# Patient Record
Sex: Male | Born: 1960
Health system: Southern US, Community
[De-identification: ages and names within clinical notes are randomized; demographics above are authoritative.]

## PROBLEM LIST (undated history)

## (undated) DIAGNOSIS — G4730 Sleep apnea, unspecified: Secondary | ICD-10-CM

## (undated) DIAGNOSIS — E559 Vitamin D deficiency, unspecified: Secondary | ICD-10-CM

## (undated) DIAGNOSIS — M1990 Unspecified osteoarthritis, unspecified site: Secondary | ICD-10-CM

## (undated) DIAGNOSIS — I10 Essential (primary) hypertension: Secondary | ICD-10-CM

## (undated) DIAGNOSIS — A770 Spotted fever due to Rickettsia rickettsii: Secondary | ICD-10-CM

## (undated) DIAGNOSIS — Z7982 Long term (current) use of aspirin: Secondary | ICD-10-CM

## (undated) DIAGNOSIS — S060XAA Concussion with loss of consciousness status unknown, initial encounter: Secondary | ICD-10-CM

## (undated) DIAGNOSIS — E538 Deficiency of other specified B group vitamins: Secondary | ICD-10-CM

## (undated) DIAGNOSIS — F419 Anxiety disorder, unspecified: Secondary | ICD-10-CM

## (undated) DIAGNOSIS — G43809 Other migraine, not intractable, without status migrainosus: Secondary | ICD-10-CM

## (undated) DIAGNOSIS — F909 Attention-deficit hyperactivity disorder, unspecified type: Secondary | ICD-10-CM

## (undated) DIAGNOSIS — I639 Cerebral infarction, unspecified: Secondary | ICD-10-CM

## (undated) DIAGNOSIS — M4800 Spinal stenosis, site unspecified: Secondary | ICD-10-CM

## (undated) DIAGNOSIS — K219 Gastro-esophageal reflux disease without esophagitis: Secondary | ICD-10-CM

## (undated) DIAGNOSIS — D510 Vitamin B12 deficiency anemia due to intrinsic factor deficiency: Secondary | ICD-10-CM

## (undated) DIAGNOSIS — I6381 Other cerebral infarction due to occlusion or stenosis of small artery: Secondary | ICD-10-CM

## (undated) DIAGNOSIS — I341 Nonrheumatic mitral (valve) prolapse: Secondary | ICD-10-CM

## (undated) HISTORY — DX: Other migraine, not intractable, without status migrainosus: G43.809

## (undated) HISTORY — DX: Attention-deficit hyperactivity disorder, unspecified type: F90.9

## (undated) HISTORY — PX: COLONOSCOPY: SHX174

## (undated) HISTORY — DX: Vitamin B12 deficiency anemia due to intrinsic factor deficiency: D51.0

## (undated) HISTORY — PX: REPAIR KNEE LIGAMENT: SUR1188

## (undated) HISTORY — PX: TRIGGER FINGER RELEASE: SHX641

## (undated) HISTORY — DX: Other cerebral infarction due to occlusion or stenosis of small artery: I63.81

## (undated) HISTORY — PX: BILATERAL CARPAL TUNNEL RELEASE: SHX6508

## (undated) HISTORY — DX: Concussion with loss of consciousness status unknown, initial encounter: S06.0XAA

## (undated) HISTORY — DX: Unspecified osteoarthritis, unspecified site: M19.90

## (undated) HISTORY — DX: Deficiency of other specified B group vitamins: E53.8

## (undated) HISTORY — DX: Vitamin D deficiency, unspecified: E55.9

## (undated) HISTORY — DX: Essential (primary) hypertension: I10

## (undated) HISTORY — PX: LEG SURGERY: SHX1003

---
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
## 1982-08-20 HISTORY — PX: FRACTURE SURGERY: SHX138

## 1982-08-20 HISTORY — PX: OTHER SURGICAL HISTORY: SHX169

## 2007-04-04 DIAGNOSIS — R030 Elevated blood-pressure reading, without diagnosis of hypertension: Secondary | ICD-10-CM | POA: Insufficient documentation

## 2007-04-08 ENCOUNTER — Ambulatory Visit: Payer: Self-pay | Admitting: Family Medicine

## 2008-08-30 ENCOUNTER — Ambulatory Visit: Payer: Self-pay | Admitting: Family Medicine

## 2008-08-30 DIAGNOSIS — G5600 Carpal tunnel syndrome, unspecified upper limb: Secondary | ICD-10-CM | POA: Insufficient documentation

## 2008-09-22 ENCOUNTER — Encounter: Payer: Self-pay | Admitting: Family Medicine

## 2008-10-18 ENCOUNTER — Encounter: Payer: Self-pay | Admitting: Family Medicine

## 2009-03-08 ENCOUNTER — Ambulatory Visit: Payer: Self-pay | Admitting: Orthopedic Surgery

## 2009-04-28 DIAGNOSIS — M2550 Pain in unspecified joint: Secondary | ICD-10-CM | POA: Insufficient documentation

## 2009-08-15 ENCOUNTER — Encounter: Payer: Self-pay | Admitting: Orthopedic Surgery

## 2009-08-20 ENCOUNTER — Encounter: Payer: Self-pay | Admitting: Orthopedic Surgery

## 2009-09-20 ENCOUNTER — Encounter: Payer: Self-pay | Admitting: Orthopedic Surgery

## 2009-10-18 ENCOUNTER — Encounter: Payer: Self-pay | Admitting: Orthopedic Surgery

## 2009-11-18 ENCOUNTER — Encounter: Payer: Self-pay | Admitting: Orthopedic Surgery

## 2009-12-28 ENCOUNTER — Ambulatory Visit: Payer: Self-pay | Admitting: Orthopedic Surgery

## 2009-12-29 ENCOUNTER — Ambulatory Visit: Payer: Self-pay | Admitting: Orthopedic Surgery

## 2010-02-01 ENCOUNTER — Encounter: Payer: Self-pay | Admitting: Physician Assistant

## 2010-02-17 ENCOUNTER — Encounter: Payer: Self-pay | Admitting: Physician Assistant

## 2010-03-20 ENCOUNTER — Encounter: Payer: Self-pay | Admitting: Physician Assistant

## 2010-04-06 ENCOUNTER — Ambulatory Visit: Payer: Self-pay | Admitting: Orthopedic Surgery

## 2010-04-20 ENCOUNTER — Encounter: Payer: Self-pay | Admitting: Physician Assistant

## 2011-11-13 ENCOUNTER — Emergency Department: Payer: Self-pay | Admitting: Emergency Medicine

## 2011-11-13 LAB — COMPREHENSIVE METABOLIC PANEL
Albumin: 4.5 g/dL (ref 3.4–5.0)
Anion Gap: 10 (ref 7–16)
Calcium, Total: 8.8 mg/dL (ref 8.5–10.1)
EGFR (Non-African Amer.): >60
Glucose: 98 mg/dL (ref 65–99)
Osmolality: 282 (ref 275–301)
Potassium: 4.1 mmol/L (ref 3.5–5.1)
SGPT (ALT): 27 U/L
Total Protein: 7.6 g/dL (ref 6.4–8.2)

## 2011-11-13 LAB — TROPONIN I: Troponin-I: <0.02 ng/mL

## 2011-11-13 LAB — CBC
HCT: 45.6 % (ref 40.0–52.0)
MCHC: 34.3 g/dL (ref 32.0–36.0)
RDW: 13.4 % (ref 11.5–14.5)
WBC: 7.3 10*3/uL (ref 3.8–10.6)

## 2012-09-22 ENCOUNTER — Emergency Department: Payer: Self-pay | Admitting: Emergency Medicine

## 2013-10-19 ENCOUNTER — Ambulatory Visit: Payer: Self-pay | Admitting: Family Medicine

## 2013-11-18 ENCOUNTER — Ambulatory Visit: Payer: Self-pay | Admitting: Otolaryngology

## 2014-01-05 DIAGNOSIS — G43109 Migraine with aura, not intractable, without status migrainosus: Secondary | ICD-10-CM | POA: Insufficient documentation

## 2014-03-01 ENCOUNTER — Ambulatory Visit: Payer: Self-pay | Admitting: Gastroenterology

## 2014-03-01 LAB — HM COLONOSCOPY

## 2015-04-08 ENCOUNTER — Ambulatory Visit: Payer: Self-pay | Admitting: Family Medicine

## 2016-08-28 ENCOUNTER — Ambulatory Visit (INDEPENDENT_AMBULATORY_CARE_PROVIDER_SITE_OTHER): Payer: BLUE CROSS/BLUE SHIELD | Admitting: Family Medicine

## 2016-08-28 ENCOUNTER — Encounter: Payer: Self-pay | Admitting: Family Medicine

## 2016-08-28 VITALS — BP 122/82 | HR 100 | Temp 98.6°F | Resp 16 | Wt 165.6 lb

## 2016-08-28 DIAGNOSIS — J111 Influenza due to unidentified influenza virus with other respiratory manifestations: Secondary | ICD-10-CM

## 2016-08-28 LAB — POC INFLUENZA A&B (BINAX/QUICKVUE)
INFLUENZA A, POC: POSITIVE — AB
INFLUENZA B, POC: NEGATIVE

## 2016-08-28 MED ORDER — HYDROCODONE-HOMATROPINE 5-1.5 MG/5ML PO SYRP: ORAL_SOLUTION | ORAL | 0 refills | Status: DC

## 2016-08-28 MED ORDER — OSELTAMIVIR PHOSPHATE 75 MG PO CAPS: 75.0000 mg | ORAL_CAPSULE | Freq: Two times a day (BID) | ORAL | 0 refills | Status: DC

## 2016-08-28 NOTE — Progress Notes (Signed)
Subjective:     Patient ID: SAHWN BODDEN, male   DOB: 1960/11/03, 56 y.o.   MRN: [id number]  HPI  Chief Complaint  Patient presents with  . Cough    Patient comes in office today with complaints of cough, congestion, body ache and fever for the past 24 hrs. Patients wife who is present with the patient states that patient had fever last night high of 102.1 and was disoriented and had difficulty with speech. Patient has been taking otc Ibuprofen and Claritin D since last night.   States prior to current sx he and his wife had mild cold sx. Sinus drainage is clear and cough is now non-productive. Did not have the flu shot this season   Review of Systems     Objective:   Physical Exam  Constitutional: He appears well-developed and well-nourished. He has a sickly appearance. No distress.  Ears: T.M's intact without inflammation Throat: no tonsillar enlargement or exudate Neck: no cervical adenopathy Lungs: clear     Assessment:    1. Flu - oseltamivir (TAMIFLU) 75 MG capsule; Take 1 capsule (75 mg total) by mouth 2 (two) times daily.  Dispense: 10 capsule; Refill: 0 - HYDROcodone-homatropine (HYCODAN) 5-1.5 MG/5ML syrup; 5 ml 4-6 hours as needed for cough  Dispense: 240 mL; Refill: 0 - POC Influenza A&B(BINAX/QUICKVUE)    Plan:    May use Mucinex D for congestion. Tamiflu in prevention dose also prescribed for his wife as she is a patient here.

## 2016-08-28 NOTE — Patient Instructions (Signed)
May use Mucinex D for congestion

## 2016-09-26 ENCOUNTER — Ambulatory Visit (INDEPENDENT_AMBULATORY_CARE_PROVIDER_SITE_OTHER): Payer: BLUE CROSS/BLUE SHIELD | Admitting: Physician Assistant

## 2016-09-26 ENCOUNTER — Ambulatory Visit
Admission: RE | Admit: 2016-09-26 | Discharge: 2016-09-26 | Disposition: A | Discharge status: Home / Self Care | Payer: BLUE CROSS/BLUE SHIELD | Source: Ambulatory Visit | Attending: Physician Assistant | Admitting: Physician Assistant

## 2016-09-26 VITALS — BP 170/80 | HR 94 | Temp 98.0°F | Resp 16 | Wt 170.0 lb

## 2016-09-26 DIAGNOSIS — R05 Cough: Secondary | ICD-10-CM

## 2016-09-26 DIAGNOSIS — R059 Cough, unspecified: Secondary | ICD-10-CM

## 2016-09-26 NOTE — Progress Notes (Signed)
[name]  MRN: [id number] DOB: Nov 10, 1960  Subjective:  HPI   The patient is a 56 year old male who presents with continued symptoms of cough and congestion.  He states that he has had these symptoms since Christmas.  During this time he also had the flu and was treated with Tamiflu.  He states he ha been using Claritin D and Mucinex.  He states he has been using a lot of the Claritin D.   He has not been running any fever but is a little winded. Wants to make sure he is not infectious towards other people.  Patient Active Problem List   Diagnosis Date Noted  . Migraine with vertigo 01/05/2014  . Arthralgia of multiple joints 04/28/2009  . Carpal tunnel syndrome 08/30/2008  . Blood pressure elevated without history of HTN 04/04/2007    No past medical history on file.  Social History   Social History  . Marital status: Married    Spouse name: N/A  . Number of children: N/A  . Years of education: N/A   Occupational History  . Not on file.   Social History Main Topics  . Smoking status: Never Smoker  . Smokeless tobacco: Never Used  . Alcohol use Not on file  . Drug use: Unknown  . Sexual activity: Not on file   Other Topics Concern  . Not on file   Social History Narrative  . No narrative on file    Outpatient Encounter Prescriptions as of 09/26/2016  Medication Sig Note  . [DISCONTINUED] HYDROcodone-homatropine (HYCODAN) 5-1.5 MG/5ML syrup 5 ml 4-6 hours as needed for cough   . [DISCONTINUED] ibuprofen (ADVIL,MOTRIN) 200 MG tablet Take by mouth. 08/28/2016: Received from: [facility]: Take 200 mg by mouth every 6 (six) hours as needed for Pain.  . [DISCONTINUED] oseltamivir (TAMIFLU) 75 MG capsule Take 1 capsule (75 mg total) by mouth 2 (two) times daily.    No facility-administered encounter medications on file as of 09/26/2016.     Not on File  Review of Systems  Constitutional: Positive for malaise/fatigue. Negative for  chills and fever.  HENT: Positive for congestion. Negative for ear discharge, ear pain, hearing loss, nosebleeds, sinus pain, sore throat and tinnitus.   Eyes: Negative for blurred vision, double vision, photophobia, pain, discharge and redness.       Eye dryness  Respiratory: Positive for cough, sputum production (clear) and shortness of breath. Negative for hemoptysis and wheezing.   Cardiovascular: Negative for chest pain, palpitations, orthopnea and leg swelling.  Gastrointestinal: Positive for constipation. Negative for abdominal pain, blood in stool, diarrhea, heartburn, melena, nausea and vomiting.  Musculoskeletal: Negative for joint pain and myalgias.  Neurological: Negative for dizziness, weakness and headaches.    Objective:  BP (!) 170/80 (BP Location: Right Arm, Patient Position: Sitting, Cuff Size: Normal)   Pulse 94   Temp 98 F (36.7 C) (Oral)   Resp 16   Wt 170 lb (77.1 kg)   SpO2 98%   Physical Exam  Constitutional: He is well-developed, well-nourished, and in no distress.  HENT:  Right Ear: Tympanic membrane and external ear normal.  Left Ear: Tympanic membrane and external ear normal.  Mouth/Throat: Oropharynx is clear and moist. No oropharyngeal exudate.  Eyes: Conjunctivae are normal. Right eye exhibits no discharge. Left eye exhibits no discharge.  Neck: Neck supple.  Cardiovascular: Normal rate, regular rhythm and normal heart sounds.  Exam reveals no friction rub.   No  murmur heard. Pulmonary/Chest: Effort normal and breath sounds normal. No respiratory distress. He has no wheezes.  Lymphadenopathy:    He has no cervical adenopathy.  Neurological: He is alert.  Skin: Skin is warm and dry.  Psychiatric: Affect and judgment normal.    Assessment and Plan :   1. Cough  Do think it's likely postinfectious, 2/2 length of symptoms will get CXR to evaluate for acute infections. Otherwise, blood pressure is high today. Please discontinue Claritin D and use  just Claritin/Mucinex if this has been helping. Plenty of fluids. May use Tessalon perles PRN.  - DG Chest 2 View; Future  Return if symptoms worsen or fail to improve.   Patient Instructions  Upper Respiratory Infection, Adult Most upper respiratory infections (URIs) are caused by a virus. A URI affects the nose, throat, and upper air passages. The most common type of URI is often called "the common cold." Follow these instructions at home:  Take medicines only as told by your doctor.  Gargle warm saltwater or take cough drops to comfort your throat as told by your doctor.  Use a warm mist humidifier or inhale steam from a shower to increase air moisture. This may make it easier to breathe.  Drink enough fluid to keep your pee (urine) clear or pale yellow.  Eat soups and other clear broths.  Have a healthy diet.  Rest as needed.  Go back to work when your fever is gone or your doctor says it is okay.  You may need to stay home longer to avoid giving your URI to others.  You can also wear a face mask and wash your hands often to prevent spread of the virus.  Use your inhaler more if you have asthma.  Do not use any tobacco products, including cigarettes, chewing tobacco, or electronic cigarettes. If you need help quitting, ask your doctor. Contact a doctor if:  You are getting worse, not better.  Your symptoms are not helped by medicine.  You have chills.  You are getting more short of breath.  You have brown or red mucus.  You have yellow or brown discharge from your nose.  You have pain in your face, especially when you bend forward.  You have a fever.  You have puffy (swollen) neck glands.  You have pain while swallowing.  You have white areas in the back of your throat. Get help right away if:  You have very bad or constant:  Headache.  Ear pain.  Pain in your forehead, behind your eyes, and over your cheekbones (sinus pain).  Chest pain.  You  have long-lasting (chronic) lung disease and any of the following:  Wheezing.  Long-lasting cough.  Coughing up blood.  A change in your usual mucus.  You have a stiff neck.  You have changes in your:  Vision.  Hearing.  Thinking.  Mood. This information is not intended to replace advice given to you by your health care provider. Make sure you discuss any questions you have with your health care provider. Document Released: 01/23/2008 Document Revised: 04/08/2016 Document Reviewed: 11/11/2013 Elsevier Interactive Patient Education  2017 [employer].    The entirety of the information documented in the History of Present Illness, Review of Systems and Physical Exam were personally obtained by me. Portions of this information were initially documented by [facility] and reviewed by me for thoroughness and accuracy.

## 2016-09-26 NOTE — Patient Instructions (Signed)
Upper Respiratory Infection, Adult Most upper respiratory infections (URIs) are caused by a virus. A URI affects the nose, throat, and upper air passages. The most common type of URI is often called "the common cold." Follow these instructions at home:  Take medicines only as told by your doctor.  Gargle warm saltwater or take cough drops to comfort your throat as told by your doctor.  Use a warm mist humidifier or inhale steam from a shower to increase air moisture. This may make it easier to breathe.  Drink enough fluid to keep your pee (urine) clear or pale yellow.  Eat soups and other clear broths.  Have a healthy diet.  Rest as needed.  Go back to work when your fever is gone or your doctor says it is okay.  You may need to stay home longer to avoid giving your URI to others.  You can also wear a face mask and wash your hands often to prevent spread of the virus.  Use your inhaler more if you have asthma.  Do not use any tobacco products, including cigarettes, chewing tobacco, or electronic cigarettes. If you need help quitting, ask your doctor. Contact a doctor if:  You are getting worse, not better.  Your symptoms are not helped by medicine.  You have chills.  You are getting more short of breath.  You have brown or red mucus.  You have yellow or brown discharge from your nose.  You have pain in your face, especially when you bend forward.  You have a fever.  You have puffy (swollen) neck glands.  You have pain while swallowing.  You have white areas in the back of your throat. Get help right away if:  You have very bad or constant:  Headache.  Ear pain.  Pain in your forehead, behind your eyes, and over your cheekbones (sinus pain).  Chest pain.  You have long-lasting (chronic) lung disease and any of the following:  Wheezing.  Long-lasting cough.  Coughing up blood.  A change in your usual mucus.  You have a stiff neck.  You have  changes in your:  Vision.  Hearing.  Thinking.  Mood. This information is not intended to replace advice given to you by your health care provider. Make sure you discuss any questions you have with your health care provider. Document Released: 01/23/2008 Document Revised: 04/08/2016 Document Reviewed: 11/11/2013 Elsevier Interactive Patient Education  2017 [employer] Inc.  

## 2016-09-27 ENCOUNTER — Telehealth: Payer: Self-pay

## 2016-09-27 NOTE — Telephone Encounter (Signed)
lmtcb Emily Drozdowski, CMA  

## 2016-09-27 NOTE — Telephone Encounter (Signed)
-----   Message from Trinna Post, [place] sent at 09/27/2016  8:25 AM EST ----- Normal CXR. Please stop taking Claritin D or any decongestant.

## 2016-09-27 NOTE — Telephone Encounter (Signed)
Pt advised. [name], CMA  

## 2016-10-24 ENCOUNTER — Ambulatory Visit (INDEPENDENT_AMBULATORY_CARE_PROVIDER_SITE_OTHER): Payer: BLUE CROSS/BLUE SHIELD | Admitting: Physician Assistant

## 2016-10-24 ENCOUNTER — Encounter: Payer: Self-pay | Admitting: Physician Assistant

## 2016-10-24 VITALS — BP 138/72 | HR 68 | Temp 98.4°F | Resp 16 | Ht 68.0 in | Wt 168.0 lb

## 2016-10-24 DIAGNOSIS — Z1211 Encounter for screening for malignant neoplasm of colon: Secondary | ICD-10-CM

## 2016-10-24 DIAGNOSIS — Z0000 Encounter for general adult medical examination without abnormal findings: Secondary | ICD-10-CM

## 2016-10-24 DIAGNOSIS — R030 Elevated blood-pressure reading, without diagnosis of hypertension: Secondary | ICD-10-CM

## 2016-10-24 NOTE — Progress Notes (Addendum)
Patient: [name], Male    DOB: [birth year], 56 y.o.   MRN: [id number] Visit Date: 10/24/2016  Today's Provider: Trinna Post, PA-C   Chief Complaint  Patient presents with  . Annual Exam   Subjective:    Annual physical exam [name] is a 56 y.o. male who presents today for health maintenance and complete physical. He feels fairly well. He reports exercising some. He reports he is sleeping well.  He lives in [place] with his wife and his daughter, who is aged 9.  He has some c/o URI congestion which he think may be allergies. Also has some dryness and blury eyes at times that resolve with sleep. Reports history of valvular prolapse, unsure which valve. Says he has sensation of "fatigue" once a month where he feels like his chest and his head are not connected appropriately, and that his head is not getting enough blood supply. This usually clears in a short while.   Has a history of ocular migraines which have resolved.  Has family history of colon polyps in his father, not sure if benign. Has no family hx of prostate cancer. Had colonoscopy in July 2015 with benign 9 mm polyp.  Has had a leg lengthening surgery remotely following traumatic incidents.   ----------------------------------------------------------------- Review of Systems  Constitutional: Negative.   HENT: Positive for congestion and sneezing.   Eyes: Positive for pain. Negative for photophobia and visual disturbance.  Respiratory: Positive for chest tightness.   Cardiovascular: Negative.   Gastrointestinal: Negative.   Endocrine: Negative.   Genitourinary: Negative.   Musculoskeletal: Positive for arthralgias, gait problem and neck pain.  Skin: Negative.   Allergic/Immunologic: Negative.   Hematological: Negative.   Psychiatric/Behavioral: Negative.     Social History      He  reports that he has never smoked. He has never used smokeless tobacco. He reports that he drinks  about 0.6 oz of alcohol per week . He reports that he does not use drugs.       Social History   Social History  . Marital status: Married    Spouse name: N/A  . Number of children: N/A  . Years of education: N/A   Social History Main Topics  . Smoking status: Never Smoker  . Smokeless tobacco: Never Used  . Alcohol use 0.6 oz/week    1 Cans of beer per week  . Drug use: No  . Sexual activity: Not Asked   Other Topics Concern  . None   Social History Narrative  . None    No past medical history on file.   Patient Active Problem List   Diagnosis Date Noted  . Migraine with vertigo 01/05/2014  . Arthralgia of multiple joints 04/28/2009  . Carpal tunnel syndrome 08/30/2008  . Blood pressure elevated without history of HTN 04/04/2007    Past Surgical History:  Procedure Laterality Date  . LEG SURGERY      Family History        Family Status  Relation Status  . Mother Alive, age 81y  . Father Deceased at age 76  . Sister Alive  . Sister Alive        His family history includes Breast cancer in his mother; CVA in his father; Heart attack in his sister; Hypertension in his sister; Lymphoma in his father; Thyroid cancer in his mother.     No Known Allergies  No current outpatient prescriptions on file.  Patient Care Team: Trinna Post, PA-C as PCP - General (Physician Assistant)      Objective:   Vitals: BP 138/72 (BP Location: Left Arm, Patient Position: Sitting, Cuff Size: Normal)   Pulse 68   Temp 98.4 F (36.9 C) (Oral)   Resp 16   Ht 5\' 8"  (1.727 m)   Wt 168 lb (76.2 kg)   BMI 25.54 kg/m    Vitals:   10/24/16 1020  BP: 138/72  Pulse: 68  Resp: 16  Temp: 98.4 F (36.9 C)  TempSrc: Oral  Weight: 168 lb (76.2 kg)  Height: 5\' 8"  (1.727 m)     Physical Exam  Constitutional: He appears well-developed and well-nourished.  HENT:  Right Ear: External ear normal.  Mouth/Throat: Oropharynx is clear and moist. No oropharyngeal exudate.      Depression Screen PHQ 2/9 Scores 10/24/2016  PHQ - 2 Score 0  PHQ- 9 Score 0      Assessment & Plan:     Routine Health Maintenance and Physical Exam  Exercise Activities and Dietary recommendations Goals    None       There is no immunization history on file for this patient.  Health Maintenance  Topic Date Due  . Hepatitis C Screening  [birth year]  . HIV Screening  06/11/1975  . TETANUS/TDAP  06/11/1979  . COLONOSCOPY  06/10/2010  . INFLUENZA VACCINE  03/20/2016     Discussed health benefits of physical activity, and encouraged him to engage in regular exercise appropriate for his age and condition.    1. Annual physical exam  Labs as below. Declines PSA today. Counseled about risks/benefits of screening. Offered referral to cardiology for faintness feeling/hx of valve prolapse, patient defers this now. Due for Td in October. May take Allegra daily for allergies.  - CBC with Differential/Platelet - Comprehensive metabolic panel - Lipid panel  2. Colon cancer screening  9 mm benign polyp on colonoscopy, repeat in 10 years.  3. Blood pressure elevated without history of HTN  BP elevated today, not in other recent visits. Counseled about diet, exercise, weight loss.  The entirety of the information documented in the History of Present Illness, Review of Systems and Physical Exam were personally obtained by me. Portions of this information were initially documented by Ashley Royalty, CMA and reviewed by me for thoroughness and accuracy.   Return in about 1 year (around 10/24/2017) for CPE; td in October nursing visit .   --------------------------------------------------------------------    Trinna Post, PA-C  [facility]

## 2016-10-24 NOTE — Patient Instructions (Signed)
 Health Maintenance, Male A healthy lifestyle and preventive care is important for your health and wellness. Ask your health care provider about what schedule of regular examinations is right for you. What should I know about weight and diet?  Eat a Healthy Diet  Eat plenty of vegetables, fruits, whole grains, low-fat dairy products, and lean protein.  Do not eat a lot of foods high in solid fats, added sugars, or salt. Maintain a Healthy Weight  Regular exercise can help you achieve or maintain a healthy weight. You should:  Do at least 150 minutes of exercise each week. The exercise should increase your heart rate and make you sweat (moderate-intensity exercise).  Do strength-training exercises at least twice a week. Watch Your Levels of Cholesterol and Blood Lipids  Have your blood tested for lipids and cholesterol every 5 years starting at 57 years of age. If you are at high risk for heart disease, you should start having your blood tested when you are 57 years old. You may need to have your cholesterol levels checked more often if:  Your lipid or cholesterol levels are high.  You are older than 57 years of age.  You are at high risk for heart disease. What should I know about cancer screening? Many types of cancers can be detected early and may often be prevented. Lung Cancer  You should be screened every year for lung cancer if:  You are a current smoker who has smoked for at least 30 years.  You are a former smoker who has quit within the past 15 years.  Talk to your health care provider about your screening options, when you should start screening, and how often you should be screened. Colorectal Cancer  Routine colorectal cancer screening usually begins at 57 years of age and should be repeated every 5-10 years until you are 57 years old. You may need to be screened more often if early forms of precancerous polyps or small growths are found. Your health care provider  may recommend screening at an earlier age if you have risk factors for colon cancer.  Your health care provider may recommend using home test kits to check for hidden blood in the stool.  A small camera at the end of a tube can be used to examine your colon (sigmoidoscopy or colonoscopy). This checks for the earliest forms of colorectal cancer. Prostate and Testicular Cancer  Depending on your age and overall health, your health care provider may do certain tests to screen for prostate and testicular cancer.  Talk to your health care provider about any symptoms or concerns you have about testicular or prostate cancer. Skin Cancer  Check your skin from head to toe regularly.  Tell your health care provider about any new moles or changes in moles, especially if:  There is a change in a mole's size, shape, or color.  You have a mole that is larger than a pencil eraser.  Always use sunscreen. Apply sunscreen liberally and repeat throughout the day.  Protect yourself by wearing long sleeves, pants, a wide-brimmed hat, and sunglasses when outside. What should I know about heart disease, diabetes, and high blood pressure?  If you are 18-39 years of age, have your blood pressure checked every 3-5 years. If you are 40 years of age or older, have your blood pressure checked every year. You should have your blood pressure measured twice-once when you are at a hospital or clinic, and once when you are not at   a hospital or clinic. Record the average of the two measurements. To check your blood pressure when you are not at a hospital or clinic, you can use:  An automated blood pressure machine at a pharmacy.  A home blood pressure monitor.  Talk to your health care provider about your target blood pressure.  If you are between 45-79 years old, ask your health care provider if you should take aspirin to prevent heart disease.  Have regular diabetes screenings by checking your fasting blood sugar  level.  If you are at a normal weight and have a low risk for diabetes, have this test once every three years after the age of 45.  If you are overweight and have a high risk for diabetes, consider being tested at a younger age or more often.  A one-time screening for abdominal aortic aneurysm (AAA) by ultrasound is recommended for men aged 65-75 years who are current or former smokers. What should I know about preventing infection? Hepatitis B  If you have a higher risk for hepatitis B, you should be screened for this virus. Talk with your health care provider to find out if you are at risk for hepatitis B infection. Hepatitis C  Blood testing is recommended for:  Everyone born from 1945 through 1965.  Anyone with known risk factors for hepatitis C. Sexually Transmitted Diseases (STDs)  You should be screened each year for STDs including gonorrhea and chlamydia if:  You are sexually active and are younger than 57 years of age.  You are older than 57 years of age and your health care provider tells you that you are at risk for this type of infection.  Your sexual activity has changed since you were last screened and you are at an increased risk for chlamydia or gonorrhea. Ask your health care provider if you are at risk.  Talk with your health care provider about whether you are at high risk of being infected with HIV. Your health care provider may recommend a prescription medicine to help prevent HIV infection. What else can I do?  Schedule regular health, dental, and eye exams.  Stay current with your vaccines (immunizations).  Do not use any tobacco products, such as cigarettes, chewing tobacco, and e-cigarettes. If you need help quitting, ask your health care provider.  Limit alcohol intake to no more than 2 drinks per day. One drink equals 12 ounces of beer, 5 ounces of wine, or 1 ounces of hard liquor.  Do not use street drugs.  Do not share needles.  Ask your health  care provider for help if you need support or information about quitting drugs.  Tell your health care provider if you often feel depressed.  Tell your health care provider if you have ever been abused or do not feel safe at home. This information is not intended to replace advice given to you by your health care provider. Make sure you discuss any questions you have with your health care provider. Document Released: 02/02/2008 Document Revised: 04/04/2016 Document Reviewed: 05/10/2015 Elsevier Interactive Patient Education  2017 [employer]vier Inc.  

## 2016-10-25 ENCOUNTER — Telehealth: Payer: Self-pay | Admitting: Physician Assistant

## 2016-10-25 NOTE — Telephone Encounter (Signed)
Pt's wife Manuela Schwartz states that pt had colonoscopy done in 2015 and is not due for a colonoscopy at this time.Report of last colonoscopy printed and sent back to you

## 2016-10-27 LAB — CBC WITH DIFFERENTIAL/PLATELET
Basophils Absolute: 0 10*3/uL (ref 0.0–0.2)
Basos: 0 %
EOS (ABSOLUTE): 0.1 10*3/uL (ref 0.0–0.4)
Eos: 2 %
Hematocrit: 46.4 % (ref 37.5–51.0)
Hemoglobin: 15.7 g/dL (ref 13.0–17.7)
Immature Grans (Abs): 0 10*3/uL (ref 0.0–0.1)
Immature Granulocytes: 0 %
Lymphocytes Absolute: 2.3 10*3/uL (ref 0.7–3.1)
Lymphs: 35 %
MCH: 30.1 pg (ref 26.6–33.0)
MCHC: 33.8 g/dL (ref 31.5–35.7)
MCV: 89 fL (ref 79–97)
Monocytes Absolute: 0.5 10*3/uL (ref 0.1–0.9)
Monocytes: 7 %
Neutrophils Absolute: 3.7 10*3/uL (ref 1.4–7.0)
Neutrophils: 56 %
Platelets: 200 10*3/uL (ref 150–379)
RBC: 5.21 x10E6/uL (ref 4.14–5.80)
RDW: 13.7 % (ref 12.3–15.4)
WBC: 6.6 10*3/uL (ref 3.4–10.8)

## 2016-10-27 LAB — COMPREHENSIVE METABOLIC PANEL
ALT: 16 IU/L (ref 0–44)
AST: 15 IU/L (ref 0–40)
Albumin/Globulin Ratio: 2 (ref 1.2–2.2)
Albumin: 4.4 g/dL (ref 3.5–5.5)
Alkaline Phosphatase: 77 IU/L (ref 39–117)
BUN/Creatinine Ratio: 20 (ref 9–20)
BUN: 17 mg/dL (ref 6–24)
Bilirubin Total: 0.8 mg/dL (ref 0.0–1.2)
CO2: 30 mmol/L — ABNORMAL HIGH (ref 18–29)
Calcium: 9.1 mg/dL (ref 8.7–10.2)
Chloride: 101 mmol/L (ref 96–106)
Creatinine, Ser: 0.84 mg/dL (ref 0.76–1.27)
GFR calc Af Amer: 113 mL/min/{1.73_m2} (ref >59)
GFR calc non Af Amer: 98 mL/min/{1.73_m2} (ref >59)
Globulin, Total: 2.2 g/dL (ref 1.5–4.5)
Glucose: 100 mg/dL — ABNORMAL HIGH (ref 65–99)
Potassium: 4.7 mmol/L (ref 3.5–5.2)
Sodium: 144 mmol/L (ref 134–144)
Total Protein: 6.6 g/dL (ref 6.0–8.5)

## 2016-10-27 LAB — LIPID PANEL
Chol/HDL Ratio: 2.7 ratio units (ref 0.0–5.0)
Cholesterol, Total: 189 mg/dL (ref 100–199)
HDL: 69 mg/dL (ref >39)
LDL Calculated: 97 mg/dL (ref 0–99)
Triglycerides: 117 mg/dL (ref 0–149)
VLDL Cholesterol Cal: 23 mg/dL (ref 5–40)

## 2016-10-30 ENCOUNTER — Telehealth: Payer: Self-pay

## 2016-10-30 ENCOUNTER — Other Ambulatory Visit: Payer: Self-pay | Admitting: Physician Assistant

## 2016-10-30 DIAGNOSIS — R7301 Impaired fasting glucose: Secondary | ICD-10-CM

## 2016-10-30 NOTE — Telephone Encounter (Signed)
Left message to call back  

## 2016-10-30 NOTE — Telephone Encounter (Signed)
Advised patient's wife as below.  

## 2016-10-30 NOTE — Progress Notes (Signed)
A1C in for follow up elevated fasting glucose.

## 2016-10-30 NOTE — Telephone Encounter (Signed)
-----   Message from Trinna Post, [place] sent at 10/30/2016 11:56 AM EDT ----- [name] normal except fasting glucose slightly high, indicating possible prediabetes. Would like to get A1c. Too late to add on, so patient will likely have to come back to lab. Doesn't need to fast for this. Orders in the computer.

## 2016-10-31 ENCOUNTER — Other Ambulatory Visit: Payer: Self-pay

## 2016-10-31 DIAGNOSIS — R7301 Impaired fasting glucose: Secondary | ICD-10-CM

## 2016-11-01 ENCOUNTER — Other Ambulatory Visit: Payer: Self-pay | Admitting: Physician Assistant

## 2016-11-01 DIAGNOSIS — R7303 Prediabetes: Secondary | ICD-10-CM

## 2016-11-01 LAB — HEMOGLOBIN A1C
Est. average glucose Bld gHb Est-mCnc: 117 mg/dL
Hgb A1c MFr Bld: 5.7 % — ABNORMAL HIGH (ref 4.8–5.6)

## 2017-06-05 ENCOUNTER — Ambulatory Visit: Payer: BLUE CROSS/BLUE SHIELD | Admitting: Physician Assistant

## 2017-06-12 ENCOUNTER — Ambulatory Visit (INDEPENDENT_AMBULATORY_CARE_PROVIDER_SITE_OTHER): Payer: BLUE CROSS/BLUE SHIELD | Admitting: Physician Assistant

## 2017-06-12 DIAGNOSIS — Z23 Encounter for immunization: Secondary | ICD-10-CM | POA: Diagnosis not present

## 2017-10-01 ENCOUNTER — Ambulatory Visit: Payer: BLUE CROSS/BLUE SHIELD | Admitting: Physician Assistant

## 2017-10-02 ENCOUNTER — Encounter: Payer: Self-pay | Admitting: Physician Assistant

## 2017-10-02 ENCOUNTER — Ambulatory Visit (INDEPENDENT_AMBULATORY_CARE_PROVIDER_SITE_OTHER): Payer: BLUE CROSS/BLUE SHIELD | Admitting: Physician Assistant

## 2017-10-02 VITALS — BP 144/88 | HR 68 | Temp 98.4°F | Resp 16 | Wt 169.0 lb

## 2017-10-02 DIAGNOSIS — R200 Anesthesia of skin: Secondary | ICD-10-CM

## 2017-10-02 DIAGNOSIS — G5603 Carpal tunnel syndrome, bilateral upper limbs: Secondary | ICD-10-CM | POA: Diagnosis not present

## 2017-10-02 NOTE — Patient Instructions (Signed)
Carpal Tunnel Syndrome Carpal tunnel syndrome is a condition that causes pain in your hand and arm. The carpal tunnel is a narrow area that is on the palm side of your wrist. Repeated wrist motion or certain diseases may cause swelling in the tunnel. This swelling can pinch the main nerve in the wrist (median nerve). Follow these instructions at home: If you have a splint:  Wear it as told by your doctor. Remove it only as told by your doctor.  Loosen the splint if your fingers: ? Become numb and tingle. ? Turn blue and cold.  Keep the splint clean and dry. General instructions  Take over-the-counter and prescription medicines only as told by your doctor.  Rest your wrist from any activity that may be causing your pain. If needed, talk to your employer about changes that can be made in your work, such as getting a wrist pad to use while typing.  If directed, apply ice to the painful area: ? Put ice in a plastic bag. ? Place a towel between your skin and the bag. ? Leave the ice on for 20 minutes, 2-3 times per day.  Keep all follow-up visits as told by your doctor. This is important.  Do any exercises as told by your doctor, physical therapist, or occupational therapist. Contact a doctor if:  You have new symptoms.  Medicine does not help your pain.  Your symptoms get worse. This information is not intended to replace advice given to you by your health care provider. Make sure you discuss any questions you have with your health care provider. Document Released: 07/26/2011 Document Revised: 01/12/2016 Document Reviewed: 12/22/2014 Elsevier Interactive Patient Education  2018 Elsevier Inc.  

## 2017-10-02 NOTE — Progress Notes (Signed)
Patient: [name] Male    DOB: [birth year]   57 y.o.   MRN: [id number] Visit Date: 10/02/2017  Today's Provider: Trinna Post, PA-C   Chief Complaint  Patient presents with  . Wrist Pain    Bilateral worse on the right; history of carpel tunnel,   Subjective:    HPI  [name] is a 57 y/o man with history of bilateral carpal tunnel syndrome presenting today for follow up of the same.   He reports many years ago he was seen by [facility] hand surgeon for numbness in hands and had EMG which showed decreased median nerve conduction, right > left. He was prescribed wrist splints which helped temporarily but do not help anymore. Has tried multiple interventions including different padding and sleeping positions as well as different manipulations all with varying degrees of success.   Today he presents with reduced grip strength in his right hand as well as increasing numbness, difficulty with pincer grasp. He does not desire surgical or procedural management of this issue. Would like to pursue physical therapy.   Also reports longstanding back pain > 10 years in his back that flares on and off. He reports some numbness from his low back and into his legs. He has been able to treat this symptomatically so far.     No Known Allergies  No current outpatient medications on file.  Review of Systems  Constitutional: Negative.   Musculoskeletal: Positive for myalgias. Negative for back pain, gait problem, joint swelling, neck pain and neck stiffness.    Social History   Tobacco Use  . Smoking status: Never Smoker  . Smokeless tobacco: Never Used  Substance Use Topics  . Alcohol use: Yes    Alcohol/week: 0.6 oz    Types: 1 Cans of beer per week   Objective:   BP (!) 144/88 (BP Location: Right Arm, Patient Position: Sitting, Cuff Size: Normal)   Pulse 68   Temp 98.4 F (36.9 C) (Oral)   Resp 16   Wt 169 lb (76.7 kg)   BMI 25.70 kg/m  Vitals:   10/02/17  1116  BP: (!) 144/88  Pulse: 68  Resp: 16  Temp: 98.4 F (36.9 C)  TempSrc: Oral  Weight: 169 lb (76.7 kg)     Physical Exam  Constitutional: He is oriented to person, place, and time. He appears well-developed and well-nourished.  Cardiovascular: Normal rate.  Pulmonary/Chest: Effort normal.  Musculoskeletal: He exhibits no edema, tenderness or deformity.       Right hand: Decreased sensation noted. Decreased sensation is present in the medial distribution. Decreased strength noted.       Left hand: Decreased sensation noted. Decreased sensation is present in the medial distribution. Decreased strength noted. He exhibits thumb/finger opposition.  Thenar eminence atrophy present in right hand.   Neurological: He is alert and oriented to person, place, and time.  Skin: Skin is warm and dry.  Psychiatric: He has a normal mood and affect. His behavior is normal.        Assessment & Plan:     1. Bilateral carpal tunnel syndrome  He does not desire surgical management of this. Can continue with wrist splints, can attend physical therapy. May refer to orthopedics if he desires.  - Ambulatory referral to Physical Therapy  2. Hand numbness  - Ambulatory referral to Physical Therapy  Return if symptoms worsen or fail to improve.  The entirety of the information documented  in the History of Present Illness, Review of Systems and Physical Exam were personally obtained by me. Portions of this information were initially documented by Ashley Royalty, CMA and reviewed by me for thoroughness and accuracy.   I have spent 25 minutes with this patient, >50% of which was spent on counseling and coordination of care.       Trinna Post, PA-C  [facility]

## 2017-10-14 ENCOUNTER — Ambulatory Visit: Payer: BLUE CROSS/BLUE SHIELD | Admitting: Occupational Therapy

## 2017-12-11 ENCOUNTER — Telehealth: Payer: Self-pay | Admitting: Physician Assistant

## 2017-12-11 NOTE — Telephone Encounter (Signed)
Patient had seen you in Feb. For hand numbness and was advised to see a physical therapist.  He has been seeing [facility].   However, he is not much better and would like to have an orthopedic referral.   He said to please get his records from [facility] as he has signed a release for you.

## 2017-12-12 ENCOUNTER — Other Ambulatory Visit: Payer: Self-pay | Admitting: Physician Assistant

## 2017-12-12 ENCOUNTER — Telehealth: Payer: Self-pay | Admitting: *Deleted

## 2017-12-12 DIAGNOSIS — G5600 Carpal tunnel syndrome, unspecified upper limb: Secondary | ICD-10-CM

## 2017-12-12 NOTE — Telephone Encounter (Signed)
Will forward to [place].  Virginia Crews, MD, MPH [facility] 12/12/2017 3:13 PM

## 2017-12-12 NOTE — Telephone Encounter (Signed)
Tried calling patient. Left message to call back. 

## 2017-12-12 NOTE — Progress Notes (Signed)
Referral to ortho for carpal tunnel placed.

## 2017-12-12 NOTE — Telephone Encounter (Signed)
Referral to ortho placed. I do not need his PT records, but the orthopedist might. He will be contacted about appointment.

## 2017-12-12 NOTE — Telephone Encounter (Signed)
[name] will see him then.

## 2017-12-12 NOTE — Telephone Encounter (Signed)
Patient's wife Manuela Schwartz returned call. Manuela Schwartz stated that patient would like to discuss referral to ortho with [name] and has scheduled an appt for 12/17/2017 11:40 am.

## 2017-12-13 ENCOUNTER — Telehealth: Payer: Self-pay | Admitting: Family Medicine

## 2017-12-13 NOTE — Telephone Encounter (Signed)
Please advise. Thanks.  

## 2017-12-13 NOTE — Telephone Encounter (Signed)
Pt states his wife called regarding his request for imaging.  States his wife was told he would need an appt for this and he feels like he doesn't need to come in as this was discussed previously with [place].  Pt states he went to physical therapy as she recommened and now feels like he needs the imaging.  I explained to the patient in most cases we need to have supporting documentation for the order to get approved by his insurance and that might by why he was asked to come in.  Pt states he would like a call back from [place] to explain if he needs to come in but he would prefer to get imaging and then come in.

## 2017-12-16 NOTE — Telephone Encounter (Signed)
There is no imaging per se for carpal tunnel, it is diagnosed by EMG which is typically done by an orthopedist. It would truthfully be easier if his orthopedist ordered any imaging because they tend to have an easier time getting through insurance and also they would be the ones to initiate treatment if there is an issue. I have placed a referral to orthopedics so I think it would make sense if he followed up with ortho and they can decide on imaging, they will order what they want. He can cancel his appointment at this clinic in that case.

## 2017-12-17 ENCOUNTER — Ambulatory Visit: Payer: BLUE CROSS/BLUE SHIELD | Admitting: Physician Assistant

## 2017-12-17 ENCOUNTER — Encounter: Payer: Self-pay | Admitting: Physician Assistant

## 2017-12-17 VITALS — BP 138/86 | HR 64 | Temp 97.8°F | Resp 16 | Wt 170.0 lb

## 2017-12-17 DIAGNOSIS — G8929 Other chronic pain: Secondary | ICD-10-CM | POA: Diagnosis not present

## 2017-12-17 DIAGNOSIS — M25512 Pain in left shoulder: Secondary | ICD-10-CM | POA: Diagnosis not present

## 2017-12-17 DIAGNOSIS — G540 Brachial plexus disorders: Secondary | ICD-10-CM

## 2017-12-17 DIAGNOSIS — M25511 Pain in right shoulder: Secondary | ICD-10-CM

## 2017-12-17 NOTE — Patient Instructions (Signed)
Shoulder Pain Many things can cause shoulder pain, including:  An injury.  Moving the arm in the same way again and again (overuse).  Joint pain (arthritis).  Follow these instructions at home: Take these actions to help with your pain:  Squeeze a soft ball or a foam pad as much as you can. This helps to prevent swelling. It also makes the arm stronger.  Take over-the-counter and prescription medicines only as told by your doctor.  If told, put ice on the area: ? Put ice in a plastic bag. ? Place a towel between your skin and the bag. ? Leave the ice on for 20 minutes, 2-3 times per day. Stop putting on ice if it does not help with the pain.  If you were given a shoulder sling or immobilizer: ? Wear it as told. ? Remove it to shower or bathe. ? Move your arm as little as possible. ? Keep your hand moving. This helps prevent swelling.  Contact a doctor if:  Your pain gets worse.  Medicine does not help your pain.  You have new pain in your arm, hand, or fingers. Get help right away if:  Your arm, hand, or fingers: ? Tingle. ? Are numb. ? Are swollen. ? Are painful. ? Turn white or blue. This information is not intended to replace advice given to you by your health care provider. Make sure you discuss any questions you have with your health care provider. Document Released: 01/23/2008 Document Revised: 04/01/2016 Document Reviewed: 11/29/2014 Elsevier Interactive Patient Education  2018 Elsevier Inc.  

## 2017-12-17 NOTE — Progress Notes (Signed)
       Patient: [name] Male    DOB: 07-19-1961   57 y.o.   MRN: [id number] Visit Date: 12/17/2017  Today's Provider: Trinna Post, PA-C   Chief Complaint  Patient presents with  . Hand Pain    Bilateral; right worse than left.   Subjective:    HPI   [name] is a 57 y/o man with history of carpal tunnel who presents today for continued bilateral shoulder pain that radiates down his arms and into his hands. He has a history of bilateral carpal tunnel syndrome that he reports was diagnosed via EMG by Dr. Jarold Song at [facility].   [facility] has been [facility] for [facility] tunnel which was not overly helpful. He is more concerned about his shoulder symptoms now.      No Known Allergies  No current outpatient medications on file.  Review of Systems  Constitutional: Negative.   Musculoskeletal: Positive for arthralgias and myalgias. Negative for back pain, gait problem, joint swelling, neck pain and neck stiffness.    Social History   Tobacco Use  . Smoking status: Never Smoker  . Smokeless tobacco: Never Used  Substance Use Topics  . Alcohol use: Yes    Alcohol/week: 0.6 oz    Types: 1 Cans of beer per week   Objective:   BP 138/86 (BP Location: Right Arm, Patient Position: Sitting, Cuff Size: Normal)   Pulse 64   Temp 97.8 F (36.6 C) (Oral)   Resp 16   Wt 170 lb (77.1 kg)   BMI 25.85 kg/m  Vitals:   12/17/17 1152  BP: 138/86  Pulse: 64  Resp: 16  Temp: 97.8 F (36.6 C)  TempSrc: Oral  Weight: 170 lb (77.1 kg)     Physical Exam  Constitutional: He is oriented to person, place, and time. He appears well-developed and well-nourished.  Cardiovascular: Normal rate and regular rhythm.  Pulmonary/Chest: Effort normal and breath sounds normal.  Musculoskeletal: He exhibits no edema, tenderness or deformity.  Neurological: He is alert and oriented to person, place, and time.  Skin: Skin is warm and dry.  Psychiatric: He has a normal  mood and affect. His behavior is normal.        Assessment & Plan:     1. Chronic pain of both shoulders  He would like new referral to PT which is in [place], [place].   - Ambulatory referral to Physical Therapy - Ambulatory referral to Orthopedics  2. Thoracic outlet syndrome  - Ambulatory referral to Orthopedics  Return if symptoms worsen or fail to improve.  The entirety of the information documented in the History of Present Illness, Review of Systems and Physical Exam were personally obtained by me. Portions of this information were initially documented by Ashley Royalty, CMA and reviewed by me for thoroughness and accuracy.         Trinna Post, PA-C  [facility]

## 2018-01-01 ENCOUNTER — Telehealth: Payer: Self-pay | Admitting: Family Medicine

## 2018-01-01 DIAGNOSIS — Z1379 Encounter for other screening for genetic and chromosomal anomalies: Secondary | ICD-10-CM

## 2018-01-01 NOTE — Telephone Encounter (Signed)
Will refer to genetic counselor to arrange this.

## 2018-01-01 NOTE — Telephone Encounter (Signed)
It seems you see this pt. Please review.

## 2018-01-01 NOTE — Telephone Encounter (Signed)
Pt's wife Manuela Schwartz is requesting BRCA 1 & 2 be ordered for pt due to family history. Please advise. Thanks TNP

## 2018-01-03 ENCOUNTER — Telehealth: Payer: Self-pay | Admitting: Physician Assistant

## 2018-01-03 NOTE — Telephone Encounter (Signed)
I have reviewed the family tree that this patient's wife has dropped off. I will only order test with an office visit discussing this and going over the follow up maintenance for this. The patient will have to agree to undergo recommended follow up screening if positive, which would include yearly prostate exam (blood work and digital rectal exam) and yearly mammograms for me to order this test. The patient will also need to sign a waiver saying he is responsible for this test in full if insurance does not cover this. I will not order this testing without this. I will also not answer any more questions over the phone, everything else may be discussed in the office visit.

## 2018-01-03 NOTE — Telephone Encounter (Signed)
It is very unusual to test a male for BRCA gene. I would like him to see a genetic counselor to establish his risk and necessity of the test. I do not feel comfortable ordering this test for him and a genetic counselor will be better to establish risk and counsel on the involved follow up if testing is positive.

## 2018-01-03 NOTE — Telephone Encounter (Signed)
Manuela Schwartz, pt wife, does not think its necessary to go to genetic counseling.   The daughter had this test done through Dr. Brita Romp and doesn't understand why the test (BRCA 1 & 2) cannot be ordered here at this facility.

## 2018-01-03 NOTE — Telephone Encounter (Signed)
Tried to advise wife as below. She reports that this test is NOT unusual for men and she will not pay extra money to see a genetic counselor when we could just order the test. She reports that she will drop off the patient's medical history and information about the test so [name] can review.

## 2018-01-03 NOTE — Telephone Encounter (Signed)
Advised patient's wife as below. She reports that she will check their insurance to see if this will be covered. Then they will call and schedule an appt.

## 2018-04-02 ENCOUNTER — Encounter: Payer: Self-pay | Admitting: Physician Assistant

## 2018-04-30 ENCOUNTER — Ambulatory Visit (INDEPENDENT_AMBULATORY_CARE_PROVIDER_SITE_OTHER): Payer: BLUE CROSS/BLUE SHIELD | Admitting: Family Medicine

## 2018-04-30 ENCOUNTER — Other Ambulatory Visit: Payer: Self-pay

## 2018-04-30 ENCOUNTER — Encounter: Payer: Self-pay | Admitting: Family Medicine

## 2018-04-30 ENCOUNTER — Other Ambulatory Visit: Payer: Self-pay | Admitting: Family Medicine

## 2018-04-30 VITALS — BP 136/86 | HR 66 | Temp 97.8°F | Resp 16 | Wt 166.6 lb

## 2018-04-30 DIAGNOSIS — Z111 Encounter for screening for respiratory tuberculosis: Secondary | ICD-10-CM

## 2018-04-30 DIAGNOSIS — Z021 Encounter for pre-employment examination: Secondary | ICD-10-CM | POA: Diagnosis not present

## 2018-04-30 DIAGNOSIS — Z125 Encounter for screening for malignant neoplasm of prostate: Secondary | ICD-10-CM

## 2018-04-30 NOTE — Patient Instructions (Signed)
We will call you with the TB test results. Please bring form by so I can fill out for you.

## 2018-04-30 NOTE — Progress Notes (Addendum)
  Subjective:     Patient ID: [name], male   DOB: Mar 21, 1961, 57 y.o.   MRN: [id number] Chief Complaint  Patient presents with  . Employment Physical    Patient comes in office today for his annual physical, he states that he feels well today and has no concerns to adress. Patient reports following a well balanced diet, he states that he is not actively exercising and is sleeping on average of 8hrs a night. 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 Will need TB screening. Defers other labs. Currently being evaluated by orthopedics for upper extremity paresthesias. Defers flu shot today.  Review of Systems     Objective:   Physical Exam  Constitutional: He appears well-developed and well-nourished. No distress.  HENT:  Right Ear: External ear normal.  Left Ear: External ear normal.  Mouth/Throat: Oropharynx is clear and moist.  TM's intact without inflammation  Eyes: Pupils are equal, round, and reactive to light. EOM are normal.  Cardiovascular: Normal rate and regular rhythm.  Pulmonary/Chest: Breath sounds normal.  Musculoskeletal:  Grip strength 5/5       Assessment:    1. Pre-employment examination  2. Screening for tuberculosis - QuantiFERON-TB Gold Plus    Plan:    Further f/u for form completion and test results.

## 2018-05-01 LAB — PSA: PROSTATE SPECIFIC AG, SERUM: 0.8 ng/mL (ref 0.0–4.0)

## 2018-05-02 ENCOUNTER — Telehealth: Payer: Self-pay

## 2018-05-02 NOTE — Telephone Encounter (Signed)
-----   Message from Carmon Ginsberg, [place] sent at 05/01/2018  7:23 AM EDT ----- Prostate ok. TB gold test is pending

## 2018-05-02 NOTE — Telephone Encounter (Signed)
Wife has been advised. KW 

## 2018-05-05 ENCOUNTER — Telehealth: Payer: Self-pay | Admitting: Family Medicine

## 2018-05-05 LAB — QUANTIFERON-TB GOLD PLUS
QUANTIFERON MITOGEN VALUE: 9.94 [IU]/mL
QUANTIFERON NIL VALUE: 0.03 [IU]/mL
QuantiFERON TB1 Ag Value: 0.04 IU/mL
QuantiFERON TB2 Ag Value: 0.03 IU/mL
QuantiFERON-TB Gold Plus: NEGATIVE

## 2018-05-05 NOTE — Telephone Encounter (Signed)
Pt's wife called wanting to know if we have her husbands TB test results back from last week and if his form for his employer is ready.  Their call back is  [phone]  Thanks  Con Memos

## 2018-05-05 NOTE — Telephone Encounter (Signed)
-----   Message from Carmon Ginsberg, [place] sent at 05/05/2018  7:21 AM EDT ----- TB test ok. Please attach copy to exam form and have patient pickup

## 2018-05-05 NOTE — Telephone Encounter (Signed)
Unable to leave wife voicemail message since voicemail is not set up, will try and reaching her or patient again this afternoon. KW

## 2018-05-05 NOTE — Telephone Encounter (Signed)
Wife has been advised and will pick up form. KW

## 2018-05-15 ENCOUNTER — Ambulatory Visit (INDEPENDENT_AMBULATORY_CARE_PROVIDER_SITE_OTHER): Payer: BLUE CROSS/BLUE SHIELD | Admitting: Family Medicine

## 2018-05-15 ENCOUNTER — Encounter: Payer: Self-pay | Admitting: Family Medicine

## 2018-05-15 VITALS — BP 150/100 | HR 66 | Temp 98.0°F | Resp 16 | Ht 68.0 in | Wt 172.2 lb

## 2018-05-15 DIAGNOSIS — R202 Paresthesia of skin: Secondary | ICD-10-CM | POA: Diagnosis not present

## 2018-05-15 NOTE — Progress Notes (Signed)
Patient: [name] Male    DOB: 1961-01-05   57 y.o.   MRN: [id number] Visit Date: 05/15/2018  Today's Provider: Lavon Paganini, MD   Chief Complaint  Patient presents with  . Prostate Check   Subjective:    HPI Patient here today C/O possible prostatitis since yesterday. Patient reports he had severe diarrhea since Sunday. Patient reports diarrhea is now controlled. Patient reports he is urinating well.   Denies any fevers, dysuria, urinary frequency/urgency, abdominal pain, lumbar back pain, urinary or fecal incontinence, he urinary retention, decreased sensation in the perineum  Reports buzzing/vibratory sensation that lasts for ~3-5 seconds and then is gone for about 3 to 5 seconds.  This is been present consistently for the past 24 hours.  He did a Producer, television/film/video and found that other people of had this similar sensation and that it could be prostatitis.  He states that his wife was very concerned about prostatitis and made him come in for an appointment today.  This sensation is located in his right groin/perineum.  He states that he has had this months ago as well.  It did not last as long at that time.    No Known Allergies   Current Outpatient Medications:  .  aspirin EC 81 MG tablet, Take by mouth., Disp: , Rfl:   Review of Systems  Constitutional: Negative.   Respiratory: Negative.   Cardiovascular: Negative.   Genitourinary: Negative for difficulty urinating.  Musculoskeletal: Negative.     Social History   Tobacco Use  . Smoking status: Never Smoker  . Smokeless tobacco: Never Used  Substance Use Topics  . Alcohol use: Yes    Alcohol/week: 1.0 standard drinks    Types: 1 Cans of beer per week   Objective:   BP (!) 150/100 (BP Location: Left Arm, Patient Position: Sitting, Cuff Size: Normal)   Pulse 66   Temp 98 F (36.7 C) (Oral)   Resp 16   Ht 5\' 8"  (1.727 m)   Wt 172 lb 3.2 oz (78.1 kg)   SpO2 98%   BMI 26.18 kg/m  Vitals:   05/15/18 1617  BP: (!) 150/100  Pulse: 66  Resp: 16  Temp: 98 F (36.7 C)  TempSrc: Oral  SpO2: 98%  Weight: 172 lb 3.2 oz (78.1 kg)  Height: 5\' 8"  (1.727 m)     Physical Exam  Constitutional: He is oriented to person, place, and time. He appears well-developed and well-nourished. No distress.  HENT:  Head: Normocephalic and atraumatic.  Eyes: Conjunctivae are normal. No scleral icterus.  Neck: Neck supple. No thyromegaly present.  Cardiovascular: Normal rate, regular rhythm, normal heart sounds and intact distal pulses.  No murmur heard. Pulmonary/Chest: Effort normal and breath sounds normal. No respiratory distress. He has no wheezes. He has no rales.  Abdominal: Soft. He exhibits no distension. There is no tenderness.  Genitourinary: Rectum normal, prostate normal, testes normal and penis normal. Rectal exam shows no internal hemorrhoid and anal tone normal. Prostate is not tender. No penile tenderness. No discharge found.  Musculoskeletal: He exhibits no edema.  Back: No midline spinous process tenderness to palpation.  No paraspinal muscular tenderness.  No SI joint tenderness.  Range of motion is intact.  Strength and sensation in lower extremities is intact.  Neurological: He is alert and oriented to person, place, and time.  Skin: Skin is warm and dry. Capillary refill takes less than 2 seconds. No rash noted.  Psychiatric:  He has a normal mood and affect. His behavior is normal.  Vitals reviewed.      Assessment & Plan:   1. Tingling sensation -Recurrent problem - Unclear etiology -No abnormalities on exam -Reassured patient that this is not related to prostatitis and he does not have any symptoms of prostatitis - No back pain, decreased rectal tone, perineal numbness, or urinary/fecal incontinence/retention to suggest cauda equina syndrome - Will refer to urology for further evaluation - Ambulatory referral to Urology    Return if symptoms worsen or fail to  improve.   Approximately 25 minutes was spent in discussion of which greater than 50% was consultation.    The entirety of the information documented in the History of Present Illness, Review of Systems and Physical Exam were personally obtained by me. Portions of this information were initially documented by Lynford Humphrey, CMA and reviewed by me for thoroughness and accuracy.    Virginia Crews, MD, MPH [facility] 05/16/2018 5:12 PM

## 2018-05-21 ENCOUNTER — Ambulatory Visit: Payer: BLUE CROSS/BLUE SHIELD | Admitting: Physician Assistant

## 2018-05-21 ENCOUNTER — Encounter: Payer: Self-pay | Admitting: Physician Assistant

## 2018-05-21 ENCOUNTER — Ambulatory Visit: Payer: BLUE CROSS/BLUE SHIELD | Admitting: Urology

## 2018-05-21 VITALS — BP 158/108 | HR 66 | Temp 97.8°F | Wt 170.4 lb

## 2018-05-21 DIAGNOSIS — I10 Essential (primary) hypertension: Secondary | ICD-10-CM | POA: Diagnosis not present

## 2018-05-21 DIAGNOSIS — R911 Solitary pulmonary nodule: Secondary | ICD-10-CM | POA: Diagnosis not present

## 2018-05-21 DIAGNOSIS — R918 Other nonspecific abnormal finding of lung field: Secondary | ICD-10-CM | POA: Diagnosis not present

## 2018-05-21 NOTE — Progress Notes (Signed)
Patient: [name] Male    DOB: August 09, 1961   57 y.o.   MRN: [id number] Visit Date: 05/22/2018  Today's Provider: Trinna Post, PA-C   Chief Complaint  Patient presents with  . Referral for CT chest   Subjective:    HPI   Patient presents today to request a CT chest for further evaluation. Patient reports he had a MRI of his C-spine done on 05/12/2018. A lung lesion was seen and radiologist recommended a CT chest for further evaluation. The patient reports he received the image at a [facility] imaging center, however, the results are not visible in [place]. He presents with a copy of the MRI report which shows a "nonspecific 2.4 x 1.7cm lesion in the posterior left mid upper lung only seen on the scout images. Recommend correlation with any recent chest imaging or consider CT chest for further evaluation.   He denies history of smoking. He has never worked in a factory. Denies SOB or chest pain. Denies cough.   His blood pressure has been elevated in two of his last visits, and also shows borderline HTN prior to this. Patient adamantly does not want to start medications for this and would like to try diet and exercise to improve this. He thinks a lot of this is related to stress. He denies chest pain, headache, SOB.  BP Readings from Last 3 Encounters:  05/21/18 (!) 158/108  05/15/18 (!) 150/100  04/30/18 136/86       No Known Allergies   Current Outpatient Medications:  .  aspirin EC 81 MG tablet, Take by mouth., Disp: , Rfl:   Review of Systems   ROS negative except for pertinent HPI.   Family History  Problem Relation Age of Onset  . Breast cancer Mother   . Thyroid cancer Mother   . Lymphoma Father   . CVA Father   . Heart attack Sister   . Hypertension Sister    Past Surgical History:  Procedure Laterality Date  . LEG SURGERY      Social History   Tobacco Use  . Smoking status: Never Smoker  . Smokeless tobacco: Never Used  Substance  Use Topics  . Alcohol use: Yes    Alcohol/week: 1.0 standard drinks    Types: 1 Cans of beer per week   Objective:   BP (!) 158/108 (BP Location: Right Arm, Patient Position: Sitting, Cuff Size: Normal)   Pulse 66   Temp 97.8 F (36.6 C) (Oral)   Wt 170 lb 6.4 oz (77.3 kg)   SpO2 99%   BMI 25.91 kg/m  Vitals:   05/21/18 1125  BP: (!) 158/108  Pulse: 66  Temp: 97.8 F (36.6 C)  TempSrc: Oral  SpO2: 99%  Weight: 170 lb 6.4 oz (77.3 kg)     Physical Exam  Constitutional: He is oriented to person, place, and time. He appears well-developed and well-nourished.  Cardiovascular: Normal rate and regular rhythm.  Pulmonary/Chest: Effort normal and breath sounds normal.  Neurological: He is alert and oriented to person, place, and time.  Skin: Skin is warm and dry.  Psychiatric: He has a normal mood and affect. His behavior is normal.        Assessment & Plan:     1. Lung mass  Incidental finding on C-spine MRI at outside imaging facility. Will order CT chest as below. Instructed patient to bring CD with MRI image to the imaging center can pull it  for our radiologists to compare.   2. Solitary pulmonary nodule  - CT Chest Wo Contrast; Future  3. Hypertension, unspecified type  Past two blood pressure readings in office were hypertensive. Patient does not wish to start medications, wants to exercise. Will see him in clinic in 3 months to check blood pressure.   Return in about 3 months (around 08/21/2018).  The entirety of the information documented in the History of Present Illness, Review of Systems and Physical Exam were personally obtained by me. Portions of this information were initially documented by Tiburcio Pea, CMA and reviewed by me for thoroughness and accuracy.           Trinna Post, PA-C  [facility]

## 2018-05-22 NOTE — Patient Instructions (Signed)
Pulmonary Nodule A pulmonary nodule is a small, round spot in your lung. It is usually found when pictures of your lungs are taken for other reasons. Most pulmonary nodules are not cancerous and do not cause symptoms. Tests will be done to make sure the nodule is not cancerous. Pulmonary nodules that are not cancerous usually do not require treatment. Follow these instructions at home:  Only take medicine as told by your doctor.  Follow up with your doctor as told. Contact a doctor if:  You have trouble breathing when doing activities.  You feel sick or more tired than normal.  You do not feel like eating.  You lose weight without trying to.  You have chills.  You have night sweats. Get help right away if:  You cannot catch your breath.  You start making whistling sounds when breathing (wheezing).  You have a cough that does not go away.  You cough up blood.  You are dizzy or feel like you are going to pass out.  You have sudden chest pain.  You have a fever or lasting symptoms for more than 2-3 days.  You have a fever and your symptoms suddenly get worse. This information is not intended to replace advice given to you by your health care provider. Make sure you discuss any questions you have with your health care provider. Document Released: 09/08/2010 Document Revised: 01/12/2016 Document Reviewed: 01/26/2013 Elsevier Interactive Patient Education  2017 Elsevier Inc.  

## 2018-05-23 ENCOUNTER — Telehealth: Payer: Self-pay | Admitting: Family Medicine

## 2018-05-23 NOTE — Telephone Encounter (Signed)
Copied from [place] [phone]. Topic: Referral - Request >> May 22, 2018  9:10 AM Parke Poisson wrote: Reason for CRM: Pt's insurance company would like to speak peer to peer to get MRI of chest approved.Phone [phone].Member # [id number]

## 2018-05-26 ENCOUNTER — Telehealth: Payer: Self-pay | Admitting: Family Medicine

## 2018-05-26 NOTE — Telephone Encounter (Signed)
Wife is checking in [name] has had a chance to have a per to per with [employer] regarding a  Cat scan?  Please advise.  Thanks, [employer]

## 2018-05-26 NOTE — Telephone Encounter (Signed)
Yes I have completed it the authorization # is [id number] and is valid from 05/22/2018 - 06/20/2018.

## 2018-05-26 NOTE — Telephone Encounter (Signed)
Will forward to [place] who ordered CT chest.  Brita Romp Dionne Bucy, MD, MPH [facility] 05/26/2018 9:00 AM

## 2018-05-26 NOTE — Telephone Encounter (Signed)
Per pt's wife [name] pt would like to get CT done at [facility].Please fax order to [phone] along with demographics.Their phone number is [phone]

## 2018-05-27 ENCOUNTER — Ambulatory Visit: Payer: BLUE CROSS/BLUE SHIELD | Admitting: Family Medicine

## 2018-05-27 NOTE — Telephone Encounter (Signed)
Please see other phone note on this same issue.

## 2018-05-27 NOTE — Telephone Encounter (Signed)
Order printed. Will be faxed to [facility]. They should contact patient with appointment. I will not be able to see the image itself, I typically get faxed the report. If they want that radiologist to have the previous image, they should bring their MRI image with them to the center.

## 2018-05-28 ENCOUNTER — Ambulatory Visit: Payer: BLUE CROSS/BLUE SHIELD | Admitting: Family Medicine

## 2018-05-29 ENCOUNTER — Encounter: Payer: Self-pay | Admitting: Physician Assistant

## 2018-05-30 ENCOUNTER — Telehealth: Payer: Self-pay | Admitting: Physician Assistant

## 2018-05-30 NOTE — Telephone Encounter (Signed)
Please call pt's wife back ASAP. Really needing to speak with nurse regarding pt - husband results.  Thanks, [employer]

## 2018-05-30 NOTE — Telephone Encounter (Signed)
I have reviewed chest CT. Some mild scarring in right lung lobe, but otherwise clear. There are NO masses seen on this image, signed and will be scanned into records. Please call patient and let him know.

## 2018-05-30 NOTE — Telephone Encounter (Signed)
Tried calling patient. Left message to call back. 

## 2018-05-30 NOTE — Telephone Encounter (Signed)
[name] came in for the report. She wants to know how do radiologist tell between scarring and masses. She is also requesting a copy of the report.

## 2018-06-02 ENCOUNTER — Telehealth: Payer: Self-pay | Admitting: Family Medicine

## 2018-06-02 NOTE — Telephone Encounter (Signed)
Since it was done at outside health system, he will need to request records from there.  Virginia Crews, MD, MPH [facility] 06/02/2018 2:10 PM

## 2018-06-02 NOTE — Telephone Encounter (Signed)
Patient obtained imaging at [facility] facility which is not in our health system, so we are unable to provide results. They need to contact [facility] facility to get this. Radiologists make assessments of imaging based on the distribution of the findings, how dense structures appear on imaging, clinical data and many other factors they are trained on during their residency. I do not know what in particular went into this assessment, they should call the facility if they would like a more in depth answer.

## 2018-06-02 NOTE — Telephone Encounter (Signed)
Patient's wife Manuela Schwartz advised.

## 2018-06-02 NOTE — Telephone Encounter (Signed)
Pt's wife called wanting to see a copy of the report for the lung CT scan.  She said she wants to pick up this report (paper copy) this afternoon.  Pt's Call back is [phone]  Thanks Con Memos

## 2018-06-03 NOTE — Telephone Encounter (Signed)
Patient advised as below.  

## 2018-06-19 DIAGNOSIS — Z713 Dietary counseling and surveillance: Secondary | ICD-10-CM | POA: Insufficient documentation

## 2018-08-26 ENCOUNTER — Ambulatory Visit (INDEPENDENT_AMBULATORY_CARE_PROVIDER_SITE_OTHER): Payer: PRIVATE HEALTH INSURANCE | Admitting: Physician Assistant

## 2018-08-26 ENCOUNTER — Encounter: Payer: Self-pay | Admitting: Physician Assistant

## 2018-08-26 VITALS — BP 163/95 | HR 67 | Temp 97.8°F | Resp 16 | Wt 169.0 lb

## 2018-08-26 DIAGNOSIS — T8131XA Disruption of external operation (surgical) wound, not elsewhere classified, initial encounter: Secondary | ICD-10-CM

## 2018-08-26 DIAGNOSIS — I10 Essential (primary) hypertension: Secondary | ICD-10-CM

## 2018-08-26 NOTE — Patient Instructions (Addendum)
YOU HAVE HIGH BLOOD PRESSURE. THIS INCREASES YOUR RISK OF HEART ATTACK, STROKE, AND DEMENTIA.   Hypertension Hypertension is another name for high blood pressure. High blood pressure forces your heart to work harder to pump blood. This can cause problems over time. There are two numbers in a blood pressure reading. There is a top number (systolic) over a bottom number (diastolic). It is best to have a blood pressure below 120/80. Healthy choices can help lower your blood pressure. You may need medicine to help lower your blood pressure if:  Your blood pressure cannot be lowered with healthy choices.  Your blood pressure is higher than 130/80. Follow these instructions at home: Eating and drinking   If directed, follow the DASH eating plan. This diet includes: ? Filling half of your plate at each meal with fruits and vegetables. ? Filling one quarter of your plate at each meal with whole grains. Whole grains include whole wheat pasta, brown rice, and whole grain bread. ? Eating or drinking low-fat dairy products, such as skim milk or low-fat yogurt. ? Filling one quarter of your plate at each meal with low-fat (lean) proteins. Low-fat proteins include fish, skinless chicken, eggs, beans, and tofu. ? Avoiding fatty meat, cured and processed meat, or chicken with skin. ? Avoiding premade or processed food.  Eat less than 1,500 mg of salt (sodium) a day.  Limit alcohol use to no more than 1 drink a day for nonpregnant women and 2 drinks a day for men. One drink equals 12 oz of beer, 5 oz of wine, or 1 oz of hard liquor. Lifestyle  Work with your doctor to stay at a healthy weight or to lose weight. Ask your doctor what the best weight is for you.  Get at least 30 minutes of exercise that causes your heart to beat faster (aerobic exercise) most days of the week. This may include walking, swimming, or biking.  Get at least 30 minutes of exercise that strengthens your muscles (resistance  exercise) at least 3 days a week. This may include lifting weights or pilates.  Do not use any products that contain nicotine or tobacco. This includes cigarettes and e-cigarettes. If you need help quitting, ask your doctor.  Check your blood pressure at home as told by your doctor.  Keep all follow-up visits as told by your doctor. This is important. Medicines  Take over-the-counter and prescription medicines only as told by your doctor. Follow directions carefully.  Do not skip doses of blood pressure medicine. The medicine does not work as well if you skip doses. Skipping doses also puts you at risk for problems.  Ask your doctor about side effects or reactions to medicines that you should watch for. Contact a doctor if:  You think you are having a reaction to the medicine you are taking.  You have headaches that keep coming back (recurring).  You feel dizzy.  You have swelling in your ankles.  You have trouble with your vision. Get help right away if:  You get a very bad headache.  You start to feel confused.  You feel weak or numb.  You feel faint.  You get very bad pain in your: ? Chest. ? Belly (abdomen).  You throw up (vomit) more than once.  You have trouble breathing. Summary  Hypertension is another name for high blood pressure.  Making healthy choices can help lower blood pressure. If your blood pressure cannot be controlled with healthy choices, you may need to  take medicine. This information is not intended to replace advice given to you by your health care provider. Make sure you discuss any questions you have with your health care provider. Document Released: 01/23/2008 Document Revised: 07/04/2016 Document Reviewed: 07/04/2016 Elsevier Interactive Patient Education  2019 [employer].

## 2018-08-26 NOTE — Progress Notes (Signed)
Patient: [name] Male    DOB: 08/28/1960   58 y.o.   MRN: [id number] Visit Date: 08/26/2018  Today's Provider: Trinna Post, PA-C   Chief Complaint  Patient presents with  . Follow-up   Subjective:     HPI   Follow up for elevated bp  The patient was last seen for this 3 months ago. Changes made at last visit include monitor bp. Patient reports he does not check bp at home. Patient denies sob, chest pain or swelling. He declines medication for this. He would like to take one year to exercise and get his blood pressure down. He reports he did not start exercise regimen within the past three months due to increased stress.  Has recently had bilateral carpal tunnel syndrome surgery over the holidays with Dr. Peggye Ley at [facility]. He is recovering from this. He has questions about his right incision.   BP Readings from Last 3 Encounters:  08/26/18 (!) 163/95  05/21/18 (!) 158/108  05/15/18 (!) 150/100    ------------------------------------------------------------------------------------    No Known Allergies   Current Outpatient Medications:  .  aspirin EC 81 MG tablet, Take by mouth., Disp: , Rfl:   Review of Systems  Constitutional: Negative.   Cardiovascular: Negative.     Social History   Tobacco Use  . Smoking status: Never Smoker  . Smokeless tobacco: Never Used  Substance Use Topics  . Alcohol use: Yes    Alcohol/week: 1.0 standard drinks    Types: 1 Cans of beer per week      Objective:   BP (!) 163/95 (BP Location: Left Arm, Patient Position: Sitting, Cuff Size: Normal)   Pulse 67   Temp 97.8 F (36.6 C) (Oral)   Resp 16   Wt 169 lb (76.7 kg)   BMI 25.70 kg/m  Vitals:   08/26/18 1407  BP: (!) 163/95  Pulse: 67  Resp: 16  Temp: 97.8 F (36.6 C)  TempSrc: Oral  Weight: 169 lb (76.7 kg)     Physical Exam Constitutional:      Appearance: Normal appearance.  Cardiovascular:     Rate and Rhythm: Normal rate and  regular rhythm.     Heart sounds: Normal heart sounds.  Pulmonary:     Effort: Pulmonary effort is normal.     Breath sounds: Normal breath sounds.  Musculoskeletal:       Hands:  Skin:    General: Skin is warm and dry.  Neurological:     General: No focal deficit present.     Mental Status: He is alert. Mental status is at baseline.  Psychiatric:        Mood and Affect: Mood normal.        Behavior: Behavior normal.         Assessment & Plan    1. Essential hypertension  Patient wants to come back in one year to check his blood pressure. He has been educated on the risks of uncontrolled hypertension including increased risk of heart attack, stroke, dementia, and heart failure. He is accepting of these risks and continues to decline medication. He says he needs to implement a workout plan and needs to campaign the support of his family to give him two uninterrupted hours a day to exercise, especially hiking. He also plans to meditate. He cites multiple reasons for not taking medication including being unsure about whether he truly has HTN, which I have told him he certainly  does. He reports now that he knows he has it, he is still unmotivated to take medication daily as he would forget this. I have suggested an application on a phone to set alarm or a watch, but he cites lack of waterproof watches. He says he really needs to get a "taste of the forest fires" before he will be motivated to act. I have pointed out that he does have a doctorate degree and if he dedicated 6 years of diligent studying to obtain this degree it is certainly well within his capabilities to take a pill every night. He does acknowledge this in good humor, though remains unmotivated to do so. He would like to bring his BP cuff in to have it checked against ours for accuracy. Will set him up with nurse visit. Otherwise, f/u one year.   2. Dehiscence of operative wound, initial encounter  Right carpal tunnel incision  is well approximated with good deep tissue healing. Portion of maceration on distal aspect. Steri strips provided, advised wound will continue to heal. Keep clean and dry, wash with soap and water.   I have spent 25 minutes with this patient, >50% of which was spent on counseling and coordination of care.  The entirety of the information documented in the History of Present Illness, Review of Systems and Physical Exam were personally obtained by me. Portions of this information were initially documented by Lynford Humphrey, CMA and reviewed by me for thoroughness and accuracy.        Trinna Post, PA-C  [facility]

## 2018-08-29 ENCOUNTER — Telehealth: Payer: Self-pay | Admitting: Physician Assistant

## 2018-08-29 NOTE — Telephone Encounter (Signed)
lmtcb

## 2018-08-29 NOTE — Telephone Encounter (Signed)
Do you still have form? KW

## 2018-08-29 NOTE — Telephone Encounter (Signed)
I am wondering why he is needing this handicapped plaquard and what prosthesis he is referring to? I am assuming he is referring to his leg surgery in 2010? He did not indicate to me any issue walking at our most recent visit or any visits. He described to me that he likes to go hiking and lifts canoes, did not mention any issues with this. I have also never observed any issue with him walking into and out of clinic.

## 2018-08-29 NOTE — Telephone Encounter (Signed)
Pt came in to pick up the [employer] Form that was discussed at his OV with Adriana on 08/26/2018. Pt is requesting call back when form is completed and ready for pick up. Thanks TNP

## 2018-09-03 NOTE — Telephone Encounter (Signed)
Patients wife reports that he can not walk on hard surfaces for a long time and he can not bend his knee.

## 2018-09-03 NOTE — Telephone Encounter (Signed)
Patient advised. sd 

## 2018-09-03 NOTE — Telephone Encounter (Signed)
Signed.

## 2018-09-17 ENCOUNTER — Telehealth: Payer: Self-pay

## 2018-09-17 NOTE — Telephone Encounter (Signed)
[name] is requesting to speak with [name] regarding her husband if possible today. CB# [phone]

## 2018-09-18 NOTE — Telephone Encounter (Signed)
Patient's wife Manuela Schwartz is requesting a call back today if possible. CB# [phone].

## 2018-09-18 NOTE — Telephone Encounter (Signed)
Called back. Received voicemail. Left message. Please take message on return call.

## 2019-05-04 ENCOUNTER — Telehealth: Payer: Self-pay | Admitting: Family Medicine

## 2019-05-04 NOTE — Telephone Encounter (Signed)
Pt wanting to know the 1st Shingrix shot can be given to him in his car as a drive up. Pt is worried about coming into the office and being exposed to [place].  Pt did call insurance and they will cover it 100%.  Please call wife back to let her know. Manuela Schwartz [phone].  Thanks, [employer]

## 2019-05-04 NOTE — Telephone Encounter (Signed)
Patient's wife Manuela Schwartz advised patient would have to come in the office for vaccines.

## 2019-05-08 ENCOUNTER — Emergency Department: Payer: PRIVATE HEALTH INSURANCE

## 2019-05-08 ENCOUNTER — Encounter: Payer: Self-pay | Admitting: Emergency Medicine

## 2019-05-08 ENCOUNTER — Other Ambulatory Visit: Payer: Self-pay

## 2019-05-08 ENCOUNTER — Emergency Department
Admission: EM | Admit: 2019-05-08 | Discharge: 2019-05-08 | Disposition: A | Discharge status: Home / Self Care | Payer: PRIVATE HEALTH INSURANCE | Attending: Emergency Medicine | Admitting: Emergency Medicine

## 2019-05-08 DIAGNOSIS — Y939 Activity, unspecified: Secondary | ICD-10-CM | POA: Insufficient documentation

## 2019-05-08 DIAGNOSIS — S63639A Sprain of interphalangeal joint of unspecified finger, initial encounter: Secondary | ICD-10-CM

## 2019-05-08 DIAGNOSIS — Y929 Unspecified place or not applicable: Secondary | ICD-10-CM | POA: Insufficient documentation

## 2019-05-08 DIAGNOSIS — W1812XA Fall from or off toilet with subsequent striking against object, initial encounter: Secondary | ICD-10-CM | POA: Insufficient documentation

## 2019-05-08 DIAGNOSIS — S63634A Sprain of interphalangeal joint of right ring finger, initial encounter: Secondary | ICD-10-CM | POA: Diagnosis not present

## 2019-05-08 DIAGNOSIS — S6991XA Unspecified injury of right wrist, hand and finger(s), initial encounter: Secondary | ICD-10-CM | POA: Diagnosis present

## 2019-05-08 DIAGNOSIS — Y999 Unspecified external cause status: Secondary | ICD-10-CM | POA: Diagnosis not present

## 2019-05-08 DIAGNOSIS — I10 Essential (primary) hypertension: Secondary | ICD-10-CM | POA: Diagnosis not present

## 2019-05-08 DIAGNOSIS — Z7982 Long term (current) use of aspirin: Secondary | ICD-10-CM | POA: Insufficient documentation

## 2019-05-08 NOTE — ED Triage Notes (Signed)
Patient states that he jammed his right fourth finger working on his roof about 2 hours ago.

## 2019-05-08 NOTE — ED Provider Notes (Signed)
[facility] Emergency Department Provider Note  ____________________________________________   First MD Initiated Contact with Patient 05/08/19 2213     (approximate)  I have reviewed the triage vital signs and the nursing notes.   HISTORY  Chief Complaint Hand Pain    HPI [name] is a 58 y.o. male presents emergency department after hitting his right fourth finger on the edge of the roof and feeling a sharp stinging.  Patient is unable to extend the distal part of his finger.  No other injuries reported.  No numbness or tingling    History reviewed. No pertinent past medical history.  Patient Active Problem List   Diagnosis Date Noted  . Essential hypertension 08/26/2018  . Migraine with vertigo 01/05/2014  . Arthralgia of multiple joints 04/28/2009  . Carpal tunnel syndrome 08/30/2008    Past Surgical History:  Procedure Laterality Date  . LEG SURGERY      Prior to Admission medications   Medication Sig Start Date End Date Taking? Authorizing Provider  aspirin EC 81 MG tablet Take by mouth.    [provider]    Allergies Patient has no known allergies.  Family History  Problem Relation Age of Onset  . Breast cancer Mother   . Thyroid cancer Mother   . Lymphoma Father   . CVA Father   . Heart attack Sister   . Hypertension Sister     Social History Social History   Tobacco Use  . Smoking status: Never Smoker  . Smokeless tobacco: Never Used  Substance Use Topics  . Alcohol use: Yes    Alcohol/week: 3.0 standard drinks    Types: 3 Cans of beer per week  . Drug use: No    Review of Systems  Constitutional: No fever/chills Eyes: No visual changes. ENT: No sore throat. Respiratory: Denies cough Genitourinary: Negative for dysuria. Musculoskeletal: Negative for back pain.  Positive for right fourth finger Skin: Negative for rash.    ____________________________________________   PHYSICAL EXAM:   VITAL SIGNS: ED Triage Vitals [05/08/19 2142]  Enc Vitals Group     BP (!) 151/111     Pulse Rate 77     Resp 18     Temp      Temp src      SpO2 97 %     Weight 160 lb (72.6 kg)     Height 5\' 8"  (1.727 m)     Head Circumference      Peak Flow      Pain Score 0     Pain Loc      Pain Edu?      Excl. in [place]?     Constitutional: Alert and oriented. Well appearing and in no acute distress. Eyes: Conjunctivae are normal.  Head: Atraumatic. Nose: No congestion/rhinnorhea. Mouth/Throat: Mucous membranes are moist.   Neck:  supple no lymphadenopathy noted Cardiovascular: Normal rate, regular rhythm. Respiratory: Normal respiratory effort.  No retractions,  GU: deferred Musculoskeletal: The patient is unable to extend the distal portion of the right fourth finger.  No bony tenderness noted.  Neurovascular is intact.   Neurologic:  Normal speech and language.  Skin:  Skin is warm, dry and intact. No rash noted. Psychiatric: Mood and affect are normal. Speech and behavior are normal.  ____________________________________________   LABS (all labs ordered are listed, but only abnormal results are displayed)  Labs Reviewed - No data to display ____________________________________________   ____________________________________________  RADIOLOGY  [place] of  the right fourth finger is negative  ____________________________________________   PROCEDURES  Procedure(s) performed: Finger splint applied by nursing staff   Procedures    ____________________________________________   INITIAL IMPRESSION / ASSESSMENT AND PLAN / ED COURSE  Pertinent labs & imaging results that were available during my care of the patient were reviewed by me and considered in my medical decision making (see chart for details).   Patient is 58 year old male presents emergency department with concerns of right fourth finger injury.  See HPI  Physical exam shows patient is unable to extend the  distal portion of the right fourth finger.  X-ray of the right fourth finger is negative  Explained the findings to the patient.  He was placed in a finger splint and instructed [facility] to follow-up with hand specialist Dr. Jackqulyn Livings.  Patient states he will call her on Monday for an appointment.  He is to wear the splint until evaluated.  Is discharged stable condition.    [name] was evaluated in Emergency Department on 05/08/2019 for the symptoms described in the history of present illness. He was evaluated in the context of the global COVID-19 pandemic, which necessitated consideration that the patient might be at risk for infection with the SARS-CoV-2 virus that causes COVID-19. Institutional protocols and algorithms that pertain to the evaluation of patients at risk for COVID-19 are in a state of rapid change based on information released by regulatory bodies including the CDC and federal and state organizations. These policies and algorithms were followed during the patient's care in the ED.   As part of my medical decision making, I reviewed the following data within the [place] notes reviewed and incorporated, Old chart reviewed, Radiograph reviewed x-rays negative, Notes from prior ED visits and [place] Controlled Substance Database  ____________________________________________   FINAL CLINICAL IMPRESSION(S) / ED DIAGNOSES  Final diagnoses:  Traumatic rupture of tendon of distal interphalangeal (DIP) joint of finger, initial encounter      NEW MEDICATIONS STARTED DURING THIS VISIT:  New Prescriptions   No medications on file     Note:  This document was prepared using Dragon voice recognition software and may include unintentional dictation errors.    Versie Starks, PA-C 05/08/19 2257    Vanessa [place], MD 05/09/19 [phone]

## 2019-05-08 NOTE — ED Notes (Signed)
Pt not in either WR, xray called, pt not in xray.

## 2019-05-08 NOTE — Discharge Instructions (Addendum)
Follow-up with Dr. Lanell Persons.  Please call for an appointment.  Wear the finger splint until evaluated by her.  Tylenol or ibuprofen if needed for pain.

## 2019-05-11 ENCOUNTER — Ambulatory Visit (INDEPENDENT_AMBULATORY_CARE_PROVIDER_SITE_OTHER): Payer: PRIVATE HEALTH INSURANCE | Admitting: Psychology

## 2019-05-11 DIAGNOSIS — F4322 Adjustment disorder with anxiety: Secondary | ICD-10-CM | POA: Diagnosis not present

## 2019-05-25 ENCOUNTER — Telehealth (INDEPENDENT_AMBULATORY_CARE_PROVIDER_SITE_OTHER): Payer: PRIVATE HEALTH INSURANCE | Admitting: Physician Assistant

## 2019-05-25 DIAGNOSIS — I10 Essential (primary) hypertension: Secondary | ICD-10-CM

## 2019-05-25 MED ORDER — AMLODIPINE BESYLATE 5 MG PO TABS: 5.0000 mg | ORAL_TABLET | Freq: Every day | ORAL | 0 refills | Status: DC

## 2019-05-25 NOTE — Progress Notes (Signed)
       Patient: [name] Male    DOB: [birth year]   58 y.o.   MRN: [id number] Visit Date: 05/25/2019  Today's Provider: Trinna Post, PA-C   Chief Complaint  Patient presents with  . Hypertension   Subjective:    Virtual Visit via Video Note  I connected with [name] on 05/25/19 at  1:40 PM EDT by a video enabled telemedicine application and verified that I am speaking with the correct person using two identifiers.  Location: Patient: Home Provider: Office   I discussed the limitations of evaluation and management by telemedicine and the availability of in person appointments. The patient expressed understanding and agreed to proceed.   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   HTN:   Reports on average his BP has been on averaged 140/85 and 145/95. Scared to take daily BP medication because his father did and then ran out and had a stroke. Would like something PRN because he is not reliable in taking medication. . Patient interested in 24 hour ambulatory BP monitoring.    BP Readings from Last 3 Encounters:  05/08/19 (!) 183/103  08/26/18 (!) 163/95  05/21/18 (!) 158/108     No Known Allergies   Current Outpatient Medications:  .  aspirin EC 81 MG tablet, Take by mouth., Disp: , Rfl:   Review of Systems  Social History   Tobacco Use  . Smoking status: Never Smoker  . Smokeless tobacco: Never Used  Substance Use Topics  . Alcohol use: Yes    Alcohol/week: 3.0 standard drinks    Types: 3 Cans of beer per week      Objective:   There were no vitals taken for this visit. There were no vitals filed for this visit.There is no height or weight on file to calculate BMI.   Physical Exam Constitutional:      Appearance: Normal appearance.  Neurological:     Mental Status: He is alert.  Psychiatric:        Mood and Affect: Mood normal.        Behavior: Behavior normal.      No results found for any  visits on 05/25/19.     Assessment & Plan    1. Essential hypertension  Long discussion with patient about how I do not think PRN BP medication will offer optimal control. I discussed amlodipine which has a long half life and is more forgiving of missed doses. He is agreeable to trying this.   - amLODipine (NORVASC) 5 MG tablet; Take 1 tablet (5 mg total) by mouth daily.  Dispense: 90 tablet; Refill: 0    I discussed the assessment and treatment plan with the patient. The patient was provided an opportunity to ask questions and all were answered. The patient agreed with the plan and demonstrated an understanding of the instructions.   The patient was advised to call back or seek an in-person evaluation if the symptoms worsen or if the condition fails to improve as anticipated.  I provided 25 minutes of non-face-to-face time during this encounter.     Trinna Post, PA-C  [facility]

## 2019-06-23 ENCOUNTER — Ambulatory Visit: Payer: PRIVATE HEALTH INSURANCE | Admitting: Psychology

## 2019-06-25 ENCOUNTER — Ambulatory Visit: Payer: PRIVATE HEALTH INSURANCE | Admitting: Psychology

## 2019-12-03 ENCOUNTER — Ambulatory Visit: Payer: Self-pay
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
## 2019-12-08 ENCOUNTER — Telehealth: Payer: Self-pay

## 2019-12-08 ENCOUNTER — Other Ambulatory Visit: Payer: Self-pay

## 2019-12-08 ENCOUNTER — Encounter: Payer: Self-pay | Admitting: Physician Assistant

## 2019-12-08 ENCOUNTER — Ambulatory Visit (INDEPENDENT_AMBULATORY_CARE_PROVIDER_SITE_OTHER): Payer: 59 | Admitting: Physician Assistant

## 2019-12-08 VITALS — BP 167/95 | HR 73 | Temp 96.8°F | Resp 16 | Ht 67.0 in | Wt 159.0 lb

## 2019-12-08 DIAGNOSIS — T300 Burn of unspecified body region, unspecified degree: Secondary | ICD-10-CM | POA: Diagnosis not present

## 2019-12-08 MED ORDER — DOXYCYCLINE HYCLATE 100 MG PO TABS: 100.0000 mg | ORAL_TABLET | Freq: Two times a day (BID) | ORAL | 0 refills | Status: DC

## 2019-12-08 NOTE — Telephone Encounter (Signed)
Apt made for 12/08/2019 at 2pm  Thanks,   -Mickel Baas
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
## 2019-12-08 NOTE — Patient Instructions (Signed)
Burn Care, Adult A burn is an injury to the skin or the tissues under the skin. There are three types of burns:  First degree. These burns may cause the skin to be red and a bit swollen.  Second degree. These burns are very painful and cause the skin to be very red. The skin may also leak fluid, look shiny, and start to have blisters.  Third degree. These burns cause permanent damage. They turn the skin white or black and make it look charred, dry, and leathery. Taking care of your burn properly can help to prevent pain and infection. It can also help the burn to heal more quickly. How is this treated? Right after a burn:  Rinse or soak the burn under cool water. Do this for several minutes. Do not put ice on your burn. That can cause more damage.  Lightly cover the burn with a clean (sterile) cloth (dressing). Burn care  Raise (elevate) the injured area above the level of your heart while sitting or lying down.  Follow instructions from your doctor about: ? How to clean and take care of the burn. ? When to change and remove the cloth.  Check your burn every day for signs of infection. Check for: ? More redness, swelling, or pain. ? Warmth. ? Pus or a bad smell. Medicine   Take over-the-counter and prescription medicines only as told by your doctor.  If you were prescribed antibiotic medicine, take or apply it as told by your doctor. Do not stop using the antibiotic even if your condition improves. General instructions  To prevent infection: ? Do not put butter, oil, or other home treatments on the burn. ? Do not scratch or pick at the burn. ? Do not break any blisters. ? Do not peel skin.  Do not rub your burn, even when you are cleaning it.  Protect your burn from the sun. Contact a doctor if:  Your condition does not get better.  Your condition gets worse.  You have a fever.  Your burn looks different or starts to have black or red spots on it.  Your burn  feels warm to the touch.  Your pain is not controlled with medicine. Get help right away if:  You have redness, swelling, or pain at the site of the burn.  You have fluid, blood, or pus coming from your burn.  You have red streaks near the burn.  You have very bad pain. This information is not intended to replace advice given to you by your health care provider. Make sure you discuss any questions you have with your health care provider. Document Revised: 11/26/2018 Document Reviewed: 01/24/2016 Elsevier Patient Education  2020 [employer]  
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
## 2019-12-08 NOTE — Telephone Encounter (Signed)
Ok to schedule with someone for this afternoon if there are openings

## 2019-12-11 ENCOUNTER — Other Ambulatory Visit: Payer: Self-pay

## 2019-12-11 ENCOUNTER — Encounter: Payer: 59 | Attending: Internal Medicine | Admitting: Internal Medicine

## 2019-12-11 DIAGNOSIS — T25211A Burn of second degree of right ankle, initial encounter: Secondary | ICD-10-CM | POA: Insufficient documentation

## 2019-12-11 DIAGNOSIS — I10 Essential (primary) hypertension: Secondary | ICD-10-CM | POA: Insufficient documentation

## 2019-12-11 DIAGNOSIS — T25221A Burn of second degree of right foot, initial encounter: Secondary | ICD-10-CM | POA: Diagnosis not present

## 2019-12-11 DIAGNOSIS — Z8619 Personal history of other infectious and parasitic diseases: Secondary | ICD-10-CM | POA: Insufficient documentation

## 2019-12-11 DIAGNOSIS — X100XXA Contact with hot drinks, initial encounter: Secondary | ICD-10-CM | POA: Diagnosis not present

## 2019-12-11 NOTE — Progress Notes (Signed)
[name] ([id number]) Visit Report for 12/11/2019 Abuse/Suicide Risk Screen Details Patient Name: [name], [name] Date of Service: 12/11/2019 8:15 AM Medical Record Number: [id number] Patient Account Number: [id number] Date of Birth/Sex: Sep 19, 1960 (59 y.o. M) Treating RN: Army Melia Primary Care Keyondre Hepburn: Lavon Paganini Other Clinician: Referring Shantera Monts: Lavon Paganini Treating Channa Hazelett/Extender: Tito Dine in Treatment: 0 Abuse/Suicide Risk Screen Items Answer ABUSE RISK SCREEN: Has anyone close to you tried to hurt or harm you recentlyo No Do you feel uncomfortable with anyone in your familyo No Has anyone forced you do things that you didnot want to doo No Electronic Signature(s) Signed: 12/11/2019 11:45:23 AM By: Army Melia Entered By: Army Melia on 12/11/2019 08:56:03 Cazier, Odies B. ([id number]) -------------------------------------------------------------------------------- Activities of Daily Living Details Patient Name: [name], [name] Date of Service: 12/11/2019 8:15 AM Medical Record Number: [id number] Patient Account Number: [id number] Date of Birth/Sex: 1961-04-12 (59 y.o. M) Treating RN: Army Melia Primary Care Naydene Kamrowski: Lavon Paganini Other Clinician: Referring Makenzie Vittorio: Lavon Paganini Treating Diamond Jentz/Extender: Tito Dine in Treatment: 0 Activities of Daily Living Items Answer Activities of Daily Living (Please select one for each item) Drive Automobile Completely Able Take Medications Completely Able Use Telephone Completely Able Care for Appearance Completely Able Use Toilet Completely Able Bath / Shower Completely Able Dress Self Completely Able Feed Self Completely Able Walk Completely Able Get In / Out Bed Completely Able Housework Completely Able Prepare Meals Completely Able Handle Money Completely Able Shop for Self Completely Able Electronic Signature(s) Signed: 12/11/2019  11:45:23 AM By: Army Melia Entered By: Army Melia on 12/11/2019 08:56:11 Handel, Averi BMarland Kitchen ([id number]) -------------------------------------------------------------------------------- Education Screening Details Patient Name: Posten, [name] Date of Service: 12/11/2019 8:15 AM Medical Record Number: [id number] Patient Account Number: [id number] Date of Birth/Sex: [birth year] (59 y.o. M) Treating RN: Army Melia Primary Care Sherill Mangen: Lavon Paganini Other Clinician: Referring Dois Juarbe: Lavon Paganini Treating Lesleyann Fichter/Extender: Tito Dine in Treatment: 0 Learning Preferences/Education Level/Primary Language Learning Preference: Explanation Highest Education Level: College or Above Preferred Language: English Cognitive Barrier Language Barrier: No Translator Needed: No Memory Deficit: No Emotional Barrier: No Cultural/Religious Beliefs Affecting Medical Care: No Physical Barrier Impaired Vision: No Impaired Hearing: No Decreased Hand dexterity: No Knowledge/Comprehension Knowledge Level: High Comprehension Level: High Ability to understand written instructions: High Ability to understand verbal instructions: High Motivation Anxiety Level: Calm Cooperation: Cooperative Education Importance: Acknowledges Need Interest in Health Problems: Asks Questions Perception: Coherent Willingness to Engage in Self-Management High Activities: Readiness to Engage in Self-Management High Activities: Electronic Signature(s) Signed: 12/11/2019 11:45:23 AM By: Army Melia Entered By: Army Melia on 12/11/2019 08:56:26 Patient, Rece BMarland Kitchen ([id number]) -------------------------------------------------------------------------------- Fall Risk Assessment Details Patient Name: Kump, [name] Date of Service: 12/11/2019 8:15 AM Medical Record Number: [id number] Patient Account Number: [id number] Date of Birth/Sex: 1960/09/01 (58 y.o. M) Treating RN: Army Melia Primary Care Lovada Barwick: Lavon Paganini Other Clinician: Referring Marquarius Lofton: Lavon Paganini Treating Azari Hasler/Extender: Tito Dine in Treatment: 0 Fall Risk Assessment Items Have you had 2 or more falls in the last 12 monthso 0 No Have you had any fall that resulted in injury in the last 12 monthso 0 No FALLS RISK SCREEN History of falling - immediate or within 3 months 0 No Secondary diagnosis (Do you have 2 or more medical diagnoseso) 0 No Ambulatory aid None/bed rest/wheelchair/nurse 0 No Crutches/cane/walker 0 No Furniture 0 No Intravenous therapy Access/Saline/Heparin Lock 0 No Gait/Transferring Normal/ bed rest/ wheelchair 0 No Weak (short steps with or without shuffle,  stooped but able to lift head while walking, may seek 0 No support from furniture) Impaired (short steps with shuffle, may have difficulty arising from chair, head down, impaired 0 No balance) Mental Status Oriented to own ability 0 No Electronic Signature(s) Signed: 12/11/2019 11:45:23 AM By: Army Melia Entered By: Army Melia on 12/11/2019 08:56:31 Fineberg, Naseer B. ([id number]) -------------------------------------------------------------------------------- Foot Assessment Details Patient Name: Mcmahan, [name] Date of Service: 12/11/2019 8:15 AM Medical Record Number: [id number] Patient Account Number: [id number] Date of Birth/Sex: 06/21/1961 (59 y.o. M) Treating RN: Army Melia Primary Care Donyel Castagnola: Lavon Paganini Other Clinician: Referring Amyia Lodwick: Lavon Paganini Treating Deshara Rossi/Extender: Tito Dine in Treatment: 0 Foot Assessment Items Site Locations + = Sensation present, - = Sensation absent, C = Callus, U = Ulcer R = Redness, W = Warmth, M = Maceration, PU = Pre-ulcerative lesion F = Fissure, S = Swelling, D = Dryness Assessment Right: Left: Other Deformity: No No Prior Foot Ulcer: No No Prior Amputation: No No Charcot Joint: No  No Ambulatory Status: Ambulatory Without Help Gait: Steady Electronic Signature(s) Signed: 12/11/2019 11:45:23 AM By: Army Melia Entered By: Army Melia on 12/11/2019 08:57:31 Screws, Yoshimi B. ([id number]) -------------------------------------------------------------------------------- Nutrition Risk Screening Details Patient Name: Wenner, Jahkeem B. Date of Service: 12/11/2019 8:15 AM Medical Record Number: [id number] Patient Account Number: [id number] Date of Birth/Sex: 27-Feb-1961 (59 y.o. M) Treating RN: Army Melia Primary Care Finn Altemose: Lavon Paganini Other Clinician: Referring Taran Haynesworth: Lavon Paganini Treating Seyon Strader/Extender: Tito Dine in Treatment: 0 Height (in): 68 Weight (lbs): 159 Body Mass Index (BMI): 24.2 Nutrition Risk Screening Items Score Screening NUTRITION RISK SCREEN: I have an illness or condition that made me change the kind and/or amount of food I eat 0 No I eat fewer than two meals per day 0 No I eat few fruits and vegetables, or milk products 0 No I have three or more drinks of beer, liquor or wine almost every day 0 No I have tooth or mouth problems that make it hard for me to eat 0 No I don't always have enough money to buy the food I need 0 No I eat alone most of the time 0 No I take three or more different prescribed or over-the-counter drugs a day 0 No Without wanting to, I have lost or gained 10 pounds in the last six months 0 No I am not always physically able to shop, cook and/or feed myself 0 No Nutrition Protocols Good Risk Protocol 0 No interventions needed Moderate Risk Protocol High Risk Proctocol Risk Level: Good Risk Score: 0 Electronic Signature(s) Signed: 12/11/2019 11:45:23 AM By: Army Melia Entered By: Army Melia on 12/11/2019 08:56:38

## 2019-12-11 NOTE — Progress Notes (Signed)
LIZANDRO, SPELLMAN ([id number]) Visit Report for 12/11/2019 Allergy List Details Patient Name: Hietala, Trent B. Date of Service: 12/11/2019 8:15 AM Medical Record Number: [id number] Patient Account Number: [id number] Date of Birth/Sex: Jul 14, 1961 (59 y.o. M) Treating RN: Army Melia Primary Care Bentleigh Stankus: Lavon Paganini Other Clinician: Referring Rayden Scheper: Lavon Paganini Treating Bralin Garry/Extender: Ricard Dillon Weeks in Treatment: 0 Allergies Active Allergies No Known Allergies Allergy Notes Electronic Signature(s) Signed: 12/11/2019 11:45:23 AM By: Army Melia Entered By: Army Melia on 12/11/2019 08:53:21 Haithcock, Cleotha B. ([id number]) -------------------------------------------------------------------------------- Arrival Information Details Patient Name: Vermette, Belen B. Date of Service: 12/11/2019 8:15 AM Medical Record Number: [id number] Patient Account Number: [id number] Date of Birth/Sex: 1960-08-29 (59 y.o. M) Treating RN: Army Melia Primary Care Burch Marchuk: Lavon Paganini Other Clinician: Referring Mckenley Birenbaum: Lavon Paganini Treating Bertie Mcconathy/Extender: Tito Dine in Treatment: 0 Visit Information Patient Arrived: Ambulatory Arrival Time: 08:37 Accompanied By: self Transfer Assistance: None Patient Identification Verified: Yes Electronic Signature(s) Signed: 12/11/2019 11:45:23 AM By: Army Melia Entered By: Army Melia on 12/11/2019 08:37:51 Lagrow, Keldric BMarland Kitchen ([id number]) -------------------------------------------------------------------------------- Clinic Level of Care Assessment Details Patient Name: Davanzo, Tremell B. Date of Service: 12/11/2019 8:15 AM Medical Record Number: [id number] Patient Account Number: [id number] Date of Birth/Sex: March 15, 1961 (59 y.o. M) Treating RN: Montey Hora Primary Care Akshara Blumenthal: Lavon Paganini Other Clinician: Referring Majed Pellegrin: Lavon Paganini Treating Alannah Averhart/Extender:  Tito Dine in Treatment: 0 Clinic Level of Care Assessment Items TOOL 2 Quantity Score _0  - Use when only an EandM is performed on the INITIAL visit 0 ASSESSMENTS - Nursing Assessment / Reassessment X - General Physical Exam (combine w/ comprehensive assessment (listed just below) when performed on new pt. 1 20 evals) X- 1 25 Comprehensive Assessment (HX, ROS, Risk Assessments, Wounds Hx, etc.) ASSESSMENTS - Wound and Skin Assessment / Reassessment _1  - Simple Wound Assessment / Reassessment - one wound 0 X- 2 5 Complex Wound Assessment / Reassessment - multiple wounds _2  - 0 Dermatologic / Skin Assessment (not related to wound area) ASSESSMENTS - Ostomy and/or Continence Assessment and Care _3  - Incontinence Assessment and Management 0 _4  - 0 Ostomy Care Assessment and Management (repouching, etc.) PROCESS - Coordination of Care X - Simple Patient / Family Education for ongoing care 1 15 _5  - 0 Complex (extensive) Patient / Family Education for ongoing care X- 1 10 Staff obtains Programmer, systems, Records, Test Results / Process Orders _6  - 0 Staff telephones HHA, Nursing Homes / Clarify orders / etc _7  - 0 Routine Transfer to another Facility (non-emergent condition) _8  - 0 Routine Hospital Admission (non-emergent condition) X- 1 15 New Admissions / Biomedical engineer / Ordering NPWT, Apligraf, etc. _9  - 0 Emergency Hospital Admission (emergent condition) X- 1 10 Simple Discharge Coordination _10  - 0 Complex (extensive) Discharge Coordination PROCESS - Special Needs _11  - Pediatric / Minor Patient Management 0 _12  - 0 Isolation Patient Management _13  - 0 Hearing / Language / Visual special needs _14  - 0 Assessment of Community assistance (transportation, D/C planning, etc.) _15  - 0 Additional assistance / Altered mentation _16  - 0 Support Surface(s) Assessment (bed, cushion, seat, etc.) INTERVENTIONS - Wound Cleansing / Measurement X - Wound Imaging  (photographs - any number of wounds) 1 5 Markowicz, Trice B. ([id number]) _17  - 0 Wound Tracing (instead of photographs) _18  - 0 Simple Wound Measurement - one wound X- 2 5 Complex Wound Measurement - multiple wounds _19  - 0 Simple Wound Cleansing - one wound X- 2 5 Complex Wound Cleansing - multiple wounds INTERVENTIONS -  Wound Dressings _0  - Small Wound Dressing one or multiple wounds 0 X- 1 15 Medium Wound Dressing one or multiple wounds _1  - 0 Large Wound Dressing one or multiple wounds <[id number]>_2<\/[id number]>_0  - 0 Application of Medications - injection INTERVENTIONS - Miscellaneous _3  - External ear exam 0 _4  - 0 Specimen Collection (cultures, biopsies, blood, body fluids, etc.) _5  - 0 Specimen(s) / Culture(s) sent or taken to Lab for analysis _6  - 0 Patient Transfer (multiple staff / Harrel Lemon Lift / Similar devices) _7  - 0 Simple Staple / Suture removal (25 or less) _8  - 0 Complex Staple / Suture removal (26 or more) _9  - 0 Hypo / Hyperglycemic Management (close monitor of Blood Glucose) X- 1 15 Ankle / Brachial Index (ABI) - do not check if billed separately Has the patient been seen at the hospital within the last three years: Yes Total Score: 160 Level Of Care: New/Established - Level 5 Electronic Signature(s) Signed: 12/11/2019 12:04:24 PM By: Montey Hora Entered By: Montey Hora on 12/11/2019 09:08:42 Ludlum, Christena Deem ([id number]) -------------------------------------------------------------------------------- Encounter Discharge Information Details Patient Name: Pardon, Dwight B. Date of Service: 12/11/2019 8:15 AM Medical Record Number: [id number] Patient Account Number: [id number] Date of Birth/Sex: [birth year] (59 y.o. M) Treating RN: Montey Hora Primary Care Sherre Wooton: Lavon Paganini Other Clinician: Referring Riane Rung: Lavon Paganini Treating Renzo Vincelette/Extender: Tito Dine in Treatment: 0 Encounter Discharge Information Items Discharge Condition:  Stable Ambulatory Status: Ambulatory Discharge Destination: Home Transportation: Private Auto Accompanied By: self Schedule Follow-up Appointment: Yes Clinical Summary of Care: Electronic Signature(s) Signed: 12/11/2019 12:04:24 PM By: Montey Hora Entered By: Montey Hora on 12/11/2019 09:09:41 Pearlman, Caesar BMarland Kitchen ([id number]) -------------------------------------------------------------------------------- Lower Extremity Assessment Details Patient Name: Tarkington, Cephas B. Date of Service: 12/11/2019 8:15 AM Medical Record Number: [id number] Patient Account Number: [id number] Date of Birth/Sex: May 08, 1961 (59 y.o. M) Treating RN: Army Melia Primary Care Tauna Macfarlane: Lavon Paganini Other Clinician: Referring Jasara Corrigan: Lavon Paganini Treating Rilei Kravitz/Extender: Tito Dine in Treatment: 0 Edema Assessment Assessed: [Left: No] [Right: No] Edema: [Left: No] [Right: No] Calf Left: Right: Point of Measurement: 32 cm From Medial Instep 34 cm 34 cm Ankle Left: Right: Point of Measurement: 11 cm From Medial Instep 19 cm 20 cm Vascular Assessment Pulses: Dorsalis Pedis Palpable: [Left:Yes] [Right:Yes] Doppler Audible: [Right:Yes] Posterior Tibial Palpable: [Right:Yes] Doppler Audible: [Right:Yes] Blood Pressure: Brachial: [Right:164] Dorsalis Pedis: 198 Ankle: Posterior Tibial: 190 Ankle Brachial Index: [Right:1.21] Electronic Signature(s) Signed: 12/11/2019 11:45:23 AM By: Army Melia Entered By: Army Melia on 12/11/2019 08:53:13 Marez, Naim B. ([id number]) -------------------------------------------------------------------------------- Multi Wound Chart Details Patient Name: Wilmarth, Kaylub B. Date of Service: 12/11/2019 8:15 AM Medical Record Number: [id number] Patient Account Number: [id number] Date of Birth/Sex: 10-25-1960 (59 y.o. M) Treating RN: Montey Hora Primary Care Ameenah Prosser: Lavon Paganini Other Clinician: Referring Tiphani Mells:  Lavon Paganini Treating Daylin Gruszka/Extender: Tito Dine in Treatment: 0 Vital Signs Height(in): 68 Pulse(bpm): 65 Weight(lbs): 159 Blood Pressure(mmHg): 164/87 Body Mass Index(BMI): 24 Temperature(F): 98.2 Respiratory Rate(breaths/min): 16 Photos: [N/A:N/A] Wound Location: Right, Medial Ankle Right, Medial Foot N/A Wounding Event: Thermal Burn Thermal Burn N/A Primary Etiology: 2nd degree Burn 2nd degree Burn N/A Date Acquired: 12/03/2019 12/03/2019 N/A Weeks of Treatment: 0 0 N/A Wound Status: Open Open N/A Measurements L x W x D (cm) 2x4.5x0.1 4x11.5x0.1 N/A Area (cm) : 7.069 36.128 N/A Volume (cm) : 0.707 3.613 N/A Classification: Full Thickness Without Exposed Full Thickness Without Exposed N/A Support Structures Support Structures Exudate Amount: Medium Medium N/A Exudate Type: Serosanguineous Serosanguineous N/A Exudate Color: red,  brown red, brown N/A Granulation Amount: Small (1-33%) Small (1-33%) N/A Granulation Quality: Pink Pink N/A Necrotic Amount: Large (67-100%) Large (67-100%) N/A Necrotic Tissue: Eschar, Adherent Slough Eschar, Adherent Slough N/A Exposed Structures: Fat Layer (Subcutaneous Tissue) Fat Layer (Subcutaneous Tissue) N/A Exposed: Yes Exposed: Yes Fascia: No Fascia: No Tendon: No Tendon: No Muscle: No Muscle: No Joint: No Joint: No Bone: No Bone: No Epithelialization: None None N/A Treatment Notes Electronic Signature(s) Signed: 12/11/2019 12:04:24 PM By: Montey Hora Entered By: Montey Hora on 12/11/2019 09:03:48 Bialas, Christena Deem ([id number]) -------------------------------------------------------------------------------- Multi-Disciplinary Care Plan Details Patient Name: Bober, Jamelle B. Date of Service: 12/11/2019 8:15 AM Medical Record Number: [id number] Patient Account Number: [id number] Date of Birth/Sex: 03-06-1961 (59 y.o. M) Treating RN: Montey Hora Primary Care Tyrian Peart: Lavon Paganini Other  Clinician: Referring Riley Papin: Lavon Paganini Treating Ricardo Schubach/Extender: Tito Dine in Treatment: 0 Active Inactive Orientation to the Wound Care Program Nursing Diagnoses: Knowledge deficit related to the wound healing center program Goals: Patient/caregiver will verbalize understanding of the [place] Program Date Initiated: 12/11/2019 Target Resolution Date: 02/27/2020 Goal Status: Active Interventions: Provide education on orientation to the wound center Notes: Pain, Acute or Chronic Nursing Diagnoses: Pain, acute or chronic: actual or potential Goals: Patient/caregiver will verbalize comfort level met Date Initiated: 12/11/2019 Target Resolution Date: 02/27/2020 Goal Status: Active Interventions: Complete pain assessment as per visit requirements Notes: Wound/Skin Impairment Nursing Diagnoses: Impaired tissue integrity Goals: Ulcer/skin breakdown will heal within 14 weeks Date Initiated: 12/11/2019 Target Resolution Date: 02/27/2020 Goal Status: Active Interventions: Assess patient/caregiver ability to obtain necessary supplies Assess patient/caregiver ability to perform ulcer/skin care regimen upon admission and as needed Assess ulceration(s) every visit Notes: Electronic Signature(s) Signed: 12/11/2019 12:04:24 PM By: Wolfgang Phoenix, Christena Deem ([id number]) Entered By: Montey Hora on 12/11/2019 09:03:25 Mihok, Zamari B. ([id number]) -------------------------------------------------------------------------------- Pain Assessment Details Patient Name: Dowty, Eileen B. Date of Service: 12/11/2019 8:15 AM Medical Record Number: [id number] Patient Account Number: [id number] Date of Birth/Sex: 1960/11/07 (59 y.o. M) Treating RN: Army Melia Primary Care Mistie Adney: Lavon Paganini Other Clinician: Referring Jaylin Benzel: Lavon Paganini Treating Rhiley Tarver/Extender: Tito Dine in Treatment: 0 Active  Problems Location of Pain Severity and Description of Pain Patient Has Paino Yes Site Locations Pain Location: Pain in Ulcers Rate the pain. Current Pain Level: 6 Pain Management and Medication Current Pain Management: Electronic Signature(s) Signed: 12/11/2019 11:45:23 AM By: Army Melia Entered By: Army Melia on 12/11/2019 08:38:01 Selsor, Hagen Jacinto Reap ([id number]) -------------------------------------------------------------------------------- Patient/Caregiver Education Details Patient Name: Wojtaszek, Koleson B. Date of Service: 12/11/2019 8:15 AM Medical Record Number: [id number] Patient Account Number: [id number] Date of Birth/Gender: [birth year] (59 y.o. M) Treating RN: Montey Hora Primary Care Physician: Lavon Paganini Other Clinician: Referring Physician: Lavon Paganini Treating Physician/Extender: Tito Dine in Treatment: 0 Education Assessment Education Provided To: Patient Education Topics Provided Wound/Skin Impairment: Handouts: Other: wound care as ordered Methods: Demonstration, Explain/Verbal Responses: State content correctly Electronic Signature(s) Signed: 12/11/2019 12:04:24 PM By: Montey Hora Entered By: Montey Hora on 12/11/2019 09:09:02 Gleghorn, Conn B. ([id number]) -------------------------------------------------------------------------------- Wound Assessment Details Patient Name: Leffel, Ivy B. Date of Service: 12/11/2019 8:15 AM Medical Record Number: [id number] Patient Account Number: [id number] Date of Birth/Sex: 05-04-1961 (59 y.o. M) Treating RN: Army Melia Primary Care Ephraim Reichel: Lavon Paganini Other Clinician: Referring Gael Delude: Lavon Paganini Treating Xyon Lukasik/Extender: Ricard Dillon Weeks in Treatment: 0 Wound Status Wound Number: 1 Primary Etiology: 2nd degree Burn Wound Location: Right, Medial Ankle Wound Status: Open Wounding Event: Thermal Burn Date Acquired: 12/03/2019  Weeks Of  Treatment: 0 Clustered Wound: No Photos Wound Measurements Length: (cm) 2 Width: (cm) 4.5 Depth: (cm) 0.1 Area: (cm) 7.069 Volume: (cm) 0.707 % Reduction in Area: % Reduction in Volume: Epithelialization: None Tunneling: No Undermining: No Wound Description Classification: Full Thickness Without Exposed Support Structu Exudate Amount: Medium Exudate Type: Serosanguineous Exudate Color: red, brown res Foul Odor After Cleansing: No Slough/Fibrino Yes Wound Bed Granulation Amount: Small (1-33%) Exposed Structure Granulation Quality: Pink Fascia Exposed: No Necrotic Amount: Large (67-100%) Fat Layer (Subcutaneous Tissue) Exposed: Yes Necrotic Quality: Eschar, Adherent Slough Tendon Exposed: No Muscle Exposed: No Joint Exposed: No Bone Exposed: No Treatment Notes Wound #1 (Right, Medial Ankle) Notes silvadene, xeroform, abd, conform and netting Electronic Signature(s) Signed: 12/11/2019 11:45:23 AM By: Hilton Cork, Christena Deem ([id number]) Entered By: Army Melia on 12/11/2019 08:46:30 Duell, Jacolby B. ([id number]) -------------------------------------------------------------------------------- Wound Assessment Details Patient Name: Cangelosi, Euclid B. Date of Service: 12/11/2019 8:15 AM Medical Record Number: [id number] Patient Account Number: [id number] Date of Birth/Sex: [birth year] (59 y.o. M) Treating RN: Army Melia Primary Care Loy Mccartt: Lavon Paganini Other Clinician: Referring Nohea Kras: Lavon Paganini Treating Payson Evrard/Extender: Ricard Dillon Weeks in Treatment: 0 Wound Status Wound Number: 2 Primary Etiology: 2nd degree Burn Wound Location: Right, Medial Foot Wound Status: Open Wounding Event: Thermal Burn Date Acquired: 12/03/2019 Weeks Of Treatment: 0 Clustered Wound: No Photos Wound Measurements Length: (cm) 4 Width: (cm) 11.5 Depth: (cm) 0.1 Area: (cm) 36.128 Volume: (cm) 3.613 % Reduction in Area: % Reduction in  Volume: Epithelialization: None Tunneling: No Undermining: No Wound Description Classification: Full Thickness Without Exposed Support Structu Exudate Amount: Medium Exudate Type: Serosanguineous Exudate Color: red, brown res Foul Odor After Cleansing: No Slough/Fibrino Yes Wound Bed Granulation Amount: Small (1-33%) Exposed Structure Granulation Quality: Pink Fascia Exposed: No Necrotic Amount: Large (67-100%) Fat Layer (Subcutaneous Tissue) Exposed: Yes Necrotic Quality: Eschar, Adherent Slough Tendon Exposed: No Muscle Exposed: No Joint Exposed: No Bone Exposed: No Treatment Notes Wound #2 (Right, Medial Foot) Notes silvadene, xeroform, abd, conform and netting Electronic Signature(s) Signed: 12/11/2019 11:45:23 AM By: Hilton Cork, Christena Deem ([id number]) Entered By: Army Melia on 12/11/2019 08:47:34 Lezotte, Abdulkarim B. ([id number]) -------------------------------------------------------------------------------- Vitals Details Patient Name: Hosie, Yuan B. Date of Service: 12/11/2019 8:15 AM Medical Record Number: [id number] Patient Account Number: [id number] Date of Birth/Sex: Mar 07, 1961 (58 y.o. M) Treating RN: Army Melia Primary Care Tamarah Bhullar: Lavon Paganini Other Clinician: Referring Bernadine Melecio: Lavon Paganini Treating Mariella Blackwelder/Extender: Tito Dine in Treatment: 0 Vital Signs Time Taken: 08:38 Temperature (F): 98.2 Height (in): 68 Pulse (bpm): 65 Source: Stated Respiratory Rate (breaths/min): 16 Weight (lbs): 159 Blood Pressure (mmHg): 164/87 Source: Stated Reference Range: 80 - 120 mg / dl Body Mass Index (BMI): 24.2 Electronic Signature(s) Signed: 12/11/2019 11:45:23 AM By: Army Melia Entered By: Army Melia on 12/11/2019 08:39:41
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
## 2019-12-18 ENCOUNTER — Other Ambulatory Visit: Payer: Self-pay

## 2019-12-18 ENCOUNTER — Encounter: Payer: 59 | Admitting: Physician Assistant

## 2019-12-18 DIAGNOSIS — T25211A Burn of second degree of right ankle, initial encounter: Secondary | ICD-10-CM | POA: Diagnosis not present
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
## 2019-12-25 ENCOUNTER — Encounter: Payer: 59 | Attending: Physician Assistant | Admitting: Physician Assistant

## 2019-12-25 ENCOUNTER — Other Ambulatory Visit: Payer: Self-pay

## 2019-12-25 DIAGNOSIS — Z8619 Personal history of other infectious and parasitic diseases: Secondary | ICD-10-CM | POA: Insufficient documentation

## 2019-12-25 DIAGNOSIS — T25211A Burn of second degree of right ankle, initial encounter: Secondary | ICD-10-CM | POA: Diagnosis present

## 2019-12-25 DIAGNOSIS — T25291A Burn of second degree of multiple sites of right ankle and foot, initial encounter: Secondary | ICD-10-CM | POA: Diagnosis not present

## 2019-12-25 DIAGNOSIS — X100XXA Contact with hot drinks, initial encounter: Secondary | ICD-10-CM | POA: Insufficient documentation

## 2019-12-25 DIAGNOSIS — I10 Essential (primary) hypertension: Secondary | ICD-10-CM | POA: Insufficient documentation
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
## 2019-12-25 NOTE — Progress Notes (Addendum)
MANNING, LUNA ([id number]) Visit Report for 12/25/2019 Arrival Information Details Patient Name: MILFRED, KRAMMES B. Date of Service: 12/25/2019 1:45 PM Medical Record Number: [id number] Patient Account Number: [id number] Date of Birth/Sex: [birth year] (59 y.o. M) Treating RN: Montey Hora Primary Care Adrien Dietzman: Lavon Paganini Other Clinician: Referring Muaz Shorey: Lavon Paganini Treating Ayinde Swim/Extender: Melburn Hake, HOYT Weeks in Treatment: 2 Visit Information History Since Last Visit Added or deleted any medications: No Patient Arrived: Ambulatory Any new allergies or adverse reactions: No Arrival Time: 13:54 Had a fall or experienced change in No Accompanied By: self activities of daily living that may affect Transfer Assistance: None risk of falls: Patient Identification Verified: Yes Signs or symptoms of abuse/neglect since last visito No Secondary Verification Process Completed: Yes Hospitalized since last visit: No Implantable device outside of the clinic excluding No cellular tissue based products placed in the center since last visit: Has Dressing in Place as Prescribed: Yes Pain Present Now: Yes Electronic Signature(s) Signed: 12/25/2019 2:37:45 PM By: Lorine Bears RCP, RRT, CHT Entered By: Lorine Bears on 12/25/2019 13:54:53 Morson, Jodeci B. ([id number]) -------------------------------------------------------------------------------- Encounter Discharge Information Details Patient Name: Lewing, Masami B. Date of Service: 12/25/2019 1:45 PM Medical Record Number: [id number] Patient Account Number: [id number] Date of Birth/Sex: 01/13/1961 (59 y.o. M) Treating RN: Montey Hora Primary Care Jasiyah Paulding: Lavon Paganini Other Clinician: Referring Lanessa Shill: Lavon Paganini Treating Kresta Templeman/Extender: Melburn Hake, HOYT Weeks in Treatment: 2 Encounter Discharge Information Items Post Procedure Vitals Discharge Condition:  Stable Temperature (F): 97.8 Ambulatory Status: Ambulatory Pulse (bpm): 67 Discharge Destination: Home Respiratory Rate (breaths/min): 16 Transportation: Private Auto Blood Pressure (mmHg): 167/90 Accompanied By: self Schedule Follow-up Appointment: Yes Clinical Summary of Care: Electronic Signature(s) Signed: 12/25/2019 2:23:04 PM By: Montey Hora Entered By: Montey Hora on 12/25/2019 14:23:03 Stickler, Tage BMarland Kitchen ([id number]) -------------------------------------------------------------------------------- Lower Extremity Assessment Details Patient Name: Sestak, Chee B. Date of Service: 12/25/2019 1:45 PM Medical Record Number: [id number] Patient Account Number: [id number] Date of Birth/Sex: [birth year] (59 y.o. M) Treating RN: Army Melia Primary Care Kinza Gouveia: Lavon Paganini Other Clinician: Referring Immanuel Fedak: Lavon Paganini Treating Jodel Mayhall/Extender: STONE III, HOYT Weeks in Treatment: 2 Edema Assessment Assessed: [Left: No] [Right: No] Edema: [Left: N] [Right: o] Vascular Assessment Pulses: Dorsalis Pedis Palpable: [Right:Yes] Electronic Signature(s) Signed: 12/28/2019 9:21:34 AM By: Army Melia Entered By: Army Melia on 12/25/2019 13:57:43 Lamountain, Maston B. ([id number]) -------------------------------------------------------------------------------- Multi Wound Chart Details Patient Name: Quackenbush, Davionte B. Date of Service: 12/25/2019 1:45 PM Medical Record Number: [id number] Patient Account Number: [id number] Date of Birth/Sex: March 06, 1961 (59 y.o. M) Treating RN: Montey Hora Primary Care Gabreil Yonkers: Lavon Paganini Other Clinician: Referring Terence Bart: Lavon Paganini Treating Jamiyla Ishee/Extender: STONE III, HOYT Weeks in Treatment: 2 Vital Signs Height(in): 68 Pulse(bpm): 59 Weight(lbs): 159 Blood Pressure(mmHg): 167/90 Body Mass Index(BMI): 24 Temperature(F): 97.8 Respiratory Rate(breaths/min): 16 Photos: [N/A:N/A] Wound Location:  Right, Medial Ankle Right, Medial Foot N/A Wounding Event: Thermal Burn Thermal Burn N/A Primary Etiology: 2nd degree Burn 2nd degree Burn N/A Date Acquired: 12/03/2019 12/03/2019 N/A Weeks of Treatment: 2 2 N/A Wound Status: Open Open N/A Measurements L x W x D (cm) 0.5x1x0.1 2x1x0.1 N/A Area (cm) : 0.393 1.571 N/A Volume (cm) : 0.039 0.157 N/A % Reduction in Area: 94.40% 95.70% N/A % Reduction in Volume: 94.50% 95.70% N/A Classification: Full Thickness Without Exposed Full Thickness Without Exposed N/A Support Structures Support Structures Exudate Amount: Medium Medium N/A Exudate Type: Serosanguineous Serosanguineous N/A Exudate Color: red, brown red, brown N/A Granulation Amount: Small (1-33%) Small (1-33%) N/A Granulation Quality: Pink Pink N/A  Necrotic Amount: Large (67-100%) Large (67-100%) N/A Necrotic Tissue: Eschar, Adherent Sheridan N/A Exposed Structures: Fat Layer (Subcutaneous Tissue) Fat Layer (Subcutaneous Tissue) N/A Exposed: Yes Exposed: Yes Fascia: No Fascia: No Tendon: No Tendon: No Muscle: No Muscle: No Joint: No Joint: No Bone: No Bone: No Epithelialization: None None N/A Treatment Notes Electronic Signature(s) Signed: 12/25/2019 3:24:15 PM By: Montey Hora Entered By: Montey Hora on 12/25/2019 14:14:27 Schindel, Christena Deem ([id number]) -------------------------------------------------------------------------------- Multi-Disciplinary Care Plan Details Patient Name: Cancro, Dandrea B. Date of Service: 12/25/2019 1:45 PM Medical Record Number: [id number] Patient Account Number: [id number] Date of Birth/Sex: [birth year] (59 y.o. M) Treating RN: Montey Hora Primary Care Georgine Wiltse: Lavon Paganini Other Clinician: Referring Rashay Barnette: Lavon Paganini Treating Sandra Tellefsen/Extender: Melburn Hake, HOYT Weeks in Treatment: 2 Active Inactive Orientation to the Wound Care Program Nursing Diagnoses: Knowledge deficit related to the  wound healing center program Goals: Patient/caregiver will verbalize understanding of the [facility] Date Initiated: 12/11/2019 Target Resolution Date: 02/27/2020 Goal Status: Active Interventions: Provide education on orientation to the wound center Notes: Pain, Acute or Chronic Nursing Diagnoses: Pain, acute or chronic: actual or potential Goals: Patient/caregiver will verbalize comfort level met Date Initiated: 12/11/2019 Target Resolution Date: 02/27/2020 Goal Status: Active Interventions: Complete pain assessment as per visit requirements Notes: Wound/Skin Impairment Nursing Diagnoses: Impaired tissue integrity Goals: Ulcer/skin breakdown will heal within 14 weeks Date Initiated: 12/11/2019 Target Resolution Date: 02/27/2020 Goal Status: Active Interventions: Assess patient/caregiver ability to obtain necessary supplies Assess patient/caregiver ability to perform ulcer/skin care regimen upon admission and as needed Assess ulceration(s) every visit Notes: Electronic Signature(s) Signed: 12/25/2019 3:24:15 PM By: Montey Hora Entered By: Montey Hora on 12/25/2019 14:14:11 Sheen, Richey B. ([id number]) -------------------------------------------------------------------------------- Pain Assessment Details Patient Name: Alverio, Emiliano B. Date of Service: 12/25/2019 1:45 PM Medical Record Number: [id number] Patient Account Number: [id number] Date of Birth/Sex: 1961/01/04 (59 y.o. M) Treating RN: Army Melia Primary Care Ford Peddie: Lavon Paganini Other Clinician: Referring Eston Heslin: Lavon Paganini Treating Riddik Senna/Extender: Melburn Hake, HOYT Weeks in Treatment: 2 Active Problems Location of Pain Severity and Description of Pain Patient Has Paino Yes Site Locations Pain Location: Pain in Ulcers Rate the pain. Current Pain Level: 7 Pain Management and Medication Current Pain Management: Electronic Signature(s) Signed: 12/28/2019 9:21:34 AM  By: Army Melia Entered By: Army Melia on 12/25/2019 13:56:58 Filsinger, Sheldon B. ([id number]) -------------------------------------------------------------------------------- Patient/Caregiver Education Details Patient Name: Khawaja, Keilon B. Date of Service: 12/25/2019 1:45 PM Medical Record Number: [id number] Patient Account Number: [id number] Date of Birth/Gender: [birth year] (59 y.o. M) Treating RN: Montey Hora Primary Care Physician: Lavon Paganini Other Clinician: Referring Physician: Lavon Paganini Treating Physician/Extender: Sharalyn Ink in Treatment: 2 Education Assessment Education Provided To: Patient and Caregiver Education Topics Provided Wound/Skin Impairment: Handouts: Other: wound care as ordered Methods: Demonstration, Explain/Verbal Responses: State content correctly Electronic Signature(s) Signed: 12/25/2019 3:24:15 PM By: Montey Hora Entered By: Montey Hora on 12/25/2019 14:18:42 Fridman, Khyan B. ([id number]) -------------------------------------------------------------------------------- Wound Assessment Details Patient Name: Gunawan, Raymundo B. Date of Service: 12/25/2019 1:45 PM Medical Record Number: [id number] Patient Account Number: [id number] Date of Birth/Sex: 1961-04-20 (59 y.o. M) Treating RN: Army Melia Primary Care Kamerin Grumbine: Lavon Paganini Other Clinician: Referring Alphonsine Minium: Lavon Paganini Treating Terrian Ridlon/Extender: STONE III, HOYT Weeks in Treatment: 2 Wound Status Wound Number: 1 Primary Etiology: 2nd degree Burn Wound Location: Right, Medial Ankle Wound Status: Open Wounding Event: Thermal Burn Date Acquired: 12/03/2019 Weeks Of Treatment: 2 Clustered Wound: No Photos Wound Measurements Length: (cm) 0.5 Width: (cm) 1 Depth: (cm)  0.1 Area: (cm) 0.393 Volume: (cm) 0.039 % Reduction in Area: 94.4% % Reduction in Volume: 94.5% Epithelialization: None Wound Description Classification: Full  Thickness Without Exposed Support Structu Exudate Amount: Medium Exudate Type: Serosanguineous Exudate Color: red, brown res Foul Odor After Cleansing: No Slough/Fibrino Yes Wound Bed Granulation Amount: Small (1-33%) Exposed Structure Granulation Quality: Pink Fascia Exposed: No Necrotic Amount: Large (67-100%) Fat Layer (Subcutaneous Tissue) Exposed: Yes Necrotic Quality: Eschar, Adherent Slough Tendon Exposed: No Muscle Exposed: No Joint Exposed: No Bone Exposed: No Treatment Notes Wound #1 (Right, Medial Ankle) Notes santyl, xeroform, abd, conform and netting Electronic Signature(s) Signed: 12/28/2019 9:21:34 AM By: Hilton Cork, Christena Deem ([id number]) Entered By: Army Melia on 12/25/2019 13:57:59 Bloodsaw, Kenyen B. ([id number]) -------------------------------------------------------------------------------- Wound Assessment Details Patient Name: Mccuen, Chesky B. Date of Service: 12/25/2019 1:45 PM Medical Record Number: [id number] Patient Account Number: [id number] Date of Birth/Sex: November 22, 1960 (59 y.o. M) Treating RN: Army Melia Primary Care Kroy Sprung: Lavon Paganini Other Clinician: Referring Lacy Sofia: Lavon Paganini Treating Jaimey Franchini/Extender: STONE III, HOYT Weeks in Treatment: 2 Wound Status Wound Number: 2 Primary Etiology: 2nd degree Burn Wound Location: Right, Medial Foot Wound Status: Open Wounding Event: Thermal Burn Date Acquired: 12/03/2019 Weeks Of Treatment: 2 Clustered Wound: No Photos Wound Measurements Length: (cm) 2 Width: (cm) 1 Depth: (cm) 0.1 Area: (cm) 1.571 Volume: (cm) 0.157 % Reduction in Area: 95.7% % Reduction in Volume: 95.7% Epithelialization: None Wound Description Classification: Full Thickness Without Exposed Support Structu Exudate Amount: Medium Exudate Type: Serosanguineous Exudate Color: red, brown res Foul Odor After Cleansing: No Slough/Fibrino Yes Wound Bed Granulation Amount: Small (1-33%)  Exposed Structure Granulation Quality: Pink Fascia Exposed: No Necrotic Amount: Large (67-100%) Fat Layer (Subcutaneous Tissue) Exposed: Yes Necrotic Quality: Eschar, Adherent Slough Tendon Exposed: No Muscle Exposed: No Joint Exposed: No Bone Exposed: No Treatment Notes Wound #2 (Right, Medial Foot) Notes santyl, xeroform, abd, conform and netting Electronic Signature(s) Signed: 12/28/2019 9:21:34 AM By: Hilton Cork, Christena Deem ([id number]) Entered By: Army Melia on 12/25/2019 13:58:13 Repass, Xiong B. ([id number]) -------------------------------------------------------------------------------- Vitals Details Patient Name: Blumenfeld, Tydus B. Date of Service: 12/25/2019 1:45 PM Medical Record Number: [id number] Patient Account Number: [id number] Date of Birth/Sex: [birth year] (59 y.o. M) Treating RN: Montey Hora Primary Care Obrien Huskins: Lavon Paganini Other Clinician: Referring Codee Bloodworth: Lavon Paganini Treating Cheney Gosch/Extender: STONE III, HOYT Weeks in Treatment: 2 Vital Signs Time Taken: 13:52 Temperature (F): 97.8 Height (in): 68 Pulse (bpm): 67 Weight (lbs): 159 Respiratory Rate (breaths/min): 16 Body Mass Index (BMI): 24.2 Blood Pressure (mmHg): 167/90 Reference Range: 80 - 120 mg / dl Electronic Signature(s) Signed: 12/25/2019 2:37:45 PM By: Lorine Bears RCP, RRT, CHT Entered By: Lorine Bears on 12/25/2019 13:55:44

## 2020-01-01 ENCOUNTER — Encounter: Payer: 59 | Admitting: Physician Assistant

## 2020-01-08 ENCOUNTER — Other Ambulatory Visit: Payer: Self-pay

## 2020-01-08 ENCOUNTER — Encounter: Payer: 59 | Admitting: Internal Medicine

## 2020-01-08 DIAGNOSIS — T25291A Burn of second degree of multiple sites of right ankle and foot, initial encounter: Secondary | ICD-10-CM | POA: Diagnosis not present

## 2020-01-08 NOTE — Progress Notes (Signed)
[name], [name] ([id number]) Visit Report for 01/08/2020 Arrival Information Details Patient Name: [name], [name] Date of Service: 01/08/2020 10:30 AM Medical Record Number: [id number] Patient Account Number: [id number] Date of Birth/Sex: Oct 15, 1960 (58 y.o. M) Treating RN: Montey Hora Primary Care Hatsumi Steinhart: Lavon Paganini Other Clinician: Referring Jani Moronta: Lavon Paganini Treating Corbett Moulder/Extender: Tito Dine in Treatment: 4 Visit Information History Since Last Visit Pain Present Now: No Patient Arrived: Ambulatory Arrival Time: 10:47 Accompanied By: self Transfer Assistance: None Patient Identification Verified: Yes Secondary Verification Process Completed: Yes Electronic Signature(s) Signed: 01/08/2020 1:36:47 PM By: Montey Hora Entered By: Montey Hora on 01/08/2020 10:48:15 Bradner, Shann B. ([id number]) -------------------------------------------------------------------------------- Encounter Discharge Information Details Patient Name: [name], [name] Date of Service: 01/08/2020 10:30 AM Medical Record Number: [id number] Patient Account Number: [id number] Date of Birth/Sex: [birth year] (58 y.o. M) Treating RN: Montey Hora Primary Care Xana Bradt: Lavon Paganini Other Clinician: Referring Ransome Helwig: Lavon Paganini Treating Indigo Barbian/Extender: Tito Dine in Treatment: 4 Encounter Discharge Information Items Post Procedure Vitals Discharge Condition: Stable Temperature (F): 97.6 Ambulatory Status: Ambulatory Pulse (bpm): 81 Discharge Destination: Home Respiratory Rate (breaths/min): 16 Transportation: Private Auto Blood Pressure (mmHg): 160/55 Accompanied By: self Schedule Follow-up Appointment: Yes Clinical Summary of Care: Electronic Signature(s) Signed: 01/08/2020 1:36:47 PM By: Montey Hora Entered By: Montey Hora on 01/08/2020 11:43:55 Kinnaird, Lawerance B.  ([id number]) -------------------------------------------------------------------------------- Lower Extremity Assessment Details Patient Name: [name], [name] Date of Service: 01/08/2020 10:30 AM Medical Record Number: [id number] Patient Account Number: [id number] Date of Birth/Sex: September 10, 1960 (58 y.o. M) Treating RN: Army Melia Primary Care Brooklyn Alfredo: Lavon Paganini Other Clinician: Referring Zeidy Tayag: Lavon Paganini Treating Adreena Willits/Extender: Ricard Dillon Weeks in Treatment: 4 Edema Assessment Assessed: [Left: No] [Right: No] Edema: [Left: N] [Right: o] Vascular Assessment Pulses: Dorsalis Pedis Palpable: [Right:Yes] Electronic Signature(s) Signed: 01/08/2020 11:08:48 AM By: Army Melia Entered By: Army Melia on 01/08/2020 10:53:30 Rhinehart, Christain B. ([id number]) -------------------------------------------------------------------------------- Multi Wound Chart Details Patient Name: [name], [name] Date of Service: 01/08/2020 10:30 AM Medical Record Number: [id number] Patient Account Number: [id number] Date of Birth/Sex: [birth year] (58 y.o. M) Treating RN: Montey Hora Primary Care Pollyann Roa: Lavon Paganini Other Clinician: Referring Helyn Schwan: Lavon Paganini Treating Vallorie Niccoli/Extender: Tito Dine in Treatment: 4 Vital Signs Height(in): 68 Pulse(bpm): 81 Weight(lbs): 159 Blood Pressure(mmHg): 160/55 Body Mass Index(BMI): 24 Temperature(F): 97.6 Respiratory Rate(breaths/min): 16 Photos: [N/A:N/A] Wound Location: Right, Medial Ankle Right, Medial Foot N/A Wounding Event: Thermal Burn Thermal Burn N/A Primary Etiology: 2nd degree Burn 2nd degree Burn N/A Date Acquired: 12/03/2019 12/03/2019 N/A Weeks of Treatment: 4 4 N/A Wound Status: Open Open N/A Measurements L x W x D (cm) 0x0x0 1.1x1x0.1 N/A Area (cm) : 0 0.864 N/A Volume (cm) : 0 0.086 N/A % Reduction in Area: 100.00% 97.60% N/A % Reduction in Volume: 100.00% 97.60%  N/A Classification: Full Thickness Without Exposed Full Thickness Without Exposed N/A Support Structures Support Structures Exudate Amount: Medium Medium N/A Exudate Type: Serosanguineous Serosanguineous N/A Exudate Color: red, brown red, brown N/A Granulation Amount: Small (1-33%) Small (1-33%) N/A Granulation Quality: Pink Pink N/A Necrotic Amount: Large (67-100%) Large (67-100%) N/A Necrotic Tissue: Eschar, Adherent Slough Eschar, Adherent Slough N/A Exposed Structures: Fat Layer (Subcutaneous Tissue) Fat Layer (Subcutaneous Tissue) N/A Exposed: Yes Exposed: Yes Fascia: No Fascia: No Tendon: No Tendon: No Muscle: No Muscle: No Joint: No Joint: No Bone: No Bone: No Epithelialization: None None N/A Debridement: N/A Chemical/Enzymatic/Mechanical N/A Pre-procedure Verification/Time N/A 11:40 N/A Out Taken: Pain Control: N/A Lidocaine 4% Topical Solution N/A Instrument: N/A  Other(tongue blade) N/A Bleeding: N/A None N/A Procedural Pain: N/A 0 N/A Post Procedural Pain: N/A 0 N/A Debridement Treatment N/A Procedure was tolerated well N/A Response: Post Debridement N/A 1.1x1x0.1 N/A Measurements L x W x D (cm) Post Debridement Volume: N/A 0.086 N/A (cm) Procedures Performed: N/A Debridement N/A Franchi, Kenshawn B. ([id number]) Treatment Notes Wound #2 (Right, Medial Foot) Notes santyl, xeroform, abd, conform and netting Electronic Signature(s) Signed: 01/08/2020 12:54:43 PM By: Linton Ham MD Entered By: Linton Ham on 01/08/2020 12:04:36 Moskowitz, Christena Deem ([id number]) -------------------------------------------------------------------------------- Multi-Disciplinary Care Plan Details Patient Name: [name], [name] Date of Service: 01/08/2020 10:30 AM Medical Record Number: [id number] Patient Account Number: [id number] Date of Birth/Sex: 09/26/1960 (58 y.o. M) Treating RN: Montey Hora Primary Care Ebrima Ranta: Lavon Paganini Other Clinician: Referring  Melesio Madara: Lavon Paganini Treating Mearl Olver/Extender: Tito Dine in Treatment: 4 Active Inactive Orientation to the Wound Care Program Nursing Diagnoses: Knowledge deficit related to the wound healing center program Goals: Patient/caregiver will verbalize understanding of the [place] Program Date Initiated: 12/11/2019 Target Resolution Date: 02/27/2020 Goal Status: Active Interventions: Provide education on orientation to the wound center Notes: Pain, Acute or Chronic Nursing Diagnoses: Pain, acute or chronic: actual or potential Goals: Patient/caregiver will verbalize comfort level met Date Initiated: 12/11/2019 Target Resolution Date: 02/27/2020 Goal Status: Active Interventions: Complete pain assessment as per visit requirements Notes: Wound/Skin Impairment Nursing Diagnoses: Impaired tissue integrity Goals: Ulcer/skin breakdown will heal within 14 weeks Date Initiated: 12/11/2019 Target Resolution Date: 02/27/2020 Goal Status: Active Interventions: Assess patient/caregiver ability to obtain necessary supplies Assess patient/caregiver ability to perform ulcer/skin care regimen upon admission and as needed Assess ulceration(s) every visit Notes: Electronic Signature(s) Signed: 01/08/2020 1:36:47 PM By: Montey Hora Entered By: Montey Hora on 01/08/2020 11:38:05 Reitano, Daymond B. ([id number]) -------------------------------------------------------------------------------- Pain Assessment Details Patient Name: Gashi, Olden B. Date of Service: 01/08/2020 10:30 AM Medical Record Number: [id number] Patient Account Number: [id number] Date of Birth/Sex: [birth year] (58 y.o. M) Treating RN: Army Melia Primary Care Garlin Batdorf: Lavon Paganini Other Clinician: Referring Jesus Poplin: Lavon Paganini Treating Ndrew Creason/Extender: Tito Dine in Treatment: 4 Active Problems Location of Pain Severity and Description of Pain Patient  Has Paino No Site Locations Pain Management and Medication Current Pain Management: Electronic Signature(s) Signed: 01/08/2020 11:08:48 AM By: Army Melia Entered By: Army Melia on 01/08/2020 10:52:17 Wehrli, Authur BMarland Kitchen ([id number]) -------------------------------------------------------------------------------- Patient/Caregiver Education Details Patient Name: [name] Date of Service: 01/08/2020 10:30 AM Medical Record Number: [id number] Patient Account Number: [id number] Date of Birth/Gender: 1961-08-11 (60 y.o. M) Treating RN: Montey Hora Primary Care Physician: Lavon Paganini Other Clinician: Referring Physician: Lavon Paganini Treating Physician/Extender: Tito Dine in Treatment: 4 Education Assessment Education Provided To: Patient and Caregiver Education Topics Provided Wound/Skin Impairment: Handouts: Other: santyl Methods: Demonstration, Explain/Verbal Responses: State content correctly Electronic Signature(s) Signed: 01/08/2020 1:36:47 PM By: Montey Hora Entered By: Montey Hora on 01/08/2020 11:42:14 Agosto, Gotham B. ([id number]) -------------------------------------------------------------------------------- Wound Assessment Details Patient Name: Nuzzo, Marin B. Date of Service: 01/08/2020 10:30 AM Medical Record Number: [id number] Patient Account Number: [id number] Date of Birth/Sex: 05/20/1961 (58 y.o. M) Treating RN: Army Melia Primary Care Adaora Mchaney: Lavon Paganini Other Clinician: Referring Coulson Wehner: Lavon Paganini Treating Takira Sherrin/Extender: Tito Dine in Treatment: 4 Wound Status Wound Number: 1 Primary Etiology: 2nd degree Burn Wound Location: Right, Medial Ankle Wound Status: Open Wounding Event: Thermal Burn Date Acquired: 12/03/2019 Weeks Of Treatment: 4 Clustered Wound: No Photos Wound Measurements Length: (cm) 0 Width: (cm) 0 Depth: (cm) 0 Area: (  cm) Volume: (cm) %  Reduction in Area: 100% % Reduction in Volume: 100% Epithelialization: None 0 0 Wound Description Classification: Full Thickness Without Exposed Support Structu Exudate Amount: Medium Exudate Type: Serosanguineous Exudate Color: red, brown res Foul Odor After Cleansing: No Slough/Fibrino Yes Wound Bed Granulation Amount: Small (1-33%) Exposed Structure Granulation Quality: Pink Fascia Exposed: No Necrotic Amount: Large (67-100%) Fat Layer (Subcutaneous Tissue) Exposed: Yes Necrotic Quality: Eschar, Adherent Slough Tendon Exposed: No Muscle Exposed: No Joint Exposed: No Bone Exposed: No Electronic Signature(s) Signed: 01/08/2020 11:08:48 AM By: Army Melia Entered By: Army Melia on 01/08/2020 10:52:50 Scheffler, Jathen B. ([id number]) -------------------------------------------------------------------------------- Wound Assessment Details Patient Name: Frieson, Kalijah B. Date of Service: 01/08/2020 10:30 AM Medical Record Number: [id number] Patient Account Number: [id number] Date of Birth/Sex: 1960-11-17 (58 y.o. M) Treating RN: Army Melia Primary Care Tamel Abel: Lavon Paganini Other Clinician: Referring Shadara Lopez: Lavon Paganini Treating Rocklin Soderquist/Extender: Tito Dine in Treatment: 4 Wound Status Wound Number: 2 Primary Etiology: 2nd degree Burn Wound Location: Right, Medial Foot Wound Status: Open Wounding Event: Thermal Burn Date Acquired: 12/03/2019 Weeks Of Treatment: 4 Clustered Wound: No Photos Wound Measurements Length: (cm) 1.1 Width: (cm) 1 Depth: (cm) 0.1 Area: (cm) 0.864 Volume: (cm) 0.086 % Reduction in Area: 97.6% % Reduction in Volume: 97.6% Epithelialization: None Wound Description Classification: Full Thickness Without Exposed Support Structu Exudate Amount: Medium Exudate Type: Serosanguineous Exudate Color: red, brown res Foul Odor After Cleansing: No Slough/Fibrino Yes Wound Bed Granulation Amount: Small (1-33%)  Exposed Structure Granulation Quality: Pink Fascia Exposed: No Necrotic Amount: Large (67-100%) Fat Layer (Subcutaneous Tissue) Exposed: Yes Necrotic Quality: Eschar, Adherent Slough Tendon Exposed: No Muscle Exposed: No Joint Exposed: No Bone Exposed: No Treatment Notes Wound #2 (Right, Medial Foot) Notes santyl, xeroform, abd, conform and netting Electronic Signature(s) Signed: 01/08/2020 11:08:48 AM By: Hilton Cork, Christena Deem ([id number]) Entered By: Army Melia on 01/08/2020 10:53:08 Birdsall, Mahki B. ([id number]) -------------------------------------------------------------------------------- Vitals Details Patient Name: Rahl, Justinn B. Date of Service: 01/08/2020 10:30 AM Medical Record Number: [id number] Patient Account Number: [id number] Date of Birth/Sex: [birth year] (58 y.o. M) Treating RN: Montey Hora Primary Care Gerry Heaphy: Lavon Paganini Other Clinician: Referring Burnard Enis: Lavon Paganini Treating Montrel Donahoe/Extender: Tito Dine in Treatment: 4 Vital Signs Time Taken: 10:48 Temperature (F): 97.6 Height (in): 68 Pulse (bpm): 81 Weight (lbs): 159 Respiratory Rate (breaths/min): 16 Body Mass Index (BMI): 24.2 Blood Pressure (mmHg): 160/55 Reference [name]: 80 - 120 mg / dl Electronic Signature(s) Signed: 01/08/2020 1:36:47 PM By: Montey Hora Entered By: Montey Hora on 01/08/2020 10:51:17
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
## 2020-01-22 ENCOUNTER — Encounter: Payer: 59 | Attending: Physician Assistant | Admitting: Physician Assistant

## 2020-01-22 ENCOUNTER — Other Ambulatory Visit: Payer: Self-pay

## 2020-01-22 DIAGNOSIS — Z8619 Personal history of other infectious and parasitic diseases: Secondary | ICD-10-CM | POA: Insufficient documentation

## 2020-01-22 DIAGNOSIS — X100XXA Contact with hot drinks, initial encounter: Secondary | ICD-10-CM | POA: Diagnosis not present

## 2020-01-22 DIAGNOSIS — I10 Essential (primary) hypertension: Secondary | ICD-10-CM | POA: Diagnosis not present

## 2020-01-22 DIAGNOSIS — T25211A Burn of second degree of right ankle, initial encounter: Secondary | ICD-10-CM | POA: Diagnosis not present
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
## 2020-01-26 ENCOUNTER — Encounter: Payer: Self-pay | Admitting: Physician Assistant

## 2020-01-29 ENCOUNTER — Ambulatory Visit: Payer: 59 | Admitting: Physician Assistant

## 2020-02-02 ENCOUNTER — Ambulatory Visit: Payer: 59 | Admitting: Family Medicine

## 2020-02-02 ENCOUNTER — Ambulatory Visit: Payer: Self-pay | Admitting: *Deleted

## 2020-02-02 ENCOUNTER — Other Ambulatory Visit: Payer: Self-pay | Admitting: Adult Health

## 2020-02-02 ENCOUNTER — Other Ambulatory Visit
Admission: RE | Admit: 2020-02-02 | Discharge: 2020-02-02 | Disposition: A | Discharge status: Home / Self Care | Payer: 59 | Source: Ambulatory Visit | Attending: Adult Health | Admitting: Adult Health

## 2020-02-02 ENCOUNTER — Ambulatory Visit
Admission: RE | Admit: 2020-02-02 | Discharge: 2020-02-02 | Disposition: A | Discharge status: Home / Self Care | Payer: 59 | Source: Ambulatory Visit | Attending: Adult Health | Admitting: Adult Health

## 2020-02-02 ENCOUNTER — Ambulatory Visit (INDEPENDENT_AMBULATORY_CARE_PROVIDER_SITE_OTHER): Payer: 59 | Admitting: Adult Health

## 2020-02-02 ENCOUNTER — Other Ambulatory Visit: Payer: Self-pay

## 2020-02-02 ENCOUNTER — Encounter: Payer: Self-pay | Admitting: Adult Health

## 2020-02-02 ENCOUNTER — Other Ambulatory Visit: Payer: Self-pay | Admitting: Family Medicine

## 2020-02-02 VITALS — BP 141/82 | HR 77 | Temp 98.2°F | Resp 16

## 2020-02-02 DIAGNOSIS — W11XXXA Fall on and from ladder, initial encounter: Secondary | ICD-10-CM

## 2020-02-02 DIAGNOSIS — M436 Torticollis: Secondary | ICD-10-CM

## 2020-02-02 DIAGNOSIS — M546 Pain in thoracic spine: Secondary | ICD-10-CM | POA: Diagnosis not present

## 2020-02-02 DIAGNOSIS — Z87898 Personal history of other specified conditions: Secondary | ICD-10-CM | POA: Diagnosis not present

## 2020-02-02 DIAGNOSIS — S0990XA Unspecified injury of head, initial encounter: Secondary | ICD-10-CM

## 2020-02-02 DIAGNOSIS — S060X0A Concussion without loss of consciousness, initial encounter: Secondary | ICD-10-CM | POA: Diagnosis not present

## 2020-02-02 DIAGNOSIS — R296 Repeated falls: Secondary | ICD-10-CM | POA: Insufficient documentation

## 2020-02-02 DIAGNOSIS — G44319 Acute post-traumatic headache, not intractable: Secondary | ICD-10-CM

## 2020-02-02 LAB — COMPREHENSIVE METABOLIC PANEL
ALT: 24 U/L (ref 0–44)
AST: 27 U/L (ref 15–41)
Albumin: 4.3 g/dL (ref 3.5–5.0)
Alkaline Phosphatase: 61 U/L (ref 38–126)
Anion gap: 11 (ref 5–15)
BUN: 14 mg/dL (ref 6–20)
CO2: 26 mmol/L (ref 22–32)
Calcium: 8.8 mg/dL — ABNORMAL LOW (ref 8.9–10.3)
Chloride: 101 mmol/L (ref 98–111)
Creatinine, Ser: 0.69 mg/dL (ref 0.61–1.24)
GFR calc Af Amer: >60 mL/min (ref >60)
GFR calc non Af Amer: >60 mL/min (ref >60)
Glucose, Bld: 96 mg/dL (ref 70–99)
Potassium: 4.4 mmol/L (ref 3.5–5.1)
Sodium: 138 mmol/L (ref 135–145)
Total Bilirubin: 1.7 mg/dL — ABNORMAL HIGH (ref 0.3–1.2)
Total Protein: 7.1 g/dL (ref 6.5–8.1)

## 2020-02-02 LAB — CBC WITH DIFFERENTIAL/PLATELET
Abs Immature Granulocytes: 0.02 10*3/uL (ref 0.00–0.07)
Basophils Absolute: 0 10*3/uL (ref 0.0–0.1)
Basophils Relative: 0 %
Eosinophils Absolute: 0.1 10*3/uL (ref 0.0–0.5)
Eosinophils Relative: 2 %
HCT: 44 % (ref 39.0–52.0)
Hemoglobin: 15 g/dL (ref 13.0–17.0)
Immature Granulocytes: 0 %
Lymphocytes Relative: 22 %
Lymphs Abs: 1.8 10*3/uL (ref 0.7–4.0)
MCH: 30.9 pg (ref 26.0–34.0)
MCHC: 34.1 g/dL (ref 30.0–36.0)
MCV: 90.7 fL (ref 80.0–100.0)
Monocytes Absolute: 0.5 10*3/uL (ref 0.1–1.0)
Monocytes Relative: 7 %
Neutro Abs: 5.6 10*3/uL (ref 1.7–7.7)
Neutrophils Relative %: 69 %
Platelets: 194 10*3/uL (ref 150–400)
RBC: 4.85 MIL/uL (ref 4.22–5.81)
RDW: 12.9 % (ref 11.5–15.5)
WBC: 8.1 10*3/uL (ref 4.0–10.5)
nRBC: 0 % (ref 0.0–0.2)
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
## 2020-02-02 NOTE — Progress Notes (Deleted)
  Subjective:     Patient ID: [name], male   DOB: 06-01-1961, 59 y.o.   MRN: [id number]  HPI   Review of Systems     Objective:   Physical Exam     Assessment:     ***    Plan:     ***
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
## 2020-02-02 NOTE — Progress Notes (Signed)
Established patient visit   Patient: [name]   DOB: 1960/10/21   59 y.o. Male  MRN: [id number] Visit Date: 02/02/2020  Today's healthcare provider: Marcille Buffy, FNP   Chief Complaint  Patient presents with  . Fall   Subjective    Fall The accident occurred 12 to 24 hours ago. The fall occurred from a ladder. He fell from a height of 6 to 10 ft (he reports falling around 7 feet from a ladder he did not posistion correctly and he says he fell flat on his back ). He landed on dirt. There was no blood loss. The point of impact was the head and neck. The pain is present in the head, neck and back (back ). The pain is at a severity of 7/10. The pain is moderate. The symptoms are aggravated by extension, movement, flexion, pressure on injury, rotation, sitting and standing. Pertinent negatives include no abdominal pain, bowel incontinence, fever, headaches, hearing loss, hematuria, loss of consciousness, nausea, numbness, tingling, visual change or vomiting. He has tried immobilization (patient reports that he tried using his wife prescription for Oxycodone with Acetaminophen) for the symptoms. The treatment provided no relief (rates pain 7- 10 in back and neck stiffness out of pain scale 0-10 ).  He reports he did not hit concrete he landed  Dull pain in back of head reported. He also has " neck stiffness".   Patient reports that in the past 2.5 weeks he has had 4 falls. Patient states that first fall was around  days ago, patient states that he was outside of his home and tripped in the dark over a hitch on a trailer and states that he landed on his back. Patient reports two days after initial fall he suffered another fall from tripping over his feet when walking. Patient reports at time of second fall he did hit his head but denies falling unconscious.  Third fall was 01/25/2020  patient states was days after the second fall and states that he slipped on slick surface on  porch and fell back hitting his head on the deck and patient denies falling unconscious.  Denies any other reason for falls except he tripped and then the ladder incident in the last 12 hours he reports the ladder was not positioned properly.   See [facility] message : copied into chart from 01/26/2020 Three times this week, i fell on the back of head. Twice i tripped over a root or caught my foot and hit a metal frame and a building frame. Neither time did I have swelling at the contact point, nor did I have symptoms. Last night I slipped on rain slicked stairs and landed on lower back and back of head (see embarrassing photo).    Symptoms last night were stiff neck and local pain from abraded skin, plus a funny pulsating feeling down right leg for a few minutes then gone. This morning, I had two symptoms; at waking my right leg, same spot, felt a similar pulsing feeling across the skin, then gone; 30 minutes later I ate small meal and had head-generated feeling of nausea. Not waves, but sharply onset, steady nausea for about 2 minutes then dissipated.   There is no lump on my head. Did not hit hard enough to abrade the skin. I took most of the fall on my back.  My question is "I'm gonna watch and wait?" P.S. No headache.   He denies any nausea or vomiting  now or since falling. " back hurts the most"   Denies any loss of bowel or bladder control. He denies any of the right leg pulsating reported in Mychart visit at today's visit. Denies saddle paresthesias.  Denies radiculopathy/ paresthesias.  He denies any alcohol or drug use causing falls. He reports muscle spasms in neck and back.   Patient  denies any fever,chills, rash, chest pain, shortness of breath, palpitations,,  nausea, vomiting, or diarrhea.  Denies dizziness, lightheadedness, pre syncopal or syncopal episodes.    Patient Active Problem List   Diagnosis Date Noted  . History of nausea- reported 01/27/2020 02/02/2020  . Accidental  fall from ladder 02/02/2020  . Acute right-sided thoracic back pain 02/02/2020  . Neck stiffness 02/02/2020  . Concussion with no loss of consciousness 02/02/2020  . Multiple falls 02/02/2020  . Head trauma, initial encounter 02/02/2020  . Acute post-traumatic headache, not intractable 02/02/2020  . Essential hypertension 08/26/2018  . Migraine with vertigo 01/05/2014  . Arthralgia of multiple joints 04/28/2009  . Carpal tunnel syndrome 08/30/2008   No past medical history on file. Social History   Tobacco Use  . Smoking status: Never Smoker  . Smokeless tobacco: Never Used  Substance Use Topics  . Alcohol use: Yes    Alcohol/week: 3.0 standard drinks    Types: 3 Cans of beer per week  . Drug use: No   No Known Allergies     Medications: Outpatient Medications Prior to Visit  Medication Sig  . amLODipine (NORVASC) 5 MG tablet Take 1 tablet (5 mg total) by mouth daily.  [name] aspirin EC 81 MG tablet Take by mouth.  . doxycycline (VIBRA-TABS) 100 MG tablet Take 1 tablet (100 mg total) by mouth 2 (two) times daily.   No facility-administered medications prior to visit.    Review of Systems  Constitutional: Positive for activity change. Negative for fever.  HENT: Negative.   Respiratory: Negative.   Cardiovascular: Negative.   Gastrointestinal: Negative for abdominal pain, bowel incontinence, nausea and vomiting.  Genitourinary: Negative for hematuria.  Musculoskeletal: Positive for arthralgias, back pain, gait problem, myalgias, neck pain and neck stiffness. Negative for joint swelling.  Skin: Positive for color change (abrasion to back from fall previoous not from fall from ladder per patient. ) and wound. Negative for pallor and rash.  Neurological: Negative for tingling, loss of consciousness, numbness and headaches.  Hematological: Negative.   Psychiatric/Behavioral: Negative for agitation, behavioral problems, confusion, decreased concentration, dysphoric mood,  hallucinations, self-injury, sleep disturbance and suicidal ideas. The patient is not nervous/anxious and is not hyperactive.     No recent labs  Last CBC Lab Results  Component Value Date   WBC 6.6 10/26/2016   HGB 15.7 10/26/2016   HCT 46.4 10/26/2016   MCV 89 10/26/2016   MCH 30.1 10/26/2016   RDW 13.7 10/26/2016   PLT 200 12/45/8099   Last metabolic panel Lab Results  Component Value Date   GLUCOSE 100 (H) 10/26/2016   NA 144 10/26/2016   K 4.7 10/26/2016   CL 101 10/26/2016   CO2 30 (H) 10/26/2016   BUN 17 10/26/2016   CREATININE 0.84 10/26/2016   GFRNONAA 98 10/26/2016   GFRAA 113 10/26/2016   CALCIUM 9.1 10/26/2016   PROT 6.6 10/26/2016   ALBUMIN 4.4 10/26/2016   LABGLOB 2.2 10/26/2016   AGRATIO 2.0 10/26/2016   BILITOT 0.8 10/26/2016   ALKPHOS 77 10/26/2016   AST 15 10/26/2016   ALT 16 10/26/2016   ANIONGAP 10  11/13/2011   Last lipids Lab Results  Component Value Date   CHOL 189 10/26/2016   HDL 69 10/26/2016   LDLCALC 97 10/26/2016   TRIG 117 10/26/2016   CHOLHDL 2.7 10/26/2016   Last hemoglobin A1c Lab Results  Component Value Date   HGBA1C 5.7 (H) 10/31/2016   Last thyroid functions No results found for: TSH, T3TOTAL, T4TOTAL, THYROIDAB Last vitamin D No results found for: 25OHVITD2, 25OHVITD3, VD25OH Last vitamin B12 and Folate No results found for: VITAMINB12, FOLATE    Objective    BP (!) 141/82   Pulse 77   Temp 98.2 F (36.8 C) (Temporal)   Resp 16   SpO2 98%  BP Readings from Last 3 Encounters:  02/02/20 (!) 141/82  12/08/19 (!) 167/95  05/08/19 (!) 183/103      Physical Exam Vitals reviewed.  Constitutional:      General: He is not in acute distress.    Appearance: He is not ill-appearing, toxic-appearing or diaphoretic.     Comments: He has a inflatable neck brace on and a vest for his back in place.  He is able to climb up on exam table though states " I feel better when I just lay here and do not move".   HENT:      Head: Normocephalic and atraumatic.     Jaw: There is normal jaw occlusion.     Right Ear: Tympanic membrane, ear canal and external ear normal. There is no impacted cerumen. No PE tube. No hemotympanum. Tympanic membrane is not erythematous.     Left Ear: Tympanic membrane, ear canal and external ear normal. There is no impacted cerumen. No hemotympanum. Tympanic membrane is not erythematous.     Nose: Nose normal. No congestion or rhinorrhea.     Right Nostril: No epistaxis.     Mouth/Throat:     Mouth: Mucous membranes are moist.     Pharynx: No oropharyngeal exudate or posterior oropharyngeal erythema.  Eyes:     General: No scleral icterus.       Right eye: No discharge.        Left eye: No discharge.     Extraocular Movements: Extraocular movements intact.     Conjunctiva/sclera: Conjunctivae normal.     Pupils: Pupils are equal, round, and reactive to light.  Neck:     Vascular: No carotid bruit.  Cardiovascular:     Rate and Rhythm: Normal rate and regular rhythm.     Pulses: Normal pulses.     Heart sounds: Normal heart sounds.  Pulmonary:     Effort: Pulmonary effort is normal. No respiratory distress.     Breath sounds: Normal breath sounds. No stridor. No wheezing, rhonchi or rales.  Chest:     Chest wall: No tenderness.  Abdominal:     Palpations: Abdomen is soft.  Musculoskeletal:        General: Normal range of motion.     Cervical back: Normal range of motion and neck supple. No rigidity or tenderness.  Lymphadenopathy:     Cervical: No cervical adenopathy.  Skin:    General: Skin is warm.     Capillary Refill: Capillary refill takes less than 2 seconds.     Findings: Erythema (lower back abrasion midline approximately 8cm x 7 cm. ) present.  Neurological:     General: No focal deficit present.     Mental Status: He is alert and oriented to person, place, and time.     GCS: GCS  eye subscore is 4. GCS verbal subscore is 5. GCS motor subscore is 6.     Cranial  Nerves: Cranial nerves are intact. No cranial nerve deficit.     Sensory: Sensation is intact. No sensory deficit.     Motor: Motor function is intact. No weakness.     Coordination: Coordination is intact. Coordination normal.     Gait: Gait abnormal (guarded motions, is able to climb on table for exam ). Tandem walk normal.     Deep Tendon Reflexes: Reflexes are normal and symmetric. Reflexes normal.  Psychiatric:        Mood and Affect: Mood normal.        Behavior: Behavior normal.        Thought Content: Thought content normal.        Judgment: Judgment normal.      No results found for any visits on 02/02/20.  Assessment & Plan    Concussion without loss of consciousness, initial encounter  History of nausea- reported 01/27/2020 - Plan: CT Head Wo Contrast, DG Ribs Unilateral Left, DG Ribs Unilateral Right  Fall from ladder, initial encounter - Plan: CBC with Differential/Platelet, Comprehensive Metabolic Panel (CMET), DG Ribs Unilateral Left, DG Ribs Unilateral Right  Acute right-sided thoracic back pain - Plan: DG Thoracic Spine 4V, DG Lumbar Spine Complete  Neck stiffness - Plan: DG Cervical Spine Complete  Multiple falls  Head trauma, initial encounter  Acute post-traumatic headache, not intractable  EKG not performed. Denies palpitations or any symptoms. DO advise Emergency room now not to self drive.  Advised patient to go to the emergency room now, his wife drove him here. He is advised not to drive. He refused emergency room even after risks versus benefits explained.  He has had multiple falls and repetitive head trauma. Advised him it is in my medical opinion that he be evaluated in the Emergency Room now.   Provider thoroughly discussed in collaboration above plan with supervising physician Dr. Miguel Aschoff who is in agreement with the care plan as above, however given patient declines care advice ok to order imaging. Patient understands that the emergency room  would be the most prudent given his situation however he declined.   Orders Placed This Encounter  Procedures  . CT Head Wo Contrast    Standing Status:   Future    Standing Expiration Date:   02/01/2021    Order Specific Question:   ** REASON FOR EXAM (FREE TEXT)    Answer:   concussion, head trauma x 3 fell hitting back of his head. fell from ladder 7 feet landing on head    Order Specific Question:   Preferred imaging location?    Answer:   [facility]    Order Specific Question:   Radiology Contrast Protocol - do NOT remove file path    Answer:   \\[email or web address]  . DG Cervical Spine Complete    Order Specific Question:   Reason for Exam (SYMPTOM  OR DIAGNOSIS REQUIRED)    Answer:   fall from ladder 6 feet neck stiffness    Order Specific Question:   Preferred imaging location?    Answer:   [facility]    Order Specific Question:   Radiology Contrast Protocol - do NOT remove file path    Answer:   \\[email or web address]  . DG Lumbar Spine Complete    Order Specific Question:   Reason for Exam (SYMPTOM  OR DIAGNOSIS REQUIRED)    Answer:   lumbar  back pain, fall from ladder 7 feet onto back    Order Specific Question:   Preferred imaging location?    Answer:   [facility]    Order Specific Question:   Radiology Contrast Protocol - do NOT remove file path    Answer:   \\[email or web address]  . DG Ribs Unilateral Left    Order Specific Question:   Reason for Exam (SYMPTOM  OR DIAGNOSIS REQUIRED)    Answer:   fall from ladder    Order Specific Question:   Preferred imaging location?    Answer:   [facility]    Order Specific Question:   Radiology Contrast Protocol - do NOT remove file path    Answer:   \\[email or web address]  . DG Ribs Unilateral Right    Standing Status:   Future    Number of Occurrences:   1    Standing  Expiration Date:   02/01/2021    Order Specific Question:   Reason for Exam (SYMPTOM  OR DIAGNOSIS REQUIRED)    Answer:   fall from ladder    Order Specific Question:   Preferred imaging location?    Answer:   [facility]    Order Specific Question:   Radiology Contrast Protocol - do NOT remove file path    Answer:   \\[email or web address]  . CBC with Differential/Platelet  . Comprehensive Metabolic Panel (CMET)    Return in about 3 days (around 02/05/2020), or if symptoms worsen or fail to improve, for at any time for any worsening symptoms, Go to Emergency room/ urgent care if worse.      IWellington Hampshire Mynor Witkop, FNP, have reviewed all documentation for this visit. The documentation on 02/02/20 for the exam, diagnosis, procedures, and orders are all accurate and complete.   Marcille Buffy, [place] [phone] (phone) [phone] (fax)  [place]

## 2020-02-02 NOTE — Patient Instructions (Signed)
Recommended Emergency room now for evaluation however patient declined. Advised not to drive.  STAT labs at hospital labs.  X rays of spine cervical - thoracic - lumbar now with head CT now.   Advised patient call the office or your primary care doctor for an appointment if no improvement within 72 hours or if any symptoms change or worsen at any time  Advised ER or urgent Care if after hours or on weekend. Call 911 for emergency symptoms at any time.Patinet verbalized understanding of all instructions given/reviewed and treatment plan and has no further questions or concerns at this time.     Acute Back Pain, Adult Acute back pain is sudden and usually short-lived. It is often caused by an injury to the muscles and tissues in the back. The injury may result from:  A muscle or ligament getting overstretched or torn (strained). Ligaments are tissues that connect bones to each other. Lifting something improperly can cause a back strain.  Wear and tear (degeneration) of the spinal disks. Spinal disks are circular tissue that provides cushioning between the bones of the spine (vertebrae).  Twisting motions, such as while playing sports or doing yard work.  A hit to the back.  Arthritis. You may have a physical exam, lab tests, and imaging tests to find the cause of your pain. Acute back pain usually goes away with rest and home care. Follow these instructions at home: Managing pain, stiffness, and swelling  Take over-the-counter and prescription medicines only as told by your health care provider.  Your health care provider may recommend applying ice during the first 24-48 hours after your pain starts. To do this: ? Put ice in a plastic bag. ? Place a towel between your skin and the bag. ? Leave the ice on for 20 minutes, 2-3 times a day.  If directed, apply heat to the affected area as often as told by your health care provider. Use the heat source that your health care provider  recommends, such as a moist heat pack or a heating pad. ? Place a towel between your skin and the heat source. ? Leave the heat on for 20-30 minutes. ? Remove the heat if your skin turns bright red. This is especially important if you are unable to feel pain, heat, or cold. You have a greater risk of getting burned. Activity   Do not stay in bed. Staying in bed for more than 1-2 days can delay your recovery.  Sit up and stand up straight. Avoid leaning forward when you sit, or hunching over when you stand. ? If you work at a desk, sit close to it so you do not need to lean over. Keep your chin tucked in. Keep your neck drawn back, and keep your elbows bent at a right angle. Your arms should look like the letter "L." ? Sit high and close to the steering wheel when you drive. Add lower back (lumbar) support to your car seat, if needed.  Take short walks on even surfaces as soon as you are able. Try to increase the length of time you walk each day.  Do not sit, drive, or stand in one place for more than 30 minutes at a time. Sitting or standing for long periods of time can put stress on your back.  Do not drive or use heavy machinery while taking prescription pain medicine.  Use proper lifting techniques. When you bend and lift, use positions that put less stress on your back: ?  Bend your knees. ? Keep the load close to your body. ? Avoid twisting.  Exercise regularly as told by your health care provider. Exercising helps your back heal faster and helps prevent back injuries by keeping muscles strong and flexible.  Work with a physical therapist to make a safe exercise program, as recommended by your health care provider. Do any exercises as told by your physical therapist. Lifestyle  Maintain a healthy weight. Extra weight puts stress on your back and makes it difficult to have good posture.  Avoid activities or situations that make you feel anxious or stressed. Stress and anxiety  increase muscle tension and can make back pain worse. Learn ways to manage anxiety and stress, such as through exercise. General instructions  Sleep on a firm mattress in a comfortable position. Try lying on your side with your knees slightly bent. If you lie on your back, put a pillow under your knees.  Follow your treatment plan as told by your health care provider. This may include: ? Cognitive or behavioral therapy. ? Acupuncture or massage therapy. ? Meditation or yoga. Contact a health care provider if:  You have pain that is not relieved with rest or medicine.  You have increasing pain going down into your legs or buttocks.  Your pain does not improve after 2 weeks.  You have pain at night.  You lose weight without trying.  You have a fever or chills. Get help right away if:  You develop new bowel or bladder control problems.  You have unusual weakness or numbness in your arms or legs.  You develop nausea or vomiting.  You develop abdominal pain.  You feel faint. Summary  Acute back pain is sudden and usually short-lived.  Use proper lifting techniques. When you bend and lift, use positions that put less stress on your back.  Take over-the-counter and prescription medicines and apply heat or ice as directed by your health care provider. This information is not intended to replace advice given to you by your health care provider. Make sure you discuss any questions you have with your health care provider. Document Revised: 11/25/2018 Document Reviewed: 03/20/2017 Elsevier Patient Education  [place] for Falls Each year, millions of people have serious injuries from falls. It is important to understand your risk for falling. Talk with your health care provider about your risk and what you can do to lower it. There are actions you can take at home to lower your risk. If you do have a serious fall, make sure you tell your health  care provider. Falling once raises your risk for falling again. How can falls affect me? Serious injuries from falls are common. These include:  Broken bones, such as hip fractures.  Head injuries, such as traumatic brain injuries (TBI). Fear of falling can also cause you to avoid activities and stay at home. This can make your muscles weaker and actually raise your risk for a fall. What can increase my risk? There are a number of risk factors that increase your risk for falling. The more risk factors you have, the higher your risk for falling. Serious injuries from a fall most often happen to people older than age 57. Children and young adults ages 27-29 are also at higher risk. Common risk factors include:  Weakness in the lower body.  Lack (deficiency) of vitamin D.  Being generally weak or confused due to long-term (chronic) illness.  Dizziness or balance problems.  Poor vision.  Medicines that cause dizziness or drowsiness. These can include medicines for your blood pressure, heart, anxiety, insomnia, or edema, as well as pain medicines and muscle relaxants. Other risk factors include:  Drinking alcohol.  Having had a fall in the past.  Having depression.  Foot pain or improper footwear.  Working at a dangerous job.  Having any of the following in your home: ? Tripping hazards, such as floor clutter or loose rugs. ? Poor lighting. ? Pets or clutter.  Dementia or memory loss. What actions can I take to lower my risk of falling?     Physical activity Maintain physical fitness. Do strength and balance exercises. Consider taking a regular class to build strength and balance. Yoga and tai chi are good options. Vision Have your eyes checked every year and your vision prescription updated as needed. Walking aids and footwear  Wear nonskid shoes. Do not wear high heels.  Do not walk around the house in socks or slippers.  Use a cane or walker as told by your health  care provider. Home safety  Attach secure railings on both sides of your stairs.  Install grab bars for your tub, shower, and toilet. Use a bath mat in your tub or shower.  Use good lighting in all rooms. Keep a flashlight near your bed.  Make sure there is a clear path from your bed to the bathroom. Use night-lights.  Do not use throw rugs. Make sure all carpeting is taped or tacked down securely.  Remove all clutter from walkways and stairways, including extension cords.  Repair uneven or broken steps.  Avoid walking on icy or slippery surfaces. Walk on the grass instead of on icy or slick sidewalks. Where you can, use ice melt to get rid of ice on walkways.  Use a cordless phone. Questions to ask your health care provider  Can you help me check my risk for a fall?  Do any of my medicines make me more likely to fall?  Should I take a vitamin D supplement?  What exercises can I do to improve my strength and balance?  Should I make an appointment to have my vision checked?  Do I need a bone density test to check for weak bones or osteoporosis?  Would it help to use a cane or a walker? Where to find more information  Centers for Disease Control and Prevention, STEADI: http://www.wolf.info/  Community-Based Fall Prevention Programs: http://www.wolf.info/  National Institute on Aging: ToneConnect.com.ee Contact a health care provider if:  You fall at home.  You are afraid of falling at home.  You feel weak, drowsy, or dizzy. Summary  People 93 and older are at high risk for falling. However, older people are not the only ones injured in falls. Children and young adults have a higher-than-normal risk too.  Talk with your health care provider about your risks for falling and how to lower those risks.  Taking certain precautions at home can lower your risk for falling.  If you fall, always tell your health care provider. This information is not intended to replace advice given to you  by your health care provider. Make sure you discuss any questions you have with your health care provider. Document Revised: 02/11/2019 Document Reviewed: 02/11/2019 Elsevier Patient Education  [place] Injury, Adult There are many types of head injuries. They can be as minor as a bump. Some head injuries can be worse. Worse injuries include:  A strong hit  to the head that shakes the brain back and forth causing damage (concussion).  A bruise (contusion) of the brain. This means there is bleeding in the brain that can cause swelling.  A cracked skull (skull fracture).  Bleeding in the brain that gathers, gets thick (makes a clot), and forms a bump (hematoma). Most problems from a head injury come in the first 24 hours. However, you may still have side effects up to 7-10 days after your injury. It is important to watch your condition for any changes. You may need to be watched in the emergency department or urgent care, or you may need to stay in the hospital. What are the causes? There are many possible causes of a head injury. A serious head injury may be caused by:  A car accident.  Bicycle or motorcycle accidents.  Sports injuries.  Falls. What are the signs or symptoms? Symptoms of a head injury include a bruise, bump, or bleeding where the injury happened. Other physical symptoms may include:  Headache.  Feeling sick to your stomach (nauseous) or vomiting.  Dizziness.  Feeling tired.  Being uncomfortable around bright lights or loud noises.  Shaking movements that you cannot control (seizures).  Trouble being woken up.  Passing out (fainting). Mental or emotional symptoms may include:  Feeling grumpy or cranky.  Confusion and memory problems.  Having trouble paying attention or concentrating.  Changes in eating or sleeping habits.  Feeling worried or nervous (anxious).  Feeling sad (depressed). How is this treated? Treatment for this  condition depends on how severe the injury is and the type of injury you have. The main goal is to prevent complications and to allow the brain time to heal. Mild head injury If you have a mild head injury, you may be sent home and treatment may include:  Being watched. A responsible adult should stay with you for 24 hours after your injury and check on you often.  Physical rest.  Brain rest.  Pain medicines. Severe head injury If you have a severe head injury, treatment may include:  Being watched closely. This includes hospitalization with frequent physical exams.  Medicines to: ? Help with pain. ? Prevent shaking movements that you cannot control. ? Help with brain swelling.  Using a machine that helps you breathe (ventilator).  Treatments to manage the swelling inside the brain.  Brain surgery. This may be needed to: ? Remove a blood clot. ? Stop the bleeding. ? Remove a part of the skull. This allows room for the brain to swell. Follow these instructions at home: Activity  Rest.  Avoid activities that are hard or tiring.  Make sure you get enough sleep.  Limit activities that need a lot of thought or attention, such as: ? Watching TV. ? Playing memory games and puzzles. ? Job-related work or homework. ? Working on [employer], and texting.  Avoid activities that could cause another head injury until your doctor says it is okay. This includes playing sports. Having another head injury, especially before the first one has healed, can be dangerous.  Ask your doctor when it is safe for you to go back to your normal activities, such as work or school. Ask your doctor for a step-by-step plan for slowly going back to your normal activities.  Ask your doctor when you can drive, ride a bicycle, or use heavy machinery. Do not do these activities if you are dizzy. Lifestyle   Do not drink alcohol until your  doctor says it is okay.  Do not use  drugs.  If it is harder than usual to remember things, write them down.  If you are easily distracted, try to do one thing at a time.  Talk with family members or close friends when making important decisions.  Tell your friends, family, a trusted coworker, and work Fre[employer] about your injury, symptoms, and limits (restrictions). Have them watch for any problems that are new or getting worse. General instructions  Take over-the-counter and prescription medicines only as told by your doctor.  Have someone stay with you for 24 hours after your head injury. This person should watch you for any changes in your symptoms and be ready to get help.  Keep all follow-up visits as told by your doctor. This is important. How is this prevented?  Work on [employer]. This can help you avoid falls.  Wear a seatbelt when you are in a moving vehicle.  Wear a helmet when you: ? Ride a bicycle. ? Ski. ? Do any other sport or activity that has a risk of injury.  If you drink alcohol: ? Limit how much you use to:  0-1 drink a day for women.  0-2 drinks a day for men. ? Be aware of how much alcohol is in your drink. In the [place] one drink equals one 12 oz bottle of beer (355 mL), one 5 oz glass of wine (148 mL), or one 1 oz glass of hard liquor (44 mL).  Make your home safer by: ? Getting rid of clutter from the floors and stairs. This includes things that can make you trip. ? Using grab bars in bathrooms and handrails by stairs. ? Placing non-slip mats on floors and in bathtubs. ? Putting more light in dim areas. Get help right away if:  You have: ? A very bad headache that is not helped by medicine. ? Trouble walking or weakness in your arms and legs. ? Clear or bloody fluid coming from your nose or ears. ? Changes in how you see (vision). ? Shaking movements that you cannot control.  You lose your balance.  You vomit.  The black centers of your eyes (pupils) change in  size.  Your speech is slurred.  Your dizziness gets worse.  You pass out.  You are sleepier than normal and have trouble staying awake.  Your symptoms get worse. These symptoms may be an emergency. Do not wait to see if the symptoms will go away. Get medical help right away. Call your local emergency services (911 in the [place] Do not drive yourself to the hospital. Summary  There are many types of head injuries. They can be as minor as a bump. Some head injuries can be worse  Treatment for this condition depends on how severe the injury is and the type of injury you have.  Ask your doctor when it is safe for you to go back to your normal activities, such as work or school.  To prevent a head injury, wear a seat belt in a car, wear a helmet when you use a a bicycle, limit your alcohol use, and make your home safer. This information is not intended to replace advice given to you by your health care provider. Make sure you discuss any questions you have with your health care provider. Document Revised: 11/27/2018 Document Reviewed: 08/29/2018 Elsevier Patient Education  [name].

## 2020-02-03 ENCOUNTER — Other Ambulatory Visit: Payer: Self-pay

## 2020-02-03 ENCOUNTER — Ambulatory Visit
Admission: RE | Admit: 2020-02-03 | Discharge: 2020-02-03 | Disposition: A | Discharge status: Home / Self Care | Payer: 59 | Source: Ambulatory Visit | Attending: Adult Health | Admitting: Adult Health

## 2020-02-03 DIAGNOSIS — Z87898 Personal history of other specified conditions: Secondary | ICD-10-CM

## 2020-02-03 NOTE — Progress Notes (Signed)
CT of head is negative. Keep follow up with PCP to discuss chronic issues and seek care immediately if changes or worsens at anytime.

## 2020-02-03 NOTE — Progress Notes (Signed)
1.Cervical x ray  mild degenerative disc disease noted C3 C4 Severe degenerative disc disease at C4-5, C5-C6 and C6-C7. No acute abnormality,  2.Left and right rib x ray's without any acute abnormality of the ribs.  3.Thoracic Spine-  mild degenerative chance without any acute process. Mild scoliosis is noted.  4. Lumbar spine - Severe degenerative disc disease noted L5-S1. Mild vertebrae protrusion L5- S1 noted. No acute abnormality.   Labs: CBC within normal limits. CMP total bilirubin elevated mildly.   Patient will need follow up appointment with PCP in the near future to discuss any other symptoms or concerns as well as further imaging work up/ referral to specialist for above results. No numbness/ tingling was reported at acute office visit, however is noted in [place] message.  If any loss of bowel/ bladder control, severe pain or paresthesias seek care immediately.  It is still recommended he have CT of head and he should be receiving a call.

## 2020-02-03 NOTE — Telephone Encounter (Signed)
Once CT of head is completed and if negative, we can fill Ibuprofen 600 mg every 8 hours as needed for pain.

## 2020-02-05 ENCOUNTER — Encounter: Payer: Self-pay | Admitting: Adult Health

## 2020-02-05 ENCOUNTER — Other Ambulatory Visit: Payer: Self-pay | Admitting: Adult Health

## 2020-02-05 ENCOUNTER — Encounter: Payer: Self-pay | Admitting: Physician Assistant

## 2020-02-05 MED ORDER — IBUPROFEN 600 MG PO TABS: 600.0000 mg | ORAL_TABLET | Freq: Three times a day (TID) | ORAL | 0 refills | Status: DC | PRN

## 2020-02-11 NOTE — Progress Notes (Signed)
     Established patient visit   Patient: [name]   DOB: May 23, 1961   59 y.o. Male  MRN: [id number] Visit Date: 02/12/2020  Today's healthcare provider: Trinna Post, PA-C   Chief Complaint  Patient presents with  . Follow-up   Subjective    HPI Follow up for a fall.   The patient was last seen for this 1 weeks ago. Changes made at last visit include cervical, thoracic, lumbar spine x-rays showed degenerative disk disease. Right and left rib x-rays were also normal. CBC was normal. CMP bilirubin was mildly elevated.    He reports good compliance with treatment. He feels that condition is Improved. He is not having side effects. Patient reports that he is still feeling a little sore in his back. He describes it as pressure sensation.          Medications: Outpatient Medications Prior to Visit  Medication Sig  . aspirin EC 81 MG tablet Take by mouth.  [name] ibuprofen (ADVIL) 600 MG tablet Take 1 tablet (600 mg total) by mouth every 8 (eight) hours as needed.  [name] amLODipine (NORVASC) 5 MG tablet Take 1 tablet (5 mg total) by mouth daily. (Patient not taking: Reported on 02/12/2020)  . doxycycline (VIBRA-TABS) 100 MG tablet Take 1 tablet (100 mg total) by mouth 2 (two) times daily. (Patient not taking: Reported on 02/12/2020)   No facility-administered medications prior to visit.    Review of Systems  Constitutional: Negative.   Musculoskeletal: Positive for arthralgias and myalgias.  Neurological: Negative for dizziness, weakness, light-headedness and headaches.      Objective    BP 128/76 (BP Location: Right Arm)   Pulse 72   Temp 97.9 F (36.6 C)   Resp 16   Ht 5\' 8"  (1.727 m)   Wt 158 lb (71.7 kg)   BMI 24.02 kg/m    Physical Exam Constitutional:      Appearance: Normal appearance.  Cardiovascular:     Rate and Rhythm: Normal rate and regular rhythm.     Pulses: Normal pulses.     Heart sounds: Normal heart sounds.  Pulmonary:     Effort:  Pulmonary effort is normal.     Breath sounds: Normal breath sounds.  Musculoskeletal:        General: Normal range of motion.  Skin:    General: Skin is warm and dry.  Neurological:     Mental Status: He is alert and oriented to person, place, and time. Mental status is at baseline.  Psychiatric:        Mood and Affect: Mood normal.        Behavior: Behavior normal.       No results found for any visits on 02/12/20.  Assessment & Plan     1. Multiple falls  Doing better, reduced alcohol intake. Follow up PRN.        ITrinna Post, PA-C, have reviewed all documentation for this visit. The documentation on 02/15/20 for the exam, diagnosis, procedures, and orders are all accurate and complete.  I have spent 15 minutes with this patient, >50% of which was spent on counseling and coordination of care.     Paulene Floor  [facility] [phone] (phone) [phone] (fax)  [place]

## 2020-02-12 ENCOUNTER — Other Ambulatory Visit: Payer: Self-pay

## 2020-02-12 ENCOUNTER — Ambulatory Visit: Payer: 59 | Admitting: Physician Assistant

## 2020-02-12 ENCOUNTER — Encounter: Payer: Self-pay | Admitting: Physician Assistant

## 2020-02-12 VITALS — BP 128/76 | HR 72 | Temp 97.9°F | Resp 16 | Ht 68.0 in | Wt 158.0 lb

## 2020-02-12 DIAGNOSIS — R296 Repeated falls: Secondary | ICD-10-CM

## 2020-02-18 ENCOUNTER — Ambulatory Visit (INDEPENDENT_AMBULATORY_CARE_PROVIDER_SITE_OTHER): Payer: 59 | Admitting: Physician Assistant

## 2020-02-18 ENCOUNTER — Encounter: Payer: Self-pay | Admitting: Physician Assistant

## 2020-02-18 ENCOUNTER — Other Ambulatory Visit: Payer: Self-pay

## 2020-02-18 VITALS — BP 142/102 | HR 74 | Temp 97.1°F | Ht 68.0 in | Wt 162.2 lb

## 2020-02-18 DIAGNOSIS — Z125 Encounter for screening for malignant neoplasm of prostate: Secondary | ICD-10-CM | POA: Diagnosis not present

## 2020-02-18 DIAGNOSIS — E785 Hyperlipidemia, unspecified: Secondary | ICD-10-CM

## 2020-02-18 DIAGNOSIS — I10 Essential (primary) hypertension: Secondary | ICD-10-CM

## 2020-02-18 DIAGNOSIS — Z0000 Encounter for general adult medical examination without abnormal findings: Secondary | ICD-10-CM | POA: Diagnosis not present

## 2020-02-18 NOTE — Progress Notes (Signed)
Complete physical exam   Patient: [name]   DOB: 12-27-1960   59 y.o. Male  MRN: [id number] Visit Date: 02/18/2020  Today's healthcare provider: Trinna Post, PA-C   Chief Complaint  Patient presents with  . Annual Exam  I,[name] M [name],acting as a scribe for Trinna Post, PA-C.,have documented all relevant documentation on the behalf of Trinna Post, PA-C,as directed by  Trinna Post, PA-C while in the presence of Trinna Post, PA-C.  Subjective    [name] is a 59 y.o. male who presents today for a complete physical exam.  He reports consuming a low fat and low sodium diet. The patient does not participate in regular exercise at present. He generally feels well. He reports sleeping well. He does have additional problems to discuss today.  HPI  Hypertension, follow-up  BP Readings from Last 3 Encounters:  02/18/20 (!) 142/102  02/12/20 128/76  02/02/20 (!) 141/82   Wt Readings from Last 3 Encounters:  02/18/20 162 lb 3.2 oz (73.6 kg)  02/12/20 158 lb (71.7 kg)  12/08/19 159 lb (72.1 kg)     He was last seen for hypertension 9 months ago.  BP at that visit was not checked due to virtual visit. Management since that visit includes patient was advised to started Amlodipine 5 MG daily .  He reports poor compliance with treatment. Patient reports he stopped taking his Amlodipine 5 MG.  He is not having side effects.  He is following a Regular diet. He is not exercising. He does not smoke.  Use of agents associated with hypertension: none.   Outside blood pressures are not being checked. Symptoms: No chest pain No chest pressure  No palpitations No syncope  No dyspnea No orthopnea  No paroxysmal nocturnal dyspnea No lower extremity edema   Pertinent labs: Lab Results  Component Value Date   CHOL 180 02/18/2020   HDL 64 02/18/2020   LDLCALC 96 02/18/2020   TRIG 114 02/18/2020   CHOLHDL 2.8 02/18/2020   Lab Results    Component Value Date   NA 139 02/18/2020   K 4.7 02/18/2020   CREATININE 0.82 02/18/2020   GFRNONAA 97 02/18/2020   GFRAA 112 02/18/2020   GLUCOSE 95 02/18/2020     The 10-year ASCVD risk score [name] DC Jr., et al., 2013) is: 8.3%   ---------------------------------------------------------------------------------------------------   Patient reports having right lower abdominal pain. Pain level is 3/10 and states that the pain started after his fall 3 weeks ago.    No past medical history on file. Past Surgical History:  Procedure Laterality Date  . LEG SURGERY     Social History   Socioeconomic History  . Marital status: Married    Spouse name: Not on file  . Number of children: Not on file  . Years of education: Not on file  . Highest education level: Not on file  Occupational History  . Not on file  Tobacco Use  . Smoking status: Never Smoker  . Smokeless tobacco: Never Used  Substance and Sexual Activity  . Alcohol use: Yes    Alcohol/week: 3.0 standard drinks    Types: 3 Cans of beer per week  . Drug use: No  . Sexual activity: Not on file  Other Topics Concern  . Not on file  Social History Narrative  . Not on file   Social Determinants of Health   Financial Resource Strain:   . Difficulty of  Paying Living Expenses:   Food Insecurity:   . Worried About Charity fundraiser in the Last Year:   . Arboriculturist in the Last Year:   Transportation Needs:   . Film/video editor (Medical):   [name] Lack of Transportation (Non-Medical):   Physical Activity:   . Days of Exercise per Week:   . Minutes of Exercise per Session:   Stress:   . Feeling of Stress :   Social Connections:   . Frequency of Communication with Friends and Family:   . Frequency of Social Gatherings with Friends and Family:   . Attends Religious Services:   . Active Member of Clubs or Organizations:   . Attends Archivist Meetings:   [name] Marital Status:   Intimate Partner  Violence:   . Fear of Current or Ex-Partner:   . Emotionally Abused:   [name] Physically Abused:   . Sexually Abused:    Family Status  Relation Name Status  . Mother  Alive, age 14y  . Father  Deceased at age 47  . Sister  Alive  . Sister  Alive   Family History  Problem Relation Age of Onset  . Breast cancer Mother   . Thyroid cancer Mother   . Lymphoma Father   . CVA Father   . Heart attack Sister   . Hypertension Sister    No Known Allergies  Patient Care Team: Virginia Crews, MD as PCP - General (Family Medicine)   Medications: Outpatient Medications Prior to Visit  Medication Sig  . aspirin EC 81 MG tablet Take by mouth.  [name] ibuprofen (ADVIL) 600 MG tablet Take 1 tablet (600 mg total) by mouth every 8 (eight) hours as needed.  [name] amLODipine (NORVASC) 5 MG tablet Take 1 tablet (5 mg total) by mouth daily. (Patient not taking: Reported on 02/12/2020)  . [DISCONTINUED] doxycycline (VIBRA-TABS) 100 MG tablet Take 1 tablet (100 mg total) by mouth 2 (two) times daily. (Patient not taking: Reported on 02/12/2020)   No facility-administered medications prior to visit.    Review of Systems    Objective    BP (!) 142/102 (BP Location: Left Arm, Patient Position: Sitting, Cuff Size: Normal)   Pulse 74   Temp (!) 97.1 F (36.2 C) (Temporal)   Ht 5\' 8"  (1.727 m)   Wt 162 lb 3.2 oz (73.6 kg)   SpO2 99%   BMI 24.66 kg/m    Physical Exam Constitutional:      Appearance: Normal appearance.  HENT:     Right Ear: Tympanic membrane, ear canal and external ear normal.     Left Ear: Tympanic membrane, ear canal and external ear normal.  Cardiovascular:     Rate and Rhythm: Normal rate and regular rhythm.     Pulses: Normal pulses.     Heart sounds: Normal heart sounds.  Pulmonary:     Effort: Pulmonary effort is normal.     Breath sounds: Normal breath sounds.  Abdominal:     General: Abdomen is flat. Bowel sounds are normal.     Palpations: Abdomen is soft.  Skin:     General: Skin is warm and dry.  Neurological:     General: No focal deficit present.     Mental Status: He is alert and oriented to person, place, and time.  Psychiatric:        Mood and Affect: Mood normal.        Behavior: Behavior normal.  Last depression screening scores PHQ 2/9 Scores 02/18/2020 04/30/2018 04/30/2018  PHQ - 2 Score 0 0 0  PHQ- 9 Score 2 - -   Last fall risk screening Fall Risk  02/18/2020  Falls in the past year? 1  Number falls in past yr: 1  Injury with Fall? 1  Risk for fall due to : -  Risk for fall due to: Comment -  Follow up -   Last Audit-C alcohol use screening Alcohol Use Disorder Test (AUDIT) 02/18/2020  1. How often do you have a drink containing alcohol? 1  2. How many drinks containing alcohol do you have on a typical day when you are drinking? 0  3. How often do you have six or more drinks on one occasion? 0  AUDIT-C Score 1   A score of 3 or more in women, and 4 or more in men indicates increased risk for alcohol abuse, EXCEPT if all of the points are from question 1   Results for orders placed or performed in visit on 02/18/20  HIV Antibody (routine testing w rflx)  Result Value Ref Range   HIV Screen 4th Generation wRfx Non Reactive Non Reactive  TSH  Result Value Ref Range   TSH 1.370 0.450 - 4.500 uIU/mL  Lipid panel  Result Value Ref Range   Cholesterol, Total 180 100 - 199 mg/dL   Triglycerides 114 0 - 149 mg/dL   HDL 64 >39 mg/dL   VLDL Cholesterol Cal 20 5 - 40 mg/dL   LDL Chol Calc (NIH) 96 0 - 99 mg/dL   Chol/HDL Ratio 2.8 0.0 - 5.0 ratio  Comprehensive metabolic panel  Result Value Ref Range   Glucose 95 65 - 99 mg/dL   BUN 10 6 - 24 mg/dL   Creatinine, Ser 0.82 0.76 - 1.27 mg/dL   GFR calc non Af Amer 97 >59 mL/min/1.73   GFR calc Af Amer 112 >59 mL/min/1.73   BUN/Creatinine Ratio 12 9 - 20   Sodium 139 134 - 144 mmol/L   Potassium 4.7 3.5 - 5.2 mmol/L   Chloride 102 96 - 106 mmol/L   CO2 22 20 - 29 mmol/L    Calcium 9.2 8.7 - 10.2 mg/dL   Total Protein 6.6 6.0 - 8.5 g/dL   Albumin 4.3 3.8 - 4.9 g/dL   Globulin, Total 2.3 1.5 - 4.5 g/dL   Albumin/Globulin Ratio 1.9 1.2 - 2.2   Bilirubin Total 0.8 0.0 - 1.2 mg/dL   Alkaline Phosphatase 178 (H) 48 - 121 IU/L   AST 19 0 - 40 IU/L   ALT 14 0 - 44 IU/L  CBC with Differential/Platelet  Result Value Ref Range   WBC 6.9 3.4 - 10.8 x10E3/uL   RBC 4.94 4.14 - 5.80 x10E6/uL   Hemoglobin 15.3 13.0 - 17.7 g/dL   Hematocrit 44.7 37.5 - 51.0 %   MCV 91 79 - 97 fL   MCH 31.0 26.6 - 33.0 pg   MCHC 34.2 31 - 35 g/dL   RDW 12.9 11.6 - 15.4 %   Platelets 266 150 - 450 x10E3/uL   Neutrophils 63 Not Estab. %   Lymphs 29 Not Estab. %   Monocytes 6 Not Estab. %   Eos 1 Not Estab. %   Basos 1 Not Estab. %   Neutrophils Absolute 4.4 1 - 7 x10E3/uL   Lymphocytes Absolute 2.0 0 - 3 x10E3/uL   Monocytes Absolute 0.4 0 - 0 x10E3/uL   EOS (ABSOLUTE) 0.1 0.0 -  0.4 x10E3/uL   Basophils Absolute 0.0 0 - 0 x10E3/uL   Immature Granulocytes 0 Not Estab. %   Immature Grans (Abs) 0.0 0.0 - 0.1 x10E3/uL  Hepatitis C antibody  Result Value Ref Range   Hep C Virus Ab <0.1 0.0 - 0.9 s/co ratio  Hemoglobin A1c  Result Value Ref Range   Hgb A1c MFr Bld 5.7 (H) 4.8 - 5.6 %   Est. average glucose Bld gHb Est-mCnc 117 mg/dL  PSA  Result Value Ref Range   Prostate Specific Ag, Serum 1.0 0.0 - 4.0 ng/mL    Assessment & Plan    1. Annual physical exam  - HIV Antibody (routine testing w rflx) - TSH - Lipid panel - Comprehensive metabolic panel - CBC with Differential/Platelet - Hepatitis C antibody - Hemoglobin A1c  2. Screening for prostate cancer  - PSA  3. Essential hypertension Uncontrolled due to patient stop take medication. Continue current medications Recheck metabolic panel  4. Hyperlipidemia  CVD risk 8.3%. Would recommend statin.   Routine Health Maintenance and Physical Exam  Exercise Activities and Dietary recommendations Goals   None       Immunization History  Administered Date(s) Administered  . Td 06/12/2017  . Tdap 05/26/2007  . Zoster Recombinat (Shingrix) 05/04/2019    Health Maintenance  Topic Date Due  . INFLUENZA VACCINE  03/20/2020  . COLONOSCOPY  03/01/2024  . TETANUS/TDAP  06/13/2027  . Hepatitis C Screening  Completed  . HIV Screening  Completed    Discussed health benefits of physical activity, and encouraged him to engage in regular exercise appropriate for his age and condition.    Return in about 1 year (around 02/17/2021) for cpe.     ITrinna Post, PA-C, have reviewed all documentation for this visit. The documentation on 02/19/20 for the exam, diagnosis, procedures, and orders are all accurate and complete.    Paulene Floor  [facility] [phone] (phone) [phone] (fax)  [place]

## 2020-02-18 NOTE — Patient Instructions (Signed)
Preventive Care 41-60 Years Old, Male Preventive care refers to lifestyle choices and visits with your health care provider that can promote health and wellness. This includes:  A yearly physical exam. This is also called an annual well check.  Regular dental and eye exams.  Immunizations.  Screening for certain conditions.  Healthy lifestyle choices, such as eating a healthy diet, getting regular exercise, not using drugs or products that contain nicotine and tobacco, and limiting alcohol use. What can I expect for my preventive care visit? Physical exam Your health care provider will check:  Height and weight. These may be used to calculate body mass index (BMI), which is a measurement that tells if you are at a healthy weight.  Heart rate and blood pressure.  Your skin for abnormal spots. Counseling Your health care provider may ask you questions about:  Alcohol, tobacco, and drug use.  Emotional well-being.  Home and relationship well-being.  Sexual activity.  Eating habits.  Work and work Statistician. What immunizations do I need?  Influenza (flu) vaccine  This is recommended every year. Tetanus, diphtheria, and pertussis (Tdap) vaccine  You may need a Td booster every 10 years. Varicella (chickenpox) vaccine  You may need this vaccine if you have not already been vaccinated. Zoster (shingles) vaccine  You may need this after age 64. Measles, mumps, and rubella (MMR) vaccine  You may need at least one dose of MMR if you were born in 1957 or later. You may also need a second dose. Pneumococcal conjugate (PCV13) vaccine  You may need this if you have certain conditions and were not previously vaccinated. Pneumococcal polysaccharide (PPSV23) vaccine  You may need one or two doses if you smoke cigarettes or if you have certain conditions. Meningococcal conjugate (MenACWY) vaccine  You may need this if you have certain conditions. Hepatitis A  vaccine  You may need this if you have certain conditions or if you travel or work in places where you may be exposed to hepatitis A. Hepatitis B vaccine  You may need this if you have certain conditions or if you travel or work in places where you may be exposed to hepatitis B. Haemophilus influenzae type b (Hib) vaccine  You may need this if you have certain risk factors. Human papillomavirus (HPV) vaccine  If recommended by your health care provider, you may need three doses over 6 months. You may receive vaccines as individual doses or as more than one vaccine together in one shot (combination vaccines). Talk with your health care provider about the risks and benefits of combination vaccines. What tests do I need? Blood tests  Lipid and cholesterol levels. These may be checked every 5 years, or more frequently if you are over 60 years old.  Hepatitis C test.  Hepatitis B test. Screening  Lung cancer screening. You may have this screening every year starting at age 43 if you have a 30-pack-year history of smoking and currently smoke or have quit within the past 15 years.  Prostate cancer screening. Recommendations will vary depending on your family history and other risks.  Colorectal cancer screening. All adults should have this screening starting at age 72 and continuing until age 2. Your health care provider may recommend screening at age 14 if you are at increased risk. You will have tests every 1-10 years, depending on your results and the type of screening test.  Diabetes screening. This is done by checking your blood sugar (glucose) after you have not eaten  for a while (fasting). You may have this done every 1-3 years.  Sexually transmitted disease (STD) testing. Follow these instructions at home: Eating and drinking  Eat a diet that includes fresh fruits and vegetables, whole grains, lean protein, and low-fat dairy products.  Take vitamin and mineral supplements as  recommended by your health care provider.  Do not drink alcohol if your health care provider tells you not to drink.  If you drink alcohol: ? Limit how much you have to 0-2 drinks a day. ? Be aware of how much alcohol is in your drink. In the [place] one drink equals one 12 oz bottle of beer (355 mL), one 5 oz glass of wine (148 mL), or one 1 oz glass of hard liquor (44 mL). Lifestyle  Take daily care of your teeth and gums.  Stay active. Exercise for at least 30 minutes on 5 or more days each week.  Do not use any products that contain nicotine or tobacco, such as cigarettes, e-cigarettes, and chewing tobacco. If you need help quitting, ask your health care provider.  If you are sexually active, practice safe sex. Use a condom or other form of protection to prevent STIs (sexually transmitted infections).  Talk with your health care provider about taking a low-dose aspirin every day starting at age 53. What's next?  Go to your health care provider once a year for a well check visit.  Ask your health care provider how often you should have your eyes and teeth checked.  Stay up to date on all vaccines. This information is not intended to replace advice given to you by your health care provider. Make sure you discuss any questions you have with your health care provider. Document Revised: 07/31/2018 Document Reviewed: 07/31/2018 Elsevier Patient Education  2020 [employer].

## 2020-02-19 LAB — HIV ANTIBODY (ROUTINE TESTING W REFLEX): HIV Screen 4th Generation wRfx: NONREACTIVE

## 2020-02-19 LAB — CBC WITH DIFFERENTIAL/PLATELET
Basophils Absolute: 0 10*3/uL (ref 0.0–0.2)
Basos: 1 %
EOS (ABSOLUTE): 0.1 10*3/uL (ref 0.0–0.4)
Eos: 1 %
Hematocrit: 44.7 % (ref 37.5–51.0)
Hemoglobin: 15.3 g/dL (ref 13.0–17.7)
Immature Grans (Abs): 0 10*3/uL (ref 0.0–0.1)
Immature Granulocytes: 0 %
Lymphocytes Absolute: 2 10*3/uL (ref 0.7–3.1)
Lymphs: 29 %
MCH: 31 pg (ref 26.6–33.0)
MCHC: 34.2 g/dL (ref 31.5–35.7)
MCV: 91 fL (ref 79–97)
Monocytes Absolute: 0.4 10*3/uL (ref 0.1–0.9)
Monocytes: 6 %
Neutrophils Absolute: 4.4 10*3/uL (ref 1.4–7.0)
Neutrophils: 63 %
Platelets: 266 10*3/uL (ref 150–450)
RBC: 4.94 x10E6/uL (ref 4.14–5.80)
RDW: 12.9 % (ref 11.6–15.4)
WBC: 6.9 10*3/uL (ref 3.4–10.8)

## 2020-02-19 LAB — COMPREHENSIVE METABOLIC PANEL
ALT: 14 IU/L (ref 0–44)
AST: 19 IU/L (ref 0–40)
Albumin/Globulin Ratio: 1.9 (ref 1.2–2.2)
Albumin: 4.3 g/dL (ref 3.8–4.9)
Alkaline Phosphatase: 178 IU/L — ABNORMAL HIGH (ref 48–121)
BUN/Creatinine Ratio: 12 (ref 9–20)
BUN: 10 mg/dL (ref 6–24)
Bilirubin Total: 0.8 mg/dL (ref 0.0–1.2)
CO2: 22 mmol/L (ref 20–29)
Calcium: 9.2 mg/dL (ref 8.7–10.2)
Chloride: 102 mmol/L (ref 96–106)
Creatinine, Ser: 0.82 mg/dL (ref 0.76–1.27)
GFR calc Af Amer: 112 mL/min/{1.73_m2} (ref >59)
GFR calc non Af Amer: 97 mL/min/{1.73_m2} (ref >59)
Globulin, Total: 2.3 g/dL (ref 1.5–4.5)
Glucose: 95 mg/dL (ref 65–99)
Potassium: 4.7 mmol/L (ref 3.5–5.2)
Sodium: 139 mmol/L (ref 134–144)
Total Protein: 6.6 g/dL (ref 6.0–8.5)

## 2020-02-19 LAB — HEPATITIS C ANTIBODY: Hep C Virus Ab: <0.1 s/co ratio (ref 0.0–0.9)

## 2020-02-19 LAB — LIPID PANEL
Chol/HDL Ratio: 2.8 ratio (ref 0.0–5.0)
Cholesterol, Total: 180 mg/dL (ref 100–199)
HDL: 64 mg/dL (ref >39)
LDL Chol Calc (NIH): 96 mg/dL (ref 0–99)
Triglycerides: 114 mg/dL (ref 0–149)
VLDL Cholesterol Cal: 20 mg/dL (ref 5–40)

## 2020-02-19 LAB — PSA: Prostate Specific Ag, Serum: 1 ng/mL (ref 0.0–4.0)

## 2020-02-19 LAB — HEMOGLOBIN A1C
Est. average glucose Bld gHb Est-mCnc: 117 mg/dL
Hgb A1c MFr Bld: 5.7 % — ABNORMAL HIGH (ref 4.8–5.6)

## 2020-02-19 LAB — TSH: TSH: 1.37 u[IU]/mL (ref 0.450–4.500)

## 2020-04-23 ENCOUNTER — Encounter: Payer: Self-pay | Admitting: Physician Assistant

## 2020-10-31 ENCOUNTER — Telehealth: Payer: Self-pay

## 2020-10-31 NOTE — Telephone Encounter (Signed)
Copied from [place] [id number]. Topic: General - Call Back - No Documentation >> Oct 31, 2020  2:50 PM Erick Blinks wrote: Dr. Tressie Stalker from [place] called to report that the patient may need an MRI,   Pt is rolling his ankle for the past couple of months, increased urgency in bowel habits in the morning for a month or so. Lumbar spine potentially   Best contact: [phone] (practice)  Best contact: [phone] (pt's wife)

## 2020-11-01 ENCOUNTER — Ambulatory Visit (INDEPENDENT_AMBULATORY_CARE_PROVIDER_SITE_OTHER): Payer: 59 | Admitting: Physician Assistant

## 2020-11-01 ENCOUNTER — Other Ambulatory Visit: Payer: Self-pay

## 2020-11-01 VITALS — BP 160/89 | HR 86 | Temp 97.9°F | Wt 164.0 lb

## 2020-11-01 DIAGNOSIS — M5416 Radiculopathy, lumbar region: Secondary | ICD-10-CM | POA: Diagnosis not present

## 2020-11-01 DIAGNOSIS — R29898 Other symptoms and signs involving the musculoskeletal system: Secondary | ICD-10-CM | POA: Diagnosis not present

## 2020-11-01 DIAGNOSIS — Z5329 Procedure and treatment not carried out because of patient's decision for other reasons: Secondary | ICD-10-CM

## 2020-11-01 NOTE — Telephone Encounter (Signed)
Would need to be seen to evaluate for possible need for MRI.  Cannot be ordered based on chiropractor recs.  Could also refer to Ortho if he would like eval there.

## 2020-11-01 NOTE — Patient Instructions (Signed)
Radicular Pain Radicular pain is a type of pain that spreads from your back or neck along a spinal nerve. Spinal nerves are nerves that leave the spinal cord and go to the muscles. Radicular pain is sometimes called radiculopathy, radiculitis, or a pinched nerve. When you have this type of pain, you may also have weakness, numbness, or tingling in the area of your body that is supplied by the nerve. The pain may feel sharp and burning. Depending on which spinal nerve is affected, the pain may occur in the:  Neck area (cervical radicular pain). You may also feel pain, numbness, weakness, or tingling in the arms.  Mid-spine area (thoracic radicular pain). You would feel this pain in the back and chest. This type is rare.  Lower back area (lumbar radicular pain). You would feel this pain as low back pain. You may feel pain, numbness, weakness, or tingling in the buttocks or legs. Sciatica is a type of lumbar radicular pain that shoots down the back of the leg. Radicular pain occurs when one of the spinal nerves becomes irritated or squeezed (compressed). It is often caused by something pushing on a spinal nerve, such as one of the bones of the spine (vertebrae) or one of the round cushions between vertebrae (intervertebral disks). This can result from:  An injury.  Wear and tear or aging of a disk.  The growth of a bone spur that pushes on the nerve. Radicular pain often goes away when you follow instructions from your health care provider for relieving pain at home. Follow these instructions at home: Managing pain  If directed, put ice on the affected area: ? Put ice in a plastic bag. ? Place a towel between your skin and the bag. ? Leave the ice on for 20 minutes, 2-3 times a day.  If directed, apply heat to the affected area as often as told by your health care provider. Use the heat source that your health care provider recommends, such as a moist heat pack or a heating pad. ? Place a towel  between your skin and the heat source. ? Leave the heat on for 20-30 minutes. ? Remove the heat if your skin turns bright red. This is especially important if you are unable to feel pain, heat, or cold. You may have a greater risk of getting burned.      Activity  Do not sit or rest in bed for long periods of time.  Try to stay as active as possible. Ask your health care provider what type of exercise or activity is best for you.  Avoid activities that make your pain worse, such as bending and lifting.  Do not lift anything that is heavier than 10 lb (4.5 kg), or the limit that you are told, until your health care provider says that it is safe.  Practice using proper technique when lifting items. Proper lifting technique involves bending your knees and rising up.  Do strength and range-of-motion exercises only as told by your health care provider or physical therapist.   General instructions  Take over-the-counter and prescription medicines only as told by your health care provider.  Pay attention to any changes in your symptoms.  Keep all follow-up visits as told by your health care provider. This is important. ? Your health care provider may send you to a physical therapist to help with this pain. Contact a health care provider if:  Your pain and other symptoms get worse.  Your pain medicine   is not helping.  Your pain has not improved after a few weeks of home care.  You have a fever. Get help right away if:  You have severe pain, weakness, or numbness.  You have difficulty with bladder or bowel control. Summary  Radicular pain is a type of pain that spreads from your back or neck along a spinal nerve.  When you have radicular pain, you may also have weakness, numbness, or tingling in the area of your body that is supplied by the nerve.  The pain may feel sharp or burning.  Radicular pain may be treated with ice, heat, medicines, or physical therapy. This  information is not intended to replace advice given to you by your health care provider. Make sure you discuss any questions you have with your health care provider. Document Revised: 02/18/2018 Document Reviewed: 02/18/2018 Elsevier Patient Education  2021 Elsevier Inc.  

## 2020-11-01 NOTE — Progress Notes (Signed)
Patient arrive twenty minutes late and was scheduled at a later appointment on 11/01/2020. Please refer to that note.

## 2020-11-01 NOTE — Progress Notes (Signed)
Established patient visit   Patient: [name]   DOB: 22-Apr-1961   60 y.o. Male  MRN: [id number] Visit Date: 11/01/2020  Today's healthcare provider: Trinna Post, PA-C   Chief Complaint  Patient presents with  . Toe Pain  . Extremity Weakness   Subjective    HPI   Patient presents today with left foot and toe pain. He reports that he is having difficulty lifting his left toes and for 6-8 months. He reports he notices this when he had difficulty walking up the stairs. Also reports he has been rolling his left foot when he was walking in the woods. He upgraded to lower shows which helped him with his symptoms. He reports he does have low back pain which is intermittent but can be helped with stretching. He had a lumbar spine xray 01/2020 which showed the following:   "Grade 1 anterolisthesis of L5-S1 secondary to bilateral L5 spondylolysis. Severe degenerative disc disease is noted at L5-S1. No acute abnormality seen in the lumbar spine."       Medications: Outpatient Medications Prior to Visit  Medication Sig  . amLODipine (NORVASC) 5 MG tablet Take 1 tablet (5 mg total) by mouth daily.  [name] aspirin EC 81 MG tablet Take by mouth.  [name] ibuprofen (ADVIL) 600 MG tablet Take 1 tablet (600 mg total) by mouth every 8 (eight) hours as needed.   No facility-administered medications prior to visit.    Review of Systems     Objective    There were no vitals taken for this visit.    Physical Exam Constitutional:      Appearance: Normal appearance.  Cardiovascular:     Rate and Rhythm: Normal rate and regular rhythm.  Feet:     Comments: 5/5 strength right lower extremity normal. 5/5 strength on left lower extremity except 4/5 strength on great toe extension  Skin:    General: Skin is warm and dry.  Neurological:     Mental Status: He is alert and oriented to person, place, and time. Mental status is at baseline.  Psychiatric:        Mood and Affect: Mood  normal.        Behavior: Behavior normal.       No results found for any visits on 11/01/20.  Assessment & Plan    1. Lumbar radiculopathy  Reviewed lumbar spine xray 01/2020 which showed lumbar DDD presenting today with acute on chronic back pain with more recent progression into left foot weakness, particularly left great toe extensor weakness. Suspect compression at L5 which would be in line with his most recent xray. Explained stepwise workup of this including repeat Xray, MRI, oral medications, injections and possible surgical intervention. Will refer to [facility] Dr. Candelaria Stagers as his family member has prior positive experience with this. Placed referral today and advised to reach out if not contacted.   2. Weakness of left foot    Return if symptoms worsen or fail to improve.      ITrinna Post, PA-C, have reviewed all documentation for this visit. The documentation on 11/01/20 for the exam, diagnosis, procedures, and orders are all accurate and complete.   I spent 20 minutes dedicated to the care of this patient on the date of this encounter to include pre-visit review of records, face-to-face time with the patient discussing lumbar radiculopathy  , and post visit ordering of testing.   Trinna Post, PA-C    Family Practice [phone] (phone) [phone] (fax)  [place]

## 2020-11-08 ENCOUNTER — Telehealth: Payer: Self-pay

## 2020-11-08 NOTE — Telephone Encounter (Signed)
Copied from [place] [id number]. Topic: General - Other >> Nov 08, 2020  4:29 PM Celene Kras wrote: Reason for CRM: Pt called and is requesting to be referred to an orthopedic doctor somewhere other than [facility] clinic. Please advise.

## 2020-11-10 NOTE — Telephone Encounter (Signed)
Ok to place the referral to [facility]

## 2020-11-11 NOTE — Telephone Encounter (Addendum)
Patient was seen by [facility] today and would like the covering doctor to review last April imaging report of his lower back and compare it to today imaging report. Patient was last seen by Lawrence Santiago, PA on 11/01/2020.

## 2020-11-16 NOTE — Telephone Encounter (Signed)
[facility] images will not be in our system. You can read him the report from our last imaging though

## 2020-11-16 NOTE — Telephone Encounter (Signed)
Please advise on x-rays.

## 2020-11-18 NOTE — Telephone Encounter (Signed)
Patient's wife advised as below. 

## 2020-12-01 ENCOUNTER — Encounter: Payer: Self-pay | Admitting: Adult Health

## 2020-12-12 ENCOUNTER — Ambulatory Visit: Payer: Self-pay

## 2020-12-12 ENCOUNTER — Telehealth: Payer: 59 | Admitting: Physician Assistant

## 2020-12-12 ENCOUNTER — Encounter: Payer: Self-pay | Admitting: Physician Assistant

## 2020-12-12 DIAGNOSIS — G43809 Other migraine, not intractable, without status migrainosus: Secondary | ICD-10-CM

## 2020-12-12 MED ORDER — RIZATRIPTAN BENZOATE 10 MG PO TABS: 10.0000 mg | ORAL_TABLET | ORAL | 0 refills | Status: DC | PRN

## 2020-12-12 NOTE — Patient Instructions (Signed)
Vertigo Vertigo is the feeling that you or your surroundings are moving when they are not. This feeling can come and go at any time. Vertigo often goes away on its own. Vertigo can be dangerous if it occurs while you are doing something that could endanger you or others, such as driving or operating machinery. Your health care provider will do tests to determine the cause of your vertigo. Tests will also help your health care provider decide how best to treat your condition. Follow these instructions at home: Eating and drinking  Drink enough fluid to keep your urine pale yellow.  Do not drink alcohol.      Activity  Return to your normal activities as told by your health care provider. Ask your health care provider what activities are safe for you.  In the morning, first sit up on the side of the bed. When you feel okay, stand slowly while you hold onto something until you know that your balance is fine.  Move slowly. Avoid sudden body or head movements or certain positions, as told by your health care provider.  If you have trouble walking or keeping your balance, try using a cane for stability. If you feel dizzy or unstable, sit down right away.  Avoid doing any tasks that would cause danger to you or others if vertigo occurs.  Avoid bending down if you feel dizzy. Place items in your home so that they are easy for you to reach without leaning over.  Do not drive or use heavy machinery if you feel dizzy. General instructions  Take over-the-counter and prescription medicines only as told by your health care provider.  Keep all follow-up visits as told by your health care provider. This is important. Contact a health care provider if:  Your medicines do not relieve your vertigo or they make it worse.  You have a fever.  Your condition gets worse or you develop new symptoms.  Your family or friends notice any behavioral changes.  Your nausea or vomiting gets worse.  You  have numbness or a prickling and tingling sensation in part of your body. Get help right away if you:  Have difficulty moving or speaking.  Are always dizzy.  Faint.  Develop severe headaches.  Have weakness in your hands, arms, or legs.  Have changes in your hearing or vision.  Develop a stiff neck.  Develop sensitivity to light. Summary  Vertigo is the feeling that you or your surroundings are moving when they are not.  Your health care provider will do tests to determine the cause of your vertigo.  Follow instructions for home care. You may be told to avoid certain tasks, positions, or movements.  Contact a health care provider if your medicines do not relieve your symptoms, or if you have a fever, nausea, vomiting, or changes in behavior.  Get help right away if you have severe headaches or difficulty speaking, or you develop hearing or vision problems. This information is not intended to replace advice given to you by your health care provider. Make sure you discuss any questions you have with your health care provider. Document Revised: 06/30/2018 Document Reviewed: 06/30/2018 Elsevier Patient Education  2021 [employer].

## 2020-12-12 NOTE — Progress Notes (Signed)
Mr. [name], [name] are scheduled for a virtual visit with your provider today.    Just as we do with appointments in the office, we must obtain your consent to participate.  Your consent will be active for this visit and any virtual visit you may have with one of our providers in the next 365 days.    If you have a MyChart account, I can also send a copy of this consent to you electronically.  All virtual visits are billed to your insurance company just like a traditional visit in the office.  As this is a virtual visit, video technology does not allow for your provider to perform a traditional examination.  This may limit your provider's ability to fully assess your condition.  If your provider identifies any concerns that need to be evaluated in person or the need to arrange testing such as labs, EKG, etc, we will make arrangements to do so.    Although advances in technology are sophisticated, we cannot ensure that it will always work on either your end or our end.  If the connection with a video visit is poor, we may have to switch to a telephone visit.  With either a video or telephone visit, we are not always able to ensure that we have a secure connection.   I need to obtain your verbal consent now.   Are you willing to proceed with your visit today?   [name] has provided verbal consent on 12/12/2020 for a virtual visit (video or telephone).   Mar Daring, PA-C 12/12/2020  3:52 PM     MyChart Video Visit    Virtual Visit via Video Note   This visit type was conducted due to national recommendations for restrictions regarding the COVID-19 Pandemic (e.g. social distancing) in an effort to limit this patient's exposure and mitigate transmission in our community. This patient is at least at moderate risk for complications without adequate follow up. This format is felt to be most appropriate for this patient at this time. Physical exam was limited by quality of the video and  audio technology used for the visit.   Patient location: Home Provider location: Home office in [place]  I discussed the limitations of evaluation and management by telemedicine and the availability of in person appointments. The patient expressed understanding and agreed to proceed.  Patient: [name]   DOB: 1961-04-04   60 y.o. Male  MRN: [id number] Visit Date: 12/12/2020  Today's healthcare provider: Mar Daring, PA-C   No chief complaint on file.  Subjective    Migraine  This is a recurrent problem. The current episode started today. The problem occurs constantly. The problem has been unchanged. The pain is located in the right unilateral region. The pain does not radiate. The pain quality is similar to prior headaches. The patient is experiencing no pain. Associated symptoms include dizziness, nausea and vomiting. Pertinent negatives include no numbness or weakness. The symptoms are aggravated by emotional stress. He has tried triptans and darkened room (lying down) for the symptoms. The treatment provided mild relief.    Patient has strong history of complex migraines. Initially had presented as ocular migraines when he was younger. Then in 2015 they transitioned to vestibular migraines. He underwent complete work up in 2015 through ENT and was even referred to [facility] and was found to have vestibular migraines. Has been treated with Rizatriptan successfully in the past. He reports his migraines have always been triggered  by stress. He does mention having increased emotional and physical stress recently and feels this has triggered the migraines again. They do have an old Rx of Rizatriptan but did not want to take it as it is over 76 years old.   Patient Active Problem List   Diagnosis Date Noted  . History of nausea- reported 01/27/2020 02/02/2020  . Accidental fall from ladder 02/02/2020  . Acute right-sided thoracic back pain 02/02/2020  . Neck stiffness  02/02/2020  . Concussion with no loss of consciousness 02/02/2020  . Multiple falls 02/02/2020  . Head trauma, initial encounter 02/02/2020  . Acute post-traumatic headache, not intractable 02/02/2020  . Essential hypertension 08/26/2018  . Pre-bariatric surgery nutrition evaluation 06/19/2018  . Migraine with vertigo 01/05/2014  . Arthralgia of multiple joints 04/28/2009  . Carpal tunnel syndrome 08/30/2008   No past medical history on file.    Medications: Outpatient Medications Prior to Visit  Medication Sig  . amLODipine (NORVASC) 5 MG tablet Take 1 tablet (5 mg total) by mouth daily.  [name] aspirin EC 81 MG tablet Take by mouth.  [name] ibuprofen (ADVIL) 600 MG tablet Take 1 tablet (600 mg total) by mouth every 8 (eight) hours as needed.   No facility-administered medications prior to visit.    Review of Systems  Respiratory: Negative.   Cardiovascular: Negative.   Gastrointestinal: Positive for nausea and vomiting.  Neurological: Positive for dizziness. Negative for facial asymmetry, weakness and numbness.    Last CBC Lab Results  Component Value Date   WBC 6.9 02/18/2020   HGB 15.3 02/18/2020   HCT 44.7 02/18/2020   MCV 91 02/18/2020   MCH 31.0 02/18/2020   RDW 12.9 02/18/2020   PLT 266 71/01/2693   Last metabolic panel Lab Results  Component Value Date   GLUCOSE 95 02/18/2020   NA 139 02/18/2020   K 4.7 02/18/2020   CL 102 02/18/2020   CO2 22 02/18/2020   BUN 10 02/18/2020   CREATININE 0.82 02/18/2020   GFRNONAA 97 02/18/2020   GFRAA 112 02/18/2020   CALCIUM 9.2 02/18/2020   PROT 6.6 02/18/2020   ALBUMIN 4.3 02/18/2020   LABGLOB 2.3 02/18/2020   AGRATIO 1.9 02/18/2020   BILITOT 0.8 02/18/2020   ALKPHOS 178 (H) 02/18/2020   AST 19 02/18/2020   ALT 14 02/18/2020   ANIONGAP 11 02/02/2020      Objective    There were no vitals taken for this visit. BP Readings from Last 3 Encounters:  11/01/20 (!) 160/89  02/18/20 (!) 142/102  02/12/20 128/76   Wt  Readings from Last 3 Encounters:  11/01/20 164 lb (74.4 kg)  02/18/20 162 lb 3.2 oz (73.6 kg)  02/12/20 158 lb (71.7 kg)      Physical Exam Vitals reviewed.  Constitutional:      General: He is not in acute distress.    Appearance: Normal appearance. He is well-developed. He is not ill-appearing.  HENT:     Head: Normocephalic and atraumatic.  Eyes:     Conjunctiva/sclera: Conjunctivae normal.  Pulmonary:     Effort: Pulmonary effort is normal. No respiratory distress.  Musculoskeletal:     Cervical back: Normal range of motion and neck supple.  Neurological:     Mental Status: He is alert.     Comments: Patient in no apparent distress, resting in bed, but not moving head much. No facial asymmetry noted. No unilateral weakness  Psychiatric:        Mood and Affect: Mood normal.  Behavior: Behavior normal.        Thought Content: Thought content normal.        Judgment: Judgment normal.       Assessment & Plan     1. Vestibular migraine - No worrisome signs or symptoms for CVA  - Recurrent issue, suspected due to recent stressors - Previously treated successful with Rizatriptan, will refill - Advised if dizziness persists or worsens to please seek in person care with UC or ER - rizatriptan (MAXALT) 10 MG tablet; Take 1 tablet (10 mg total) by mouth as needed for migraine. May repeat in 2 hours if needed  Dispense: 10 tablet; Refill: 0   No follow-ups on file.     I discussed the assessment and treatment plan with the patient. The patient was provided an opportunity to ask questions and all were answered. The patient agreed with the plan and demonstrated an understanding of the instructions.   The patient was advised to call back or seek an in-person evaluation if the symptoms worsen or if the condition fails to improve as anticipated.  I provided 20 minutes of non-face-to-face time during this encounter.  Reynolds Bowl, PA-C, have reviewed all  documentation for this visit. The documentation on 12/12/20 for the exam, diagnosis, procedures, and orders are all accurate and complete.  [place] [phone] (phone) [phone] (fax)  [place]

## 2020-12-12 NOTE — Telephone Encounter (Signed)
Cant get out of bed, dizziness, Referred to local ENT no longer there ENT- rizatriptan  refusing UCC or ED Advised pt's wife to call [phone]  - Called office and spoke with Jiles Garter and she advised Virtual visit through My Chart.  Assisted wife with setting up MyChart virtual visit. Pt denies blurred vision, weakness or arms/legs or face. Smile is symmetrical and no drift to arms. Pt talking in the background.

## 2020-12-13 ENCOUNTER — Encounter: Payer: Self-pay | Admitting: Physician Assistant

## 2020-12-18 DIAGNOSIS — U071 COVID-19: Secondary | ICD-10-CM

## 2020-12-18 HISTORY — DX: COVID-19: U07.1

## 2021-01-04 ENCOUNTER — Telehealth: Payer: 59 | Admitting: Family

## 2021-01-04 DIAGNOSIS — U071 COVID-19: Secondary | ICD-10-CM

## 2021-01-05 MED ORDER — BENZONATATE 100 MG PO CAPS: 100.0000 mg | ORAL_CAPSULE | Freq: Three times a day (TID) | ORAL | 0 refills | Status: DC | PRN

## 2021-01-05 MED ORDER — NIRMATRELVIR/RITONAVIR (PAXLOVID)TABLET: 3.0000 | ORAL_TABLET | Freq: Two times a day (BID) | ORAL | 0 refills | Status: AC

## 2021-01-05 MED ORDER — FLUTICASONE PROPIONATE 50 MCG/ACT NA SUSP: 2.0000 | Freq: Every day | NASAL | 6 refills | Status: DC

## 2021-01-05 NOTE — Progress Notes (Signed)
E-Visit for Positive Covid Test Result We are sorry you are not feeling well. We are here to help!  You have tested positive for COVID-19, meaning that you were infected with the novel coronavirus and could give the virus to others.  It is vitally important that you stay home so you do not spread it to others.      Please continue isolation at home, for at least 10 days since the start of your symptoms and until you have had 24 hours with no fever (without taking a fever reducer) and with improving of symptoms.  If you have no symptoms but tested positive (or all symptoms resolve after 5 days and you have no fever) you can leave your house but continue to wear a mask around others for an additional 5 days. If you have a fever,continue to stay home until you have had 24 hours of no fever. Most cases improve 5-10 days from onset but we have seen a small number of patients who have gotten worse after the 10 days.  Please be sure to watch for worsening symptoms and remain taking the proper precautions.   Go to the nearest hospital ED for assessment if fever/cough/breathlessness are severe or illness seems like a threat to life.    The following symptoms may appear 2-14 days after exposure: . Fever . Cough . Shortness of breath or difficulty breathing . Chills . Repeated shaking with chills . Muscle pain . Headache . Sore throat . New loss of taste or smell . Fatigue . Congestion or runny nose . Nausea or vomiting . Diarrhea  You have been enrolled in [facility] [facility] for COVID-19. Daily you will receive a questionnaire within the [facility] website. Our COVID-19 response team will be monitoring your responses daily.  You can use medication such as prescription cough medication called Tessalon Perles 100 mg. You may take 1-2 capsules every 8 hours as needed for cough and prescription for Fluticasone nasal spray 2 sprays in each nostril one time per day and Paxlovid prescription.   You  may also take acetaminophen (Tylenol) as needed for fever.  HOME CARE: . Only take medications as instructed by your medical team. . Drink plenty of fluids and get plenty of rest. . A steam or ultrasonic humidifier can help if you have congestion.   GET HELP RIGHT AWAY IF YOU HAVE EMERGENCY WARNING SIGNS.  Call 911 or proceed to your closest emergency facility if: . You develop worsening high fever. . Trouble breathing . Bluish lips or face . Persistent pain or pressure in the chest . New confusion . Inability to wake or stay awake . You cough up blood. . Your symptoms become more severe . Inability to hold down food or fluids  This list is not all possible symptoms. Contact your medical provider for any symptoms that are severe or concerning to you.    Your e-visit answers were reviewed by a board certified advanced clinical practitioner to complete your personal care plan.  Depending on the condition, your plan could have included both over the counter or prescription medications.  If there is a problem please reply once you have received a response from your provider.  Your safety is important to [place].  If you have drug allergies check your prescription carefully.    You can use MyChart to ask questions about today's visit, request a non-urgent call back, or ask for a work or school excuse for 24 hours related to this e-Visit.  If it has been greater than 24 hours you will need to follow up with your provider, or enter a new e-Visit to address those concerns. You will get an e-mail in the next two days asking about your experience.  I hope that your e-visit has been valuable and will speed your recovery. Thank you for using e-visits.     Approximately 5 minutes was spent documenting and reviewing patient's chart.

## 2021-01-18 ENCOUNTER — Telehealth: Payer: Self-pay | Admitting: Family Medicine

## 2021-01-18 NOTE — Telephone Encounter (Signed)
Patient was dx with COVID on 01/19/2021 and would like nirmatrelvir/ritonavir EUA (PAXLOVID) TABS. Caller states she is immunocompromised and this would be patient second time getting COVID within in 2 weeks.  The 1st time patient was dx with COVID  on 02/03/2021 he had a tele health e visit  and  was prescribed PAXLOVID then he took a PCR test and was negative. Patient started to experiencing a week later a  sore throat congestion and then took a COVID test which came back positive, caller is requesting PCP to prescribe nirmatrelvir/ritonavir EUA (PAXLOVID) TABS. Best # to follow up on [place] Phone:  [phone]  Fax:  [phone]

## 2021-01-18 NOTE — Telephone Encounter (Signed)
Advised patient's wife as below.  

## 2021-01-18 NOTE — Telephone Encounter (Signed)
Patient wife called in she is anxiously awaiting the answer from Dr B in regards to messages left earlier today. Asking for a call back please at Ph# [phone]

## 2021-01-18 NOTE — Telephone Encounter (Signed)
No. We should not repeat Paxlovid this close to last dose

## 2021-01-18 NOTE — Telephone Encounter (Signed)
Patient is wanting to know does he need to take another round of Paxlovid being that he was just treated less than 4 weeks ago? Please advise. Thanks!

## 2021-02-28 ENCOUNTER — Encounter: Payer: Self-pay | Admitting: Family Medicine

## 2021-03-06 ENCOUNTER — Encounter: Payer: 59 | Admitting: Physician Assistant

## 2021-03-24 ENCOUNTER — Encounter: Payer: 59 | Admitting: Family Medicine

## 2021-03-27 ENCOUNTER — Encounter: Payer: Self-pay | Admitting: Family Medicine

## 2021-03-27 ENCOUNTER — Ambulatory Visit (INDEPENDENT_AMBULATORY_CARE_PROVIDER_SITE_OTHER): Payer: 59 | Admitting: Family Medicine

## 2021-03-27 ENCOUNTER — Telehealth: Payer: Self-pay

## 2021-03-27 ENCOUNTER — Other Ambulatory Visit: Payer: Self-pay

## 2021-03-27 VITALS — BP 138/85 | HR 73 | Temp 98.4°F | Resp 18 | Ht 68.0 in | Wt 160.0 lb

## 2021-03-27 DIAGNOSIS — Z0000 Encounter for general adult medical examination without abnormal findings: Secondary | ICD-10-CM | POA: Diagnosis not present

## 2021-03-27 DIAGNOSIS — Z125 Encounter for screening for malignant neoplasm of prostate: Secondary | ICD-10-CM

## 2021-03-27 DIAGNOSIS — I10 Essential (primary) hypertension: Secondary | ICD-10-CM

## 2021-03-27 DIAGNOSIS — R7303 Prediabetes: Secondary | ICD-10-CM | POA: Diagnosis not present

## 2021-03-27 NOTE — Assessment & Plan Note (Signed)
Recommend low carb diet °Recheck A1c  °

## 2021-03-27 NOTE — Telephone Encounter (Signed)
Noted  

## 2021-03-27 NOTE — Telephone Encounter (Signed)
Copied from [place] [id number]. Topic: General - Other >> Mar 27, 2021  2:37 PM [name] wrote: Reason for CRM: Patient wife called in to inform Dr B that patient will need memory and cognitive testing done need to establish [name] baseline. Also state that patient did eat today so cant have blood work he will have to come in one morning to do so.  any questions please call Manuela Schwartz at Ph# [phone]

## 2021-03-27 NOTE — Progress Notes (Signed)
Complete physical exam   Patient: [name]   DOB: 1961/07/02   60 y.o. Male  MRN: [id number] Visit Date: 03/27/2021  Today's healthcare provider: Lavon Paganini, MD   Chief Complaint  Patient presents with   Annual Exam   Subjective    [name] is a 60 y.o. male who presents today for a complete physical exam.  He reports consuming a general diet. The patient has a physically strenuous job, but has no regular exercise apart from work.  He generally feels fairly well. He reports sleeping fairly well. He does have additional problems to discuss today.   HPI   Memory impairment He reports that he had memory impairment and he believes it was associated with the increase in alcohol intake. So he made the change of limiting his beer intake to 1x a month.   Blood pressure  He has an elevated BP today and he denies taking his amlodipine. At home he averages >140/85 and has incorporated some live style changes.  Tick Bites  He gets frequent tick bites. He reports when he gets bit the area become red and painful. He is concerned about lyme disease and whether it is possible to transmit to her wife and daughter.   Vaccines  He is current on his shingrix and COVID vaccines.  Screening Colonoscopy-03/01/2014  BP Readings from Last 3 Encounters:  03/27/21 138/85  11/01/20 (!) 160/89  02/18/20 (!) 142/102     History reviewed. No pertinent past medical history. Past Surgical History:  Procedure Laterality Date   LEG SURGERY     Social History   Socioeconomic History   Marital status: Married    Spouse name: Not on file   Number of children: Not on file   Years of education: Not on file   Highest education level: Not on file  Occupational History   Not on file  Tobacco Use   Smoking status: Never   Smokeless tobacco: Never  Substance and Sexual Activity   Alcohol use: Yes    Alcohol/week: 3.0 standard drinks    Types: 3 Cans of beer per week    Drug use: No   Sexual activity: Not on file  Other Topics Concern   Not on file  Social History Narrative   Not on file   Social Determinants of Health   Financial Resource Strain: Not on file  Food Insecurity: Not on file  Transportation Needs: Not on file  Physical Activity: Not on file  Stress: Not on file  Social Connections: Not on file  Intimate Partner Violence: Not on file   Family Status  Relation Name Status   Mother  18, age 89y   Father  Deceased at age 72   Sister  27   Sister  Alive   Family History  Problem Relation Age of Onset   Breast cancer Mother    Thyroid cancer Mother    Lymphoma Father    CVA Father    Heart attack Sister    Hypertension Sister    No Known Allergies  Patient Care Team: Virginia Crews, MD as PCP - General (Family Medicine)   Medications: Outpatient Medications Prior to Visit  Medication Sig   rizatriptan (MAXALT) 10 MG tablet Take 1 tablet (10 mg total) by mouth as needed for migraine. May repeat in 2 hours if needed   [DISCONTINUED] aspirin EC 81 MG tablet Take by mouth.   [DISCONTINUED] ibuprofen (ADVIL) 600 MG tablet  Take 1 tablet (600 mg total) by mouth every 8 (eight) hours as needed.   [DISCONTINUED] amLODipine (NORVASC) 5 MG tablet Take 1 tablet (5 mg total) by mouth daily. (Patient not taking: Reported on 03/27/2021)   [DISCONTINUED] benzonatate (TESSALON PERLES) 100 MG capsule Take 1 capsule (100 mg total) by mouth 3 (three) times daily as needed. (Patient not taking: Reported on 03/27/2021)   [DISCONTINUED] fluticasone (FLONASE) 50 MCG/ACT nasal spray Place 2 sprays into both nostrils daily. (Patient not taking: Reported on 03/27/2021)   No facility-administered medications prior to visit.    Review of Systems  Constitutional:  Negative for appetite change, chills, fatigue and fever.  HENT:  Negative for congestion, ear pain, hearing loss, nosebleeds, sinus pressure, sinus pain, sore throat and trouble  swallowing.   Eyes:  Positive for pain. Negative for visual disturbance.  Respiratory:  Negative for cough, chest tightness, shortness of breath and wheezing.   Cardiovascular:  Negative for chest pain, palpitations and leg swelling.  Gastrointestinal:  Negative for abdominal pain, blood in stool, constipation, diarrhea, nausea and vomiting.  Endocrine: Negative for polydipsia, polyphagia and polyuria.  Genitourinary:  Negative for dysuria, flank pain, frequency and urgency.  Musculoskeletal:  Negative for arthralgias, back pain, joint swelling, myalgias and neck stiffness.  Skin:  Negative for color change, rash and wound.  Neurological:  Positive for dizziness. Negative for tremors, seizures, syncope, speech difficulty, weakness, light-headedness, numbness and headaches.  Psychiatric/Behavioral:  Negative for behavioral problems, confusion, decreased concentration, dysphoric mood and sleep disturbance. The patient is not nervous/anxious.   All other systems reviewed and are negative.    Objective    BP 138/85 Comment: home reading  Pulse 73   Temp 98.4 F (36.9 C) (Temporal)   Resp 18   Ht '5\' 8"'$  (1.727 m)   Wt 160 lb (72.6 kg)   BMI 24.33 kg/m    Physical Exam Vitals reviewed.  Constitutional:      General: He is not in acute distress.    Appearance: Normal appearance. He is well-developed. He is not diaphoretic.  HENT:     Head: Normocephalic and atraumatic.     Right Ear: Tympanic membrane, ear canal and external ear normal.     Left Ear: Tympanic membrane, ear canal and external ear normal.     Nose: Nose normal.     Mouth/Throat:     Mouth: Mucous membranes are moist.     Pharynx: Oropharynx is clear. No oropharyngeal exudate.  Eyes:     General: No scleral icterus.    Conjunctiva/sclera: Conjunctivae normal.     Pupils: Pupils are equal, round, and reactive to light.  Neck:     Thyroid: No thyromegaly.  Cardiovascular:     Rate and Rhythm: Normal rate and regular  rhythm.     Pulses: Normal pulses.     Heart sounds: Normal heart sounds. No murmur heard. Pulmonary:     Effort: Pulmonary effort is normal. No respiratory distress.     Breath sounds: Normal breath sounds. No wheezing or rales.  Abdominal:     General: There is no distension.     Palpations: Abdomen is soft.     Tenderness: There is no abdominal tenderness.  Musculoskeletal:        General: No deformity.     Cervical back: Neck supple.     Right lower leg: No edema.     Left lower leg: No edema.  Lymphadenopathy:     Cervical: No cervical adenopathy.  Skin:    General: Skin is warm and dry.     Findings: No rash.  Neurological:     Mental Status: He is alert and oriented to person, place, and time. Mental status is at baseline.     Sensory: No sensory deficit.     Motor: No weakness.     Gait: Gait normal.  Psychiatric:        Mood and Affect: Mood normal.        Behavior: Behavior normal.        Thought Content: Thought content normal.     Last depression screening scores PHQ 2/9 Scores 03/27/2021 11/01/2020 02/18/2020  PHQ - 2 Score 0 0 0  PHQ- 9 Score 0 3 2   Last fall risk screening Fall Risk  03/27/2021  Falls in the past year? 1  Number falls in past yr: 0  Injury with Fall? 1  Risk for fall due to : History of fall(s)  Risk for fall due to: Comment -  Follow up Falls prevention discussed   Last Audit-C alcohol use screening Alcohol Use Disorder Test (AUDIT) 03/27/2021  1. How often do you have a drink containing alcohol? 1  2. How many drinks containing alcohol do you have on a typical day when you are drinking? 0  3. How often do you have six or more drinks on one occasion? 0  AUDIT-C Score 1  Alcohol Brief Interventions/Follow-up -   A score of 3 or more in women, and 4 or more in men indicates increased risk for alcohol abuse, EXCEPT if all of the points are from question 1   No results found for any visits on 03/27/21.  Assessment & Plan     Problem List  Items Addressed This Visit       Cardiovascular and Mediastinum   Essential hypertension    Well controlled on home readings Continue lifestyle modifications Recheck metabolic panel       Relevant Orders   Lipid panel   Comprehensive metabolic panel     Other   Prediabetes    Recommend low carb diet Recheck A1c        Relevant Orders   Hemoglobin A1c   Other Visit Diagnoses     Encounter for annual physical exam    -  Primary   Relevant Orders   PSA Total (Reflex To Free)   Lipid panel   Comprehensive metabolic panel   Hemoglobin A1c   Screening for prostate cancer       Relevant Orders   PSA Total (Reflex To Free)       Routine Health Maintenance and Physical Exam  Exercise Activities and Dietary recommendations  Goals   None     Immunization History  Administered Date(s) Administered   Td 06/12/2017   Tdap 05/26/2007   Zoster Recombinat (Shingrix) 05/04/2019, 07/14/2019    Health Maintenance  Topic Date Due   INFLUENZA VACCINE  03/20/2021   COLONOSCOPY (Pts 45-54yr Insurance coverage will need to be confirmed)  03/01/2024   TETANUS/TDAP  06/13/2027   Hepatitis C Screening  Completed   HIV Screening  Completed   Zoster Vaccines- Shingrix  Completed   Pneumococcal Vaccine [id number] old  Aged Out   HPV VACCINES  Aged Out    Discussed health benefits of physical activity, and encouraged him to engage in regular exercise appropriate for his age and condition.   Return in about 1 year (around 03/27/2022) for CPE.  I,Essence Turner,acting as a Education administrator for Lavon Paganini, MD.,have documented all relevant documentation on the behalf of Lavon Paganini, MD,as directed by  Lavon Paganini, MD while in the presence of Lavon Paganini, MD.  I, Lavon Paganini, MD, have reviewed all documentation for this visit. The documentation on 03/27/21 for the exam, diagnosis, procedures, and orders are all accurate and complete.   Patrici Minnis,  Dionne Bucy, MD, MPH [facility]

## 2021-03-27 NOTE — Assessment & Plan Note (Signed)
Well controlled on home readings Continue lifestyle modifications Recheck metabolic panel

## 2021-04-03 ENCOUNTER — Telehealth: Payer: Self-pay

## 2021-04-03 DIAGNOSIS — R413 Other amnesia: Secondary | ICD-10-CM

## 2021-04-03 NOTE — Telephone Encounter (Signed)
Patient states that his wife wants him to be referred for cognitive and memory function testing.  He states that he did the questionnaire here but wants to have more extensive testing done to be able to compare to years from now.  Please advise.

## 2021-04-03 NOTE — Telephone Encounter (Signed)
Ok to place neuropsych referral

## 2021-04-04 ENCOUNTER — Telehealth: Payer: Self-pay

## 2021-04-04 DIAGNOSIS — R413 Other amnesia: Secondary | ICD-10-CM

## 2021-04-04 LAB — COMPREHENSIVE METABOLIC PANEL
ALT: 31 IU/L (ref 0–44)
AST: 31 IU/L (ref 0–40)
Albumin/Globulin Ratio: 1.9 (ref 1.2–2.2)
Albumin: 4.4 g/dL (ref 3.8–4.9)
Alkaline Phosphatase: 79 IU/L (ref 44–121)
BUN/Creatinine Ratio: 19 (ref 10–24)
BUN: 13 mg/dL (ref 8–27)
Bilirubin Total: 1 mg/dL (ref 0.0–1.2)
CO2: 22 mmol/L (ref 20–29)
Calcium: 8.8 mg/dL (ref 8.6–10.2)
Chloride: 102 mmol/L (ref 96–106)
Creatinine, Ser: 0.67 mg/dL — ABNORMAL LOW (ref 0.76–1.27)
Globulin, Total: 2.3 g/dL (ref 1.5–4.5)
Glucose: 100 mg/dL — ABNORMAL HIGH (ref 65–99)
Potassium: 4.6 mmol/L (ref 3.5–5.2)
Sodium: 141 mmol/L (ref 134–144)
Total Protein: 6.7 g/dL (ref 6.0–8.5)
eGFR: 107 mL/min/{1.73_m2} (ref >59)

## 2021-04-04 LAB — LIPID PANEL
Chol/HDL Ratio: 2.8 ratio (ref 0.0–5.0)
Cholesterol, Total: 198 mg/dL (ref 100–199)
HDL: 71 mg/dL (ref >39)
LDL Chol Calc (NIH): 104 mg/dL — ABNORMAL HIGH (ref 0–99)
Triglycerides: 134 mg/dL (ref 0–149)
VLDL Cholesterol Cal: 23 mg/dL (ref 5–40)

## 2021-04-04 LAB — HEMOGLOBIN A1C
Est. average glucose Bld gHb Est-mCnc: 117 mg/dL
Hgb A1c MFr Bld: 5.7 % — ABNORMAL HIGH (ref 4.8–5.6)

## 2021-04-04 LAB — PSA TOTAL (REFLEX TO FREE): Prostate Specific Ag, Serum: 0.9 ng/mL (ref 0.0–4.0)

## 2021-04-04 NOTE — Progress Notes (Signed)
Was told when he came in that these were not indicated at this time

## 2021-04-04 NOTE — Telephone Encounter (Signed)
Mrs. [name] called back requesting to add to list of request for [name]. She is requesting CBC/Diff and full panel [place] spotted fever due to tick bites. And reports that multiple family members have had [facility] spotted fever. She is also requesting more extensive cognitive testing and would like to be referred to Sr. [name] at [facility]. Please advise.

## 2021-04-05 NOTE — Telephone Encounter (Signed)
We have we have discussed and it is listed in the chart multiple times that the additional requested labs are not clinically relevant/appropriate at this time time.  I will not be ordering them.  We referred him to neuropsychology for cognitive testing, but if they are insistent on changing this to neurology then that is fine.

## 2021-05-22 ENCOUNTER — Ambulatory Visit: Payer: 59 | Admitting: Nurse Practitioner

## 2021-05-22 ENCOUNTER — Telehealth: Payer: Self-pay

## 2021-05-22 NOTE — Telephone Encounter (Signed)
Copied from [place] [id number]. Topic: General - Other >> May 22, 2021  2:12 PM Loma Boston wrote: Reason for CRM: Ronalee Belts has called at 2:15 10/3  stating that his wife is coming by to pu a form, states has had an issue before with someone stating  wife not on HIPPA. I verified she is on DPR and he stated he has checked also been told there was no problem and then something comes up. I did tell him she was on file and she is picking up a form for him today.

## 2021-06-19 ENCOUNTER — Ambulatory Visit (INDEPENDENT_AMBULATORY_CARE_PROVIDER_SITE_OTHER): Payer: 59 | Admitting: Nurse Practitioner

## 2021-06-19 ENCOUNTER — Encounter: Payer: Self-pay | Admitting: Nurse Practitioner

## 2021-06-19 ENCOUNTER — Other Ambulatory Visit: Payer: Self-pay

## 2021-06-19 VITALS — BP 152/92 | HR 79 | Ht 66.0 in | Wt 163.4 lb

## 2021-06-19 DIAGNOSIS — R7303 Prediabetes: Secondary | ICD-10-CM | POA: Diagnosis not present

## 2021-06-19 DIAGNOSIS — I10 Essential (primary) hypertension: Secondary | ICD-10-CM

## 2021-06-19 DIAGNOSIS — Z7689 Persons encountering health services in other specified circumstances: Secondary | ICD-10-CM | POA: Diagnosis not present

## 2021-06-19 DIAGNOSIS — G43109 Migraine with aura, not intractable, without status migrainosus: Secondary | ICD-10-CM

## 2021-06-19 NOTE — Assessment & Plan Note (Signed)
Chronic.  Controlled on diet and exercise.  Labs ordered today.  Return to clinic in 3 months for reevaluation.  Call sooner if concerns arise.

## 2021-06-19 NOTE — Assessment & Plan Note (Signed)
Chronic. Uncontrolled.  Patient is aware that blood pressure is elevated.  He declines taking medication for blood pressure. He would like to work on diet and exercise to bring blood pressure down.  Patient understands that he is increased risk of stroke and heart attack with uncontrolled blood pressure.

## 2021-06-19 NOTE — Progress Notes (Signed)
BP (!) 152/92   Pulse 79   Ht $R'5\' 6"'Do$  (1.676 m)   Wt 163 lb 6.4 oz (74.1 kg)   SpO2 99%   BMI 26.37 kg/m    Subjective:    Patient ID: [name], male    DOB: 11/02/1960, 60 y.o.   MRN: [id number]  HPI: [name] is a 60 y.o. male  Chief Complaint  Patient presents with   Establish Care   Patient presents to clinic to establish care with new PCP.  Patient reports a history of Hypertension and Ocular Migraine, patient has had multiple concussions and multiple concussions.  Patient states he has taken the Maxalt with his migraine.  This improves the dizziness that he gets from the migraines.  Patient states his biggest concern at this point is that he has trouble with his eyes drooping while driving and staring at one thing.  He also has this problem when he is having a conversation.  He has spoken with his ophthalmologist who has managed this concern for him.   Denies HA, CP, SOB, palpitations, visual changes, and lower extremity swelling.   Patient denies a history of: Elevated Cholesterol, Diabetes, Thyroid problems, Depression, Anxiety, and Abdominal problems.   Active Ambulatory Problems    Diagnosis Date Noted   Arthralgia of multiple joints 04/28/2009   Migraine with vertigo 01/05/2014   Carpal tunnel syndrome 08/30/2008   Essential hypertension 08/26/2018   History of nausea- reported 01/27/2020 02/02/2020   Accidental fall from ladder 02/02/2020   Acute right-sided thoracic back pain 02/02/2020   Neck stiffness 02/02/2020   Concussion with no loss of consciousness 02/02/2020   Multiple falls 02/02/2020   Head trauma, initial encounter 02/02/2020   Acute post-traumatic headache, not intractable 02/02/2020   Pre-bariatric surgery nutrition evaluation 06/19/2018   Prediabetes 03/27/2021   Resolved Ambulatory Problems    Diagnosis Date Noted   Blood pressure elevated without history of HTN 04/04/2007   Past Medical History:  Diagnosis Date    Arthritis    Past Surgical History:  Procedure Laterality Date   LEG SURGERY     Family History  Problem Relation Age of Onset   Breast cancer Mother    Thyroid cancer Mother    Lymphoma Father    CVA Father    Heart attack Sister    Hypertension Sister       Review of Systems  Eyes:  Negative for visual disturbance.  Respiratory:  Negative for shortness of breath.   Cardiovascular:  Negative for chest pain and leg swelling.  Neurological:  Positive for dizziness. Negative for light-headedness and headaches.   Per HPI unless specifically indicated above     Objective:    BP (!) 152/92   Pulse 79   Ht $R'5\' 6"'pq$  (1.676 m)   Wt 163 lb 6.4 oz (74.1 kg)   SpO2 99%   BMI 26.37 kg/m   Wt Readings from Last 3 Encounters:  06/19/21 163 lb 6.4 oz (74.1 kg)  03/27/21 160 lb (72.6 kg)  11/01/20 164 lb (74.4 kg)    Physical Exam Vitals and nursing note reviewed.  Constitutional:      General: He is not in acute distress.    Appearance: Normal appearance. He is not ill-appearing, toxic-appearing or diaphoretic.  HENT:     Head: Normocephalic.     Right Ear: External ear normal.     Left Ear: External ear normal.     Nose: Nose normal. No congestion or  rhinorrhea.     Mouth/Throat:     Mouth: Mucous membranes are moist.  Eyes:     General:        Right eye: No discharge.        Left eye: No discharge.     Extraocular Movements: Extraocular movements intact.     Conjunctiva/sclera: Conjunctivae normal.     Pupils: Pupils are equal, round, and reactive to light.  Cardiovascular:     Rate and Rhythm: Normal rate and regular rhythm.     Heart sounds: No murmur heard. Pulmonary:     Effort: Pulmonary effort is normal. No respiratory distress.     Breath sounds: Normal breath sounds. No wheezing, rhonchi or rales.  Abdominal:     General: Abdomen is flat. Bowel sounds are normal.  Musculoskeletal:     Cervical back: Normal range of motion and neck supple.  Skin:     General: Skin is warm and dry.     Capillary Refill: Capillary refill takes less than 2 seconds.  Neurological:     General: No focal deficit present.     Mental Status: He is alert and oriented to person, place, and time.  Psychiatric:        Mood and Affect: Mood normal.        Behavior: Behavior normal.        Thought Content: Thought content normal.        Judgment: Judgment normal.    Results for orders placed or performed in visit on 03/27/21  PSA Total (Reflex To Free)  Result Value Ref Range   Prostate Specific Ag, Serum 0.9 0.0 - 4.0 ng/mL   Reflex Criteria Comment   Lipid panel  Result Value Ref Range   Cholesterol, Total 198 100 - 199 mg/dL   Triglycerides 134 0 - 149 mg/dL   HDL 71 >39 mg/dL   VLDL Cholesterol Cal 23 5 - 40 mg/dL   LDL Chol Calc (NIH) 104 (H) 0 - 99 mg/dL   Chol/HDL Ratio 2.8 0.0 - 5.0 ratio  Comprehensive metabolic panel  Result Value Ref Range   Glucose 100 (H) 65 - 99 mg/dL   BUN 13 8 - 27 mg/dL   Creatinine, Ser 0.67 (L) 0.76 - 1.27 mg/dL   eGFR 107 >59 mL/min/1.73   BUN/Creatinine Ratio 19 10 - 24   Sodium 141 134 - 144 mmol/L   Potassium 4.6 3.5 - 5.2 mmol/L   Chloride 102 96 - 106 mmol/L   CO2 22 20 - 29 mmol/L   Calcium 8.8 8.6 - 10.2 mg/dL   Total Protein 6.7 6.0 - 8.5 g/dL   Albumin 4.4 3.8 - 4.9 g/dL   Globulin, Total 2.3 1.5 - 4.5 g/dL   Albumin/Globulin Ratio 1.9 1.2 - 2.2   Bilirubin Total 1.0 0.0 - 1.2 mg/dL   Alkaline Phosphatase 79 44 - 121 IU/L   AST 31 0 - 40 IU/L   ALT 31 0 - 44 IU/L  Hemoglobin A1c  Result Value Ref Range   Hgb A1c MFr Bld 5.7 (H) 4.8 - 5.6 %   Est. average glucose Bld gHb Est-mCnc 117 mg/dL      Assessment & Plan:   Problem List Items Addressed This Visit       Cardiovascular and Mediastinum   Essential hypertension - Primary    Chronic. Uncontrolled.  Patient is aware that blood pressure is elevated.  He declines taking medication for blood pressure. He would like to work on  diet and  exercise to bring blood pressure down.  Patient understands that he is increased risk of stroke and heart attack with uncontrolled blood pressure.         Other   Migraine with vertigo    Controlled with Maxalt. Has seen Neurology and Ophthalmology for symptoms. Continue to follow per their recommendations.      Prediabetes    Chronic.  Controlled on diet and exercise.  Labs ordered today.  Return to clinic in 3 months for reevaluation.  Call sooner if concerns arise.        Other Visit Diagnoses     Encounter to establish care       Return in 3 months for reevaluation.        Follow up plan: Return in about 3 months (around 09/19/2021) for BP Check.   A total of 30 minutes were spent on this encounter today.  When total time is documented, this includes both the face-to-face and non-face-to-face time personally spent before, during and after the visit on the date of the encounter discussing risks of uncontrolled blood pressure, health maintenance and concerns with his eye drooping.

## 2021-06-19 NOTE — Assessment & Plan Note (Signed)
Controlled with Maxalt. Has seen Neurology and Ophthalmology for symptoms. Continue to follow per their recommendations.

## 2021-07-04 ENCOUNTER — Other Ambulatory Visit: Payer: Self-pay | Admitting: Neurology

## 2021-07-04 DIAGNOSIS — G43109 Migraine with aura, not intractable, without status migrainosus: Secondary | ICD-10-CM

## 2021-07-07 ENCOUNTER — Ambulatory Visit: Admission: RE | Admit: 2021-07-07 | Payer: 59 | Source: Ambulatory Visit

## 2021-07-09 ENCOUNTER — Ambulatory Visit
Admission: RE | Admit: 2021-07-09 | Discharge: 2021-07-09 | Disposition: A | Discharge status: Home / Self Care | Payer: 59 | Source: Ambulatory Visit | Attending: Neurology | Admitting: Neurology

## 2021-07-09 ENCOUNTER — Other Ambulatory Visit: Payer: Self-pay

## 2021-07-09 DIAGNOSIS — G43109 Migraine with aura, not intractable, without status migrainosus: Secondary | ICD-10-CM | POA: Diagnosis not present

## 2021-07-09 MED ORDER — GADOBUTROL 1 MMOL/ML IV SOLN
7.0000 mL | Freq: Once | INTRAVENOUS | Status: AC | PRN
  Administered 2021-07-09: 7.5 mL via INTRAVENOUS

## 2021-07-31 ENCOUNTER — Other Ambulatory Visit: Payer: Self-pay | Admitting: Family Medicine

## 2021-07-31 DIAGNOSIS — G43809 Other migraine, not intractable, without status migrainosus: Secondary | ICD-10-CM

## 2021-07-31 NOTE — Telephone Encounter (Signed)
Medication Refill - Medication: rizatriptan (MAXALT) 10 MG tablet  Has the patient contacted their pharmacy? Yes.   (Agent: If no, request that the patient contact the pharmacy for the refill. If patient does not wish to contact the pharmacy document the reason why and proceed with request.) (Agent: If yes, when and what did the pharmacy advise?)  Preferred Pharmacy (with phone number or street name):  [place], [address]  [place] [address]  Phone: [phone] Fax: [phone]   Has the patient been seen for an appointment in the last year OR does the patient have an upcoming appointment? Yes.    Agent: Please be advised that RX refills may take up to 3 business days. We ask that you follow-up with your pharmacy.

## 2021-07-31 NOTE — Telephone Encounter (Signed)
Requested medications are due for refill today.  yes  Requested medications are on the active medications list.  yes  Last refill.12/12/2020  Future visit scheduled.   Yes  Notes to clinic.  Unsure if pt is see Jon Billings att [facility] or Dr. Brita Romp at [place].    Requested Prescriptions  Pending Prescriptions Disp Refills   rizatriptan (MAXALT) 10 MG tablet 10 tablet 0    Sig: Take 1 tablet (10 mg total) by mouth as needed for migraine. May repeat in 2 hours if needed     Neurology:  Migraine Therapy - Triptan Failed - 07/31/2021 12:38 PM      Failed - Last BP in normal range    BP Readings from Last 1 Encounters:  06/19/21 (!) 152/92          Passed - Valid encounter within last 12 months    Recent Outpatient Visits           1 month ago Essential hypertension   [facility], Karen, NP   4 months ago Encounter for annual physical exam   [facility], Dionne Bucy, MD   9 months ago Lumbar radiculopathy   [facility] Carles Collet M, [place]   9 months ago No-show for appointment   [facility] Trinna Post, [place]   1 year ago Annual physical exam   [facility] Trinna Post, [place]       Future Appointments             In 1 month Jon Billings, NP [facility], [place]

## 2021-08-02 MED ORDER — RIZATRIPTAN BENZOATE 10 MG PO TABS: 10.0000 mg | ORAL_TABLET | ORAL | 0 refills | Status: AC | PRN

## 2021-08-02 NOTE — Telephone Encounter (Signed)
Wife for patient called in about refill. I show denied because not under prescribers care but  Patient has an appt with Dr Mathis Dad in February 1

## 2021-08-02 NOTE — Addendum Note (Signed)
Addended by: Georgina Peer on: 08/02/2021 01:18 PM   Modules accepted: Orders

## 2021-08-02 NOTE — Telephone Encounter (Signed)
Patient established care here 06/19/21 and has appointment 09/20/21

## 2021-08-02 NOTE — Telephone Encounter (Signed)
Pt's wife called, spoke with her about refill on Rizatriptan. She states that pt is [facility] pt and sees Santiago Glad, NP. Refill request needs to be filled by [facility]. Will send them the refill now. Wife verbalized understanding.

## 2021-08-03 NOTE — Telephone Encounter (Signed)
Patients wife notified

## 2021-08-25 ENCOUNTER — Encounter: Payer: 59 | Admitting: Family Medicine

## 2021-09-05 DIAGNOSIS — M65321 Trigger finger, right index finger: Secondary | ICD-10-CM | POA: Diagnosis not present

## 2021-09-20 ENCOUNTER — Ambulatory Visit: Payer: 59 | Admitting: Nurse Practitioner

## 2021-09-22 DIAGNOSIS — M4317 Spondylolisthesis, lumbosacral region: Secondary | ICD-10-CM | POA: Diagnosis not present

## 2021-09-22 DIAGNOSIS — M4807 Spinal stenosis, lumbosacral region: Secondary | ICD-10-CM | POA: Diagnosis not present

## 2021-09-22 DIAGNOSIS — M48061 Spinal stenosis, lumbar region without neurogenic claudication: Secondary | ICD-10-CM | POA: Diagnosis not present

## 2021-10-04 DIAGNOSIS — M65321 Trigger finger, right index finger: Secondary | ICD-10-CM | POA: Diagnosis not present

## 2021-10-09 DIAGNOSIS — M5459 Other low back pain: Secondary | ICD-10-CM | POA: Diagnosis not present

## 2021-10-09 DIAGNOSIS — M5416 Radiculopathy, lumbar region: Secondary | ICD-10-CM | POA: Diagnosis not present

## 2021-10-10 ENCOUNTER — Ambulatory Visit: Payer: Self-pay | Admitting: *Deleted

## 2021-10-10 NOTE — Telephone Encounter (Signed)
° °  Chief Complaint: Speech "Processing" difficulties Symptoms: "Wife noted gradual worsening of my speech and processing over the last 2 years." Frequency: 2 years S/P fall. Pertinent Negatives: Patient denies  Disposition: [] ED /[] Urgent Care (no appt availability in office) / [] Appointment(In office/virtual)/ []  [facility]/ [] Home Care/ [] Refused Recommended Disposition /[] [place] [facility]/ [x]  Follow-up with PCP Additional Notes: Pt states fell 2 years ago, has had vestibular issues and occular migraines since. Reports also had speech and processing difficulties for years but getting worse past year. Advised to alert neurologist. Assured NT would also alert PCP for review. Pt requesting referral to "Someone who deals with ADHD or autism." States will speak to neurologist.Please advise   Reason for Disposition  [1] Loss of speech or garbled speech AND [2] is a chronic symptom (recurrent or ongoing AND present > 4 weeks)  Answer Assessment - Initial Assessment Questions 1. SYMPTOM: "What is the main symptom you are concerned about?" (e.g., weakness, numbness)     Speech difficulties, incoming "Processing" 2. ONSET: "When did this start?" (minutes, hours, days; while sleeping)     2 years after fall, worsening 3. LAST NORMAL: "When was the last time you (the patient) were normal (no symptoms)?"     2  years ago 4. PATTERN "Does this come and go, or has it been constant since it started?"  "Is it present now?"     Comes and goes. 5. CARDIAC SYMPTOMS: "Have you had any of the following symptoms: chest pain, difficulty breathing, palpitations?"      6. NEUROLOGIC SYMPTOMS: "Have you had any of the following symptoms: headache, dizziness, vision loss, double vision, changes in speech, unsteady on your feet?"     Ongoing 7. OTHER SYMPTOMS: "Do you have any other symptoms?"     None new  Protocols used: Neurologic Deficit-A-AH

## 2021-10-10 NOTE — Telephone Encounter (Signed)
Pt scheduled for an appt Thursday

## 2021-10-10 NOTE — Telephone Encounter (Signed)
I recommend patient have an appointment to discuss concerns.

## 2021-10-12 ENCOUNTER — Encounter: Payer: Self-pay | Admitting: Nurse Practitioner

## 2021-10-12 ENCOUNTER — Other Ambulatory Visit: Payer: Self-pay

## 2021-10-12 ENCOUNTER — Ambulatory Visit (INDEPENDENT_AMBULATORY_CARE_PROVIDER_SITE_OTHER): Payer: 59 | Admitting: Nurse Practitioner

## 2021-10-12 VITALS — BP 143/88 | HR 79 | Temp 98.5°F | Wt 161.4 lb

## 2021-10-12 DIAGNOSIS — E538 Deficiency of other specified B group vitamins: Secondary | ICD-10-CM | POA: Diagnosis not present

## 2021-10-12 DIAGNOSIS — R5383 Other fatigue: Secondary | ICD-10-CM | POA: Diagnosis not present

## 2021-10-12 DIAGNOSIS — G319 Degenerative disease of nervous system, unspecified: Secondary | ICD-10-CM | POA: Diagnosis not present

## 2021-10-12 DIAGNOSIS — W57XXXA Bitten or stung by nonvenomous insect and other nonvenomous arthropods, initial encounter: Secondary | ICD-10-CM | POA: Diagnosis not present

## 2021-10-12 DIAGNOSIS — R41840 Attention and concentration deficit: Secondary | ICD-10-CM | POA: Diagnosis not present

## 2021-10-12 NOTE — Progress Notes (Signed)
BP (!) 143/88 (BP Location: Right Arm, Cuff Size: Normal)    Pulse 79    Temp 98.5 F (36.9 C) (Oral)    Wt 161 lb 6.4 oz (73.2 kg)    SpO2 98%    BMI 26.05 kg/m    Subjective:    Patient ID: [name], male    DOB: 12/20/1960, 61 y.o.   MRN: [id number]  HPI: [name] is a 60 y.o. male  Chief Complaint  Patient presents with   Concentration    Pt states he has been having issues with concentration    Insect Bite    Pt states he has been bitten by a tick within the last 6 months, would like to be tested for RMSF   Migraine    Pt states he has had occular and vestibular migraines in the past. States he doesn't have these much anymore. States that his wife has told him that his ability to communicate has decreased over the last 3 years.     Patient states his daughter has a diagnosis of RMSF and is taking antibiotics for that.  His wife would like him tested for RMSF.  He had a tick bite in the last 6 months. Denies any symptoms.  Patient states his ability to concentrate has decreased in the last 2-3 years.  He fell off a later around that time and since then he hasn't been able to concentrate as well.  States he notices his eyes flutter and then will shut as people are talking to him.  Long term staring makes him sleepy.  Darting his eyes around keeps him from falling asleep.  In the last 3 years, his ability to understand and communicate others has decreased.    Patient states he used to have ocular migraines and then they changed to Migraines with vertigo.     Relevant past medical, surgical, family and social history reviewed and updated as indicated. Interim medical history since our last visit reviewed. Allergies and medications reviewed and updated.  Review of Systems  Constitutional:  Positive for fatigue.  Psychiatric/Behavioral:  Positive for decreased concentration.    Per HPI unless specifically indicated above     Objective:    BP (!) 143/88 (BP  Location: Right Arm, Cuff Size: Normal)    Pulse 79    Temp 98.5 F (36.9 C) (Oral)    Wt 161 lb 6.4 oz (73.2 kg)    SpO2 98%    BMI 26.05 kg/m   Wt Readings from Last 3 Encounters:  10/12/21 161 lb 6.4 oz (73.2 kg)  06/19/21 163 lb 6.4 oz (74.1 kg)  03/27/21 160 lb (72.6 kg)    Physical Exam Vitals and nursing note reviewed.  Constitutional:      General: He is not in acute distress.    Appearance: Normal appearance. He is not ill-appearing, toxic-appearing or diaphoretic.  HENT:     Head: Normocephalic.     Right Ear: External ear normal.     Left Ear: External ear normal.     Nose: Nose normal. No congestion or rhinorrhea.     Mouth/Throat:     Mouth: Mucous membranes are moist.  Eyes:     General:        Right eye: No discharge.        Left eye: No discharge.     Extraocular Movements: Extraocular movements intact.     Conjunctiva/sclera: Conjunctivae normal.     Pupils: Pupils are  equal, round, and reactive to light.  Cardiovascular:     Rate and Rhythm: Normal rate and regular rhythm.     Heart sounds: No murmur heard. Pulmonary:     Effort: Pulmonary effort is normal. No respiratory distress.     Breath sounds: Normal breath sounds. No wheezing, rhonchi or rales.  Abdominal:     General: Abdomen is flat. Bowel sounds are normal.  Musculoskeletal:     Cervical back: Normal range of motion and neck supple.  Skin:    General: Skin is warm and dry.     Capillary Refill: Capillary refill takes less than 2 seconds.  Neurological:     General: No focal deficit present.     Mental Status: He is alert and oriented to person, place, and time.  Psychiatric:        Mood and Affect: Mood normal.        Behavior: Behavior normal.        Thought Content: Thought content normal.        Judgment: Judgment normal.    Results for orders placed or performed in visit on 03/27/21  PSA Total (Reflex To Free)  Result Value Ref Range   Prostate Specific Ag, Serum 0.9 0.0 - 4.0  ng/mL   Reflex Criteria Comment   Lipid panel  Result Value Ref Range   Cholesterol, Total 198 100 - 199 mg/dL   Triglycerides 134 0 - 149 mg/dL   HDL 71 >39 mg/dL   VLDL Cholesterol Cal 23 5 - 40 mg/dL   LDL Chol Calc (NIH) 104 (H) 0 - 99 mg/dL   Chol/HDL Ratio 2.8 0.0 - 5.0 ratio  Comprehensive metabolic panel  Result Value Ref Range   Glucose 100 (H) 65 - 99 mg/dL   BUN 13 8 - 27 mg/dL   Creatinine, Ser 0.67 (L) 0.76 - 1.27 mg/dL   eGFR 107 >59 mL/min/1.73   BUN/Creatinine Ratio 19 10 - 24   Sodium 141 134 - 144 mmol/L   Potassium 4.6 3.5 - 5.2 mmol/L   Chloride 102 96 - 106 mmol/L   CO2 22 20 - 29 mmol/L   Calcium 8.8 8.6 - 10.2 mg/dL   Total Protein 6.7 6.0 - 8.5 g/dL   Albumin 4.4 3.8 - 4.9 g/dL   Globulin, Total 2.3 1.5 - 4.5 g/dL   Albumin/Globulin Ratio 1.9 1.2 - 2.2   Bilirubin Total 1.0 0.0 - 1.2 mg/dL   Alkaline Phosphatase 79 44 - 121 IU/L   AST 31 0 - 40 IU/L   ALT 31 0 - 44 IU/L  Hemoglobin A1c  Result Value Ref Range   Hgb A1c MFr Bld 5.7 (H) 4.8 - 5.6 %   Est. average glucose Bld gHb Est-mCnc 117 mg/dL      Assessment & Plan:   Problem List Items Addressed This Visit   None Visit Diagnoses     Cerebral atrophy ([place])    -  Primary   Noted on CT in November 2022.  Reviewed with patient during visit today. Discussed brain exercises and continue to follow up with Neurology.   Tick bite, unspecified site, initial encounter       Will check RMSF as requested by patient today.    Relevant Orders   Rocky mtn spotted fvr abs pnl(IgG+IgM)   Difficulty concentrating       Patient does not want to see pyschiatry or have medication management. Would like to see pyschology for talk therapy.  Relevant Orders   Ambulatory referral to Psychology   Vitamin D (25 hydroxy)   B12 deficiency       Found on Labs done by Neurology.  Discussed B12 supplement with patient during visit.    Other fatigue       Relevant Orders   Vitamin D (25 hydroxy)         Follow up plan: No follow-ups on file.

## 2021-10-14 LAB — ROCKY MTN SPOTTED FVR ABS PNL(IGG+IGM)

## 2021-10-16 LAB — ROCKY MTN SPOTTED FVR ABS PNL(IGG+IGM)
RMSF IgG: POSITIVE — AB
RMSF IgM: 0.27 index (ref 0.00–0.89)

## 2021-10-16 LAB — VITAMIN D 25 HYDROXY (VIT D DEFICIENCY, FRACTURES): Vit D, 25-Hydroxy: 23 ng/mL — ABNORMAL LOW (ref 30.0–100.0)

## 2021-10-16 LAB — RMSF, IGG, IFA: RMSF, IGG, IFA: 1:256 {titer} — ABNORMAL HIGH

## 2021-10-17 ENCOUNTER — Encounter: Payer: Self-pay | Admitting: Psychology

## 2021-10-17 ENCOUNTER — Telehealth: Payer: Self-pay | Admitting: Nurse Practitioner

## 2021-10-17 NOTE — Telephone Encounter (Signed)
Copied from [place] [phone]. Topic: General - Other >> Oct 17, 2021 10:27 AM Alanda Slim E wrote: Reason for CRM: Pts rocky [place] spotted fever result was positive / pts wife asked for a call when medication is sent to [place] [facility] so she'll know when to go pick up/ please advise

## 2021-10-17 NOTE — Telephone Encounter (Signed)
Spoke with patient wife Manuela Schwartz and notified her of Karen's recommendations and made aware that [facility] Glad was out of the office this afternoon and will return to office tomorrow. Manuela Schwartz says they will be in town tomorrow for another appointment and they will follow up regarding patient's medication.

## 2021-10-18 DIAGNOSIS — H53121 Transient visual loss, right eye: Secondary | ICD-10-CM | POA: Diagnosis not present

## 2021-10-18 DIAGNOSIS — Z9181 History of falling: Secondary | ICD-10-CM | POA: Diagnosis not present

## 2021-10-18 DIAGNOSIS — G3184 Mild cognitive impairment, so stated: Secondary | ICD-10-CM | POA: Diagnosis not present

## 2021-10-18 DIAGNOSIS — G43109 Migraine with aura, not intractable, without status migrainosus: Secondary | ICD-10-CM | POA: Diagnosis not present

## 2021-10-18 MED ORDER — DOXYCYCLINE HYCLATE 100 MG PO TABS: 100.0000 mg | ORAL_TABLET | Freq: Two times a day (BID) | ORAL | 0 refills | Status: DC

## 2021-10-18 NOTE — Progress Notes (Signed)
Please let patient know that his Vitamin D level is low.  I recommend he start Vitamin D 1000IU daily.  This can be found over the counter.  Additionally, patient's rocky mountain spotted fever labs show that patient had a past infection.  The lab work that would show a present infection is negative.  However, due to patient's symptoms we can treat him with the doxycycline twice daily for 21 days.  I have sent this to the pharmacy.

## 2021-10-18 NOTE — Addendum Note (Signed)
Addended by: Jon Billings on: 10/18/2021 07:55 AM   Modules accepted: Orders

## 2021-11-15 DIAGNOSIS — R29898 Other symptoms and signs involving the musculoskeletal system: Secondary | ICD-10-CM | POA: Diagnosis not present

## 2022-01-18 DIAGNOSIS — M79644 Pain in right finger(s): Secondary | ICD-10-CM | POA: Diagnosis not present

## 2022-01-29 ENCOUNTER — Ambulatory Visit (INDEPENDENT_AMBULATORY_CARE_PROVIDER_SITE_OTHER): Payer: 59 | Admitting: Family Medicine

## 2022-01-29 ENCOUNTER — Telehealth: Payer: Self-pay | Admitting: Nurse Practitioner

## 2022-01-29 ENCOUNTER — Encounter: Payer: Self-pay | Admitting: Family Medicine

## 2022-01-29 ENCOUNTER — Other Ambulatory Visit (HOSPITAL_COMMUNITY)
Admission: RE | Admit: 2022-01-29 | Discharge: 2022-01-29 | Disposition: A | Discharge status: Home / Self Care | Payer: 59 | Source: Ambulatory Visit | Attending: Family Medicine | Admitting: Family Medicine

## 2022-01-29 VITALS — BP 143/82 | HR 73 | Temp 98.1°F | Wt 157.0 lb

## 2022-01-29 DIAGNOSIS — L819 Disorder of pigmentation, unspecified: Secondary | ICD-10-CM | POA: Diagnosis not present

## 2022-01-29 DIAGNOSIS — D492 Neoplasm of unspecified behavior of bone, soft tissue, and skin: Secondary | ICD-10-CM | POA: Diagnosis not present

## 2022-01-29 DIAGNOSIS — R29898 Other symptoms and signs involving the musculoskeletal system: Secondary | ICD-10-CM | POA: Diagnosis not present

## 2022-01-29 DIAGNOSIS — L859 Epidermal thickening, unspecified: Secondary | ICD-10-CM | POA: Diagnosis not present

## 2022-01-29 DIAGNOSIS — H9193 Unspecified hearing loss, bilateral: Secondary | ICD-10-CM

## 2022-01-29 MED ORDER — PREDNISONE 10 MG PO TABS: ORAL_TABLET | ORAL | 0 refills | Status: DC

## 2022-01-29 NOTE — Progress Notes (Signed)
BP (!) 143/82   Pulse 73   Temp 98.1 F (36.7 C)   Wt 157 lb (71.2 kg)   SpO2 98%   BMI 25.34 kg/m    Subjective:    Patient ID: [name], male    DOB: [birth year], 61 y.o.   MRN: [id number]  HPI: [name] is a 61 y.o. male  Chief Complaint  Patient presents with   Skin Concern    Patient states he has a dark spot on his right arm that has been there for about 3 months    Noticed that he has had some weakness in his R calf for about a week. He tried to step up and his calf would not do what he wanted to. It seems weaker and he has been falling more. He has an extensive DJD history with his back and has been seeing ortho and neurology. This is a change from prior. He is not sure what set it off, but he notes that it is not his baseline.  SKIN LESION Duration: months Location: R forearm Painful: no Itching: no Onset: gradual Context: not changing Associated signs and symptoms: none History of skin cancer: no  Relevant past medical, surgical, family and social history reviewed and updated as indicated. Interim medical history since our last visit reviewed. Allergies and medications reviewed and updated.  Review of Systems  Constitutional: Negative.   HENT: Negative.    Respiratory: Negative.    Cardiovascular: Negative.   Gastrointestinal: Negative.   Genitourinary: Negative.   Musculoskeletal:  Positive for back pain and myalgias. Negative for arthralgias, gait problem, joint swelling, neck pain and neck stiffness.  Neurological:  Positive for weakness and numbness. Negative for dizziness, tremors, seizures, syncope, facial asymmetry, speech difficulty, light-headedness and headaches.  Psychiatric/Behavioral: Negative.      Per HPI unless specifically indicated above     Objective:    BP (!) 143/82   Pulse 73   Temp 98.1 F (36.7 C)   Wt 157 lb (71.2 kg)   SpO2 98%   BMI 25.34 kg/m   Wt Readings from Last 3 Encounters:  01/29/22 157 lb  (71.2 kg)  10/12/21 161 lb 6.4 oz (73.2 kg)  06/19/21 163 lb 6.4 oz (74.1 kg)    Physical Exam Vitals and nursing note reviewed.  Constitutional:      General: He is not in acute distress.    Appearance: Normal appearance. He is normal weight. He is not ill-appearing, toxic-appearing or diaphoretic.  HENT:     Head: Normocephalic and atraumatic.     Right Ear: External ear normal.     Left Ear: External ear normal.     Nose: Nose normal.     Mouth/Throat:     Mouth: Mucous membranes are moist.     Pharynx: Oropharynx is clear.  Eyes:     General: No scleral icterus.       Right eye: No discharge.        Left eye: No discharge.     Extraocular Movements: Extraocular movements intact.     Conjunctiva/sclera: Conjunctivae normal.     Pupils: Pupils are equal, round, and reactive to light.  Cardiovascular:     Rate and Rhythm: Normal rate and regular rhythm.     Pulses: Normal pulses.     Heart sounds: Normal heart sounds. No murmur heard.    No friction rub. No gallop.  Pulmonary:     Effort: Pulmonary effort is normal. No  respiratory distress.     Breath sounds: Normal breath sounds. No stridor. No wheezing, rhonchi or rales.  Chest:     Chest wall: No tenderness.  Musculoskeletal:        General: Normal range of motion.     Cervical back: Normal range of motion and neck supple.  Skin:    General: Skin is warm and dry.     Capillary Refill: Capillary refill takes less than 2 seconds.     Coloration: Skin is not jaundiced or pale.     Findings: No bruising, erythema, lesion or rash.     Comments: 1 cm irregular hyperpigmented lesion on R forearm  Neurological:     General: No focal deficit present.     Mental Status: He is alert and oriented to person, place, and time. Mental status is at baseline.  Psychiatric:        Mood and Affect: Mood normal.        Behavior: Behavior normal.        Thought Content: Thought content normal.        Judgment: Judgment normal.      Results for orders placed or performed in visit on 10/12/21  Rocky mtn spotted fvr abs pnl(IgG+IgM)  Result Value Ref Range   RMSF IgG Positive (A) Negative   RMSF IgM 0.27 0.00 - 0.89 index  Vitamin D (25 hydroxy)  Result Value Ref Range   Vit D, 25-Hydroxy 23.0 (L) 30.0 - 100.0 ng/mL  RMSF, IgG, IFA  Result Value Ref Range   RMSF, IGG, IFA 1:256 (H) Neg <1:64      Assessment & Plan:   Problem List Items Addressed This Visit   None Visit Diagnoses     Neoplasm of skin    -  Primary   Removed today as below.    Relevant Orders   Surgical pathology   Weakness of right leg       Given history of DJD, will get x-ray and treat with prednisone. Recheck with PCP in 1 month. Had MRI last year of back- will get results.    Relevant Orders   DG Lumbar Spine Complete       Skin Procedure  Procedure: Informed consent given.  Sterile prep of the area.  Area infiltrated with lidocaine without epinephrine.  Using a surgical blade, part of the upper dermis shaved off and sent  for pathology.  Area cauterized. Pt ed on scarring.  Diagnosis:   ICD-10-CM   1. Neoplasm of skin  D49.2 Surgical pathology   Removed today as below.     2. Weakness of right leg  R29.898 DG Lumbar Spine Complete   Given history of DJD, will get x-ray and treat with prednisone. Recheck with PCP in 1 month. Had MRI last year of back- will get results.       Lesion Location/Size: 1 cm irregular hyperpigmented lesion on R forearm Physician: MJ Consent:  Risks, benefits, and alternative treatments discussed and all questions were answered.  Patient elected to proceed and verbal consent obtained.  Description: Area prepped and draped using semi-sterile technique. Area locally anesthetized using 3 cc's of lidocaine 1% plain. Shave biopsy of lesion performed using a dermablade.  Adequate hemostastis achieved using Silver Nitrate.  Wound dressed after application of bacitracin ointment.  Post Procedure  Instructions:  Wound care instructions discussed and patient was instructed to keep area clean and dry.  Signs and symptoms of infection discussed, patient agrees to contact the  office ASAP should they occur.  Dressing change recommended every other day.  Follow up plan: Return in about 4 weeks (around 02/26/2022) for records release [facility] MRI please.

## 2022-01-29 NOTE — Telephone Encounter (Signed)
Copied from [place] [phone]. Topic: General - Inquiry >> Jan 29, 2022 12:16 PM Erskine Squibb wrote: Reason for CRM: Patients spouse called in requesting a referral to [facility] at the [facility] office only for audiology at this time. She says he just needs a hearing test at this point. The neuropsychologist stated he needed this to be done but that the referral needed to come from his primary care doctor. The fax number for [facility] to send the referral is [phone] and the phone number is [phone]. Please assist patient further.

## 2022-01-29 NOTE — Telephone Encounter (Signed)
Pts wife wanted to know why the referral was sent to [facility], caller wants the referral sent to [facility], please advise.

## 2022-01-30 ENCOUNTER — Encounter: Payer: Self-pay | Admitting: Family Medicine

## 2022-01-31 LAB — SURGICAL PATHOLOGY

## 2022-02-04 ENCOUNTER — Encounter: Payer: Self-pay | Admitting: Family Medicine

## 2022-02-08 ENCOUNTER — Ambulatory Visit: Payer: 59 | Admitting: Psychology

## 2022-02-14 ENCOUNTER — Encounter: Payer: Self-pay | Admitting: Psychology

## 2022-02-14 ENCOUNTER — Encounter: Payer: 59 | Attending: Psychology | Admitting: Psychology

## 2022-02-14 DIAGNOSIS — F840 Autistic disorder: Secondary | ICD-10-CM | POA: Diagnosis not present

## 2022-02-14 DIAGNOSIS — G3184 Mild cognitive impairment, so stated: Secondary | ICD-10-CM

## 2022-02-14 DIAGNOSIS — R413 Other amnesia: Secondary | ICD-10-CM | POA: Diagnosis not present

## 2022-02-14 DIAGNOSIS — R41840 Attention and concentration deficit: Secondary | ICD-10-CM | POA: Diagnosis not present

## 2022-02-14 DIAGNOSIS — R69 Illness, unspecified: Secondary | ICD-10-CM | POA: Diagnosis not present
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
## 2022-02-16 DIAGNOSIS — G3184 Mild cognitive impairment, so stated: Secondary | ICD-10-CM | POA: Diagnosis not present

## 2022-02-16 DIAGNOSIS — G5711 Meralgia paresthetica, right lower limb: Secondary | ICD-10-CM | POA: Diagnosis not present

## 2022-02-16 DIAGNOSIS — R69 Illness, unspecified: Secondary | ICD-10-CM | POA: Diagnosis not present

## 2022-02-16 DIAGNOSIS — Z8619 Personal history of other infectious and parasitic diseases: Secondary | ICD-10-CM | POA: Diagnosis not present

## 2022-02-16 DIAGNOSIS — G5793 Unspecified mononeuropathy of bilateral lower limbs: Secondary | ICD-10-CM | POA: Diagnosis not present

## 2022-02-16 DIAGNOSIS — H53121 Transient visual loss, right eye: Secondary | ICD-10-CM | POA: Diagnosis not present

## 2022-02-16 DIAGNOSIS — E610 Copper deficiency: Secondary | ICD-10-CM | POA: Diagnosis not present

## 2022-02-16 DIAGNOSIS — E560 Deficiency of vitamin E: Secondary | ICD-10-CM | POA: Diagnosis not present

## 2022-02-16 DIAGNOSIS — Z9889 Other specified postprocedural states: Secondary | ICD-10-CM | POA: Diagnosis not present

## 2022-02-16 DIAGNOSIS — G629 Polyneuropathy, unspecified: Secondary | ICD-10-CM | POA: Diagnosis not present

## 2022-02-16 DIAGNOSIS — R269 Unspecified abnormalities of gait and mobility: Secondary | ICD-10-CM | POA: Diagnosis not present

## 2022-02-16 DIAGNOSIS — G43119 Migraine with aura, intractable, without status migrainosus: Secondary | ICD-10-CM | POA: Diagnosis not present

## 2022-02-21 DIAGNOSIS — H2513 Age-related nuclear cataract, bilateral: Secondary | ICD-10-CM | POA: Diagnosis not present

## 2022-02-21 DIAGNOSIS — H538 Other visual disturbances: Secondary | ICD-10-CM | POA: Diagnosis not present

## 2022-02-21 DIAGNOSIS — H40013 Open angle with borderline findings, low risk, bilateral: Secondary | ICD-10-CM | POA: Diagnosis not present

## 2022-02-23 ENCOUNTER — Encounter: Payer: Self-pay | Admitting: Nurse Practitioner

## 2022-02-27 ENCOUNTER — Other Ambulatory Visit: Payer: Self-pay | Admitting: Neurology

## 2022-02-27 DIAGNOSIS — G629 Polyneuropathy, unspecified: Secondary | ICD-10-CM

## 2022-03-05 DIAGNOSIS — G629 Polyneuropathy, unspecified: Secondary | ICD-10-CM | POA: Diagnosis not present

## 2022-03-08 ENCOUNTER — Ambulatory Visit: Admission: RE | Admit: 2022-03-08 | Payer: 59 | Source: Ambulatory Visit

## 2022-03-27 ENCOUNTER — Ambulatory Visit: Payer: 59

## 2022-03-28 ENCOUNTER — Ambulatory Visit: Payer: 59 | Admitting: Psychology

## 2022-04-04 ENCOUNTER — Ambulatory Visit: Payer: 59

## 2022-04-09 DIAGNOSIS — G4719 Other hypersomnia: Secondary | ICD-10-CM | POA: Diagnosis not present

## 2022-05-22 ENCOUNTER — Ambulatory Visit
Admission: RE | Admit: 2022-05-22 | Discharge: 2022-05-22 | Disposition: A | Discharge status: Home / Self Care | Payer: 59 | Source: Ambulatory Visit | Attending: Neurology | Admitting: Neurology

## 2022-05-22 DIAGNOSIS — M40204 Unspecified kyphosis, thoracic region: Secondary | ICD-10-CM | POA: Diagnosis not present

## 2022-05-22 DIAGNOSIS — G629 Polyneuropathy, unspecified: Secondary | ICD-10-CM | POA: Insufficient documentation

## 2022-05-22 DIAGNOSIS — M47812 Spondylosis without myelopathy or radiculopathy, cervical region: Secondary | ICD-10-CM | POA: Diagnosis not present

## 2022-05-22 DIAGNOSIS — M4802 Spinal stenosis, cervical region: Secondary | ICD-10-CM | POA: Diagnosis not present

## 2022-05-22 MED ORDER — GADOBUTROL 1 MMOL/ML IV SOLN
7.0000 mL | Freq: Once | INTRAVENOUS | Status: AC | PRN
  Administered 2022-05-22: 7 mL via INTRAVENOUS

## 2022-06-01 DIAGNOSIS — M5431 Sciatica, right side: Secondary | ICD-10-CM | POA: Diagnosis not present

## 2022-06-01 DIAGNOSIS — M5432 Sciatica, left side: Secondary | ICD-10-CM | POA: Diagnosis not present

## 2022-06-04 ENCOUNTER — Telehealth: Payer: Self-pay | Admitting: Nurse Practitioner

## 2022-06-04 DIAGNOSIS — M47812 Spondylosis without myelopathy or radiculopathy, cervical region: Secondary | ICD-10-CM | POA: Diagnosis not present

## 2022-06-04 DIAGNOSIS — G629 Polyneuropathy, unspecified: Secondary | ICD-10-CM | POA: Diagnosis not present

## 2022-06-04 DIAGNOSIS — E559 Vitamin D deficiency, unspecified: Secondary | ICD-10-CM | POA: Diagnosis not present

## 2022-06-04 DIAGNOSIS — Z8673 Personal history of transient ischemic attack (TIA), and cerebral infarction without residual deficits: Secondary | ICD-10-CM | POA: Diagnosis not present

## 2022-06-04 DIAGNOSIS — E538 Deficiency of other specified B group vitamins: Secondary | ICD-10-CM | POA: Diagnosis not present

## 2022-06-04 NOTE — Telephone Encounter (Signed)
Responded to on mychart.

## 2022-06-04 NOTE — Telephone Encounter (Signed)
Caller states pt is having back pain and is currently seeing different specialist for the pain  Caller wanting provider's recommendation on how many 325 mg of aspirin pt can take within 24 hrs  Caller is listed on dpr and requesting a response via mychart  Please assist further

## 2022-06-11 DIAGNOSIS — M5416 Radiculopathy, lumbar region: Secondary | ICD-10-CM | POA: Diagnosis not present

## 2022-06-11 DIAGNOSIS — R260 Ataxic gait: Secondary | ICD-10-CM | POA: Diagnosis not present

## 2022-06-11 DIAGNOSIS — R2681 Unsteadiness on feet: Secondary | ICD-10-CM | POA: Diagnosis not present

## 2022-06-12 DIAGNOSIS — G629 Polyneuropathy, unspecified: Secondary | ICD-10-CM | POA: Diagnosis not present

## 2022-06-12 DIAGNOSIS — E538 Deficiency of other specified B group vitamins: Secondary | ICD-10-CM | POA: Diagnosis not present

## 2022-06-15 ENCOUNTER — Other Ambulatory Visit: Payer: Self-pay | Admitting: Orthopaedic Surgery

## 2022-06-15 DIAGNOSIS — M5431 Sciatica, right side: Secondary | ICD-10-CM

## 2022-06-18 DIAGNOSIS — E538 Deficiency of other specified B group vitamins: Secondary | ICD-10-CM | POA: Diagnosis not present

## 2022-06-18 NOTE — Progress Notes (Signed)
Referring Physician:  Vladimir Crofts, MD [facility] [place],  [name] [address]  Primary Physician:  [name] Billings, NP  History of Present Illness: 06/19/2022 Mr. [name] [name] is here today with a chief complaint of balance issues and frequent falls.  He is also suffering from weakness of both legs over the past several months.  His left foot weakness has been ongoing for 18 months.  His right foot has been weak for approximately 5 months.  He and his wife report that he has had some issues with his balance with frequent falls over the past several months.  He is also having worsening trouble with weakness in his hands.  He does not report clear dexterity changes, but has had more trouble with some movements of his hands.  He has had intermittent numbness in his hands and forearms.  He has significant issues in his back with pain down his legs.  Standing vertically, lifting items above his head, and certain postures make it worse.  Popping his back makes it better.  Laying down also makes it better.  Bowel/Bladder Dysfunction: none  Conservative measures: ice Physical therapy:  currently participating - initial evaluation completed last week at [facility] Multimodal medical therapy including regular antiinflammatories:  prednisone, robaxin, topical patches, biofreeze, tylenol Injections: has not received epidural steroid injections  Past Surgery: denies  [name] [name] has symptoms of cervical myelopathy.  The symptoms are causing a significant impact on the patient's life.   Review of Systems:  A 10 point review of systems is negative, except for the pertinent positives and negatives detailed in the HPI.  Past Medical History: Past Medical History:  Diagnosis Date   ADHD    Arthritis    Concussion    Hypertension    Pernicious anemia    Vestibular migraine     Past Surgical History: Past Surgical History:  Procedure  Laterality Date   FRACTURE SURGERY     LEG SURGERY     REPAIR ANKLE LIGAMENT     REPAIR KNEE LIGAMENT      Allergies: Allergies as of 06/19/2022   (No Known Allergies)    Medications: Current Meds  Medication Sig   ALPHA LIPOIC ACID PO Take by mouth daily.   aspirin EC 81 MG tablet Take 81 mg by mouth daily. Swallow whole.   Cholecalciferol (VITAMIN D3 PO) Take by mouth daily.   Cyanocobalamin (VITAMIN B 12 PO) Take by mouth daily.   cyanocobalamin (VITAMIN B12) 1000 MCG/ML injection Inject into the muscle.   Multiple Vitamin (MULTIVITAMIN) tablet Take 1 tablet by mouth daily.    Social History: Social History   Tobacco Use   Smoking status: Never   Smokeless tobacco: Never  Vaping Use   Vaping Use: Never used  Substance Use Topics   Alcohol use: Yes    Alcohol/week: 1.0 standard drink of alcohol    Types: 1 Cans of beer per week   Drug use: No    Family Medical History: Family History  Problem Relation Age of Onset   Breast cancer Mother    Thyroid cancer Mother    Lymphoma Father    CVA Father    Heart attack Sister    Hypertension Sister     Physical Examination: Vitals:   06/19/22 1517 06/19/22 1523  BP: (!) 214/122 (!) 168/111  Pulse: 88 78    General: Patient is well developed, well nourished, calm, collected, and in no apparent distress.  Attention to examination is appropriate.  Neck:   Supple.  Full range of motion.  Respiratory: Patient is breathing without any difficulty.   NEUROLOGICAL:     Awake, alert, oriented to person, place, and time.  Speech is clear and fluent. Fund of knowledge is appropriate.   Cranial Nerves: Pupils equal round and reactive to light.  Facial tone is symmetric.  Facial sensation is symmetric. Shoulder shrug is symmetric. Tongue protrusion is midline.  There is no pronator drift.  ROM of spine: full.    Strength: Side Biceps Triceps Deltoid Interossei Grip Wrist Ext. Wrist Flex.  R '5 5 5 4 4 '$ 4+ 5  L '5 5 5  '$ 4+ 4+ 5 5   Side Iliopsoas Quads Hamstring PF DF EHL  R '5 5 5 '$ 4- 3 4+  L '5 5 5 '$ 4- 3 2   Reflexes are 1+ and symmetric at the biceps, triceps, brachioradialis, [name] and achilles.   Hoffman's is present.   Bilateral upper and lower extremity sensation shows numbness in his legs below the knee. No evidence of dysmetria noted.  Gait is abnormal and wide-based.  .     Medical Decision Making  Imaging: MRI C spine 05/24/2022   IMPRESSION: 1. Multilevel cervical spondylosis with resultant moderate to severe spinal stenosis at C4-5 through C6-7, most pronounced at C4-5. Suspected subtle cord signal changes at the level of C4-5, suggesting compressive myelomalacia. 2. Moderate bilateral C4, C5, and C7 foraminal stenosis related to disc osteophyte and facet disease. 3. Multiple remote lacunar infarcts about the visualized pons/brainstem.     Electronically Signed   By: Jeannine Boga M.D.   On: 05/24/2022 04:54  MRI T spine 05/24/2022 IMPRESSION: 1. Multilevel degenerative spondylosis as detailed above. No significant spinal stenosis or evidence for cord impingement. Normal MRI appearance of the thoracic spinal cord. 2. Moderate bilateral foraminal narrowing at the T1-2 level related to disc bulge and endplate spurring. No other significant foraminal encroachment within the thoracic spine. 3. Mild chronic compression deformity at the superior endplate of L1 without retropulsion.     Electronically Signed   By: Jeannine Boga M.D.   On: 05/24/2022 05:07  I have personally reviewed the images and agree with the above interpretation.  Assessment and Plan: [name] [name] is a pleasant 61 y.o. male with cervical stenosis from C4-C7.  He has myelomalacia at C4-5.  He has severe neuroforaminal stenosis at multiple levels including C6-7.  He has symptoms of clinical cervical myelopathy including increasing weakness in his hands as well as trouble with his balance.  He has  weakness in his lower limbs as well, which could be the result of the cervical stenosis though could also be multifactorial.  Given his worsening weakness in his arms and symptoms of myelopathy, I feel that surgical intervention is indicated.  There is no role for conservative management for this condition.  I recommended C4-7 anterior cervical discectomy and fusion.  I discussed the planned procedure at length with the patient, including the risks, benefits, alternatives, and indications. The risks discussed include but are not limited to bleeding, infection, need for reoperation, spinal fluid leak, stroke, vision loss, anesthetic complication, coma, paralysis, and even death. We also discussed the possibility of post-operative dysphagia, vocal cord paralysis, and the risk of adjacent segment disease in the future. I also described in detail that improvement was not guaranteed.  The patient expressed understanding of these risks, and asked that we proceed with surgery. I described the surgery  in layman's terms, and gave ample opportunity for questions, which were answered to the best of my ability.  He is seeing his primary care provider next week.  His blood pressure was significantly elevated today.  He will work with his primary care provider to obtain better control of his blood pressure.  Once that is stabilized, we will move forward with surgical intervention.  He is also getting lumbar spine MRI scan tonight.  He may ultimately need to have pathology in that area as well, but his cervical spine would need to be addressed prior to any consideration of lower spine intervention.   I spent a total of 45 minutes in face-to-face and non-face-to-face activities related to this patient's care today.  Thank you for involving me in the care of this patient.      Zoey Bidwell K. Izora Ribas MD, [facility]

## 2022-06-19 ENCOUNTER — Ambulatory Visit
Admission: RE | Admit: 2022-06-19 | Discharge: 2022-06-19 | Disposition: A | Discharge status: Home / Self Care | Payer: 59 | Source: Ambulatory Visit | Attending: Orthopaedic Surgery | Admitting: Orthopaedic Surgery

## 2022-06-19 ENCOUNTER — Encounter: Payer: Self-pay | Admitting: Neurosurgery

## 2022-06-19 ENCOUNTER — Ambulatory Visit (INDEPENDENT_AMBULATORY_CARE_PROVIDER_SITE_OTHER): Payer: 59 | Admitting: Neurosurgery

## 2022-06-19 VITALS — BP 168/111 | HR 78 | Ht 67.0 in | Wt 159.6 lb

## 2022-06-19 DIAGNOSIS — M5412 Radiculopathy, cervical region: Secondary | ICD-10-CM

## 2022-06-19 DIAGNOSIS — Z043 Encounter for examination and observation following other accident: Secondary | ICD-10-CM | POA: Diagnosis not present

## 2022-06-19 DIAGNOSIS — M5431 Sciatica, right side: Secondary | ICD-10-CM | POA: Insufficient documentation

## 2022-06-19 DIAGNOSIS — M4802 Spinal stenosis, cervical region: Secondary | ICD-10-CM

## 2022-06-19 DIAGNOSIS — G959 Disease of spinal cord, unspecified: Secondary | ICD-10-CM

## 2022-06-21 ENCOUNTER — Telehealth: Payer: Self-pay

## 2022-06-21 ENCOUNTER — Encounter: Payer: Self-pay | Admitting: Nurse Practitioner

## 2022-06-21 ENCOUNTER — Encounter: Payer: Self-pay | Admitting: Neurosurgery

## 2022-06-21 NOTE — Telephone Encounter (Signed)
-----   Message from Peggyann Shoals sent at 06/21/2022 11:44 AM EDT ----- Regarding: schedule sx Contact: [phone] Manuela Schwartz called patient wants to move forward with surgery. Is December 11 or 13 available?

## 2022-06-22 NOTE — Telephone Encounter (Signed)
I spoke with [name]. We discussed the following information. I also sent a mychart message with a recap of what we discussed.   Planned surgery: C4-7 anterior cervical discectomy and fusion   Surgery date: 07/30/22 - you will find out your arrival time the business day before your surgery.   Pre-op appointment at [facility]: we will call you with a date/time for this. Pre-admit testing is located on the first floor of the [facility], [place], [address]. Please bring all prescriptions in the original prescription bottles to your appointment, even if you have reviewed medications by phone with a pharmacy representative. Pre-op labs may be done at your pre-op appointment. You are not required to fast for these labs. Should you need to change your pre-op appointment, please call Pre-admit testing at [phone].    Blood thinners: ok to stay on aspirin '81mg'$ ; no aspirin '325mg'$  for 7 days prior or 14 days after   NSAIDS (Non-steroidal anti-inflammatory drugs): because you are having a fusion, no NSAIDS (such as ibuprofen, aleve, naproxen, meloxicam, diclofenac) for 3 months after surgery. Celebrex is an exception. Tylenol is ok because it is not an NSAID.    Because you are having a fusion: for appointments after your 2 week follow-up: please arrive at the [facility] outpatient imaging center ([place], [place]) or [facility] one hour prior to your appointment for x-rays. This applies to every appointment after your 2 week follow-up.

## 2022-06-25 ENCOUNTER — Encounter: Payer: Self-pay | Admitting: Nurse Practitioner

## 2022-06-25 ENCOUNTER — Ambulatory Visit (INDEPENDENT_AMBULATORY_CARE_PROVIDER_SITE_OTHER): Payer: 59 | Admitting: Nurse Practitioner

## 2022-06-25 VITALS — BP 136/88 | HR 72 | Temp 98.2°F | Ht 67.0 in | Wt 151.0 lb

## 2022-06-25 DIAGNOSIS — Z23 Encounter for immunization: Secondary | ICD-10-CM

## 2022-06-25 DIAGNOSIS — I10 Essential (primary) hypertension: Secondary | ICD-10-CM | POA: Diagnosis not present

## 2022-06-25 DIAGNOSIS — E538 Deficiency of other specified B group vitamins: Secondary | ICD-10-CM | POA: Diagnosis not present

## 2022-06-25 DIAGNOSIS — E559 Vitamin D deficiency, unspecified: Secondary | ICD-10-CM | POA: Diagnosis not present

## 2022-06-25 DIAGNOSIS — E7800 Pure hypercholesterolemia, unspecified: Secondary | ICD-10-CM

## 2022-06-25 DIAGNOSIS — R7303 Prediabetes: Secondary | ICD-10-CM

## 2022-06-25 DIAGNOSIS — Z0000 Encounter for general adult medical examination without abnormal findings: Secondary | ICD-10-CM

## 2022-06-25 DIAGNOSIS — G629 Polyneuropathy, unspecified: Secondary | ICD-10-CM | POA: Diagnosis not present

## 2022-06-25 LAB — URINALYSIS, ROUTINE W REFLEX MICROSCOPIC
Bilirubin, UA: NEGATIVE
Glucose, UA: NEGATIVE
Leukocytes,UA: NEGATIVE
Nitrite, UA: NEGATIVE
RBC, UA: NEGATIVE
Specific Gravity, UA: 1.025 (ref 1.005–1.030)
Urobilinogen, Ur: 0.2 mg/dL (ref 0.2–1.0)
pH, UA: 6 (ref 5.0–7.5)

## 2022-06-25 LAB — MICROSCOPIC EXAMINATION
Bacteria, UA: NONE SEEN
RBC, Urine: NONE SEEN /hpf (ref 0–2)

## 2022-06-25 MED ORDER — LISINOPRIL 10 MG PO TABS: 10.0000 mg | ORAL_TABLET | Freq: Every day | ORAL | 0 refills | Status: DC

## 2022-06-25 NOTE — Progress Notes (Signed)
BP 136/88   Pulse 72   Temp 98.2 F (36.8 C) (Oral)   Ht '5\' 7"'$  (1.702 m)   Wt 151 lb (68.5 kg)   SpO2 98%   BMI 23.65 kg/m    Subjective:    Patient ID: [name], male    DOB: Apr 18, 1961, 61 y.o.   MRN: [id number]  HPI: [name] is a 61 y.o. male presenting on 06/25/2022 for comprehensive medical examination. Current medical complaints include: multiple concerns  He currently lives with: Interim Problems from his last visit: no  HYPERTENSION with Chronic Kidney Disease Hypertension status: uncontrolled  Satisfied with current treatment? no Duration of hypertension: years BP monitoring frequency:  not checking BP range:  BP medication side effects:  no Medication compliance: excellent compliance Previous BP meds:none Aspirin: no Recurrent headaches: no Visual changes: no Palpitations: no Dyspnea: no Chest pain: no Lower extremity edema: no Dizzy/lightheaded: no  Patient has been getting b12 injections at Dr. Trena Platt office.    CERVICAL STENOSIS Will have surgery with neurosurgeon in December.  Depression Screen done today and results listed below:     06/19/2021    9:30 AM 03/27/2021    3:34 PM 11/01/2020    3:45 PM 02/18/2020    1:23 PM 04/30/2018   10:05 AM  Depression screen PHQ 2/9  Decreased Interest 0 0 0 0 0  Down, Depressed, Hopeless 0 0 0 0 0  PHQ - 2 Score 0 0 0 0 0  Altered sleeping 0 0 1 0   Tired, decreased energy 0 0 1 1 0  Change in appetite 0 0 1 1 0  Feeling bad or failure about yourself  0 0 0 0 0  Trouble concentrating 0 0 0 0 0  Moving slowly or fidgety/restless 0 0 0 0 0  Suicidal thoughts 0 0 0 0 0  PHQ-9 Score 0 0 3 2   Difficult doing work/chores  Not difficult at all Not difficult at all Not difficult at all Not difficult at all    The patient does a history of falls. I did complete a risk assessment for falls. A plan of care for falls was documented.   Past Medical History:  Past Medical History:  Diagnosis  Date   ADHD    Arthritis    Concussion    Hypertension    Multiple lacunar infarcts (HCC)    Pernicious anemia    Vestibular migraine    Vitamin B12 deficiency    Vitamin D deficiency     Surgical History:  Past Surgical History:  Procedure Laterality Date   FRACTURE SURGERY     LEG SURGERY     REPAIR ANKLE LIGAMENT     REPAIR KNEE LIGAMENT      Medications:  Current Outpatient Medications on File Prior to Visit  Medication Sig   ALPHA LIPOIC ACID PO Take by mouth daily.   aspirin EC 81 MG tablet Take 81 mg by mouth daily. Swallow whole.   Cholecalciferol (VITAMIN D3 PO) Take by mouth daily.   Cyanocobalamin (VITAMIN B 12 PO) Take by mouth daily.   cyanocobalamin (VITAMIN B12) 1000 MCG/ML injection Inject into the muscle.   Multiple Vitamin (MULTIVITAMIN) tablet Take 1 tablet by mouth daily.   rizatriptan (MAXALT) 10 MG tablet Take 1 tablet (10 mg total) by mouth as needed for migraine. May repeat in 2 hours if needed   No current facility-administered medications on file prior to visit.    Allergies:  No Known Allergies  Social History:  Social History   Socioeconomic History   Marital status: Married    Spouse name: Not on file   Number of children: Not on file   Years of education: Not on file   Highest education level: Not on file  Occupational History   Not on file  Tobacco Use   Smoking status: Never   Smokeless tobacco: Never  Vaping Use   Vaping Use: Never used  Substance and Sexual Activity   Alcohol use: Yes    Alcohol/week: 1.0 standard drink of alcohol    Types: 1 Cans of beer per week   Drug use: No   Sexual activity: Not Currently  Other Topics Concern   Not on file  Social History Narrative   Not on file   Social Determinants of Health   Financial Resource Strain: Not on file  Food Insecurity: Not on file  Transportation Needs: Not on file  Physical Activity: Not on file  Stress: Not on file  Social Connections: Not on file   Intimate Partner Violence: Not on file   Social History   Tobacco Use  Smoking Status Never  Smokeless Tobacco Never   Social History   Substance and Sexual Activity  Alcohol Use Yes   Alcohol/week: 1.0 standard drink of alcohol   Types: 1 Cans of beer per week    Family History:  Family History  Problem Relation Age of Onset   Breast cancer Mother    Thyroid cancer Mother    Lymphoma Father    CVA Father    Heart attack Sister    Hypertension Sister     Past medical history, surgical history, medications, allergies, family history and social history reviewed with patient today and changes made to appropriate areas of the chart.   Review of Systems  Eyes:  Negative for blurred vision and double vision.  Respiratory:  Negative for shortness of breath.   Cardiovascular:  Negative for chest pain, palpitations and leg swelling.  Neurological:  Positive for tingling and weakness. Negative for dizziness and headaches.   All other ROS negative except what is listed above and in the HPI.      Objective:    BP 136/88   Pulse 72   Temp 98.2 F (36.8 C) (Oral)   Ht '5\' 7"'$  (1.702 m)   Wt 151 lb (68.5 kg)   SpO2 98%   BMI 23.65 kg/m   Wt Readings from Last 3 Encounters:  06/25/22 151 lb (68.5 kg)  06/19/22 159 lb 9.6 oz (72.4 kg)  01/29/22 157 lb (71.2 kg)    Physical Exam Vitals and nursing note reviewed.  Constitutional:      General: He is not in acute distress.    Appearance: Normal appearance. He is normal weight. He is not ill-appearing, toxic-appearing or diaphoretic.  HENT:     Head: Normocephalic.     Right Ear: Tympanic membrane, ear canal and external ear normal.     Left Ear: Tympanic membrane, ear canal and external ear normal.     Nose: Nose normal. No congestion or rhinorrhea.     Mouth/Throat:     Mouth: Mucous membranes are moist.  Eyes:     General:        Right eye: No discharge.        Left eye: No discharge.     Extraocular Movements:  Extraocular movements intact.     Conjunctiva/sclera: Conjunctivae normal.  Pupils: Pupils are equal, round, and reactive to light.  Cardiovascular:     Rate and Rhythm: Normal rate and regular rhythm.     Heart sounds: No murmur heard. Pulmonary:     Effort: Pulmonary effort is normal. No respiratory distress.     Breath sounds: Normal breath sounds. No wheezing, rhonchi or rales.  Abdominal:     General: Abdomen is flat. Bowel sounds are normal. There is no distension.     Palpations: Abdomen is soft.     Tenderness: There is no abdominal tenderness. There is no guarding.  Musculoskeletal:     Cervical back: Normal range of motion and neck supple.  Skin:    General: Skin is warm and dry.     Capillary Refill: Capillary refill takes less than 2 seconds.  Neurological:     General: No focal deficit present.     Mental Status: He is alert and oriented to person, place, and time.     Cranial Nerves: No cranial nerve deficit.     Motor: No weakness.     Deep Tendon Reflexes: Reflexes normal.  Psychiatric:        Mood and Affect: Mood normal.        Behavior: Behavior normal.        Thought Content: Thought content normal.        Judgment: Judgment normal.     Results for orders placed or performed in visit on 01/29/22  Surgical pathology  Result Value Ref Range   SURGICAL PATHOLOGY      SURGICAL PATHOLOGY CASE: [id number] PATIENT: Leaman Busta Surgical Pathology Report     Clinical History: Neoplasm of skin, irregular hyperpigmented lesion (nt)     FINAL MICROSCOPIC DIAGNOSIS:  A.   SKIN, RIGHT FOREARM, BIOPSY: SEBORRHEIC KERATOSIS AND SOLAR LENTIGO, LATERAL MARGINS INVOLVED COMMENT: Sections show benign pigmented epidermal hyperplasia adjacent to elongated rete with hyperpigmentation. No significant melanocytic atypia is seen.    GROSS DESCRIPTION:  A. Received in formalin labeled with the patients name and DOB is a 0.8 x 0.7 cm light tan skin  shave that is inked black, sectioned, and entirely submitted in a single cassette.  (LEF 01/31/2022)   Final Diagnosis performed by Duard Brady, MD.   Electronically signed 01/31/2022 Technical component performed at [employer]. [facility], [place] [address] [place], [place] [address].  Professional component performed at [facility]. [address] [place], [place], [place] [address].  Immunohistochemistry Technical component (if applicable) was performed at [facility]. [address], [place], [place], [place] [address].   IMMUNOHISTOCHEMISTRY DISCLAIMER (if applicable): Some of these immunohistochemical stains may have been developed and the performance characteristics determine by [facility]. Some may not have been cleared or approved by the U.S. Food and Drug Administration. The FDA has determined that such clearance or approval is not necessary. This test is used for clinical purposes. It should not be regarded as investigational or for research. This laboratory is certified under the [place] (CLIA-88) as qualified to perform high complexity clinical laboratory testing.  The controls stained appropriately.       Assessment & Plan:   Problem List Items Addressed This Visit       Cardiovascular and Mediastinum   Essential hypertension    Chronic. Not well controlled. Will start Lisinopril '10mg'$  daily.  Side effects and benefits of medication discussed during visit today.  Labs ordered.  Follow up in  1 month.  Call sooner if concerns arise.       Relevant Medications   lisinopril (ZESTRIL) 10 MG tablet   Other Relevant Orders   CBC with Differential/Platelet     Other   Prediabetes    Labs ordered at visit today.  Will make recommendations based on lab results.        Relevant Orders   HgB A1c   Vitamin D deficiency    Labs ordered at visit today.  Will  make recommendations based on lab results.        Relevant Orders   Vitamin D (25 hydroxy)   B12 deficiency    Chronic. Has been followed by Dr. Manuella Ghazi.  Received 4 weekly injections.  Then supposed to change to monthly injections.  Received 1 injection today. Will start monthly injections at next visit.  Will check labs at next visit.  Follow up in 1 month.  Call sooner if concerns arise.       Elevated LDL cholesterol level    Chronic.  Controlled.  Continue with current medication regimen.  Labs ordered today.  Return to clinic in 6 months for reevaluation.  Call sooner if concerns arise.        Relevant Orders   Lipid panel   Other Visit Diagnoses     Annual physical exam    -  Primary   Health maintenance reviewed during visit today.  Labs ordered. Flu shot given. Colonoscopy up to date.   Relevant Orders   TSH   PSA   Lipid panel   CBC with Differential/Platelet   Comprehensive metabolic panel   Urinalysis, Routine w reflex microscopic   Need for influenza vaccination       Relevant Orders   Flu Vaccine QUAD 6+ mos PF IM (Fluarix Quad PF) (Completed)        Discussed aspirin prophylaxis for myocardial infarction prevention and decision was it was not indicated  LABORATORY TESTING:  Health maintenance labs ordered today as discussed above.   The natural history of prostate cancer and ongoing controversy regarding screening and potential treatment outcomes of prostate cancer has been discussed with the patient. The meaning of a false positive PSA and a false negative PSA has been discussed. He indicates understanding of the limitations of this screening test and wishes to proceed with screening PSA testing.   IMMUNIZATIONS:   - Tdap: Tetanus vaccination status reviewed: last tetanus booster within 10 years. - Influenza: Administered today - Pneumovax: Not applicable - Prevnar: Not applicable - COVID: Up to date - HPV: Not applicable - Shingrix vaccine: Up to  date  SCREENING: - Colonoscopy: Up to date  Discussed with patient purpose of the colonoscopy is to detect colon cancer at curable precancerous or early stages   - AAA Screening: Not applicable  -Hearing Test: Not applicable  -Spirometry: Not applicable   PATIENT COUNSELING:    Sexuality: Discussed sexually transmitted diseases, partner selection, use of condoms, avoidance of unintended pregnancy  and contraceptive alternatives.   Advised to avoid cigarette smoking.  I discussed with the patient that most people either abstain from alcohol or drink within safe limits (<=14/week and <=4 drinks/occasion for males, <=7/weeks and <= 3 drinks/occasion for females) and that the risk for alcohol disorders and other health effects rises proportionally with the number of drinks per week and how often a drinker exceeds daily limits.  Discussed cessation/primary prevention of drug use and availability of treatment for abuse.   Diet: Encouraged  to adjust caloric intake to maintain  or achieve ideal body weight, to reduce intake of dietary saturated fat and total fat, to limit sodium intake by avoiding high sodium foods and not adding table salt, and to maintain adequate dietary potassium and calcium preferably from fresh fruits, vegetables, and low-fat dairy products.    stressed the importance of regular exercise  Injury prevention: Discussed safety belts, safety helmets, smoke detector, smoking near bedding or upholstery.   Dental health: Discussed importance of regular tooth brushing, flossing, and dental visits.   Follow up plan: NEXT PREVENTATIVE PHYSICAL DUE IN 1 YEAR. Return in about 1 month (around 07/25/2022) for BP Check and B12 level.

## 2022-06-25 NOTE — Assessment & Plan Note (Signed)
Chronic. Not well controlled. Will start Lisinopril '10mg'$  daily.  Side effects and benefits of medication discussed during visit today.  Labs ordered.  Follow up in 1 month.  Call sooner if concerns arise.

## 2022-06-25 NOTE — Assessment & Plan Note (Signed)
Chronic. Has been followed by Dr. Manuella Ghazi.  Received 4 weekly injections.  Then supposed to change to monthly injections.  Received 1 injection today. Will start monthly injections at next visit.  Will check labs at next visit.  Follow up in 1 month.  Call sooner if concerns arise.

## 2022-06-25 NOTE — Assessment & Plan Note (Signed)
Labs ordered at visit today.  Will make recommendations based on lab results.   

## 2022-06-25 NOTE — Assessment & Plan Note (Signed)
Chronic.  Controlled.  Continue with current medication regimen.  Labs ordered today.  Return to clinic in 6 months for reevaluation.  Call sooner if concerns arise.  ? ?

## 2022-06-26 LAB — LIPID PANEL
Chol/HDL Ratio: 2.7 ratio (ref 0.0–5.0)
Cholesterol, Total: 184 mg/dL (ref 100–199)
HDL: 67 mg/dL (ref >39)
LDL Chol Calc (NIH): 94 mg/dL (ref 0–99)
Triglycerides: 132 mg/dL (ref 0–149)
VLDL Cholesterol Cal: 23 mg/dL (ref 5–40)

## 2022-06-26 LAB — CBC WITH DIFFERENTIAL/PLATELET
Basophils Absolute: 0.1 10*3/uL (ref 0.0–0.2)
Basos: 1 %
EOS (ABSOLUTE): 0.1 10*3/uL (ref 0.0–0.4)
Eos: 1 %
Hematocrit: 46.1 % (ref 37.5–51.0)
Hemoglobin: 15.9 g/dL (ref 13.0–17.7)
Immature Grans (Abs): 0 10*3/uL (ref 0.0–0.1)
Immature Granulocytes: 0 %
Lymphocytes Absolute: 2.1 10*3/uL (ref 0.7–3.1)
Lymphs: 33 %
MCH: 31.4 pg (ref 26.6–33.0)
MCHC: 34.5 g/dL (ref 31.5–35.7)
MCV: 91 fL (ref 79–97)
Monocytes Absolute: 0.5 10*3/uL (ref 0.1–0.9)
Monocytes: 7 %
Neutrophils Absolute: 3.8 10*3/uL (ref 1.4–7.0)
Neutrophils: 58 %
Platelets: 223 10*3/uL (ref 150–450)
RBC: 5.07 x10E6/uL (ref 4.14–5.80)
RDW: 13.1 % (ref 11.6–15.4)
WBC: 6.5 10*3/uL (ref 3.4–10.8)

## 2022-06-26 LAB — COMPREHENSIVE METABOLIC PANEL
ALT: 21 IU/L (ref 0–44)
AST: 25 IU/L (ref 0–40)
Albumin/Globulin Ratio: 2.1 (ref 1.2–2.2)
Albumin: 4.5 g/dL (ref 3.9–4.9)
Alkaline Phosphatase: 82 IU/L (ref 44–121)
BUN/Creatinine Ratio: 20 (ref 10–24)
BUN: 15 mg/dL (ref 8–27)
Bilirubin Total: 0.7 mg/dL (ref 0.0–1.2)
CO2: 21 mmol/L (ref 20–29)
Calcium: 9.1 mg/dL (ref 8.6–10.2)
Chloride: 103 mmol/L (ref 96–106)
Creatinine, Ser: 0.74 mg/dL — ABNORMAL LOW (ref 0.76–1.27)
Globulin, Total: 2.1 g/dL (ref 1.5–4.5)
Glucose: 98 mg/dL (ref 70–99)
Potassium: 4.7 mmol/L (ref 3.5–5.2)
Sodium: 139 mmol/L (ref 134–144)
Total Protein: 6.6 g/dL (ref 6.0–8.5)
eGFR: 102 mL/min/{1.73_m2} (ref >59)

## 2022-06-26 LAB — PSA: Prostate Specific Ag, Serum: 1.4 ng/mL (ref 0.0–4.0)

## 2022-06-26 LAB — HEMOGLOBIN A1C
Est. average glucose Bld gHb Est-mCnc: 117 mg/dL
Hgb A1c MFr Bld: 5.7 % — ABNORMAL HIGH (ref 4.8–5.6)

## 2022-06-26 LAB — VITAMIN D 25 HYDROXY (VIT D DEFICIENCY, FRACTURES): Vit D, 25-Hydroxy: 34.2 ng/mL (ref 30.0–100.0)

## 2022-06-26 LAB — TSH: TSH: 0.887 u[IU]/mL (ref 0.450–4.500)

## 2022-06-26 NOTE — Progress Notes (Signed)
Hi [name]. It was good to see you yesterday.  Your lab work looks good.  A1c is well controlled in the prediabetic range at 5.7.  Your thyroid and Prostate screening labs look good.  Vitamin D is within normal range.  Continue with vitamin D supplement. Continue with current medication regimen.  Follow up as discussed.

## 2022-06-29 DIAGNOSIS — R2681 Unsteadiness on feet: Secondary | ICD-10-CM | POA: Diagnosis not present

## 2022-06-29 DIAGNOSIS — M5416 Radiculopathy, lumbar region: Secondary | ICD-10-CM | POA: Diagnosis not present

## 2022-06-29 DIAGNOSIS — R260 Ataxic gait: Secondary | ICD-10-CM | POA: Diagnosis not present

## 2022-07-03 ENCOUNTER — Telehealth: Payer: Self-pay | Admitting: Nurse Practitioner

## 2022-07-03 NOTE — Telephone Encounter (Signed)
That is fine.  He can leave them here.

## 2022-07-03 NOTE — Telephone Encounter (Signed)
Copied from [place] [id number]. Topic: General - Inquiry >> Jul 03, 2022  9:34 AM [name] wrote: Reason for CRM: Pt wife stated she has 4-5 B12 shot vials that were picked up from a last Rx that a previous provider had given pt.  [name] pt wife asked if she could bring them to the office and leave them there for pt to come in and get his B12 shots, as pt will forget, and she would like to just drop them off at the office if possible. Wife asked to just please send a message via MyChart with a response.  Please advise.

## 2022-07-03 NOTE — Telephone Encounter (Signed)
Spoke with wife Manuela Schwartz, I have told her it is okay to leave B-12 vials here in the office. She states she will drop them off either today or tomorrow when she is in town.

## 2022-07-04 DIAGNOSIS — M5416 Radiculopathy, lumbar region: Secondary | ICD-10-CM | POA: Diagnosis not present

## 2022-07-04 DIAGNOSIS — R2681 Unsteadiness on feet: Secondary | ICD-10-CM | POA: Diagnosis not present

## 2022-07-04 DIAGNOSIS — R260 Ataxic gait: Secondary | ICD-10-CM | POA: Diagnosis not present

## 2022-07-09 ENCOUNTER — Other Ambulatory Visit: Payer: Self-pay

## 2022-07-09 DIAGNOSIS — Z01818 Encounter for other preprocedural examination: Secondary | ICD-10-CM

## 2022-07-09 DIAGNOSIS — R2681 Unsteadiness on feet: Secondary | ICD-10-CM | POA: Diagnosis not present

## 2022-07-09 DIAGNOSIS — R260 Ataxic gait: Secondary | ICD-10-CM | POA: Diagnosis not present

## 2022-07-09 DIAGNOSIS — M5416 Radiculopathy, lumbar region: Secondary | ICD-10-CM | POA: Diagnosis not present

## 2022-07-11 DIAGNOSIS — M5416 Radiculopathy, lumbar region: Secondary | ICD-10-CM | POA: Diagnosis not present

## 2022-07-11 DIAGNOSIS — R260 Ataxic gait: Secondary | ICD-10-CM | POA: Diagnosis not present

## 2022-07-11 DIAGNOSIS — R2681 Unsteadiness on feet: Secondary | ICD-10-CM | POA: Diagnosis not present

## 2022-07-13 ENCOUNTER — Encounter: Payer: Self-pay | Admitting: Nurse Practitioner

## 2022-07-16 ENCOUNTER — Telehealth: Payer: Self-pay

## 2022-07-16 NOTE — Telephone Encounter (Signed)
I contacted [facility] requesting to change the code to 20931 and use [facility]. The representative would not take all the information from me, only that we would like to ask about changing to 20931 and said she would send a note to the nurse requesting the nurse to call me back. I have not canceled the peer to peer at this time since we do not have an answer yet.

## 2022-07-16 NOTE — Telephone Encounter (Signed)
[employer] left a message on 07/13/22 that 22853 is denied because we requested to use [facility] and "synthetic cages are no more effective than bone graft". Peer to peer must be done within 14 days by calling [phone], option 3.

## 2022-07-16 NOTE — Telephone Encounter (Addendum)
I scheduled this for 07/19/22 from 11am to 1pm with Dr Doyce Para- They will call the department # first and your cell as the secondary number.

## 2022-07-17 ENCOUNTER — Telehealth: Payer: Self-pay | Admitting: Nurse Practitioner

## 2022-07-18 NOTE — Telephone Encounter (Signed)
Called and LVM letting patient's wife know that this would further be addressed at the patient's upcoming follow up.

## 2022-07-18 NOTE — Telephone Encounter (Signed)
Do you want to do this or what until follow up on 07/26/22?

## 2022-07-18 NOTE — Telephone Encounter (Addendum)
Pt wife is requesting a year-long refill on the medication lisinopril (ZESTRIL) 10 MG tablet. Wife stated refills need to be in 30-day increments so insurance will pay for medication. Mentioned medication is working very well; bp has lowered and is stable and wants to make sure refills are available when needed.  Please advise.

## 2022-07-18 NOTE — Telephone Encounter (Signed)
Requested medication (s) are due for refill today:   Provider to review during f/u appt. On 07/26/2022.   Requested medication (s) are on the active medication list:   Yes  Future visit scheduled:   Yes 07/26/2022 with Jon Billings.  New med started.  This is the 1 mo. F/u   Last ordered: 06/25/2022 #60, 0 refills  Returned for provider to review since this is a newly started med and has f/u appt. Coming up.   Requested Prescriptions  Pending Prescriptions Disp Refills   lisinopril (ZESTRIL) 10 MG tablet [Pharmacy Med Name: LISINOPRIL 10 MG TABLET] 90 tablet 1    Sig: TAKE 1 TABLET BY MOUTH EVERY DAY     Cardiovascular:  ACE Inhibitors Failed - 07/17/2022 12:34 PM      Failed - Cr in normal range and within 180 days    Creatinine  Date Value Ref Range Status  11/13/2011 0.70 0.60 - 1.30 mg/dL Final   Creatinine, Ser  Date Value Ref Range Status  06/25/2022 0.74 (L) 0.76 - 1.27 mg/dL Final         Passed - K in normal range and within 180 days    Potassium  Date Value Ref Range Status  06/25/2022 4.7 3.5 - 5.2 mmol/L Final  11/13/2011 4.1 3.5 - 5.1 mmol/L Final         Passed - Patient is not pregnant      Passed - Last BP in normal range    BP Readings from Last 1 Encounters:  06/25/22 136/88         Passed - Valid encounter within last 6 months    Recent Outpatient Visits           3 weeks ago Annual physical exam   [facility] Jon Billings, NP   5 months ago Neoplasm of skin   [place], [place], DO   9 months ago Cerebral atrophy ([place])   [place], Karen, NP   1 year ago Essential hypertension   [place] Jon Billings, NP   1 year ago Encounter for annual physical exam   [facility], Dionne Bucy, MD       Future Appointments             In 1 week Jon Billings, NP [facility], [place]

## 2022-07-18 NOTE — Telephone Encounter (Signed)
Once patient is on stable does a longer prescription will be sent.  We will discuss this further at follow up visit.

## 2022-07-18 NOTE — Telephone Encounter (Signed)
I have not received a call back from [employer] since I called and requested to add [id number] to avoid a peer to peer. However, on their portal, they have since added and approved 20930, not 20931. The peer to peer was originally scheduled for 07/19/22 from 11am - 1pm.

## 2022-07-19 DIAGNOSIS — R260 Ataxic gait: Secondary | ICD-10-CM | POA: Diagnosis not present

## 2022-07-19 DIAGNOSIS — M5416 Radiculopathy, lumbar region: Secondary | ICD-10-CM | POA: Diagnosis not present

## 2022-07-19 DIAGNOSIS — R2681 Unsteadiness on feet: Secondary | ICD-10-CM | POA: Diagnosis not present

## 2022-07-19 NOTE — Telephone Encounter (Signed)
Peer to peer completed. Approved for 20931 for [facility]

## 2022-07-20 ENCOUNTER — Inpatient Hospital Stay: Admission: RE | Admit: 2022-07-20 | Payer: 59 | Source: Ambulatory Visit

## 2022-07-22 ENCOUNTER — Encounter: Payer: Self-pay | Admitting: Neurosurgery

## 2022-07-23 ENCOUNTER — Other Ambulatory Visit: Payer: Self-pay

## 2022-07-23 ENCOUNTER — Encounter
Admission: RE | Admit: 2022-07-23 | Discharge: 2022-07-23 | Disposition: A | Discharge status: Home / Self Care | Payer: 59 | Source: Ambulatory Visit | Attending: Neurosurgery | Admitting: Neurosurgery

## 2022-07-23 VITALS — BP 135/86 | HR 74 | Resp 14 | Ht 67.0 in | Wt 155.0 lb

## 2022-07-23 DIAGNOSIS — Z01818 Encounter for other preprocedural examination: Secondary | ICD-10-CM | POA: Diagnosis not present

## 2022-07-23 DIAGNOSIS — I10 Essential (primary) hypertension: Secondary | ICD-10-CM | POA: Diagnosis not present

## 2022-07-23 DIAGNOSIS — Z01810 Encounter for preprocedural cardiovascular examination: Secondary | ICD-10-CM | POA: Diagnosis not present

## 2022-07-23 DIAGNOSIS — R7303 Prediabetes: Secondary | ICD-10-CM

## 2022-07-23 DIAGNOSIS — E7800 Pure hypercholesterolemia, unspecified: Secondary | ICD-10-CM | POA: Insufficient documentation

## 2022-07-23 HISTORY — DX: Spotted fever due to Rickettsia rickettsii: A77.0

## 2022-07-23 HISTORY — DX: Spinal stenosis, site unspecified: M48.00

## 2022-07-23 HISTORY — DX: Nonrheumatic mitral (valve) prolapse: I34.1

## 2022-07-23 LAB — URINALYSIS, ROUTINE W REFLEX MICROSCOPIC
Bilirubin Urine: NEGATIVE
Glucose, UA: NEGATIVE mg/dL
Hgb urine dipstick: NEGATIVE
Ketones, ur: 5 mg/dL — AB
Leukocytes,Ua: NEGATIVE
Nitrite: NEGATIVE
Protein, ur: NEGATIVE mg/dL
Specific Gravity, Urine: 1.009 (ref 1.005–1.030)
pH: 7 (ref 5.0–8.0)

## 2022-07-23 LAB — SURGICAL PCR SCREEN
MRSA, PCR: NEGATIVE
Staphylococcus aureus: NEGATIVE

## 2022-07-23 NOTE — Patient Instructions (Signed)
Your procedure is scheduled on: 07/30/22 Report to [facility]. To find out your arrival time please call [phone] between 1PM - 3PM on 07/27/22.  Remember: Instructions that are not followed completely may result in serious medical risk, up to and including death, or upon the discretion of your surgeon and anesthesiologist your surgery may need to be rescheduled.     _X__ 1. Do not eat food after midnight the night before your procedure.                 No gum chewing or hard candies. You may drink clear liquids up to 2 hours                 before you are scheduled to arrive for your surgery- DO not drink clear                 liquids within 2 hours of the start of your surgery.                 Clear Liquids include:  water, apple juice without pulp, clear carbohydrate                 drink such as Clearfast or Gatorade, Black Coffee or Tea (Do not add                 anything to coffee or tea). Diabetics water only  __X__2.  On the morning of surgery brush your teeth with toothpaste and water, you                 may rinse your mouth with mouthwash if you wish.  Do not swallow any              toothpaste of mouthwash.     _X__ 3.  No Alcohol for 24 hours before or after surgery.   _X__ 4.  Do Not Smoke or use e-cigarettes For 24 Hours Prior to Your Surgery.                 Do not use any chewable tobacco products for at least 6 hours prior to                 surgery.  ____  5.  Bring all medications with you on the day of surgery if instructed.   __X__  6.  Notify your doctor if there is any change in your medical condition      (cold, fever, infections).     Do not wear jewelry, make-up, hairpins, clips or nail polish. Do not wear lotions, powders, or perfumes. No deodorant Do not shave body hair 48 hours prior to surgery. Men may shave face and neck. Do not bring valuables to the hospital.    [facility] is not  responsible for any belongings or valuables.  Contacts, dentures/partials or body piercings may not be worn into surgery. Bring a case for your contacts, glasses or hearing aids, a denture cup will be supplied. Leave your suitcase in the car. After surgery it may be brought to your room. For patients admitted to the hospital, discharge time is determined by your treatment team.   Patients discharged the day of surgery will not be allowed to drive home.   Please read over the following fact sheets that you were given:   MRSA Information, CHG soap  __X__ Take these medicines the morning of surgery with A  SIP OF WATER:    1. none  2.   3.   4.  5.  6.  ____ Fleet Enema (as directed)   __X__ Use CHG Soap/SAGE wipes as directed  ____ Use inhalers on the day of surgery  ____ Stop metformin/Janumet/Farxiga 2 days prior to surgery    ____ Take 1/2 of usual insulin dose the night before surgery. No insulin the morning          of surgery.   ____ Stop Blood Thinners Coumadin/Plavix/Xarelto/Pleta/Pradaxa/Eliquis/Effient/Aspirin  on   Or contact your Surgeon, Cardiologist or Medical Doctor regarding  ability to stop your blood thinners  __X__ Stop Anti-inflammatories 7 days before surgery such as Advil, Ibuprofen, Motrin,  BC or Goodies Powder, Naprosyn, Naproxen, Aleve  __X__ Stop all herbals and supplements, fish oil or vitamins  until after surgery.    ____ Bring C-Pap to the hospital.

## 2022-07-25 DIAGNOSIS — R260 Ataxic gait: Secondary | ICD-10-CM | POA: Diagnosis not present

## 2022-07-25 DIAGNOSIS — M5416 Radiculopathy, lumbar region: Secondary | ICD-10-CM | POA: Diagnosis not present

## 2022-07-25 DIAGNOSIS — R2681 Unsteadiness on feet: Secondary | ICD-10-CM | POA: Diagnosis not present

## 2022-07-26 ENCOUNTER — Encounter: Payer: Self-pay | Admitting: Nurse Practitioner

## 2022-07-26 ENCOUNTER — Ambulatory Visit (INDEPENDENT_AMBULATORY_CARE_PROVIDER_SITE_OTHER): Payer: 59 | Admitting: Nurse Practitioner

## 2022-07-26 VITALS — BP 118/79 | HR 75 | Temp 97.6°F | Wt 154.5 lb

## 2022-07-26 DIAGNOSIS — E538 Deficiency of other specified B group vitamins: Secondary | ICD-10-CM | POA: Diagnosis not present

## 2022-07-26 DIAGNOSIS — I10 Essential (primary) hypertension: Secondary | ICD-10-CM | POA: Diagnosis not present

## 2022-07-26 MED ORDER — LISINOPRIL 10 MG PO TABS: 10.0000 mg | ORAL_TABLET | Freq: Every day | ORAL | 1 refills | Status: DC

## 2022-07-26 MED ORDER — CYANOCOBALAMIN 1000 MCG/ML IJ SOLN
1000.0000 ug | INTRAMUSCULAR | Status: AC
  Administered 2022-07-26 – 2022-09-27 (×2): 1000 ug via INTRAMUSCULAR

## 2022-07-26 NOTE — Assessment & Plan Note (Signed)
Chronic. Improved with Lisinopril '10mg'$ .  Some blood pressure readings are elevated at home but mostly improved.  He reports a couple of incidents of hypotension at home.  Patient is having surgery on his neck on Monday.  Will likely increase Lisinopril to '20mg'$  at next visit.  Will wait until then due to possible hypotension and decrease risk of falls after surgery.  Labs ordered today.  Follow up in 2  months.  Call sooner if concerns arise.

## 2022-07-26 NOTE — Progress Notes (Signed)
 BP 118/79 (BP Location: Right Arm, Cuff Size: Normal)   Pulse 75   Temp 97.6 F (36.4 C) (Oral)   Wt 154 lb 8 oz (70.1 kg)   SpO2 98%   BMI 24.20 kg/m    Subjective:    Patient ID: [name], male    DOB: 08/10/1961, 61 y.o.   MRN: [id number]  HPI: [name] is a 61 y.o. male  Chief Complaint  Patient presents with   Hypertension    1 month f/up   HYPERTENSION without Chronic Kidney Disease Hypertension status: controlled  Satisfied with current treatment? yes Duration of hypertension: years BP monitoring frequency:  daily BP range:  BP medication side effects:  no Medication compliance: excellent compliance Previous BP meds:lisinopril Aspirin: no Recurrent headaches: no Visual changes: no Palpitations: no Dyspnea: no Chest pain: no Lower extremity edema: no Dizzy/lightheaded: no  Relevant past medical, surgical, family and social history reviewed and updated as indicated. Interim medical history since our last visit reviewed. Allergies and medications reviewed and updated.  Review of Systems  Eyes:  Negative for visual disturbance.  Respiratory:  Negative for shortness of breath.   Cardiovascular:  Negative for chest pain and leg swelling.  Neurological:  Negative for light-headedness and headaches.    Per HPI unless specifically indicated above     Objective:    BP 118/79 (BP Location: Right Arm, Cuff Size: Normal)   Pulse 75   Temp 97.6 F (36.4 C) (Oral)   Wt 154 lb 8 oz (70.1 kg)   SpO2 98%   BMI 24.20 kg/m   Wt Readings from Last 3 Encounters:  07/26/22 154 lb 8 oz (70.1 kg)  07/23/22 155 lb (70.3 kg)  06/25/22 151 lb (68.5 kg)    Physical Exam Vitals and nursing note reviewed.  Constitutional:      General: He is not in acute distress.    Appearance: Normal appearance. He is not ill-appearing, toxic-appearing or diaphoretic.  HENT:     Head: Normocephalic.     Right Ear: External ear normal.     Left Ear: External  ear normal.     Nose: Nose normal. No congestion or rhinorrhea.     Mouth/Throat:     Mouth: Mucous membranes are moist.  Eyes:     General:        Right eye: No discharge.        Left eye: No discharge.     Extraocular Movements: Extraocular movements intact.     Conjunctiva/sclera: Conjunctivae normal.     Pupils: Pupils are equal, round, and reactive to light.  Cardiovascular:     Rate and Rhythm: Normal rate and regular rhythm.     Heart sounds: No murmur heard. Pulmonary:     Effort: Pulmonary effort is normal. No respiratory distress.     Breath sounds: Normal breath sounds. No wheezing, rhonchi or rales.  Abdominal:     General: Abdomen is flat. Bowel sounds are normal.  Musculoskeletal:     Cervical back: Normal range of motion and neck supple.  Skin:    General: Skin is warm and dry.     Capillary Refill: Capillary refill takes less than 2 seconds.  Neurological:     General: No focal deficit present.     Mental Status: He is alert and oriented to person, place, and time.  Psychiatric:        Mood and Affect: Mood normal.          Behavior: Behavior normal.        Thought Content: Thought content normal.        Judgment: Judgment normal.     Results for orders placed or performed during the hospital encounter of 07/23/22  Surgical pcr screen   Specimen: Nasal Mucosa; Nasal Swab  Result Value Ref Range   MRSA, PCR NEGATIVE NEGATIVE   Staphylococcus aureus NEGATIVE NEGATIVE  Urinalysis, Routine w reflex microscopic  Result Value Ref Range   Color, Urine YELLOW (A) YELLOW   APPearance CLEAR (A) CLEAR   Specific Gravity, Urine 1.009 1.005 - 1.030   pH 7.0 5.0 - 8.0   Glucose, UA NEGATIVE NEGATIVE mg/dL   Hgb urine dipstick NEGATIVE NEGATIVE   Bilirubin Urine NEGATIVE NEGATIVE   Ketones, ur 5 (A) NEGATIVE mg/dL   Protein, ur NEGATIVE NEGATIVE mg/dL   Nitrite NEGATIVE NEGATIVE   Leukocytes,Ua NEGATIVE NEGATIVE  Type and screen [facility]   Result Value Ref Range   ABO/RH(D) O POS    Antibody Screen NEG    Sample Expiration      07/26/2022,2359 Performed at [facility], [address] [place], [place] [address]       Assessment & Plan:   Problem List Items Addressed This Visit       Cardiovascular and Mediastinum   Essential hypertension - Primary    Chronic. Improved with Lisinopril 46m.  Some blood pressure readings are elevated at home but mostly improved.  He reports a couple of incidents of hypotension at home.  Patient is having surgery on his neck on Monday.  Will likely increase Lisinopril to 262mat next visit.  Will wait until then due to possible hypotension and decrease risk of falls after surgery.  Labs ordered today.  Follow up in 2  months.  Call sooner if concerns arise.       Relevant Medications   lisinopril (ZESTRIL) 10 MG tablet   Other Relevant Orders   Comp Met (CMET)     Other   B12 deficiency    Chronic.  Has pernicious anemia.  Received B12 injections weekly for 4 weeks then changed to monthly.  Will check labs today to see if he is maintaining B12 levels.  If so, will continue monthly and recheck at next visit.  If levels drop will resume weekly injections.  Follow up in 2 months.  Call sooner if concerns arise.       Relevant Medications   cyanocobalamin (VITAMIN B12) injection 1,000 mcg   Other Relevant Orders   B12     Follow up plan: Return in about 2 months (around 09/26/2022) for BP Check.   A total of 30 minutes were spent on this encounter today.  When total time is documented, this includes both the face-to-face and non-face-to-face time personally spent before, during and after the visit on the date of the encounter symptoms, surgery, plan of care and labs.

## 2022-07-26 NOTE — Assessment & Plan Note (Signed)
Chronic.  Has pernicious anemia.  Received B12 injections weekly for 4 weeks then changed to monthly.  Will check labs today to see if he is maintaining B12 levels.  If so, will continue monthly and recheck at next visit.  If levels drop will resume weekly injections.  Follow up in 2 months.  Call sooner if concerns arise.

## 2022-07-27 DIAGNOSIS — M5416 Radiculopathy, lumbar region: Secondary | ICD-10-CM | POA: Diagnosis not present

## 2022-07-27 DIAGNOSIS — R2681 Unsteadiness on feet: Secondary | ICD-10-CM | POA: Diagnosis not present

## 2022-07-27 DIAGNOSIS — R260 Ataxic gait: Secondary | ICD-10-CM | POA: Diagnosis not present

## 2022-07-27 LAB — COMPREHENSIVE METABOLIC PANEL
ALT: 24 IU/L (ref 0–44)
AST: 34 IU/L (ref 0–40)
Albumin/Globulin Ratio: 2.2 (ref 1.2–2.2)
Albumin: 4.7 g/dL (ref 3.9–4.9)
Alkaline Phosphatase: 76 IU/L (ref 44–121)
BUN/Creatinine Ratio: 13 (ref 10–24)
BUN: 10 mg/dL (ref 8–27)
Bilirubin Total: 0.7 mg/dL (ref 0.0–1.2)
CO2: 22 mmol/L (ref 20–29)
Calcium: 9.2 mg/dL (ref 8.6–10.2)
Chloride: 104 mmol/L (ref 96–106)
Creatinine, Ser: 0.75 mg/dL — ABNORMAL LOW (ref 0.76–1.27)
Globulin, Total: 2.1 g/dL (ref 1.5–4.5)
Glucose: 99 mg/dL (ref 70–99)
Potassium: 4.3 mmol/L (ref 3.5–5.2)
Sodium: 141 mmol/L (ref 134–144)
Total Protein: 6.8 g/dL (ref 6.0–8.5)
eGFR: 102 mL/min/{1.73_m2} (ref >59)

## 2022-07-27 LAB — TYPE AND SCREEN
ABO/RH(D): O POS
Antibody Screen: NEGATIVE

## 2022-07-27 LAB — VITAMIN B12: Vitamin B-12: >2000 pg/mL — ABNORMAL HIGH (ref 232–1245)

## 2022-07-27 NOTE — Progress Notes (Signed)
Please let patient know that his vitamin D is above normal.  We will continue with monthly injections at this time.  We will check it at his next visit to make sure he is maintaining his levels.  Other lab work looks good.  His kidneys are tolerating the blood pressure medication well.

## 2022-07-29 MED ORDER — ORAL CARE MOUTH RINSE: 15.0000 mL | Freq: Once | OROMUCOSAL | Status: AC

## 2022-07-29 MED ORDER — CHLORHEXIDINE GLUCONATE 0.12 % MT SOLN: 15.0000 mL | Freq: Once | OROMUCOSAL | Status: AC

## 2022-07-29 MED ORDER — FAMOTIDINE 20 MG PO TABS: 20.0000 mg | ORAL_TABLET | Freq: Once | ORAL | Status: AC

## 2022-07-29 MED ORDER — CEFAZOLIN IN SODIUM CHLORIDE 2-0.9 GM/100ML-% IV SOLN
2.0000 g | Freq: Once | INTRAVENOUS | Status: DC
  Filled 2022-07-29: qty 100

## 2022-07-29 MED ORDER — LACTATED RINGERS IV SOLN: INTRAVENOUS | Status: DC

## 2022-07-29 MED ORDER — CEFAZOLIN SODIUM-DEXTROSE 2-4 GM/100ML-% IV SOLN
2.0000 g | INTRAVENOUS | Status: AC
  Administered 2022-07-30: 2 g via INTRAVENOUS

## 2022-07-30 ENCOUNTER — Inpatient Hospital Stay: Payer: 59

## 2022-07-30 ENCOUNTER — Encounter: Payer: Self-pay | Admitting: Neurosurgery

## 2022-07-30 ENCOUNTER — Inpatient Hospital Stay
Admission: RE | Admit: 2022-07-30 | Discharge: 2022-07-31 | DRG: 472 | Disposition: A | Discharge status: Home Health | Payer: 59 | Attending: Neurosurgery | Admitting: Neurosurgery

## 2022-07-30 ENCOUNTER — Encounter
Admission: RE | Disposition: A | Discharge status: Home Health | Payer: Self-pay | Source: Home / Self Care | Attending: Neurosurgery

## 2022-07-30 ENCOUNTER — Inpatient Hospital Stay: Payer: 59 | Admitting: Urgent Care

## 2022-07-30 ENCOUNTER — Other Ambulatory Visit: Payer: Self-pay

## 2022-07-30 ENCOUNTER — Inpatient Hospital Stay: Payer: 59 | Admitting: Anesthesiology

## 2022-07-30 DIAGNOSIS — G959 Disease of spinal cord, unspecified: Secondary | ICD-10-CM

## 2022-07-30 DIAGNOSIS — I341 Nonrheumatic mitral (valve) prolapse: Secondary | ICD-10-CM | POA: Diagnosis not present

## 2022-07-30 DIAGNOSIS — M96840 Postprocedural hematoma of a musculoskeletal structure following a musculoskeletal system procedure: Secondary | ICD-10-CM | POA: Diagnosis not present

## 2022-07-30 DIAGNOSIS — M4802 Spinal stenosis, cervical region: Secondary | ICD-10-CM

## 2022-07-30 DIAGNOSIS — R0602 Shortness of breath: Secondary | ICD-10-CM | POA: Diagnosis not present

## 2022-07-30 DIAGNOSIS — I10 Essential (primary) hypertension: Secondary | ICD-10-CM | POA: Diagnosis not present

## 2022-07-30 DIAGNOSIS — R296 Repeated falls: Secondary | ICD-10-CM | POA: Diagnosis not present

## 2022-07-30 DIAGNOSIS — Z9181 History of falling: Secondary | ICD-10-CM | POA: Diagnosis not present

## 2022-07-30 DIAGNOSIS — M47812 Spondylosis without myelopathy or radiculopathy, cervical region: Secondary | ICD-10-CM | POA: Diagnosis not present

## 2022-07-30 DIAGNOSIS — Z8249 Family history of ischemic heart disease and other diseases of the circulatory system: Secondary | ICD-10-CM

## 2022-07-30 DIAGNOSIS — M5412 Radiculopathy, cervical region: Secondary | ICD-10-CM | POA: Diagnosis not present

## 2022-07-30 DIAGNOSIS — Z823 Family history of stroke: Secondary | ICD-10-CM | POA: Diagnosis not present

## 2022-07-30 DIAGNOSIS — M1990 Unspecified osteoarthritis, unspecified site: Secondary | ICD-10-CM | POA: Diagnosis not present

## 2022-07-30 DIAGNOSIS — Z79899 Other long term (current) drug therapy: Secondary | ICD-10-CM | POA: Diagnosis not present

## 2022-07-30 DIAGNOSIS — F909 Attention-deficit hyperactivity disorder, unspecified type: Secondary | ICD-10-CM | POA: Diagnosis present

## 2022-07-30 DIAGNOSIS — M4712 Other spondylosis with myelopathy, cervical region: Principal | ICD-10-CM | POA: Diagnosis present

## 2022-07-30 DIAGNOSIS — R1310 Dysphagia, unspecified: Secondary | ICD-10-CM | POA: Diagnosis not present

## 2022-07-30 DIAGNOSIS — D510 Vitamin B12 deficiency anemia due to intrinsic factor deficiency: Secondary | ICD-10-CM | POA: Diagnosis present

## 2022-07-30 DIAGNOSIS — M4722 Other spondylosis with radiculopathy, cervical region: Secondary | ICD-10-CM | POA: Diagnosis present

## 2022-07-30 DIAGNOSIS — R221 Localized swelling, mass and lump, neck: Secondary | ICD-10-CM | POA: Diagnosis not present

## 2022-07-30 DIAGNOSIS — M542 Cervicalgia: Secondary | ICD-10-CM | POA: Diagnosis not present

## 2022-07-30 DIAGNOSIS — Z8616 Personal history of COVID-19: Secondary | ICD-10-CM | POA: Diagnosis not present

## 2022-07-30 DIAGNOSIS — Z807 Family history of other malignant neoplasms of lymphoid, hematopoietic and related tissues: Secondary | ICD-10-CM | POA: Diagnosis not present

## 2022-07-30 DIAGNOSIS — G9589 Other specified diseases of spinal cord: Secondary | ICD-10-CM | POA: Diagnosis not present

## 2022-07-30 DIAGNOSIS — Z9889 Other specified postprocedural states: Secondary | ICD-10-CM | POA: Diagnosis not present

## 2022-07-30 DIAGNOSIS — Z981 Arthrodesis status: Secondary | ICD-10-CM | POA: Diagnosis not present

## 2022-07-30 DIAGNOSIS — Z01818 Encounter for other preprocedural examination: Secondary | ICD-10-CM

## 2022-07-30 HISTORY — PX: ANTERIOR CERVICAL DECOMP/DISCECTOMY FUSION: SHX1161

## 2022-07-30 LAB — ABO/RH: ABO/RH(D): O POS

## 2022-07-30 SURGERY — ANTERIOR CERVICAL DECOMPRESSION/DISCECTOMY FUSION 3 LEVELS
Anesthesia: General

## 2022-07-30 MED ORDER — PROPOFOL 500 MG/50ML IV EMUL
INTRAVENOUS | Status: DC | PRN
  Administered 2022-07-30: 80 ug/kg/min via INTRAVENOUS

## 2022-07-30 MED ORDER — SUCCINYLCHOLINE CHLORIDE 200 MG/10ML IV SOSY
PREFILLED_SYRINGE | INTRAVENOUS | Status: DC | PRN
  Administered 2022-07-30: 100 mg via INTRAVENOUS

## 2022-07-30 MED ORDER — PROPOFOL 10 MG/ML IV BOLUS
INTRAVENOUS | Status: DC | PRN
  Administered 2022-07-30: 120 mg via INTRAVENOUS
  Administered 2022-07-30: 50 mg via INTRAVENOUS

## 2022-07-30 MED ORDER — PHENYLEPHRINE HCL-NACL 20-0.9 MG/250ML-% IV SOLN
INTRAVENOUS | Status: AC
  Filled 2022-07-30: qty 250

## 2022-07-30 MED ORDER — METHOCARBAMOL 500 MG PO TABS
500.0000 mg | ORAL_TABLET | Freq: Four times a day (QID) | ORAL | Status: DC | PRN
  Administered 2022-07-30 (×2): 500 mg via ORAL
  Filled 2022-07-30: qty 1

## 2022-07-30 MED ORDER — 0.9 % SODIUM CHLORIDE (POUR BTL) OPTIME
TOPICAL | Status: DC | PRN
  Administered 2022-07-30: 1000 mL

## 2022-07-30 MED ORDER — PHENOL 1.4 % MT LIQD: 1.0000 | OROMUCOSAL | Status: DC | PRN

## 2022-07-30 MED ORDER — DEXAMETHASONE SODIUM PHOSPHATE 10 MG/ML IJ SOLN
INTRAMUSCULAR | Status: DC | PRN
  Administered 2022-07-30: 10 mg via INTRAVENOUS

## 2022-07-30 MED ORDER — ONDANSETRON HCL 4 MG/2ML IJ SOLN: 4.0000 mg | Freq: Four times a day (QID) | INTRAMUSCULAR | Status: DC | PRN

## 2022-07-30 MED ORDER — SUMATRIPTAN SUCCINATE 50 MG PO TABS: 100.0000 mg | ORAL_TABLET | ORAL | Status: DC | PRN

## 2022-07-30 MED ORDER — LIDOCAINE HCL (PF) 2 % IJ SOLN
INTRAMUSCULAR | Status: AC
  Filled 2022-07-30: qty 5

## 2022-07-30 MED ORDER — METHOCARBAMOL 1000 MG/10ML IJ SOLN: 500.0000 mg | Freq: Four times a day (QID) | INTRAVENOUS | Status: DC | PRN

## 2022-07-30 MED ORDER — OXYCODONE HCL 5 MG PO TABS
10.0000 mg | ORAL_TABLET | ORAL | Status: DC | PRN
  Administered 2022-07-30: 10 mg via ORAL
  Filled 2022-07-30: qty 2

## 2022-07-30 MED ORDER — ADULT MULTIVITAMIN W/MINERALS CH
1.0000 | ORAL_TABLET | Freq: Every day | ORAL | Status: DC
  Administered 2022-07-30 – 2022-07-31 (×2): 1 via ORAL
  Filled 2022-07-30 (×2): qty 1

## 2022-07-30 MED ORDER — ONDANSETRON HCL 4 MG/2ML IJ SOLN
INTRAMUSCULAR | Status: AC
  Filled 2022-07-30: qty 2

## 2022-07-30 MED ORDER — CHLORHEXIDINE GLUCONATE 0.12 % MT SOLN
OROMUCOSAL | Status: AC
  Administered 2022-07-30: 15 mL via OROMUCOSAL
  Filled 2022-07-30: qty 15

## 2022-07-30 MED ORDER — DOCUSATE SODIUM 100 MG PO CAPS
100.0000 mg | ORAL_CAPSULE | Freq: Two times a day (BID) | ORAL | Status: DC
  Administered 2022-07-30 – 2022-07-31 (×2): 100 mg via ORAL
  Filled 2022-07-30 (×2): qty 1

## 2022-07-30 MED ORDER — DEXAMETHASONE SODIUM PHOSPHATE 10 MG/ML IJ SOLN
INTRAMUSCULAR | Status: AC
  Filled 2022-07-30: qty 1

## 2022-07-30 MED ORDER — LIDOCAINE HCL (CARDIAC) PF 100 MG/5ML IV SOSY
PREFILLED_SYRINGE | INTRAVENOUS | Status: DC | PRN
  Administered 2022-07-30: 80 mg via INTRATRACHEAL

## 2022-07-30 MED ORDER — SODIUM CHLORIDE 0.9 % IV SOLN: 250.0000 mL | INTRAVENOUS | Status: DC

## 2022-07-30 MED ORDER — KETOROLAC TROMETHAMINE 15 MG/ML IJ SOLN
15.0000 mg | Freq: Four times a day (QID) | INTRAMUSCULAR | Status: DC
  Administered 2022-07-30 – 2022-07-31 (×3): 15 mg via INTRAVENOUS
  Filled 2022-07-30 (×3): qty 1

## 2022-07-30 MED ORDER — CEFAZOLIN SODIUM-DEXTROSE 2-4 GM/100ML-% IV SOLN
INTRAVENOUS | Status: AC
  Filled 2022-07-30: qty 100

## 2022-07-30 MED ORDER — VITAMIN D 25 MCG (1000 UNIT) PO TABS
5000.0000 [IU] | ORAL_TABLET | Freq: Every day | ORAL | Status: DC
  Administered 2022-07-30 – 2022-07-31 (×2): 5000 [IU] via ORAL
  Filled 2022-07-30 (×2): qty 5

## 2022-07-30 MED ORDER — PROPOFOL 1000 MG/100ML IV EMUL
INTRAVENOUS | Status: AC
  Filled 2022-07-30: qty 100

## 2022-07-30 MED ORDER — ACETAMINOPHEN 500 MG PO TABS
1000.0000 mg | ORAL_TABLET | Freq: Four times a day (QID) | ORAL | Status: DC
  Administered 2022-07-30 – 2022-07-31 (×3): 1000 mg via ORAL
  Filled 2022-07-30 (×3): qty 2

## 2022-07-30 MED ORDER — ENOXAPARIN SODIUM 40 MG/0.4ML IJ SOSY
40.0000 mg | PREFILLED_SYRINGE | INTRAMUSCULAR | Status: DC
  Administered 2022-07-31: 40 mg via SUBCUTANEOUS
  Filled 2022-07-30: qty 0.4

## 2022-07-30 MED ORDER — PHENYLEPHRINE HCL-NACL 20-0.9 MG/250ML-% IV SOLN
INTRAVENOUS | Status: DC | PRN
  Administered 2022-07-30: 30 ug/min via INTRAVENOUS

## 2022-07-30 MED ORDER — ONDANSETRON HCL 4 MG PO TABS: 4.0000 mg | ORAL_TABLET | Freq: Four times a day (QID) | ORAL | Status: DC | PRN

## 2022-07-30 MED ORDER — BUPIVACAINE-EPINEPHRINE (PF) 0.5% -1:200000 IJ SOLN
INTRAMUSCULAR | Status: AC
  Filled 2022-07-30: qty 30

## 2022-07-30 MED ORDER — FENTANYL CITRATE (PF) 100 MCG/2ML IJ SOLN
INTRAMUSCULAR | Status: AC
  Filled 2022-07-30: qty 2

## 2022-07-30 MED ORDER — ONDANSETRON HCL 4 MG/2ML IJ SOLN: 4.0000 mg | Freq: Once | INTRAMUSCULAR | Status: DC | PRN

## 2022-07-30 MED ORDER — OXYCODONE HCL 5 MG PO TABS
ORAL_TABLET | ORAL | Status: AC
  Administered 2022-07-30: 10 mg via ORAL
  Filled 2022-07-30: qty 2

## 2022-07-30 MED ORDER — KETOROLAC TROMETHAMINE 30 MG/ML IJ SOLN
INTRAMUSCULAR | Status: DC | PRN
  Administered 2022-07-30: 30 mg via INTRAVENOUS

## 2022-07-30 MED ORDER — SODIUM CHLORIDE 0.9 % IV SOLN
INTRAVENOUS | Status: DC | PRN
  Administered 2022-07-30: .1 ug/kg/min via INTRAVENOUS

## 2022-07-30 MED ORDER — MAGNESIUM CITRATE PO SOLN: 1.0000 | Freq: Once | ORAL | Status: DC | PRN

## 2022-07-30 MED ORDER — ONDANSETRON HCL 4 MG/2ML IJ SOLN
INTRAMUSCULAR | Status: DC | PRN
  Administered 2022-07-30: 4 mg via INTRAVENOUS

## 2022-07-30 MED ORDER — FENTANYL CITRATE (PF) 100 MCG/2ML IJ SOLN
INTRAMUSCULAR | Status: AC
  Administered 2022-07-30: 25 ug via INTRAVENOUS
  Filled 2022-07-30: qty 2

## 2022-07-30 MED ORDER — MIDAZOLAM HCL 2 MG/2ML IJ SOLN
INTRAMUSCULAR | Status: DC | PRN
  Administered 2022-07-30: 2 mg via INTRAVENOUS

## 2022-07-30 MED ORDER — METHOCARBAMOL 500 MG PO TABS
ORAL_TABLET | ORAL | Status: AC
  Filled 2022-07-30: qty 1

## 2022-07-30 MED ORDER — ALPHA LIPOIC ACID 200 MG PO CAPS: 600.0000 mg | ORAL_CAPSULE | Freq: Every day | ORAL | Status: DC

## 2022-07-30 MED ORDER — MENTHOL 3 MG MT LOZG: 1.0000 | LOZENGE | OROMUCOSAL | Status: DC | PRN

## 2022-07-30 MED ORDER — MORPHINE SULFATE (PF) 2 MG/ML IV SOLN: 2.0000 mg | INTRAVENOUS | Status: DC | PRN

## 2022-07-30 MED ORDER — SUCCINYLCHOLINE CHLORIDE 200 MG/10ML IV SOSY
PREFILLED_SYRINGE | INTRAVENOUS | Status: AC
  Filled 2022-07-30: qty 10

## 2022-07-30 MED ORDER — SENNA 8.6 MG PO TABS
1.0000 | ORAL_TABLET | Freq: Two times a day (BID) | ORAL | Status: DC
  Administered 2022-07-30 – 2022-07-31 (×2): 8.6 mg via ORAL
  Filled 2022-07-30 (×2): qty 1

## 2022-07-30 MED ORDER — POLYETHYLENE GLYCOL 3350 17 G PO PACK: 17.0000 g | PACK | Freq: Every day | ORAL | Status: DC | PRN

## 2022-07-30 MED ORDER — MIDAZOLAM HCL 2 MG/2ML IJ SOLN
INTRAMUSCULAR | Status: AC
  Filled 2022-07-30: qty 2

## 2022-07-30 MED ORDER — LISINOPRIL 10 MG PO TABS
10.0000 mg | ORAL_TABLET | Freq: Every day | ORAL | Status: DC
  Administered 2022-07-30 – 2022-07-31 (×2): 10 mg via ORAL
  Filled 2022-07-30 (×2): qty 1

## 2022-07-30 MED ORDER — SURGIFLO WITH THROMBIN (HEMOSTATIC MATRIX KIT) OPTIME
TOPICAL | Status: DC | PRN
  Administered 2022-07-30: 1 via TOPICAL

## 2022-07-30 MED ORDER — SODIUM CHLORIDE 0.9% FLUSH: 3.0000 mL | INTRAVENOUS | Status: DC | PRN

## 2022-07-30 MED ORDER — FENTANYL CITRATE (PF) 100 MCG/2ML IJ SOLN
INTRAMUSCULAR | Status: DC | PRN
  Administered 2022-07-30 (×2): 50 ug via INTRAVENOUS

## 2022-07-30 MED ORDER — BISACODYL 10 MG RE SUPP: 10.0000 mg | Freq: Every day | RECTAL | Status: DC | PRN

## 2022-07-30 MED ORDER — OXYCODONE HCL 5 MG PO TABS: 5.0000 mg | ORAL_TABLET | ORAL | Status: DC | PRN

## 2022-07-30 MED ORDER — REMIFENTANIL HCL 1 MG IV SOLR
INTRAVENOUS | Status: AC
  Filled 2022-07-30: qty 1000

## 2022-07-30 MED ORDER — SODIUM CHLORIDE 0.9% FLUSH
3.0000 mL | Freq: Two times a day (BID) | INTRAVENOUS | Status: DC
  Administered 2022-07-30 – 2022-07-31 (×2): 3 mL via INTRAVENOUS

## 2022-07-30 MED ORDER — FENTANYL CITRATE (PF) 100 MCG/2ML IJ SOLN
25.0000 ug | INTRAMUSCULAR | Status: DC | PRN
  Administered 2022-07-30 (×3): 25 ug via INTRAVENOUS

## 2022-07-30 MED ORDER — SODIUM CHLORIDE 0.9 % IV SOLN: INTRAVENOUS | Status: DC

## 2022-07-30 MED ORDER — BUPIVACAINE-EPINEPHRINE 0.5% -1:200000 IJ SOLN
INTRAMUSCULAR | Status: DC | PRN
  Administered 2022-07-30: 6 mL

## 2022-07-30 MED ORDER — FAMOTIDINE 20 MG PO TABS
ORAL_TABLET | ORAL | Status: AC
  Administered 2022-07-30: 20 mg via ORAL
  Filled 2022-07-30: qty 1

## 2022-07-30 SURGICAL SUPPLY — 51 items
ADH SKN CLS APL DERMABOND .7 (GAUZE/BANDAGES/DRESSINGS) ×1
AGENT HMST KT MTR STRL THRMB (HEMOSTASIS) ×1
BASIN KIT SINGLE STR (MISCELLANEOUS) ×1 IMPLANT
BULB RESERV EVAC DRAIN JP 100C (MISCELLANEOUS) IMPLANT
BUR NEURO DRILL SOFT 3.0X3.8M (BURR) ×1 IMPLANT
DERMABOND ADVANCED .7 DNX12 (GAUZE/BANDAGES/DRESSINGS) ×1 IMPLANT
DRAIN CHANNEL JP 10F RND 20C F (MISCELLANEOUS) IMPLANT
DRAPE C ARM PK CFD 31 SPINE (DRAPES) ×1 IMPLANT
DRAPE LAPAROTOMY 77X122 PED (DRAPES) ×1 IMPLANT
DRAPE MICROSCOPE SPINE 48X150 (DRAPES) ×1 IMPLANT
DRAPE SURG 17X11 SM STRL (DRAPES) ×1 IMPLANT
DRSG TEGADERM 4X4.75 (GAUZE/BANDAGES/DRESSINGS) IMPLANT
FEE INTRAOP CADWELL SUPPLY NCS (MISCELLANEOUS) IMPLANT
FEE INTRAOP MONITOR IMPULS NCS (MISCELLANEOUS) IMPLANT
GLOVE BIOGEL PI IND STRL 6.5 (GLOVE) ×1 IMPLANT
GLOVE BIOGEL PI IND STRL 8.5 (GLOVE) ×1 IMPLANT
GLOVE SURG SYN 6.5 ES PF (GLOVE) ×2 IMPLANT
GLOVE SURG SYN 6.5 PF PI (GLOVE) ×2 IMPLANT
GLOVE SURG SYN 8.5  E (GLOVE) ×3
GLOVE SURG SYN 8.5 E (GLOVE) ×3 IMPLANT
GLOVE SURG SYN 8.5 PF PI (GLOVE) ×3 IMPLANT
GOWN SRG LRG LVL 4 IMPRV REINF (GOWNS) ×2 IMPLANT
GOWN SRG XL LVL 3 NONREINFORCE (GOWNS) ×1 IMPLANT
GOWN STRL NON-REIN TWL XL LVL3 (GOWNS) ×1
GOWN STRL REIN LRG LVL4 (GOWNS) ×2
INTRAOP CADWELL SUPPLY FEE NCS (MISCELLANEOUS) ×1
INTRAOP DISP SUPPLY FEE NCS (MISCELLANEOUS) ×1
INTRAOP MONITOR FEE IMPULS NCS (MISCELLANEOUS) ×1
INTRAOP MONITOR FEE IMPULSE (MISCELLANEOUS) ×1
KIT TURNOVER KIT A (KITS) ×1 IMPLANT
MANIFOLD NEPTUNE II (INSTRUMENTS) ×1 IMPLANT
NS IRRIG 1000ML POUR BTL (IV SOLUTION) ×1 IMPLANT
PACK LAMINECTOMY NEURO (CUSTOM PROCEDURE TRAY) ×1 IMPLANT
PAD ARMBOARD 7.5X6 YLW CONV (MISCELLANEOUS) ×2 IMPLANT
PIN CASPAR 14 (PIN) ×1 IMPLANT
PIN CASPAR 14MM (PIN) ×1 IMPLANT
PLATE ANT CERV XTEND 3 LV 51 (Plate) IMPLANT
SCREW VAR 4.2 XD SELF DRILL 16 (Screw) IMPLANT
SCREW XTEND SELF DRILL 4.6X16 (Screw) IMPLANT
SPACER CERVICAL FRGE 12X14X7-7 (Spacer) IMPLANT
SPONGE GAUZE 2X2 8PLY STRL LF (GAUZE/BANDAGES/DRESSINGS) IMPLANT
SPONGE KITTNER 5P (MISCELLANEOUS) ×2 IMPLANT
STAPLER SKIN PROX 35W (STAPLE) IMPLANT
SURGIFLO W/THROMBIN 8M KIT (HEMOSTASIS) ×1 IMPLANT
SUT ETHILON 3-0 FS-10 30 BLK (SUTURE) ×1
SUT V-LOC 90 ABS DVC 3-0 CL (SUTURE) ×1 IMPLANT
SUT VIC AB 3-0 SH 8-18 (SUTURE) ×1 IMPLANT
SUTURE EHLN 3-0 FS-10 30 BLK (SUTURE) IMPLANT
SYR 20ML LL LF (SYRINGE) ×1 IMPLANT
TAPE CLOTH 3X10 WHT NS LF (GAUZE/BANDAGES/DRESSINGS) ×3 IMPLANT
TRAP FLUID SMOKE EVACUATOR (MISCELLANEOUS) ×1 IMPLANT

## 2022-07-30 NOTE — Anesthesia Procedure Notes (Signed)
Procedure Name: Intubation Date/Time: 07/30/2022 10:05 AM  Performed by: Jonna Clark, CRNAPre-anesthesia Checklist: Patient identified, Patient being monitored, Timeout performed, Emergency Drugs available and Suction available Patient Re-evaluated:Patient Re-evaluated prior to induction Oxygen Delivery Method: Circle system utilized Preoxygenation: Pre-oxygenation with 100% oxygen Induction Type: IV induction Ventilation: Mask ventilation without difficulty Laryngoscope Size: 4 and McGraph Grade View: Grade I Tube type: Oral Tube size: 7.0 mm Number of attempts: 1 Airway Equipment and Method: Stylet Placement Confirmation: ETT inserted through vocal cords under direct vision, positive ETCO2 and breath sounds checked- equal and bilateral Secured at: 21 cm Tube secured with: Tape Dental Injury: Teeth and Oropharynx as per pre-operative assessment

## 2022-07-30 NOTE — Discharge Instructions (Signed)
Your surgeon has performed an operation on your cervical spine (neck) to relieve pressure on the spinal cord and/or nerves. This involved making an incision in the front of your neck and removing one or more of the discs that support your spine. Next, a small piece of bone, a titanium plate, and screws were used to fuse two or more of the vertebrae (bones) together.  The following are instructions to help in your recovery once you have been discharged from the hospital. Even if you feel well, it is important that you follow these activity guidelines. If you do not let your neck heal properly from the surgery, you can increase the chance of return of your symptoms and other complications.  * Do not take anti-inflammatory medications for 3 months after surgery (naproxen [Aleve], ibuprofen [Advil, Motrin], etc.). These medications can prevent your bones from healing properly.  Celebrex, if prescribed, is ok to take.  Activity    No bending, lifting, or twisting ("BLT"). Avoid lifting objects heavier than 10 pounds (gallon milk jug).  Where possible, avoid household activities that involve lifting, bending, reaching, pushing, or pulling such as laundry, vacuuming, grocery shopping, and childcare. Try to arrange for help from friends and family for these activities while your back heals.  Increase physical activity slowly as tolerated.  Taking short walks is encouraged, but avoid strenuous exercise. Do not jog, run, bicycle, lift weights, or participate in any other exercises unless specifically allowed by your doctor.  Talk to your doctor before resuming sexual activity.  You should not drive until cleared by your doctor.  Until released by your doctor, you should not return to work or school.  You should rest at home and let your body heal.   You may shower three days after your surgery.  After showering, lightly dab your incision dry. Do not take a tub bath or go swimming until approved by your  doctor at your follow-up appointment.  If your doctor ordered a cervical collar (neck brace) for you, you should wear it whenever you are out of bed. You may remove it when lying down or sleeping, but you should wear it at all other times. Not all neck surgeries require a cervical collar.  If you smoke, we strongly recommend that you quit.  Smoking has been proven to interfere with normal bone healing and will dramatically reduce the success rate of your surgery. Please contact [facility] (800-QUIT-NOW) and use the resources at www.[facility].com for assistance in stopping smoking.  Surgical Incision   If you have a dressing on your incision, you may remove it two days after your surgery. Keep your incision area clean and dry.  Your incision was closed with Dermabond glue. The glue should begin to peel away within about a week.  Diet           You may return to your usual diet. However, you may experience discomfort when swallowing in the first month after your surgery. This is normal. You may find that softer foods are more comfortable for you to swallow. Be sure to stay hydrated.  When to Contact us  You may experience pain in your neck and/or pain between your shoulder blades. This is normal and should improve in the next few weeks with the help of pain medication, muscle relaxers, and rest. Some patients report that a warm compress on the back of the neck or between the shoulder blades helps.  However, should you experience any of the following, contact us  immediately: New numbness or weakness Pain that is progressively getting worse, and is not relieved by your pain medication, muscle relaxers, rest, and warm compresses Bleeding, redness, swelling, pain, or drainage from surgical incision Chills or flu-like symptoms Fever greater than 101.0 F (38.3 C) Inability to eat, drink fluids, or take medications Problems with bowel or bladder functions Difficulty breathing or shortness of  breath Warmth, tenderness, or swelling in your calf Contact Information During office hours (Monday-Friday 9 am to 5 pm), please call your physician at [phone] and ask for Berdine Addison After hours and weekends, please call [phone] and speak with the neurosurgeon on call For a life-threatening emergency, call 911

## 2022-07-30 NOTE — Op Note (Signed)
Indications: [name] is a 61 yo male who presented with cervical G95.9 cervical myelopathy, M48.02 cervical stenosis, M54.12 cervical myelopathy.  The patient failed conservative management and elected for surgical intervention.  Findings: extensive spondylosis  Preoperative Diagnosis: G95.9 cervical myelopathy, M48.02 cervical stenosis, M54.12 cervical myelopathy  Postoperative Diagnosis: same   EBL: 50 ml IVF: see AR ml Drains: 1 placed Disposition: Extubated and Stable to PACU Complications: none  No foley catheter was placed.   Preoperative Note:   Risks of surgery discussed include: infection, bleeding, stroke, coma, death, paralysis, CSF leak, nerve/spinal cord injury, numbness, tingling, weakness, complex regional pain syndrome, recurrent stenosis and/or disc herniation, vascular injury, development of instability, neck/back pain, need for further surgery, persistent symptoms, development of deformity, and the risks of anesthesia. The patient understood these risks and agreed to proceed.     Operative Procedure: 1. Anterior Cervical Discectomy and Fusion C4-5 including bilateral foraminotomies and end plate preparation  2. Anterior Cervical Discectomy and Fusion C5-6 including bilateral foraminotomies and end plate preparation  3. Anterior Cervical Discectomy and Fusion C6-7 including bilateral foraminotomies and end plate preparation 4. Anterior Spinal Instrumentation C4 to 7 5. Anterior arthrodesis from C4 to C7 6. Use of the operative microscope 7. Use of intraoperative flouroscopy  PROCEDURE IN DETAIL: After obtaining informed consent, the patient taken to the operating room, placed in supine position, general anesthesia induced.  The patient had a small shoulder roll placed behind their shoulders.  The patient received preop antibiotics and IV Decadron.  The patient had a neck incision outlined, was prepped and draped in usual sterile fashion. The incision was  injected with local anesthetic.   An incision was opened, dissection taken down medial to the carotid artery and jugular vein, lateral to the trachea and esophagus.  The prevertebral fascia identified and a localizing x-ray demonstrated the correct level.  The longus colli were dissected laterally, and self-retaining retractors placed to open the operative field. The microscope was then brought into the field.  With this complete, distractor pins were placed in the vertebral bodies of C4 and C5.  The distractor was placed from C4 to C5. The annulus at C4-5 were opened with a bovie. Curettes and pituitary rongeurs used to remove the majority of disk at each level, then the drill was used to remove the posterior osteophyte and begin the foraminotomies. The nerve hook was used to elevate the posterior longitudinal ligament, which was then removed with Kerrison rongeurs. The microblunt nerve hook could be passed out the foramen bilaterally.   Meticulous hemostasis was obtained. After hemostasis, structural allograft was tapped behind the anterior lip of the vertebral body at C4-5 (7 mm height).     The distractor was then removed, and the C4 caspar pin removed. Bone wax was used for hemostasis. An additional Caspar pin was placed at C7. The distractor was placed from C5-7, and the annuli at C5-6 and C6-7 were opened using a bovie.  Curettes and pituitary rongeurs used to remove the majority of disk, then the drill was used to remove the posterior osteophyte and begin the foraminotomies. The nerve hook was used to elevate the posterior longitudinal ligament, which was then removed with Kerrison rongeurs. The microblunt nerve hook could be passed out the foramen bilaterally at each level.   Meticulous hemostasis was obtained.  Structural allograft was tapped behind the anterior lip of the vertebral body at C5-6 (7 mm height) and C6-7 (7 mm height).  The caspar distractor was removed,  and bone wax used for  hemostasis at each level. The anterior osteophytes were removed.    A separate, four segment, three level plate (51 mm Globus Xtend) was chosen.  Two screws placed in the vertebral bodies of all four segments, respectively making sure the screws were behind the locking mechanism.  Final AP and lateral radiographs were taken.   Please note that the plate is not inclusive to the interbody structural allograft.  The anchoring mechanism of the plate is completely separate from the allograft.  With everything in good position, the wound was irrigated copiously with bacitracin-containing solution and meticulous hemostasis obtained.  Wound was closed in 2 layers using interrupted inverted 3-0 Vicryl sutures in the platysma and 3-0 monocryl in the dermis.  The wound was dressed with dermabond, the head of bed at 30 degrees, taken to recovery room in stable condition.  No new postop neurological deficits were identified.  All counts were correct at the end of the case.   Monitoring was used throughout without any changes.   I performed the entire procedure with Cooper Render PA as an Environmental consultant. An assistant was required for this procedure due to the complexity.  The assistant provided assistance in tissue manipulation and suction, and was required for the successful and safe performance of the procedure. I performed the critical portions of the procedure.   Meade Maw MD

## 2022-07-30 NOTE — H&P (Signed)
Referring Physician:  Meade [name], [name],   [id number]  Primary Physician:  [name] Billings, NP  History of Present Illness: 07/30/2022 [name] is here today with a chief complaint of falls and balance issues as noted below.    06/19/2022 [name] is here today with a chief complaint of balance issues and frequent falls.  He is also suffering from weakness of both legs over the past several months.  His left foot weakness has been ongoing for 18 months.  His right foot has been weak for approximately 5 months.   He and his wife report that he has had some issues with his balance with frequent falls over the past several months.  He is also having worsening trouble with weakness in his hands.  He does not report clear dexterity changes, but has had more trouble with some movements of his hands.  He has had intermittent numbness in his hands and forearms.   He has significant issues in his back with pain down his legs.  Standing vertically, lifting items above his head, and certain postures make it worse.  Popping his back makes it better.  Laying down also makes it better.   Bowel/Bladder Dysfunction: none   Conservative measures: ice Physical therapy:  currently participating - initial evaluation completed last week at [facility] Multimodal medical therapy including regular antiinflammatories:  prednisone, robaxin, topical patches, biofreeze, tylenol Injections: has not received epidural steroid injections   Past Surgery: denies   [name] has symptoms of cervical myelopathy.   The symptoms are causing a significant impact on the patient's life.    Review of Systems:  A 10 point review of systems is negative, except for the pertinent positives and negatives detailed in the HPI.  Past Medical History: Past Medical History:  Diagnosis Date   ADHD    Arthritis    Concussion    COVID 12/2020    Hypertension    Multiple lacunar infarcts (HCC)    MVP (mitral valve prolapse)    Pernicious anemia    [facility] spotted fever    Spinal stenosis    Vestibular migraine    Vitamin B12 deficiency    Vitamin D deficiency     Past Surgical History: Past Surgical History:  Procedure Laterality Date   BILATERAL CARPAL TUNNEL RELEASE     FRACTURE SURGERY Left 1984   intramedullary rod   hip bone transplant  1984   LEG SURGERY Left    oseotomy   REPAIR ANKLE LIGAMENT Right 1978   REPAIR KNEE LIGAMENT Bilateral    acl   TRIGGER FINGER RELEASE Bilateral    thumbs    Allergies: Allergies as of 07/09/2022   (No Known Allergies)    Medications: Current Facility-Administered Medications for the 07/30/22 encounter [facility] Encounter)  Medication   cyanocobalamin (VITAMIN B12) injection 1,000 mcg   Current Meds  Medication Sig   lisinopril (ZESTRIL) 10 MG tablet Take 1 tablet (10 mg total) by mouth daily.    Social History: Social History   Tobacco Use   Smoking status: Never   Smokeless tobacco: Never  Vaping Use   Vaping Use: Never used  Substance Use Topics   Alcohol use: Yes    Alcohol/week: 1.0 standard drink of alcohol    Types: 1 Cans of beer per week   Drug use: No    Family Medical History: Family History  Problem Relation Age of Onset  Breast cancer Mother    Thyroid cancer Mother    Lymphoma Father    CVA Father    Heart attack Sister    Hypertension Sister     Physical Examination: Vitals:   07/30/22 0823  BP: (!) 145/90  Pulse: 71  Resp: 16  Temp: (!) 96.9 F (36.1 C)  SpO2: 98%   Heart sounds normal no MRG. Chest Clear to Auscultation Bilaterally.  General: Patient is well developed, well nourished, calm, collected, and in no apparent distress. Attention to examination is appropriate.  Neck:   Supple.  Full range of motion.  Respiratory: Patient is breathing without any difficulty.   NEUROLOGICAL:     Awake, alert,  oriented to person, place, and time.  Speech is clear and fluent. Fund of knowledge is appropriate.   Cranial Nerves: Pupils equal round and reactive to light.  Facial tone is symmetric.  Facial sensation is symmetric. Shoulder shrug is symmetric. Tongue protrusion is midline.  There is no pronator drift.  ROM of spine: full.    Strength: Side Biceps Triceps Deltoid Interossei Grip Wrist Ext. Wrist Flex.  R '5 5 5 4 4 '$ 4+ 5  L '5 5 5 '$ 4+ 4+ 5 5   Side Iliopsoas Quads Hamstring PF DF EHL  R '5 5 5 '$ 4- 3 4+  L '5 5 5 '$ 4- 3 2   Reflexes are 1+ and symmetric at the biceps, triceps, brachioradialis, patella and achilles.   Hoffman's is present.   Bilateral upper and lower extremity sensation shows diminished sensation below the knees bilaterally.    No evidence of dysmetria noted.  Gait is abnormal and wide-based.     Medical Decision Making  Imaging: MRI C spine 05/24/2022   IMPRESSION: 1. Multilevel cervical spondylosis with resultant moderate to severe spinal stenosis at C4-5 through C6-7, most pronounced at C4-5. Suspected subtle cord signal changes at the level of C4-5, suggesting compressive myelomalacia. 2. Moderate bilateral C4, C5, and C7 foraminal stenosis related to disc osteophyte and facet disease. 3. Multiple remote lacunar infarcts about the visualized pons/brainstem.     Electronically Signed   By: Jeannine Boga M.D.   On: 05/24/2022 04:54   MRI T spine 05/24/2022 IMPRESSION: 1. Multilevel degenerative spondylosis as detailed above. No significant spinal stenosis or evidence for cord impingement. Normal MRI appearance of the thoracic spinal cord. 2. Moderate bilateral foraminal narrowing at the T1-2 level related to disc bulge and endplate spurring. No other significant foraminal encroachment within the thoracic spine. 3. Mild chronic compression deformity at the superior endplate of L1 without retropulsion.     Electronically Signed   By: Jeannine Boga M.D.   On: 05/24/2022 05:07    I have personally reviewed the images and agree with the above interpretation.  Assessment and Plan: [name] is a pleasant 61 y.o. male with cervical stenosis from C4-C7.  He has myelomalacia at C4-5.  He has severe neuroforaminal stenosis at multiple levels including C6-7.  He has symptoms of clinical cervical myelopathy including increasing weakness in his hands as well as trouble with his balance.  He has weakness in his lower limbs as well, which could be the result of the cervical stenosis though could also be multifactorial.   Given his worsening weakness in his arms and symptoms of myelopathy, I feel that surgical intervention is indicated.  There is no role for conservative management for this condition.   We will proceed with C4-7 anterior cervical discectomy and fusion.  Terrika Zuver K. Izora Ribas MD, [facility] Neurosurgery

## 2022-07-30 NOTE — Transfer of Care (Signed)
Immediate Anesthesia Transfer of Care Note  Patient: [name]  Procedure(s) Performed: C4-7 ANTERIOR CERVICAL DISCECTOMY AND FUSION (GLOBUS HEDRON)  Patient Location: PACU  Anesthesia Type:General  Level of Consciousness: drowsy and patient cooperative  Airway & Oxygen Therapy: Patient Spontanous Breathing and Patient connected to face mask oxygen  Post-op Assessment: Report given to RN and Post -op Vital signs reviewed and stable  Post vital signs: Reviewed and stable  Last Vitals:  Vitals Value Taken Time  BP 166/86 07/30/22 1315  Temp 36.3 C 07/30/22 1307  Pulse 85 07/30/22 1316  Resp 14 07/30/22 1316  SpO2 96 % 07/30/22 1316  Vitals shown include unvalidated device data.  Last Pain:  Vitals:   07/30/22 0823  TempSrc: Temporal  PainSc: 0-No pain         Complications: No notable events documented.

## 2022-07-30 NOTE — Discharge Summary (Signed)
Discharge Summary  Patient ID: KAMERYN TISDEL MRN: [id number] DOB/AGE: 1960/11/03 61 y.o.  Admit date: 07/30/2022 Discharge date: 07/31/2022  Admission Diagnoses:  G95.9 cervical myelopathy, M48.02 cervical stenosis, M54.12 cervical myelopathy.   Discharge Diagnoses:  Principal Problem:   S/P cervical spinal fusion Active Problems:   Cervical myelopathy (HCC)   Cervical spinal stenosis   Radiculopathy, cervical   Discharged Condition: good  Hospital Course:  NORRIN SHREFFLER is a 61 y.o presenting with cervical myelopathy s/p C4-7 ACDF. His intraoperative course was uncomplicated and he was admitted for pain control, therapy evaluation, and drain output monitoring. He was seen by therapy and deemed appropriate for discharge home on POD1 with [facility] services. He was given prescriptions for Robaxin, Senna, and Oxycodone to take as needed.  Consults: None  Significant Diagnostic Studies: none  Treatments: surgery: as above. Please see separately dictated operative report for further details  Discharge Exam: Blood pressure (!) 147/84, pulse 73, temperature 97.9 F (36.6 C), resp. rate 16, height '5\' 7"'$  (1.702 m), weight 70.1 kg, SpO2 98 %.  AA Ox3 CNI Strength: Side Biceps Triceps Deltoid Interossei Grip Wrist Ext. Wrist Flex.  R '5 5 5 4 4 '$ 4+ 5  L '5 5 5 '$ 4+ 4+ 5 5    Side Iliopsoas Quads Hamstring PF DF EHL  R '5 5 5 '$ 4- 3 4+  L '5 5 5 '$ 4- 3 2   Incision c/d/I with dermabond in place  Disposition: Discharge disposition: 06-Home-Health Care Svc       Discharge Instructions     Incentive spirometry RT   Complete by: As directed    Remove dressing in 24 hours   Complete by: As directed       Allergies as of 07/31/2022   No Known Allergies      Medication List     STOP taking these medications    aspirin EC 81 MG tablet       TAKE these medications    ALPHA LIPOIC ACID PO Take 600 mg by mouth daily.   VITAMIN B 12 PO Take 5,000 mcg by mouth daily.  W/ folate 1700 mcg   cyanocobalamin 1000 MCG/ML injection Commonly known as: VITAMIN B12 Inject into the muscle.   lisinopril 10 MG tablet Commonly known as: ZESTRIL Take 1 tablet (10 mg total) by mouth daily.   methocarbamol 500 MG tablet Commonly known as: ROBAXIN Take 1 tablet (500 mg total) by mouth every 6 (six) hours as needed for muscle spasms.   multivitamin tablet Take 1 tablet by mouth daily.   oxyCODONE 5 MG immediate release tablet Commonly known as: Oxy IR/ROXICODONE Take 1 tablet (5 mg total) by mouth every 4 (four) hours as needed for moderate pain or severe pain ((score 4 to 6)).   rizatriptan 10 MG tablet Commonly known as: Maxalt Take 1 tablet (10 mg total) by mouth as needed for migraine. May repeat in 2 hours if needed   senna 8.6 MG Tabs tablet Commonly known as: SENOKOT Take 1 tablet (8.6 mg total) by mouth daily as needed for mild constipation.   VITAMIN D3 PO Take 5,000 Int'l Units by mouth daily.               Durable Medical Equipment  (From admission, onward)           Start     Ordered   07/31/22 1016  For home use only DME Shower stool  Once  07/31/22 1015   07/31/22 1015  For home use only DME Walker rolling  Once       Question Answer Comment  Walker: With [employer] Wheels   Patient needs a walker to treat with the following condition Impaired mobility      07/31/22 1015   07/31/22 1015  For home use only DME Bedside commode  Once       Question:  Patient needs a bedside commode to treat with the following condition  Answer:  Impaired mobility   07/31/22 1015             Signed: Loleta Dicker 07/31/2022, 10:27 AM

## 2022-07-30 NOTE — Anesthesia Preprocedure Evaluation (Signed)
Anesthesia Evaluation  Patient identified by MRN, date of birth, ID band Patient awake    Reviewed: Allergy & Precautions, H&P , NPO status , Patient's Chart, lab work & pertinent test results, reviewed documented beta blocker date and time   Airway Mallampati: III  TM Distance: >3 FB Neck ROM: full    Dental  (+) Teeth Intact   Pulmonary neg pulmonary ROS   Pulmonary exam normal        Cardiovascular Exercise Tolerance: Poor hypertension, On Medications negative cardio ROS Normal cardiovascular exam Rhythm:regular Rate:Normal     Neuro/Psych  Headaches PSYCHIATRIC DISORDERS       Neuromuscular disease    GI/Hepatic negative GI ROS, Neg liver ROS,,,  Endo/Other  negative endocrine ROS    Renal/GU negative Renal ROS  negative genitourinary   Musculoskeletal   Abdominal   Peds  Hematology  (+) Blood dyscrasia, anemia   Anesthesia Other Findings Past Medical History: No date: ADHD No date: Arthritis No date: Concussion 12/2020: COVID No date: Hypertension No date: Multiple lacunar infarcts (HCC) No date: MVP (mitral valve prolapse) No date: Pernicious anemia No date: [facility] spotted fever No date: Spinal stenosis No date: Vestibular migraine No date: Vitamin B12 deficiency No date: Vitamin D deficiency Past Surgical History: No date: BILATERAL CARPAL TUNNEL RELEASE 1984: FRACTURE SURGERY; Left     Comment:  intramedullary rod 1984: hip bone transplant No date: LEG SURGERY; Left     Comment:  oseotomy 1978: REPAIR ANKLE LIGAMENT; Right No date: REPAIR KNEE LIGAMENT; Bilateral     Comment:  acl No date: TRIGGER FINGER RELEASE; Bilateral     Comment:  thumbs BMI    Body Mass Index: 24.20 kg/m     Reproductive/Obstetrics negative OB ROS                             Anesthesia Physical Anesthesia Plan  ASA: 3  Anesthesia Plan: General ETT   Post-op Pain Management:     Induction:   PONV Risk Score and Plan: 3  Airway Management Planned:   Additional Equipment:   Intra-op Plan:   Post-operative Plan:   Informed Consent: I have reviewed the patients History and Physical, chart, labs and discussed the procedure including the risks, benefits and alternatives for the proposed anesthesia with the patient or authorized representative who has indicated his/her understanding and acceptance.     Dental Advisory Given  Plan Discussed with: CRNA  Anesthesia Plan Comments:        Anesthesia Quick Evaluation

## 2022-07-31 ENCOUNTER — Encounter: Payer: Self-pay | Admitting: Neurosurgery

## 2022-07-31 MED ORDER — METHOCARBAMOL 500 MG PO TABS: 500.0000 mg | ORAL_TABLET | Freq: Four times a day (QID) | ORAL | 0 refills | Status: DC | PRN

## 2022-07-31 MED ORDER — OXYCODONE HCL 5 MG PO TABS: 5.0000 mg | ORAL_TABLET | ORAL | 0 refills | Status: DC | PRN

## 2022-07-31 MED ORDER — SENNA 8.6 MG PO TABS: 1.0000 | ORAL_TABLET | Freq: Every day | ORAL | 0 refills | Status: DC | PRN

## 2022-07-31 NOTE — Evaluation (Signed)
Physical Therapy Evaluation Patient Details Name: [name] MRN: [id number] DOB: 1961/06/29 Today's Date: 07/31/2022  History of Present Illness  [name] is here today with a chief complaint of balance issues and frequent falls.  He is also suffering from weakness of both legs over the past several months.  His left foot weakness has been ongoing for 18 months.  His right foot has been weak for approximately 5 months.     He and his wife report that he has had some issues with his balance with frequent falls over the past several months.  He is also having worsening trouble with weakness in his hands.  He does not report clear dexterity changes, but has had more trouble with some movements of his hands.  He has had intermittent numbness in his hands and forearms. Pt is now S/P C4 thru C7 fusion with ant approach.  Clinical Impression  Pt received in Bed agreeable to PT evaluation. Pt with significant PMHx not limited to includes LLE ORIF resulting in LLD, Cerebellar stroke with unknown onset and HTN on meds since last 1 month( as per pt). Pt may be a little impulsive. PT spent extensive time educating pt and wife regarding c/s precaution including no forward bending, ROM within 30 degrees, no lifting >5 lbs, no over head arm movement. Pt and Wife demonstrated good understanding. PT assessment revealed weak B ankle muscles 2/2 to chronic myelopathy and surgeries. Pt has impaired sensation in BLE below knee L>R. Coordination impaired on L side( nose to finger) possible 2/2 to chronic cerebellar stroke. Pt ambulated with FWW with min guard in the hallways and attempted 5 steps with One HR with Min guard and VC for safety. Pt's wife feel comfortable assisting pt to enter home upon D/C. Pt's BP remained elevated through out the session without symptoms ( BP meds were not administered) 153/95 supine, 155/91 seated, standing 166/103. Pt will benefit from HHPT, 3-1 BSC, shower seat to return  home safely.       Recommendations for follow up therapy are one component of a multi-disciplinary discharge planning process, led by the attending physician.  Recommendations may be updated based on patient status, additional functional criteria and insurance authorization.  Follow Up Recommendations Home health PT      Assistance Recommended at Discharge Intermittent Supervision/Assistance  Patient can return home with the following  A little help with walking and/or transfers;A little help with bathing/dressing/bathroom;Assistance with cooking/housework;Assist for transportation;Help with stairs or ramp for entrance    Equipment Recommendations Rolling [name] (2 wheels);BSC/3in1  Recommendations for Other Services       Functional Status Assessment Patient has had a recent decline in their functional status and demonstrates the ability to make significant improvements in function in a reasonable and predictable amount of time.     Precautions / Restrictions Precautions Precautions: Other (comment) Precaution Comments: No forward bending, Head movement within 30 degrees, liftign <5 lbs and no overhead arm movement. Restrictions Weight Bearing Restrictions: No      Mobility  Bed Mobility Overal bed mobility: Independent                  Transfers Overall transfer level: Modified independent Equipment used: Rolling [name] (2 wheels)                    Ambulation/Gait Ambulation/Gait assistance: Supervision Gait Distance (Feet): 300 Feet Assistive device: Rolling [name] (2 wheels) Gait Pattern/deviations: Step-through pattern Gait velocity: dec  General Gait Details: LLD noted but steady with AD  Stairs Stairs: Yes Stairs assistance: Min guard Stair Management: One rail Right Number of Stairs: 5 General stair comments: Pt needed Min VC for sequencing and pt educated pt and wife  regarding safety and sequencing of the stair climbing.  Wheelchair  Mobility    Modified [name] (Stroke Patients Only)       Balance Overall balance assessment: Needs assistance Sitting-balance support: Feet unsupported, No upper extremity supported Sitting balance-Leahy Scale: Normal     Standing balance support: Bilateral upper extremity supported Standing balance-Leahy Scale: Good Standing balance comment: good with AD                             Pertinent Vitals/Pain Pain Assessment Pain Assessment: 0-10 Pain Score: 5  Pain Location: Neck Pain Descriptors / Indicators: Aching, Constant Pain Intervention(s): RN gave pain meds during session    Home Living Family/patient expects to be discharged to:: Private residence Living Arrangements: Spouse/significant other;Children Available Help at Discharge: Family;Available 24 hours/day Type of Home: House Home Access: Stairs to enter Entrance Stairs-Rails: Can reach both Entrance Stairs-Number of Steps: 4 Alternate Level Stairs-Number of Steps: flight Home Layout: Two level Home Equipment: None Additional Comments: Pt can stay on the main level.    Prior Function Prior Level of Function : Independent/Modified Independent;Working/employed;Driving             Mobility Comments: Pt independent at household level and community level activity participation despite impaired balance. ADLs Comments: Independent     Hand Dominance   Dominant Hand: Right    Extremity/Trunk Assessment   Upper Extremity Assessment Upper Extremity Assessment: Generalized weakness    Lower Extremity Assessment Lower Extremity Assessment: Generalized weakness (Ankles L weaker then R)       Communication   Communication: No difficulties  Cognition Arousal/Alertness: Awake/alert Behavior During Therapy: WFL for tasks assessed/performed, Impulsive Overall Cognitive Status: Within Functional Limits for tasks assessed                                          General  Comments      Exercises     Assessment/Plan    PT Assessment Patient needs continued PT services  PT Problem List Decreased strength;Decreased range of motion;Decreased activity tolerance;Decreased balance;Decreased safety awareness;Pain;Decreased knowledge of precautions       PT Treatment Interventions Gait training;Stair training;Balance training;Neuromuscular re-education;Therapeutic activities;Therapeutic exercise;Patient/family education    PT Goals (Current goals can be found in the Care Plan section)  Acute Rehab PT Goals Patient Stated Goal: " I want to get stronger and return to work" PT Goal Formulation: With patient Time For Goal Achievement: 08/14/22 Potential to Achieve Goals: Good Additional Goals Additional Goal #1: Pt will verbalize and demonstrate Neck precautions correctly 100% of the time to promote healing.    Frequency 7X/week     Co-evaluation               AM-PAC PT "6 Clicks" Mobility  Outcome Measure Help needed turning from your back to your side while in a flat bed without using bedrails?: None Help needed moving from lying on your back to sitting on the side of a flat bed without using bedrails?: None Help needed moving to and from a bed to a chair (including a wheelchair)?: None Help needed  standing up from a chair using your arms (e.g., wheelchair or bedside chair)?: A Little Help needed to walk in hospital room?: A Little Help needed climbing 3-5 steps with a railing? : A Little 6 Click Score: 21    End of Session Equipment Utilized During Treatment: Gait belt Activity Tolerance: Patient tolerated treatment well Patient left: in chair;with call bell/phone within reach;with chair alarm set;with family/visitor present Nurse Communication: Mobility status PT Visit Diagnosis: History of falling (Z91.81);Pain;Muscle weakness (generalized) (M62.81)    Time: 9024-0973 PT Time Calculation (min) (ACUTE ONLY): 74 min   Charges:   PT  Evaluation $PT Eval Moderate Complexity: 1 Mod PT Treatments $Gait Training: 8-22 mins $Therapeutic Activity: 23-37 mins   Rennae Ferraiolo PT DPT 10:39 AM,07/31/22

## 2022-07-31 NOTE — Evaluation (Signed)
Occupational Therapy Evaluation Patient Details Name: ABDULAI BLAYLOCK MRN: [id number] DOB: [birth year] Today's Date: 07/31/2022   History of Present Illness Mr. Owens Hara is here today with a chief complaint of balance issues and frequent falls.  He is also suffering from weakness of both legs over the past several months.  His left foot weakness has been ongoing for 18 months.  His right foot has been weak for approximately 5 months.     He and his wife report that he has had some issues with his balance with frequent falls over the past several months.  He is also having worsening trouble with weakness in his hands.  He does not report clear dexterity changes, but has had more trouble with some movements of his hands.  He has had intermittent numbness in his hands and forearms. Pt is now S/P C4 thru C7 fusion with ant approach.   Clinical Impression   Mr. Ducharme presents with mild pain, limited endurance, and reduced UE ROM. He lives at home with his wife and daughter, has been IND in BADL/IADL prior to surgery. During today's evaluation, pt demonstrates UE weakness and mild balance impairment but is able to complete fxl mobility tasks with Mod I/SUPV for safety. Provided educ re: falls prevention, home and routines modifications, post-surgical precautions, UB dressing, LB dressing. Pt and spouse verbalize understanding, are able to provide teach-back. Pt will benefit from [facility] post DC to assist with return to PLOF and w/ home modification suggestions and safety checks as necessary.   Recommendations for follow up therapy are one component of a multi-disciplinary discharge planning process, led by the attending physician.  Recommendations may be updated based on patient status, additional functional criteria and insurance authorization.   Follow Up Recommendations  Home health OT     Assistance Recommended at Discharge Intermittent Supervision/Assistance  Patient can return home with  the following A little help with bathing/dressing/bathroom;Assist for transportation    Functional Status Assessment  Patient has had a recent decline in their functional status and demonstrates the ability to make significant improvements in function in a reasonable and predictable amount of time.  Equipment Recommendations  BSC/3in1;Other (comment) (RW)    Recommendations for Other Services       Precautions / Restrictions Precautions Precautions: Other (comment) Precaution Comments: No forward bending, Head movement within 30 degrees, lifting<5 lbs and no overhead arm movement. Restrictions Weight Bearing Restrictions: No      Mobility Bed Mobility Overal bed mobility: Independent                  Transfers Overall transfer level: Modified independent Equipment used: Rolling walker (2 wheels)                      Balance Overall balance assessment: Needs assistance Sitting-balance support: Feet supported, No upper extremity supported Sitting balance-Leahy Scale: Good     Standing balance support: Bilateral upper extremity supported Standing balance-Leahy Scale: Good                             ADL either performed or assessed with clinical judgement   ADL Overall ADL's : Needs assistance/impaired Eating/Feeding: Supervision/ safety               Upper Body Dressing : Minimal assistance Upper Body Dressing Details (indicate cue type and reason): donning gown while following precautions re: no UE raised beyond shoulder level Lower  Body Dressing: Maximal assistance Lower Body Dressing Details (indicate cue type and reason): donning socks                     Vision         Perception     Praxis      Pertinent Vitals/Pain Pain Assessment Pain Assessment: Faces Faces Pain Scale: Hurts a little bit Pain Location: Neck, particularly w/ swallowing Pain Descriptors / Indicators: Aching, Pressure     Hand Dominance  Right   Extremity/Trunk Assessment Upper Extremity Assessment Upper Extremity Assessment: Generalized weakness   Lower Extremity Assessment Lower Extremity Assessment: Generalized weakness       Communication Communication Communication: No difficulties   Cognition Arousal/Alertness: Awake/alert Behavior During Therapy: WFL for tasks assessed/performed, Impulsive Overall Cognitive Status: Within Functional Limits for tasks assessed                                       General Comments       Exercises Other Exercises Other Exercises: Educ re: precautions, safe use of DME, falls prevention, home modifications   Shoulder Instructions      Home Living Family/patient expects to be discharged to:: Private residence Living Arrangements: Spouse/significant other;Children Available Help at Discharge: Family;Available 24 hours/day Type of Home: House Home Access: Stairs to enter [employer] of Steps: 4 Entrance Stairs-Rails: Can reach both Home Layout: Two level;Able to live on main level with bedroom/bathroom Alternate Level Stairs-Number of Steps: flight Alternate Level Stairs-Rails: Right;Left;Can reach both Bathroom Shower/Tub: Occupational psychologist: Standard     Home Equipment: Cane - single point   Additional Comments: Pt can stay on the main level.      Prior Functioning/Environment Prior Level of Function : Independent/Modified Independent;Working/employed;Driving             Mobility Comments: Pt independent at household level and community level activity participation despite impaired balance. ADLs Comments: Independent        OT Problem List: Decreased strength;Decreased range of motion;Decreased activity tolerance;Impaired balance (sitting and/or standing);Impaired UE functional use;Decreased knowledge of precautions      OT Treatment/Interventions:      OT Goals(Current goals can be found in the care plan  section) Acute Rehab OT Goals Patient Stated Goal: to have fewer falls Time For Goal Achievement: 08/14/22 Potential to Achieve Goals: Good  OT Frequency:      Co-evaluation              AM-PAC OT "6 Clicks" Daily Activity     Outcome Measure Help from another person eating meals?: None Help from another person taking care of personal grooming?: A Little Help from another person toileting, which includes using toliet, bedpan, or urinal?: A Little Help from another person bathing (including washing, rinsing, drying)?: A Little Help from another person to put on and taking off regular upper body clothing?: A Little Help from another person to put on and taking off regular lower body clothing?: A Lot 6 Click Score: 18   End of Session Equipment Utilized During Treatment: Rolling walker (2 wheels)  Activity Tolerance: Patient tolerated treatment well Patient left: in chair;with call bell/phone within reach;with nursing/sitter in room;with family/visitor present  OT Visit Diagnosis: Unsteadiness on feet (R26.81);Repeated falls (R29.6);Muscle weakness (generalized) (M62.81);History of falling (Z91.81)  Time: 1020-1040 OT Time Calculation (min): 20 min Charges:  OT General Charges $OT Visit: 1 Visit OT Evaluation $OT Eval Low Complexity: 1 Low OT Treatments $Self Care/Home Management : 8-22 mins Josiah Lobo, PhD, MS, OTR/L 07/31/22, 10:51 AM

## 2022-07-31 NOTE — Plan of Care (Signed)

## 2022-07-31 NOTE — Progress Notes (Addendum)
Patient from home with wife He needs a 3 in 1, rolling walker [employer] will deliver to the bedside [facility] accept4ed for [facility] services

## 2022-07-31 NOTE — Plan of Care (Signed)
  Problem: Education: Goal: Knowledge of the prescribed therapeutic regimen will improve Outcome: Progressing   Problem: Education: Goal: Understanding of discharge needs will improve Outcome: Progressing   Problem: Activity: Goal: Will remain free from falls Outcome: Progressing   Problem: Pain Management: Goal: Pain level will decrease Outcome: Progressing   Problem: Clinical Measurements: Goal: Ability to maintain clinical measurements within normal limits will improve Outcome: Progressing   Problem: Nutrition: Goal: Adequate nutrition will be maintained Outcome: Progressing   Problem: Safety: Goal: Ability to remain free from injury will improve Outcome: Progressing   Problem: Skin Integrity: Goal: Risk for impaired skin integrity will decrease Outcome: Progressing

## 2022-07-31 NOTE — Progress Notes (Signed)
    Attending Progress Note  History: ELISAH PARMER is s/p C4-7 ACDF for myelopathy and radiculopathy.   POD1: Expected posterior neck pain this morning. No other concerns  Physical Exam: Vitals:   07/31/22 0042 07/31/22 0727  BP: 138/83 (!) 147/84  Pulse: 87 73  Resp: 18 16  Temp: 97.7 F (36.5 C) 97.9 F (36.6 C)  SpO2: 98% 98%    AA Ox3 CNI Strength: Side Biceps Triceps Deltoid Interossei Grip Wrist Ext. Wrist Flex.  R '5 5 5 4 4 '$ 4+ 5  L '5 5 5 '$ 4+ 4+ 5 5    Side Iliopsoas Quads Hamstring PF DF EHL  R '5 5 5 '$ 4- 3 4+  L '5 5 5 '$ 4- 3 2   Incision c/d/I with dermabond in place JP output 90 since surgery  Data:  Other tests/results: none  Assessment/Plan:  MOSIAH BASTIN is a 61 y.o s/p ACDF for myelopathy and radiculopathy. Pt reports improved pain this morning but strength is largely unchanges  - mobilize - pain control - DVT prophylaxis - PTOT; d/c pending recommendations  - JP removed this morning   Cooper Render PA-C Department of Neurosurgery

## 2022-07-31 NOTE — TOC Initial Note (Signed)
Transition of Care [facility]) - Initial/Assessment Note    Patient Details  Name: [name] MRN: [id number] Date of Birth: [birth year]  [facility] CM/SW Contact:    Conception Oms, RN Phone Number: 07/31/2022, 9:09 AM  Clinical Narrative:                  TOC following the patient and will assist with DC planning and needs, PT to eval, I reached out to meg at [facility] and asked if they are set up, awaiting a response       Patient Goals and CMS Choice        Expected Discharge Plan and Services                                                Prior Living Arrangements/Services                       Activities of Daily Living Home Assistive Devices/Equipment: Cane (specify quad or straight) ADL Screening (condition at time of admission) Patient's cognitive ability adequate to safely complete daily activities?: No Is the patient deaf or have difficulty hearing?: No Does the patient have difficulty seeing, even when wearing glasses/contacts?: No Does the patient have difficulty concentrating, remembering, or making decisions?: No Patient able to express need for assistance with ADLs?: Yes Does the patient have difficulty dressing or bathing?: No Independently performs ADLs?: Yes (appropriate for developmental age) Does the patient have difficulty walking or climbing stairs?: No Weakness of Legs: Both Weakness of Arms/Hands: None  Permission Sought/Granted                  Emotional Assessment              Admission diagnosis:  S/P cervical spinal fusion [Z98.1] Patient Active Problem List   Diagnosis Date Noted   Cervical myelopathy ([place]) 07/30/2022   Cervical spinal stenosis 07/30/2022   Radiculopathy, cervical 07/30/2022   S/P cervical spinal fusion 07/30/2022   Vitamin D deficiency 06/25/2022   B12 deficiency 06/25/2022   Elevated LDL cholesterol level 06/25/2022   Prediabetes 03/27/2021   History of nausea-  reported 01/27/2020 02/02/2020   Accidental fall from ladder 02/02/2020   Acute right-sided thoracic back pain 02/02/2020   Neck stiffness 02/02/2020   Concussion with no loss of consciousness 02/02/2020   Multiple falls 02/02/2020   Head trauma, initial encounter 02/02/2020   Acute post-traumatic headache, not intractable 02/02/2020   Essential hypertension 08/26/2018   Pre-bariatric surgery nutrition evaluation 06/19/2018   Migraine with vertigo 01/05/2014   Arthralgia of multiple joints 04/28/2009   Carpal tunnel syndrome 08/30/2008   PCP:  Jon Billings, NP Pharmacy:   [id number] [id number]: [phone]: [phone]    Social Determinants of Health (SDOH) Interventions    Readmission Risk Interventions     No data to display

## 2022-08-01 ENCOUNTER — Other Ambulatory Visit: Payer: Self-pay

## 2022-08-01 ENCOUNTER — Encounter: Payer: Self-pay | Admitting: Neurosurgery

## 2022-08-01 ENCOUNTER — Emergency Department: Payer: 59 | Admitting: Anesthesiology

## 2022-08-01 ENCOUNTER — Encounter
Admission: EM | Disposition: A | Discharge status: Home Health | Payer: Self-pay | Source: Home / Self Care | Attending: Neurosurgery

## 2022-08-01 ENCOUNTER — Inpatient Hospital Stay (HOSPITAL_BASED_OUTPATIENT_CLINIC_OR_DEPARTMENT_OTHER)
Admission: EM | Admit: 2022-08-01 | Discharge: 2022-08-03 | Disposition: A | Discharge status: Home Health | Payer: 59 | Source: Home / Self Care | Attending: Neurosurgery | Admitting: Neurosurgery

## 2022-08-01 ENCOUNTER — Emergency Department: Payer: 59

## 2022-08-01 DIAGNOSIS — E559 Vitamin D deficiency, unspecified: Secondary | ICD-10-CM | POA: Diagnosis present

## 2022-08-01 DIAGNOSIS — I10 Essential (primary) hypertension: Secondary | ICD-10-CM | POA: Diagnosis present

## 2022-08-01 DIAGNOSIS — F909 Attention-deficit hyperactivity disorder, unspecified type: Secondary | ICD-10-CM | POA: Diagnosis present

## 2022-08-01 DIAGNOSIS — Z9889 Other specified postprocedural states: Secondary | ICD-10-CM | POA: Diagnosis not present

## 2022-08-01 DIAGNOSIS — R0602 Shortness of breath: Secondary | ICD-10-CM | POA: Diagnosis present

## 2022-08-01 DIAGNOSIS — G8918 Other acute postprocedural pain: Secondary | ICD-10-CM | POA: Diagnosis not present

## 2022-08-01 DIAGNOSIS — I341 Nonrheumatic mitral (valve) prolapse: Secondary | ICD-10-CM | POA: Diagnosis present

## 2022-08-01 DIAGNOSIS — Y839 Surgical procedure, unspecified as the cause of abnormal reaction of the patient, or of later complication, without mention of misadventure at the time of the procedure: Secondary | ICD-10-CM | POA: Diagnosis present

## 2022-08-01 DIAGNOSIS — Y838 Other surgical procedures as the cause of abnormal reaction of the patient, or of later complication, without mention of misadventure at the time of the procedure: Secondary | ICD-10-CM | POA: Diagnosis present

## 2022-08-01 DIAGNOSIS — R221 Localized swelling, mass and lump, neck: Secondary | ICD-10-CM | POA: Diagnosis not present

## 2022-08-01 DIAGNOSIS — J398 Other specified diseases of upper respiratory tract: Secondary | ICD-10-CM | POA: Diagnosis present

## 2022-08-01 DIAGNOSIS — Z8616 Personal history of COVID-19: Secondary | ICD-10-CM

## 2022-08-01 DIAGNOSIS — Z981 Arthrodesis status: Secondary | ICD-10-CM

## 2022-08-01 DIAGNOSIS — S1093XA Contusion of unspecified part of neck, initial encounter: Secondary | ICD-10-CM

## 2022-08-01 DIAGNOSIS — M542 Cervicalgia: Secondary | ICD-10-CM | POA: Diagnosis not present

## 2022-08-01 DIAGNOSIS — M96840 Postprocedural hematoma of a musculoskeletal structure following a musculoskeletal system procedure: Secondary | ICD-10-CM | POA: Diagnosis present

## 2022-08-01 DIAGNOSIS — T148XXA Other injury of unspecified body region, initial encounter: Secondary | ICD-10-CM | POA: Diagnosis not present

## 2022-08-01 DIAGNOSIS — R1310 Dysphagia, unspecified: Secondary | ICD-10-CM | POA: Diagnosis present

## 2022-08-01 DIAGNOSIS — Z8619 Personal history of other infectious and parasitic diseases: Secondary | ICD-10-CM

## 2022-08-01 DIAGNOSIS — E538 Deficiency of other specified B group vitamins: Secondary | ICD-10-CM | POA: Diagnosis present

## 2022-08-01 HISTORY — PX: CERVICAL WOUND DEBRIDEMENT: SHX6694

## 2022-08-01 LAB — CBC WITH DIFFERENTIAL/PLATELET
Abs Immature Granulocytes: 0.05 10*3/uL (ref 0.00–0.07)
Basophils Absolute: 0 10*3/uL (ref 0.0–0.1)
Basophils Relative: 0 %
Eosinophils Absolute: 0 10*3/uL (ref 0.0–0.5)
Eosinophils Relative: 0 %
HCT: 42.6 % (ref 39.0–52.0)
Hemoglobin: 14.1 g/dL (ref 13.0–17.0)
Immature Granulocytes: 0 %
Lymphocytes Relative: 26 %
Lymphs Abs: 3.5 10*3/uL (ref 0.7–4.0)
MCH: 31 pg (ref 26.0–34.0)
MCHC: 33.1 g/dL (ref 30.0–36.0)
MCV: 93.6 fL (ref 80.0–100.0)
Monocytes Absolute: 0.8 10*3/uL (ref 0.1–1.0)
Monocytes Relative: 6 %
Neutro Abs: 8.9 10*3/uL — ABNORMAL HIGH (ref 1.7–7.7)
Neutrophils Relative %: 68 %
Platelets: 228 10*3/uL (ref 150–400)
RBC: 4.55 MIL/uL (ref 4.22–5.81)
RDW: 12.6 % (ref 11.5–15.5)
WBC: 13.4 10*3/uL — ABNORMAL HIGH (ref 4.0–10.5)
nRBC: 0 % (ref 0.0–0.2)

## 2022-08-01 LAB — BASIC METABOLIC PANEL
Anion gap: 8 (ref 5–15)
BUN: 13 mg/dL (ref 8–23)
CO2: 27 mmol/L (ref 22–32)
Calcium: 8.8 mg/dL — ABNORMAL LOW (ref 8.9–10.3)
Chloride: 105 mmol/L (ref 98–111)
Creatinine, Ser: 0.73 mg/dL (ref 0.61–1.24)
GFR, Estimated: >60 mL/min (ref >60)
Glucose, Bld: 131 mg/dL — ABNORMAL HIGH (ref 70–99)
Potassium: 3.8 mmol/L (ref 3.5–5.1)
Sodium: 140 mmol/L (ref 135–145)

## 2022-08-01 LAB — BRAIN NATRIURETIC PEPTIDE: B Natriuretic Peptide: 15.9 pg/mL (ref 0.0–100.0)

## 2022-08-01 SURGERY — CERVICAL WOUND DEBRIDEMENT
Anesthesia: General

## 2022-08-01 MED ORDER — ONDANSETRON HCL 4 MG/2ML IJ SOLN
4.0000 mg | Freq: Once | INTRAMUSCULAR | Status: AC
  Administered 2022-08-01: 4 mg via INTRAVENOUS
  Filled 2022-08-01: qty 2

## 2022-08-01 MED ORDER — ACETAMINOPHEN 10 MG/ML IV SOLN: 1000.0000 mg | Freq: Once | INTRAVENOUS | Status: DC | PRN

## 2022-08-01 MED ORDER — ACETAMINOPHEN 650 MG RE SUPP: 650.0000 mg | RECTAL | Status: DC | PRN

## 2022-08-01 MED ORDER — METHOCARBAMOL 500 MG PO TABS
ORAL_TABLET | ORAL | Status: AC
  Filled 2022-08-01: qty 1

## 2022-08-01 MED ORDER — MORPHINE SULFATE (PF) 4 MG/ML IV SOLN
4.0000 mg | Freq: Once | INTRAVENOUS | Status: AC
  Administered 2022-08-01: 4 mg via INTRAVENOUS
  Filled 2022-08-01: qty 1

## 2022-08-01 MED ORDER — METHOCARBAMOL 500 MG PO TABS
500.0000 mg | ORAL_TABLET | Freq: Four times a day (QID) | ORAL | Status: DC | PRN
  Administered 2022-08-01 – 2022-08-03 (×2): 500 mg via ORAL
  Filled 2022-08-01: qty 1

## 2022-08-01 MED ORDER — CEFAZOLIN SODIUM-DEXTROSE 2-4 GM/100ML-% IV SOLN
INTRAVENOUS | Status: AC
  Administered 2022-08-01: 2 g via INTRAVENOUS
  Filled 2022-08-01: qty 100

## 2022-08-01 MED ORDER — SODIUM CHLORIDE 0.9 % IV SOLN: 250.0000 mL | INTRAVENOUS | Status: DC

## 2022-08-01 MED ORDER — ONDANSETRON HCL 4 MG/2ML IJ SOLN
INTRAMUSCULAR | Status: DC | PRN
  Administered 2022-08-01: 4 mg via INTRAVENOUS

## 2022-08-01 MED ORDER — PROMETHAZINE HCL 25 MG/ML IJ SOLN: 6.2500 mg | INTRAMUSCULAR | Status: DC | PRN

## 2022-08-01 MED ORDER — GLYCOPYRROLATE 0.2 MG/ML IJ SOLN
INTRAMUSCULAR | Status: AC
  Filled 2022-08-01: qty 1

## 2022-08-01 MED ORDER — HYDROMORPHONE HCL 1 MG/ML IJ SOLN
INTRAMUSCULAR | Status: AC
  Filled 2022-08-01: qty 1

## 2022-08-01 MED ORDER — CELECOXIB 200 MG PO CAPS
ORAL_CAPSULE | ORAL | Status: AC
  Filled 2022-08-01: qty 1

## 2022-08-01 MED ORDER — SODIUM CHLORIDE 0.9% FLUSH
3.0000 mL | Freq: Two times a day (BID) | INTRAVENOUS | Status: DC
  Administered 2022-08-03: 3 mL via INTRAVENOUS

## 2022-08-01 MED ORDER — ONDANSETRON HCL 4 MG/2ML IJ SOLN: 4.0000 mg | Freq: Four times a day (QID) | INTRAMUSCULAR | Status: DC | PRN

## 2022-08-01 MED ORDER — SODIUM CHLORIDE 0.9 % IV BOLUS
500.0000 mL | Freq: Once | INTRAVENOUS | Status: AC
  Administered 2022-08-01: 500 mL via INTRAVENOUS

## 2022-08-01 MED ORDER — CELECOXIB 200 MG PO CAPS
200.0000 mg | ORAL_CAPSULE | Freq: Two times a day (BID) | ORAL | Status: DC
  Administered 2022-08-01: 200 mg via ORAL

## 2022-08-01 MED ORDER — SUMATRIPTAN SUCCINATE 50 MG PO TABS: 50.0000 mg | ORAL_TABLET | ORAL | Status: DC | PRN

## 2022-08-01 MED ORDER — BISACODYL 5 MG PO TBEC: 5.0000 mg | DELAYED_RELEASE_TABLET | Freq: Every day | ORAL | Status: DC | PRN

## 2022-08-01 MED ORDER — SALINE SPRAY 0.65 % NA SOLN
1.0000 | Freq: Once | NASAL | Status: AC
  Administered 2022-08-01: 1 via NASAL
  Filled 2022-08-01: qty 44

## 2022-08-01 MED ORDER — FENTANYL CITRATE (PF) 100 MCG/2ML IJ SOLN
INTRAMUSCULAR | Status: DC | PRN
  Administered 2022-08-01 (×2): 50 ug via INTRAVENOUS

## 2022-08-01 MED ORDER — CEFAZOLIN SODIUM-DEXTROSE 2-3 GM-%(50ML) IV SOLR
INTRAVENOUS | Status: DC | PRN
  Administered 2022-08-01: 2 g via INTRAVENOUS

## 2022-08-01 MED ORDER — LORAZEPAM 1 MG PO TABS
1.0000 mg | ORAL_TABLET | Freq: Four times a day (QID) | ORAL | Status: DC | PRN
  Administered 2022-08-01 (×2): 1 mg via ORAL
  Filled 2022-08-01 (×5): qty 1

## 2022-08-01 MED ORDER — OXYCODONE HCL 5 MG PO TABS: 5.0000 mg | ORAL_TABLET | Freq: Once | ORAL | Status: DC | PRN

## 2022-08-01 MED ORDER — LISINOPRIL 10 MG PO TABS
10.0000 mg | ORAL_TABLET | Freq: Every day | ORAL | Status: DC
  Administered 2022-08-01 – 2022-08-03 (×2): 10 mg via ORAL
  Filled 2022-08-01: qty 1

## 2022-08-01 MED ORDER — PROPOFOL 10 MG/ML IV BOLUS
INTRAVENOUS | Status: DC | PRN
  Administered 2022-08-01: 150 mg via INTRAVENOUS

## 2022-08-01 MED ORDER — ACETAMINOPHEN 500 MG PO TABS
ORAL_TABLET | ORAL | Status: AC
  Filled 2022-08-01: qty 2

## 2022-08-01 MED ORDER — PHENOL 1.4 % MT LIQD
1.0000 | OROMUCOSAL | Status: DC | PRN
  Filled 2022-08-01: qty 177

## 2022-08-01 MED ORDER — LIDOCAINE HCL (CARDIAC) PF 100 MG/5ML IV SOSY
PREFILLED_SYRINGE | INTRAVENOUS | Status: DC | PRN
  Administered 2022-08-01: 100 mg via INTRAVENOUS

## 2022-08-01 MED ORDER — ORAL CARE MOUTH RINSE: 15.0000 mL | OROMUCOSAL | Status: DC | PRN

## 2022-08-01 MED ORDER — LISINOPRIL 5 MG PO TABS
ORAL_TABLET | ORAL | Status: AC
  Filled 2022-08-01: qty 2

## 2022-08-01 MED ORDER — DEXAMETHASONE SODIUM PHOSPHATE 10 MG/ML IJ SOLN
INTRAMUSCULAR | Status: AC
  Filled 2022-08-01: qty 1

## 2022-08-01 MED ORDER — SODIUM CHLORIDE 0.9% FLUSH
3.0000 mL | INTRAVENOUS | Status: DC | PRN
  Administered 2022-08-03: 3 mL via INTRAVENOUS

## 2022-08-01 MED ORDER — POTASSIUM CHLORIDE IN NACL 20-0.9 MEQ/L-% IV SOLN
INTRAVENOUS | Status: DC
  Filled 2022-08-01 (×2): qty 1000

## 2022-08-01 MED ORDER — ONDANSETRON HCL 4 MG/2ML IJ SOLN
INTRAMUSCULAR | Status: AC
  Filled 2022-08-01: qty 2

## 2022-08-01 MED ORDER — 0.9 % SODIUM CHLORIDE (POUR BTL) OPTIME
TOPICAL | Status: DC | PRN
  Administered 2022-08-01: 600 mL

## 2022-08-01 MED ORDER — ACETAMINOPHEN 500 MG PO TABS
ORAL_TABLET | ORAL | Status: AC
  Administered 2022-08-01: 1000 mg via ORAL
  Filled 2022-08-01: qty 2

## 2022-08-01 MED ORDER — PROPOFOL 10 MG/ML IV BOLUS
INTRAVENOUS | Status: AC
  Filled 2022-08-01: qty 20

## 2022-08-01 MED ORDER — DEXAMETHASONE SODIUM PHOSPHATE 10 MG/ML IJ SOLN
INTRAMUSCULAR | Status: DC | PRN
  Administered 2022-08-01: 10 mg via INTRAVENOUS

## 2022-08-01 MED ORDER — OXYCODONE HCL 5 MG PO TABS
ORAL_TABLET | ORAL | Status: AC
  Administered 2022-08-01: 5 mg
  Filled 2022-08-01: qty 1

## 2022-08-01 MED ORDER — DROPERIDOL 2.5 MG/ML IJ SOLN: 0.6250 mg | Freq: Once | INTRAMUSCULAR | Status: DC | PRN

## 2022-08-01 MED ORDER — SENNA 8.6 MG PO TABS: 1.0000 | ORAL_TABLET | Freq: Every day | ORAL | Status: DC | PRN

## 2022-08-01 MED ORDER — OXYCODONE HCL 5 MG PO TABS
5.0000 mg | ORAL_TABLET | ORAL | Status: DC | PRN
  Administered 2022-08-02 – 2022-08-03 (×2): 5 mg via ORAL
  Filled 2022-08-01 (×2): qty 1

## 2022-08-01 MED ORDER — FLEET ENEMA 7-19 GM/118ML RE ENEM: 1.0000 | ENEMA | Freq: Once | RECTAL | Status: DC | PRN

## 2022-08-01 MED ORDER — LACTATED RINGERS IV SOLN: INTRAVENOUS | Status: DC | PRN

## 2022-08-01 MED ORDER — CEFAZOLIN SODIUM-DEXTROSE 2-4 GM/100ML-% IV SOLN: 2.0000 g | Freq: Three times a day (TID) | INTRAVENOUS | Status: AC

## 2022-08-01 MED ORDER — FENTANYL CITRATE (PF) 100 MCG/2ML IJ SOLN
INTRAMUSCULAR | Status: AC
  Filled 2022-08-01: qty 2

## 2022-08-01 MED ORDER — GLYCOPYRROLATE 0.2 MG/ML IJ SOLN
INTRAMUSCULAR | Status: DC | PRN
  Administered 2022-08-01: .2 mg via INTRAVENOUS

## 2022-08-01 MED ORDER — ACETAMINOPHEN 325 MG PO TABS
650.0000 mg | ORAL_TABLET | ORAL | Status: DC | PRN
  Administered 2022-08-02: 650 mg via ORAL
  Filled 2022-08-01: qty 2

## 2022-08-01 MED ORDER — ZOLPIDEM TARTRATE 5 MG PO TABS
5.0000 mg | ORAL_TABLET | Freq: Every evening | ORAL | Status: DC | PRN
  Administered 2022-08-01: 5 mg via ORAL

## 2022-08-01 MED ORDER — DEXAMETHASONE SODIUM PHOSPHATE 10 MG/ML IJ SOLN
10.0000 mg | Freq: Once | INTRAMUSCULAR | Status: AC
  Administered 2022-08-01: 10 mg via INTRAVENOUS
  Filled 2022-08-01: qty 1

## 2022-08-01 MED ORDER — ACETAMINOPHEN 500 MG PO TABS
1000.0000 mg | ORAL_TABLET | Freq: Four times a day (QID) | ORAL | Status: AC
  Administered 2022-08-01 (×2): 1000 mg via ORAL

## 2022-08-01 MED ORDER — ONDANSETRON HCL 4 MG PO TABS: 4.0000 mg | ORAL_TABLET | Freq: Four times a day (QID) | ORAL | Status: DC | PRN

## 2022-08-01 MED ORDER — PHENYLEPHRINE HCL (PRESSORS) 10 MG/ML IV SOLN
INTRAVENOUS | Status: DC | PRN
  Administered 2022-08-01: 160 ug via INTRAVENOUS
  Administered 2022-08-01: 80 ug via INTRAVENOUS
  Administered 2022-08-01: 160 ug via INTRAVENOUS
  Administered 2022-08-01: 80 ug via INTRAVENOUS

## 2022-08-01 MED ORDER — CELECOXIB 200 MG PO CAPS
ORAL_CAPSULE | ORAL | Status: AC
  Administered 2022-08-01: 200 mg
  Filled 2022-08-01: qty 1

## 2022-08-01 MED ORDER — PHENYLEPHRINE 80 MCG/ML (10ML) SYRINGE FOR IV PUSH (FOR BLOOD PRESSURE SUPPORT)
PREFILLED_SYRINGE | INTRAVENOUS | Status: AC
  Filled 2022-08-01: qty 10

## 2022-08-01 MED ORDER — LIDOCAINE HCL (PF) 2 % IJ SOLN
INTRAMUSCULAR | Status: AC
  Filled 2022-08-01: qty 5

## 2022-08-01 MED ORDER — FENTANYL CITRATE (PF) 100 MCG/2ML IJ SOLN: 25.0000 ug | INTRAMUSCULAR | Status: DC | PRN

## 2022-08-01 MED ORDER — MENTHOL 3 MG MT LOZG
1.0000 | LOZENGE | OROMUCOSAL | Status: DC | PRN
  Filled 2022-08-01: qty 9

## 2022-08-01 MED ORDER — HYDROMORPHONE HCL 1 MG/ML IJ SOLN
0.2500 mg | INTRAMUSCULAR | Status: DC | PRN
  Administered 2022-08-01 (×2): 0.5 mg via INTRAVENOUS

## 2022-08-01 MED ORDER — IRRISEPT - 450ML BOTTLE WITH 0.05% CHG IN STERILE WATER, USP 99.95% OPTIME
TOPICAL | Status: DC | PRN
  Administered 2022-08-01: 450 mL

## 2022-08-01 MED ORDER — OXYCODONE HCL 5 MG/5ML PO SOLN: 5.0000 mg | Freq: Once | ORAL | Status: DC | PRN

## 2022-08-01 MED ORDER — ZOLPIDEM TARTRATE 5 MG PO TABS
ORAL_TABLET | ORAL | Status: AC
  Administered 2022-08-01: 5 mg
  Filled 2022-08-01: qty 1

## 2022-08-01 MED ORDER — SUCCINYLCHOLINE CHLORIDE 200 MG/10ML IV SOSY
PREFILLED_SYRINGE | INTRAVENOUS | Status: DC | PRN
  Administered 2022-08-01: 100 mg via INTRAVENOUS

## 2022-08-01 MED ORDER — LORAZEPAM 2 MG/ML IJ SOLN
0.5000 mg | Freq: Once | INTRAMUSCULAR | Status: AC
  Administered 2022-08-01: 0.5 mg via INTRAVENOUS
  Filled 2022-08-01: qty 1

## 2022-08-01 SURGICAL SUPPLY — 55 items
BASIN KIT SINGLE STR (MISCELLANEOUS) ×1 IMPLANT
BASKET BONE COLLECTION (BASKET) IMPLANT
BULB RESERV EVAC DRAIN JP 100C (MISCELLANEOUS) IMPLANT
BUR NEURO DRILL SOFT 3.0X3.8M (BURR) ×1 IMPLANT
DERMABOND ADVANCED .7 DNX12 (GAUZE/BANDAGES/DRESSINGS) ×1 IMPLANT
DRAIN CHANNEL JP 10F RND 20C F (MISCELLANEOUS) IMPLANT
DRAPE C ARM PK CFD 31 SPINE (DRAPES) ×1 IMPLANT
DRAPE LAPAROTOMY 77X122 PED (DRAPES) ×1 IMPLANT
DRAPE MICROSCOPE SPINE 48X150 (DRAPES) ×1 IMPLANT
DRAPE SURG 17X11 SM STRL (DRAPES) ×1 IMPLANT
DRSG TEGADERM 4X4.75 (GAUZE/BANDAGES/DRESSINGS) IMPLANT
ELECT REM PT RETURN 9FT ADLT (ELECTROSURGICAL) ×1
ELECTRODE REM PT RTRN 9FT ADLT (ELECTROSURGICAL) ×1 IMPLANT
FEE INTRAOP CADWELL SUPPLY NCS (MISCELLANEOUS) IMPLANT
FEE INTRAOP MONITOR IMPULS NCS (MISCELLANEOUS) IMPLANT
GLOVE BIOGEL PI IND STRL 6.5 (GLOVE) ×2 IMPLANT
GLOVE BIOGEL PI IND STRL 8.5 (GLOVE) ×1 IMPLANT
GLOVE SURG SYN 6.5 ES PF (GLOVE) ×2 IMPLANT
GLOVE SURG SYN 6.5 PF PI (GLOVE) ×2 IMPLANT
GLOVE SURG SYN 8.5  E (GLOVE) ×3
GLOVE SURG SYN 8.5 E (GLOVE) ×3 IMPLANT
GLOVE SURG SYN 8.5 PF PI (GLOVE) ×3 IMPLANT
GOWN SRG LRG LVL 4 IMPRV REINF (GOWNS) ×2 IMPLANT
GOWN SRG XL LVL 3 NONREINFORCE (GOWNS) ×1 IMPLANT
GOWN STRL NON-REIN TWL XL LVL3 (GOWNS) ×1
GOWN STRL REIN LRG LVL4 (GOWNS) ×2
INTRAOP CADWELL SUPPLY FEE NCS (MISCELLANEOUS)
INTRAOP DISP SUPPLY FEE NCS (MISCELLANEOUS)
INTRAOP MONITOR FEE IMPULS NCS (MISCELLANEOUS)
INTRAOP MONITOR FEE IMPULSE (MISCELLANEOUS)
JET LAVAGE IRRISEPT WOUND (IRRIGATION / IRRIGATOR) ×1
KIT TURNOVER KIT A (KITS) ×1 IMPLANT
LAVAGE JET IRRISEPT WOUND (IRRIGATION / IRRIGATOR) IMPLANT
MANIFOLD NEPTUNE II (INSTRUMENTS) ×1 IMPLANT
NDL SAFETY ECLIP 18X1.5 (MISCELLANEOUS) ×1 IMPLANT
NS IRRIG 1000ML POUR BTL (IV SOLUTION) ×1 IMPLANT
PACK LAMINECTOMY NEURO (CUSTOM PROCEDURE TRAY) ×1 IMPLANT
PAD ARMBOARD 7.5X6 YLW CONV (MISCELLANEOUS) ×2 IMPLANT
PIN CASPAR 14 (PIN) ×1 IMPLANT
PIN CASPAR 14MM (PIN) IMPLANT
SPONGE DRAIN TRACH 4X4 STRL 2S (GAUZE/BANDAGES/DRESSINGS) IMPLANT
SPONGE KITTNER 5P (MISCELLANEOUS) ×1 IMPLANT
SPONGE VERSALON 4X4 4PLY (MISCELLANEOUS) IMPLANT
STAPLER SKIN PROX 35W (STAPLE) IMPLANT
SURGIFLO W/THROMBIN 8M KIT (HEMOSTASIS) ×1 IMPLANT
SUT ETHILON 3-0 FS-10 30 BLK (SUTURE) ×1
SUT V-LOC 90 ABS DVC 3-0 CL (SUTURE) ×1 IMPLANT
SUT VIC AB 3-0 SH 27 (SUTURE) ×1
SUT VIC AB 3-0 SH 27X BRD (SUTURE) IMPLANT
SUT VIC AB 3-0 SH 8-18 (SUTURE) ×1 IMPLANT
SUTURE EHLN 3-0 FS-10 30 BLK (SUTURE) IMPLANT
SYR 20ML LL LF (SYRINGE) ×1 IMPLANT
TAPE CLOTH 3X10 WHT NS LF (GAUZE/BANDAGES/DRESSINGS) ×3 IMPLANT
TRAP FLUID SMOKE EVACUATOR (MISCELLANEOUS) ×1 IMPLANT
TRAY FOLEY MTR SLVR 16FR STAT (SET/KITS/TRAYS/PACK) IMPLANT

## 2022-08-01 NOTE — H&P (Addendum)
Consult requested by:  Dr. Beather Arbour   Consult requested for:  Concern for post-operative clot  Primary Physician:  [name] Billings, NP  History of Present Illness: 08/01/2022 [name] is here today with a chief complaint of worsening shortness of breath.  He underwent an uncomplicated anterior cervical discectomy and fusion on July 30, 2022.  He was discharged yesterday and stable condition.  He had a drain placed during his initial surgical procedure.  That was removed according to our protocols due to decreased output overnight on postoperative day 0 to postoperative day 1.  Over the evening after discharge, he noticed progressive difficulty with swallowing and breathing.  He woke his wife at approximately 1 AM due to shortness of breath and was brought to the emergency department for evaluation.  On initial evaluation, he was noted to have drainage from his drain site.  This was bandaged.  He also had significant neck swelling.  He reports tightness in his neck.  He is able to swallow, but is having some difficulty.  His voice is changed and is now somewhat raspy.  He has not noticed a change in his breathing, in particular with some resistance to breathing air in through his mouth.  Review of Systems:  A 10 point review of systems is negative, except for the pertinent positives and negatives detailed in the HPI.  Past Medical History: Past Medical History:  Diagnosis Date   ADHD    Arthritis    Concussion    COVID 12/2020   Hypertension    Multiple lacunar infarcts (HCC)    MVP (mitral valve prolapse)    Pernicious anemia    [facility] spotted fever    Spinal stenosis    Vestibular migraine    Vitamin B12 deficiency    Vitamin D deficiency     Past Surgical History: Past Surgical History:  Procedure Laterality Date   BILATERAL CARPAL TUNNEL RELEASE     FRACTURE SURGERY Left 1984   intramedullary rod   hip bone transplant  1984   LEG SURGERY Left     oseotomy   REPAIR ANKLE LIGAMENT Right 1978   REPAIR KNEE LIGAMENT Bilateral    acl   TRIGGER FINGER RELEASE Bilateral    thumbs    Allergies: Allergies as of 08/01/2022   (No Known Allergies)    Medications: Current Facility-Administered Medications for the 08/01/22 encounter [facility] Encounter)  Medication   cyanocobalamin (VITAMIN B12) injection 1,000 mcg   No outpatient medications have been marked as taking for the 08/01/22 encounter [facility] Encounter).    Social History: Social History   Tobacco Use   Smoking status: Never   Smokeless tobacco: Never  Vaping Use   Vaping Use: Never used  Substance Use Topics   Alcohol use: Yes    Alcohol/week: 1.0 standard drink of alcohol    Types: 1 Cans of beer per week   Drug use: No    Family Medical History: Family History  Problem Relation Age of Onset   Breast cancer Mother    Thyroid cancer Mother    Lymphoma Father    CVA Father    Heart attack Sister    Hypertension Sister     Physical Examination: Vitals:   08/01/22 0425 08/01/22 0430  BP: (!) 157/88 (!) 155/95  Pulse: 96 81  Resp: 18 11  Temp:    SpO2: 96% 97%    General: Patient is well developed, well nourished.  He has a moderate  distress and is somewhat concerned.   Neck:   He has extensive swelling and bruising.  His neck is very full.  There are some drainage from his drain exit site, but his primary incision is well-approximated without any drainage and currently dressed by Dermabond.  Respiratory: Patient is breathing with some resistance to flow due to likely airway edema.   NEUROLOGICAL:     Awake, alert, oriented to person, place, and time.  Voice is soft but intelligible.  CNI MAEW  Cranial Nerves:  Gait is untested.     Medical Decision Making  Imaging: Xray 08/01/2022 FINDINGS: Postsurgical changes are noted with anterior fixation extending from C4-C7. There is significant increase in prevertebral soft  tissue swelling now measuring up to 4 cm with displacement of the esophagus and airway anteriorly. This would correspond with the patient's given clinical history. These changes likely represent a postoperative hematoma given the recent surgery. CT of the neck may be helpful for further evaluation.   IMPRESSION: Findings most consistent with a prevertebral hematoma. CT of the neck with contrast may be helpful for further evaluation.     Electronically Signed   By: Inez Catalina M.D.   On: 08/01/2022 03:02  I have personally reviewed the images and agree with the above interpretation.  Assessment and Plan: [name] is a pleasant 61 y.o. male with symptoms concerning for significant prevertebral hematoma after anterior cervical procedure.  He is currently stable from a vital sign standpoint, but has developed increasing shortness of breath and has increased work of breathing due to what appears to be increased airway resistance.  On my review of his x-rays, there appears to be some tracheal deviation both anteriorly and laterally.  I do not think it is appropriate to monitor the situation conservatively.  I have recommended return to the OR for clot evacuation.  He and his wife have agreed.  I discussed the planned procedure with the patient, including the risks, benefits, alternatives, and indications. The risks of not proceeding include catastrophic airway failure as a primary cause for concern.  The risks of pursuing the procedure include but are not limited to bleeding, infection, need for reoperation, spinal fluid leak, stroke, vision loss, anesthetic complication, coma, paralysis, and even death.  The patient and his wife expressed understanding of these risks, and asked that we proceed with surgery.   We will proceed to OR on an emergency basis.  If we do not proceed to the operating room, there is considerable risk of loss of limb or life.  As such, I have recommended proceeding  on an emergency basis.  I have communicated my recommendations to the requesting physician and coordinated care to facilitate these recommendations.     Janara Klett K. Izora Ribas MD, [facility] Neurosurgery

## 2022-08-01 NOTE — Transfer of Care (Signed)
Immediate Anesthesia Transfer of Care Note  Patient: [name]  Procedure(s) Performed: CERVICAL WOUND DEBRIDEMENT OF HEMATOMA  Patient Location: PACU  Anesthesia Type:General  Level of Consciousness: awake  Airway & Oxygen Therapy: Patient Spontanous Breathing and Patient connected to face mask oxygen  Post-op Assessment: Report given to RN and Post -op Vital signs reviewed and stable  Post vital signs: Reviewed and stable  Last Vitals:  Vitals Value Taken Time  BP 157/85   Temp    Pulse 103   Resp 13   SpO2 100     Last Pain:  Vitals:   08/01/22 0405  TempSrc: Oral  PainSc:          Complications:  Encounter Notable Events  Notable Event Outcome Phase Comment  Difficult to intubate - expected  Intraprocedure Filed from anesthesia note documentation.

## 2022-08-01 NOTE — Evaluation (Signed)
Physical Therapy Evaluation Patient Details Name: [name] MRN: [id number] DOB: Oct 28, 1960 Today's Date: 08/01/2022  History of Present Illness  Pt is a 61 year old male presenting to the ED with shortness of breath, increased work of breathing and throat swelling. Discharged the day prior (12/12) after C4 thru C7 fusion12/11/23.  Now s/p hematoma excision 12/13.  Clinical Impression  Pt was able to ambulate 350 ft and negotiate up/down steps all w/o direct physical assist, however he did display consistent safety awareness issues and require repeated cuing and guidance to insure appropriate AD use, positioning, etc.  He does have long standing LE weakness and ROM/leg length issues, but displayed ability to manage in-home mobility and gait.  Pt will benefit from continued PT to address safety awareness, and encourage appropriate progression of function post ACDF.       Recommendations for follow up therapy are one component of a multi-disciplinary discharge planning process, led by the attending physician.  Recommendations may be updated based on patient status, additional functional criteria and insurance authorization.  Follow Up Recommendations Follow physician's recommendations for discharge plan and follow up therapies      Assistance Recommended at Discharge Intermittent Supervision/Assistance  Patient can return home with the following  A little help with walking and/or transfers;A little help with bathing/dressing/bathroom;Assistance with cooking/housework;Assist for transportation;Help with stairs or ramp for entrance    Equipment Recommendations None recommended by PT  Recommendations for Other Services       Functional Status Assessment Patient has had a recent decline in their functional status and demonstrates the ability to make significant improvements in function in a reasonable and predictable amount of time.     Precautions / Restrictions  Precautions Precautions: Cervical Restrictions Weight Bearing Restrictions: No      Mobility  Bed Mobility               General bed mobility comments: in recliner pre/post session    Transfers Overall transfer level: Needs assistance Equipment used: Rolling walker (2 wheels) Transfers: Sit to/from Stand Sit to Stand: Supervision           General transfer comment: pt able to phyiscally rise to standing/return to sitting multiple times w/o assist, however needed consistent reminders for hand placement, appropriate walker use/positioning and generally to encourage safety awareness    Ambulation/Gait Ambulation/Gait assistance: Min guard Gait Distance (Feet): 350 Feet Assistive device: Rolling walker (2 wheels)         General Gait Details: Pt with long standing leg length discrepency with noted limp, along with chronic b/l foot drop.  Pt with clear reliance on the walker, no excessive WBing but needed for safety/stability  Stairs Stairs: Yes Stairs assistance: Min guard Stair Management: One rail Left Number of Stairs: 8 General stair comments: R LE leading, sideways with b/l UEs on rail.  Pt able to go up/down 4 steps x 2 w/o direct assist, however did have occasional foot drag issues requiring safety cuing  Wheelchair Mobility    Modified [name] (Stroke Patients Only)       Balance Overall balance assessment: Needs assistance Sitting-balance support: Feet supported, No upper extremity supported Sitting balance-Leahy Scale: Good     Standing balance support: Bilateral upper extremity supported Standing balance-Leahy Scale: Fair Standing balance comment: needing walker for stability                             Pertinent Vitals/Pain Pain  Assessment Pain Assessment: 0-10 Pain Score: 4  Pain Location: neck Pain Descriptors / Indicators: Discomfort    Home Living Family/patient expects to be discharged to:: Private residence Living  Arrangements: Spouse/significant other;Children Available Help at Discharge: Family;Available 24 hours/day Type of Home: House Home Access: Stairs to enter Entrance Stairs-Rails: Right;Left (cannot reach both) Entrance Stairs-Number of Steps: 4 Alternate Level Stairs-Number of Steps: flight Home Layout: Two level;Able to live on main level with bedroom/bathroom Home Equipment: Cane - single point;Rolling Walker (2 wheels)      Prior Function Prior Level of Function : Independent/Modified Independent;Working/employed;Driving             Mobility Comments: Pt independent at household level and community level activity participation despite impaired balance.       Hand Dominance   Dominant Hand: Right    Extremity/Trunk Assessment   Upper Extremity Assessment Upper Extremity Assessment: Overall WFL for tasks assessed;Generalized weakness RUE Deficits / Details: strength not formally tested, pt able to functionally perform toilet transfer with distant supervision (as OT was starting eval), grip strength mildy impaired; pt reports no nubness/tingling throughout BUE    Lower Extremity Assessment Lower Extremity Assessment: Generalized weakness (chronic b/l knee ROM limitations, foot drop, LLD - functional t/o, near baseline)    Cervical / Trunk Assessment Cervical / Trunk Assessment: Neck Surgery  Communication   Communication: No difficulties  Cognition Arousal/Alertness: Awake/alert Behavior During Therapy: WFL for tasks assessed/performed Overall Cognitive Status: Within Functional Limits for tasks assessed Area of Impairment: Safety/judgement, Problem solving                         Safety/Judgement: Decreased awareness of deficits   Problem Solving: Requires verbal cues General Comments: Pt tending to want to overthink and therefore complicate some aspects of mobility, AD use, etc - however PT able to redirect and educate on appropriate use and strategies         General Comments General comments (skin integrity, edema, etc.): O2 in the 90s on room air t/o the session    Exercises     Assessment/Plan    PT Assessment    PT Problem List Decreased strength;Decreased range of motion;Decreased activity tolerance;Decreased balance;Decreased safety awareness;Pain;Decreased knowledge of precautions;Decreased mobility;Decreased knowledge of use of DME       PT Treatment Interventions Gait training;Stair training;Balance training;Neuromuscular re-education;Therapeutic activities;Therapeutic exercise;Patient/family education    PT Goals (Current goals can be found in the Care Plan section)  Acute Rehab PT Goals Patient Stated Goal: go home PT Goal Formulation: With patient Time For Goal Achievement: 08/14/22 Potential to Achieve Goals: Good    Frequency Min 2X/week     Co-evaluation               AM-PAC PT "6 Clicks" Mobility  Outcome Measure Help needed turning from your back to your side while in a flat bed without using bedrails?: None Help needed moving from lying on your back to sitting on the side of a flat bed without using bedrails?: None Help needed moving to and from a bed to a chair (including a wheelchair)?: None Help needed standing up from a chair using your arms (e.g., wheelchair or bedside chair)?: A Little Help needed to walk in hospital room?: A Little Help needed climbing 3-5 steps with a railing? : A Little 6 Click Score: 21    End of Session Equipment Utilized During Treatment: Gait belt Activity Tolerance: Patient tolerated treatment well Patient left:  in chair;with call bell/phone within reach Nurse Communication: Mobility status PT Visit Diagnosis: History of falling (Z91.81);Pain;Muscle weakness (generalized) (M62.81)    Time: 7408-1448 PT Time Calculation (min) (ACUTE ONLY): 40 min   Charges:   PT Evaluation $PT Eval Low Complexity: 1 Low PT Treatments $Gait Training: 8-22 mins         Kreg Shropshire, DPT 08/01/2022, 5:39 PM

## 2022-08-01 NOTE — Interval H&P Note (Signed)
History and Physical Interval Note:  08/01/2022 5:03 AM  [name]  has presented today for surgery, with the diagnosis of hematoma.  The various methods of treatment have been discussed with the patient and family. After consideration of risks, benefits and other options for treatment, the patient has consented to  Procedure(s): CERVICAL WOUND DEBRIDEMENT (N/A) as a surgical intervention.  The patient's history has been reviewed, patient examined, no change in status, stable for surgery.  I have reviewed the patient's chart and labs.  Questions were answered to the patient's satisfaction.    Heart sounds normal no MRG. Chest Clear to Auscultation Bilaterally.   [name]

## 2022-08-01 NOTE — ED Provider Notes (Signed)
[facility] Provider Note    Event Date/Time   First MD Initiated Contact with Patient 08/01/22 0210     (approximate)   History   Post-op Problem (Suregry x 12 hrs ago, having, Bleeding  and vocal change )   HPI  [name] is a 61 y.o. male  who presents to the ED from home with a chief complaint of sob, neck swelling, post-op bleeding. Patient had neck surgery and discharged from the hospital 12 hours ago. Awoke feeling short of breath, noted operative site swelling and change to his voice. Coughed forcefully and noted saturation to the dressing covering the site of the drain which was pulled prior to discharge. Denies fever, chest pain, abdominal pain, nausea, vomiting or dizziness.      Past Medical History   Past Medical History:  Diagnosis Date   ADHD    Arthritis    Concussion    COVID 12/2020   Hypertension    Multiple lacunar infarcts (HCC)    MVP (mitral valve prolapse)    Pernicious anemia    [facility] spotted fever    Spinal stenosis    Vestibular migraine    Vitamin B12 deficiency    Vitamin D deficiency      Active Problem List   Patient Active Problem List   Diagnosis Date Noted   Cervical myelopathy ([place]) 07/30/2022   Cervical spinal stenosis 07/30/2022   Radiculopathy, cervical 07/30/2022   S/P cervical spinal fusion 07/30/2022   Vitamin D deficiency 06/25/2022   B12 deficiency 06/25/2022   Elevated LDL cholesterol level 06/25/2022   Prediabetes 03/27/2021   History of nausea- reported 01/27/2020 02/02/2020   Accidental fall from ladder 02/02/2020   Acute right-sided thoracic back pain 02/02/2020   Neck stiffness 02/02/2020   Concussion with no loss of consciousness 02/02/2020   Multiple falls 02/02/2020   Head trauma, initial encounter 02/02/2020   Acute post-traumatic headache, not intractable 02/02/2020   Essential hypertension 08/26/2018   Pre-bariatric surgery nutrition evaluation 06/19/2018    Migraine with vertigo 01/05/2014   Arthralgia of multiple joints 04/28/2009   Carpal tunnel syndrome 08/30/2008     Past Surgical History   Past Surgical History:  Procedure Laterality Date   BILATERAL CARPAL TUNNEL RELEASE     FRACTURE SURGERY Left 1984   intramedullary rod   hip bone transplant  [place] Left    oseotomy   REPAIR ANKLE LIGAMENT Right 1978   REPAIR KNEE LIGAMENT Bilateral    acl   TRIGGER FINGER RELEASE Bilateral    thumbs     Home Medications   Prior to Admission medications   Medication Sig Start Date End Date Taking? Authorizing Provider  ALPHA LIPOIC ACID PO Take 600 mg by mouth daily.    [provider]  Cholecalciferol (VITAMIN D3 PO) Take 5,000 Int'l Units by mouth daily.    [provider]  Cyanocobalamin (VITAMIN B 12 PO) Take 5,000 mcg by mouth daily. W/ folate 1700 mcg    [provider]  cyanocobalamin (VITAMIN B12) 1000 MCG/ML injection Inject into the muscle. 06/04/22 10/30/22  [provider]  lisinopril (ZESTRIL) 10 MG tablet Take 1 tablet (10 mg total) by mouth daily. 07/26/22   Jon Billings, NP  methocarbamol (ROBAXIN) 500 MG tablet Take 1 tablet (500 mg total) by mouth every 6 (six) hours as needed for muscle spasms. 07/31/22   Loleta Dicker, PA  Multiple Vitamin (MULTIVITAMIN) tablet Take  1 tablet by mouth daily.    [provider]  oxyCODONE (OXY IR/ROXICODONE) 5 MG immediate release tablet Take 1 tablet (5 mg total) by mouth every 4 (four) hours as needed for moderate pain or severe pain ((score 4 to 6)). 07/31/22   Loleta Dicker, PA  rizatriptan (MAXALT) 10 MG tablet Take 1 tablet (10 mg total) by mouth as needed for migraine. May repeat in 2 hours if needed 08/02/21   Jon Billings, NP  senna (SENOKOT) 8.6 MG TABS tablet Take 1 tablet (8.6 mg total) by mouth daily as needed for mild constipation. 07/31/22   Loleta Dicker, PA     Allergies  Patient has no  known allergies.   Family History   Family History  Problem Relation Age of Onset   Breast cancer Mother    Thyroid cancer Mother    Lymphoma Father    CVA Father    Heart attack Sister    Hypertension Sister      Physical Exam  Triage Vital Signs: ED Triage Vitals [08/01/22 0209]  Enc Vitals Group     BP      Pulse      Resp      Temp      Temp src      SpO2      Weight      Height      Head Circumference      Peak Flow      Pain Score 10     Pain Loc      Pain Edu?      Excl. in [place]?     Updated Vital Signs: BP (!) 155/95   Pulse 81   Temp 97.6 F (36.4 C) (Oral)   Resp 11   SpO2 97%    General: Awake, moderate distress. Anxious. CV:  Tachycardic. Good peripheral perfusion.  Resp:  Normal effort. CTAB. Abd:  Nontender. No distention.  Other:  Anterior neck mildly swollen. Operative site clean/dry/intact. Mildly raspy voice. No posterior oropharyngeal swelling. Tolerating secretions well. Site of drain removal with tiny puncture mark with venous oozing.   ED Results / Procedures / Treatments  Labs (all labs ordered are listed, but only abnormal results are displayed) Labs Reviewed  CBC WITH DIFFERENTIAL/PLATELET - Abnormal; Notable for the following components:      Result Value   WBC 13.4 (*)    Neutro Abs 8.9 (*)    All other components within normal limits  BRAIN NATRIURETIC PEPTIDE  BASIC METABOLIC PANEL     EKG  None   RADIOLOGY I have independently visualized and interpreted patient's xrays as well as noted the radiology interpretation:  CXR: No acute cardiopulmonary process  Cervical Spine xray: Postoperative hematoma  Official radiology report(s): DG Cervical Spine Complete  Result Date: 08/01/2022 CLINICAL DATA:  Recent cervical surgery with difficulty swallowing and increased bleeding, initial encounter EXAM: CERVICAL SPINE - COMPLETE 4+ VIEW COMPARISON:  Intraoperative films from the previous day. FINDINGS: Postsurgical  changes are noted with anterior fixation extending from C4-C7. There is significant increase in prevertebral soft tissue swelling now measuring up to 4 cm with displacement of the esophagus and airway anteriorly. This would correspond with the patient's given clinical history. These changes likely represent a postoperative hematoma given the recent surgery. CT of the neck may be helpful for further evaluation. IMPRESSION: Findings most consistent with a prevertebral hematoma. CT of the neck with contrast may be helpful for further evaluation. Electronically Signed  By: Inez Catalina M.D.   On: 08/01/2022 03:02   DG Chest Port 1 View  Result Date: 08/01/2022 CLINICAL DATA:  Shortness of breath, history of recent cervical surgery with difficulty swallowing EXAM: PORTABLE CHEST 1 VIEW COMPARISON:  02/02/2020 FINDINGS: Cardiac shadow is within normal limits. Aortic calcifications are seen. Lungs are well aerated bilaterally. Bony structures are within normal limits. IMPRESSION: No active disease. Electronically Signed   By: Inez Catalina M.D.   On: 08/01/2022 03:01     PROCEDURES:  Critical Care performed: Yes, see critical care procedure note(s)  CRITICAL CARE Performed by: Paulette Blanch   Total critical care time: 30 minutes  Critical care time was exclusive of separately billable procedures and treating other patients.  Critical care was necessary to treat or prevent imminent or life-threatening deterioration.  Critical care was time spent personally by me on the following activities: development of treatment plan with patient and/or surrogate as well as nursing, discussions with consultants, evaluation of patient's response to treatment, examination of patient, obtaining history from patient or surrogate, ordering and performing treatments and interventions, ordering and review of laboratory studies, ordering and review of radiographic studies, pulse oximetry and re-evaluation of patient's  condition.   [name]1-3 Lead EKG Interpretation  Performed by: Paulette Blanch, MD Authorized by: Paulette Blanch, MD     Interpretation: abnormal     ECG rate:  117   ECG rate assessment: tachycardic     Rhythm: sinus tachycardia     Ectopy: none     Conduction: normal   Comments:     Patient placed on cardiac monitor to evaluate for arrthymias    MEDICATIONS ORDERED IN ED: Medications  sodium chloride 0.9 % bolus 500 mL (0 mLs Intravenous Stopped 08/01/22 0319)  dexamethasone (DECADRON) injection 10 mg (10 mg Intravenous Given 08/01/22 0238)  morphine (PF) 4 MG/ML injection 4 mg (4 mg Intravenous Given 08/01/22 0247)  ondansetron (ZOFRAN) injection 4 mg (4 mg Intravenous Given 08/01/22 0247)  sodium chloride (OCEAN) 0.65 % nasal spray 1 spray (1 spray Each Nare Given 08/01/22 0309)  LORazepam (ATIVAN) injection 0.5 mg (0.5 mg Intravenous Given 08/01/22 0319)     IMPRESSION / MDM / ASSESSMENT AND PLAN / ED COURSE  I reviewed the triage vital signs and the nursing notes.                             61 year old male presenting with post-operative swelling and bleeding from drain site. Differential diagnosis includes but is not limited to post-operative hematoma, active hemorrhage, sepsis, pneumonia, etc. I have personally reviewed patient's records and note his recent operative note from 07/30/2022.  Patient's presentation is most consistent with acute presentation with potential threat to life or bodily function.  The patient is on the cardiac monitor to evaluate for evidence of arrhythmia and/or significant heart rate changes.  Will obtain labwork, chest xray. Surgicell and gauze applied to venous oozing site. Will discuss with neurosurgery.  Clinical Course as of 08/01/22 0441  Wed Aug 01, 2022  0215 Spoke with Dr. Izora Ribas who recommends keeping patient NPO, dose of steroids, cervical spine xray and will evaluate patient in the ED. [JS]  [id number] Dr. Izora Ribas at bedside evaluating  patient. Plan to take him back to the OR for hematoma evacuation. [JS]    Clinical Course User Index [JS] Paulette Blanch, MD     FINAL CLINICAL IMPRESSION(S) / ED  DIAGNOSES   Final diagnoses:  Postoperative hematoma of musculoskeletal structure following musculoskeletal procedure  Post-operative pain  Hematoma of neck, initial encounter     Rx / DC Orders   ED Discharge Orders     None        Note:  This document was prepared using Dragon voice recognition software and may include unintentional dictation errors.   Paulette Blanch, MD 08/01/22 [phone]

## 2022-08-01 NOTE — Anesthesia Procedure Notes (Signed)
Procedure Name: Intubation Date/Time: 08/01/2022 5:26 AM  Performed by: Aline Brochure, CRNAPre-anesthesia Checklist: Patient identified, Patient being monitored, Timeout performed, Emergency Drugs available and Suction available Patient Re-evaluated:Patient Re-evaluated prior to induction Oxygen Delivery Method: Circle system utilized Preoxygenation: Pre-oxygenation with 100% oxygen Induction Type: IV induction and Rapid sequence Laryngoscope Size: McGraph and 4 Grade View: Grade II Tube type: Oral Tube size: 6.5 mm Number of attempts: 1 Airway Equipment and Method: Stylet and Video-laryngoscopy Placement Confirmation: ETT inserted through vocal cords under direct vision, positive ETCO2 and breath sounds checked- equal and bilateral Secured at: 21 cm Tube secured with: Tape Dental Injury: Teeth and Oropharynx as per pre-operative assessment  Difficulty Due To: Difficulty was anticipated, Difficult Airway- due to anterior larynx and Difficult Airway-  due to edematous airway

## 2022-08-01 NOTE — Evaluation (Addendum)
Occupational Therapy Evaluation Patient Details Name: [name] MRN: [id number] DOB: 1960-11-23 Today's Date: 08/01/2022   History of Present Illness Pt is a 61 year old male s/p exploration and removal of hemotoma after presenting to the ED with increasing shortness of breath and has increased work of breathing. Pt is s/p C4 thru C7 fusion with ant approach 07/30/22, discharged 07/31/22;   Clinical Impression   Chart reviewed, RN cleared pt for participation in OT evaluation. Pt is greeted in bathroom, alert and oriented x4, fair awareness of current level of deficits/safety awareness. Of note, pt also recently received Ativan. Pt wife present throughout evaluation. Pt was home for a short period of time after discharge yesterday 12/12 however pt reports he was able to perform LB dressing with supervision, was sleeping much of the time. Pt presents with deficits in strength, endurance, activity tolerance, balance, affecting safe and optimal ADL completion. Educated pt and wife re: precautions, safe ADL completion, DME use, falls prevention. They report he was supposed to start [facility] therapy today. Recommend discharge with [place]. OT will continue to follow acutely.      Recommendations for follow up therapy are one component of a multi-disciplinary discharge planning process, led by the attending physician.  Recommendations may be updated based on patient status, additional functional criteria and insurance authorization.   Follow Up Recommendations  Home health OT     Assistance Recommended at Discharge Intermittent Supervision/Assistance  Patient can return home with the following A little help with bathing/dressing/bathroom;Assist for transportation    Functional Status Assessment  Patient has had a recent decline in their functional status and demonstrates the ability to make significant improvements in function in a reasonable and predictable amount of time.  Equipment  Recommendations  None recommended by OT (RW)    Recommendations for Other Services       Precautions / Restrictions Precautions Precautions: Cervical Restrictions Weight Bearing Restrictions: No      Mobility Bed Mobility               General bed mobility comments: NT- in bathroom pre session/recliner post session    Transfers Overall transfer level: Needs assistance Equipment used: Rolling walker (2 wheels) Transfers: Sit to/from Stand Sit to Stand: Supervision                  Balance Overall balance assessment: Needs assistance Sitting-balance support: Feet supported, No upper extremity supported Sitting balance-Leahy Scale: Good     Standing balance support: Bilateral upper extremity supported Standing balance-Leahy Scale: Fair Standing balance comment: Fair-good                           ADL either performed or assessed with clinical judgement   ADL Overall ADL's : Needs assistance/impaired     Grooming: Wash/dry hands;Standing;Supervision/safety Grooming Details (indicate cue type and reason): sink level             Lower Body Dressing: Maximal assistance   Toilet Transfer: Supervision/safety;Min guard;Rolling walker (2 wheels);BSC/3in1;Ambulation;Cueing for safety   Toileting- Clothing Manipulation and Hygiene: Supervision/safety       Functional mobility during ADLs: Supervision/safety;Rolling walker (2 wheels);Min guard;Cueing for safety (approx 20' from bathroom, intermittent vcs for safety)       Vision Patient Visual Report: No change from baseline       Perception     Praxis      Pertinent Vitals/Pain Pain Assessment Pain Assessment: Faces Faces Pain  Scale: Hurts a little bit Pain Location: neck Pain Descriptors / Indicators: Discomfort Pain Intervention(s): Limited activity within patient's tolerance, Monitored during session, Repositioned     Hand Dominance Right   Extremity/Trunk Assessment Upper  Extremity Assessment Upper Extremity Assessment: RUE deficits/detail;LUE deficits/detail RUE Deficits / Details: strength not formally tested, pt able to functionally perform toilet transfer with distant supervision (as OT was starting eval), grip strength mildy impaired; pt reports no nubness/tingling throughout BUE   Lower Extremity Assessment Lower Extremity Assessment: Generalized weakness (reports tingling throughout BLE, unchanged from either surgeries)   Cervical / Trunk Assessment Cervical / Trunk Assessment: Neck Surgery   Communication Communication Communication: No difficulties   Cognition Arousal/Alertness: Awake/alert Behavior During Therapy: WFL for tasks assessed/performed Overall Cognitive Status: Within Functional Limits for tasks assessed Area of Impairment: Problem solving, Safety/judgement                         Safety/Judgement: Decreased awareness of deficits   Problem Solving: Requires verbal cues       General Comments  vital signs monitored, spo2 >90% throughout on RA, HR up to 110 with mobility    Exercises Other Exercises Other Exercises: edu re: role of OT, recommendations, use of DME, falls prevention, safe ADL completion, precautions   Shoulder Instructions      Home Living Family/patient expects to be discharged to:: Private residence Living Arrangements: Spouse/significant other;Children Available Help at Discharge: Family;Available 24 hours/day Type of Home: House Home Access: Stairs to enter [employer] of Steps: 4 Entrance Stairs-Rails: Can reach both Home Layout: Two level;Able to live on main level with bedroom/bathroom Alternate Level Stairs-Number of Steps: flight Alternate Level Stairs-Rails: Right;Left;Can reach both Bathroom Shower/Tub: Occupational psychologist: Standard     Home Equipment: Cane - single point          Prior Functioning/Environment Prior Level of Function :  Independent/Modified Independent;Working/employed;Driving                        OT Problem List: Decreased strength;Decreased range of motion;Decreased activity tolerance;Impaired balance (sitting and/or standing);Impaired UE functional use;Decreased knowledge of precautions      OT Treatment/Interventions: Self-care/ADL training;DME and/or AE instruction;Therapeutic exercise;Patient/family education;Therapeutic activities    OT Goals(Current goals can be found in the care plan section) Acute Rehab OT Goals Patient Stated Goal: get stronger OT Goal Formulation: With patient/family Time For Goal Achievement: 08/15/22 Potential to Achieve Goals: Good ADL Goals Pt Will Perform Grooming: with modified independence;sitting;standing Pt Will Perform Lower Body Dressing: with modified independence;with adaptive equipment Pt Will Transfer to Toilet: with modified independence Pt Will Perform Toileting - Clothing Manipulation and hygiene: with modified independence  OT Frequency: Min 2X/week    Co-evaluation              AM-PAC OT "6 Clicks" Daily Activity     Outcome Measure Help from another person eating meals?: None Help from another person taking care of personal grooming?: A Little Help from another person toileting, which includes using toliet, bedpan, or urinal?: A Little Help from another person bathing (including washing, rinsing, drying)?: A Little Help from another person to put on and taking off regular upper body clothing?: A Little Help from another person to put on and taking off regular lower body clothing?: A Lot 6 Click Score: 18   End of Session Equipment Utilized During Treatment: Rolling walker (2 wheels) Nurse Communication: Mobility status  Activity Tolerance: Patient tolerated treatment well Patient left: in chair;with call bell/phone within reach;with family/visitor present  OT Visit Diagnosis: Unsteadiness on feet (R26.81);Repeated falls  (R29.6);Muscle weakness (generalized) (M62.81);History of falling (Z91.81)                Time: 1425-1440 OT Time Calculation (min): 15 min Charges:  OT General Charges $OT Visit: 1 Visit OT Evaluation $OT Eval Low Complexity: 1 Low  Shanon Payor, OTD OTR/L  08/01/22, 3:15 PM

## 2022-08-01 NOTE — Op Note (Signed)
Indications: [name] presented back to the hospital after he was discharged yesterday.  He underwent an anterior cervical discectomy and fusion on Monday.  He noted increased difficulty with breathing and swelling in his neck.  He was diagnosed with a postoperative hematoma.  Due to impending respiratory compromise, surgery was recommended on an emergent basis.     Findings: Bleeding from a very small artery on the surface of the left longus coli muscle  Preoperative Diagnosis: Posterior productive hematoma from musculoskeletal procedure Postoperative Diagnosis: same   EBL: 25 ml IVF: see AR ml Drains: 1 placed Disposition: Extubated and Stable to PACU Complications: none  No foley catheter was placed.   Preoperative Note:   Risks of surgery discussed include: infection, bleeding, stroke, coma, death, paralysis, CSF leak, nerve/spinal cord injury, numbness, tingling, weakness, vascular injury, need for further surgery, persistent symptoms, development of deformity, and the risks of anesthesia. The patient understood these risks and agreed to proceed.  Operative Note:   Procedure:  1) Exploration of anterior neck wound   Procedure: After obtaining informed consent, the patient taken to the operating room, placed in supine position, general anesthesia induced.   The prior incision was identified and inspected.  The Dermabond was removed.  The area was then prepped and draped in standard fashion.  Preoperative antibiotics were given.  A timeout was performed.  The prior incision was opened sharply.  The platysma was opened.  Hematoma was encountered.  This was then explored and removed in piecemeal fashion.  Blunt dissection was used to separate the hematoma from the soft tissues.  Once the vast majority of the hematoma had been evacuated, the plate was identified and inspected.  There was no evidence of complicating feature around the anterior cervical plate.  The area was  copiously irrigated and a small area of active bleeding was noted on the left longus coli muscle.  This was coagulated until no bleeding was identified.  The rest of the incision was inspected and a small area of venous oozing was noted inferiorly as well as on the medial wall of the operative site.  These were both coagulated.  No active bleeding was then noted.  A drain was placed.  Hemostasis was then confirmed additionally.  At this point, attention was paid to closure.  The platysma and dermis were closed using 3-0 Vicryl.  Dermabond was placed on the skin.  The drain was secured using 3-0 nylon.  Sponge and pattie counts were correct at the end of the procedure.   I performed the entire procedure.   Meade Maw MD

## 2022-08-01 NOTE — Anesthesia Preprocedure Evaluation (Signed)
Anesthesia Evaluation  Patient identified by MRN, date of birth, ID band Patient awake    Reviewed: Allergy & Precautions, H&P , NPO status , Patient's Chart, lab work & pertinent test results, reviewed documented beta blocker date and time   Airway Mallampati: III  TM Distance: >3 FB Neck ROM: full    Dental  (+) Teeth Intact   Pulmonary neg pulmonary ROS   Pulmonary exam normal        Cardiovascular Exercise Tolerance: Poor hypertension, On Medications negative cardio ROS  Rhythm:regular Rate:Tachycardia     Neuro/Psych  Headaches PSYCHIATRIC DISORDERS       Neuromuscular disease    GI/Hepatic negative GI ROS, Neg liver ROS,,,  Endo/Other  negative endocrine ROS    Renal/GU negative Renal ROS  negative genitourinary   Musculoskeletal   Abdominal Normal abdominal exam  (+)   Peds  Hematology  (+) Blood dyscrasia, anemia   Anesthesia Other Findings Past Medical History: No date: ADHD No date: Arthritis No date: Concussion 12/2020: COVID No date: Hypertension No date: Multiple lacunar infarcts (HCC) No date: MVP (mitral valve prolapse) No date: Pernicious anemia No date: [facility] spotted fever No date: Spinal stenosis No date: Vestibular migraine No date: Vitamin B12 deficiency No date: Vitamin D deficiency Past Surgical History: No date: BILATERAL CARPAL TUNNEL RELEASE 1984: FRACTURE SURGERY; Left     Comment:  intramedullary rod 1984: hip bone transplant No date: LEG SURGERY; Left     Comment:  oseotomy 1978: REPAIR ANKLE LIGAMENT; Right No date: REPAIR KNEE LIGAMENT; Bilateral     Comment:  acl No date: TRIGGER FINGER RELEASE; Bilateral     Comment:  thumbs BMI    Body Mass Index: 24.20 kg/m     Reproductive/Obstetrics negative OB ROS                             Anesthesia Physical Anesthesia Plan  ASA: 3 and emergent  Anesthesia Plan: General ETT    Post-op Pain Management: Tylenol PO (pre-op)* and Dilaudid IV   Induction: Intravenous  PONV Risk Score and Plan: 3  Airway Management Planned: Oral ETT  Additional Equipment:   Intra-op Plan:   Post-operative Plan: Extubation in OR  Informed Consent: I have reviewed the patients History and Physical, chart, labs and discussed the procedure including the risks, benefits and alternatives for the proposed anesthesia with the patient or authorized representative who has indicated his/her understanding and acceptance.     Dental Advisory Given  Plan Discussed with: CRNA  Anesthesia Plan Comments:        Anesthesia Quick Evaluation

## 2022-08-02 ENCOUNTER — Encounter: Payer: Self-pay | Admitting: Neurosurgery

## 2022-08-02 DIAGNOSIS — I341 Nonrheumatic mitral (valve) prolapse: Secondary | ICD-10-CM | POA: Diagnosis present

## 2022-08-02 DIAGNOSIS — E559 Vitamin D deficiency, unspecified: Secondary | ICD-10-CM | POA: Diagnosis present

## 2022-08-02 DIAGNOSIS — F909 Attention-deficit hyperactivity disorder, unspecified type: Secondary | ICD-10-CM | POA: Diagnosis present

## 2022-08-02 DIAGNOSIS — Z8619 Personal history of other infectious and parasitic diseases: Secondary | ICD-10-CM | POA: Diagnosis not present

## 2022-08-02 DIAGNOSIS — G8918 Other acute postprocedural pain: Secondary | ICD-10-CM | POA: Diagnosis present

## 2022-08-02 DIAGNOSIS — E538 Deficiency of other specified B group vitamins: Secondary | ICD-10-CM | POA: Diagnosis present

## 2022-08-02 DIAGNOSIS — R1310 Dysphagia, unspecified: Secondary | ICD-10-CM | POA: Diagnosis present

## 2022-08-02 DIAGNOSIS — M96840 Postprocedural hematoma of a musculoskeletal structure following a musculoskeletal system procedure: Secondary | ICD-10-CM | POA: Diagnosis present

## 2022-08-02 DIAGNOSIS — I10 Essential (primary) hypertension: Secondary | ICD-10-CM | POA: Diagnosis present

## 2022-08-02 DIAGNOSIS — J398 Other specified diseases of upper respiratory tract: Secondary | ICD-10-CM | POA: Diagnosis present

## 2022-08-02 DIAGNOSIS — Z8616 Personal history of COVID-19: Secondary | ICD-10-CM | POA: Diagnosis not present

## 2022-08-02 DIAGNOSIS — R0602 Shortness of breath: Secondary | ICD-10-CM | POA: Diagnosis present

## 2022-08-02 DIAGNOSIS — Y839 Surgical procedure, unspecified as the cause of abnormal reaction of the patient, or of later complication, without mention of misadventure at the time of the procedure: Secondary | ICD-10-CM | POA: Diagnosis present

## 2022-08-02 DIAGNOSIS — Y838 Other surgical procedures as the cause of abnormal reaction of the patient, or of later complication, without mention of misadventure at the time of the procedure: Secondary | ICD-10-CM | POA: Diagnosis present

## 2022-08-02 DIAGNOSIS — Z981 Arthrodesis status: Secondary | ICD-10-CM | POA: Diagnosis not present

## 2022-08-02 MED ORDER — ACETAMINOPHEN 500 MG PO TABS
ORAL_TABLET | ORAL | Status: AC
  Administered 2022-08-02: 1000 mg via ORAL
  Filled 2022-08-02: qty 2

## 2022-08-02 MED ORDER — DEXAMETHASONE SODIUM PHOSPHATE 10 MG/ML IJ SOLN
INTRAMUSCULAR | Status: AC
  Filled 2022-08-02: qty 1

## 2022-08-02 MED ORDER — CELECOXIB 200 MG PO CAPS
ORAL_CAPSULE | ORAL | Status: AC
  Administered 2022-08-02: 200 mg
  Filled 2022-08-02: qty 1

## 2022-08-02 MED ORDER — LISINOPRIL 5 MG PO TABS
ORAL_TABLET | ORAL | Status: AC
  Administered 2022-08-02: 10 mg
  Filled 2022-08-02: qty 2

## 2022-08-02 MED ORDER — DEXAMETHASONE SODIUM PHOSPHATE 10 MG/ML IJ SOLN
4.0000 mg | Freq: Four times a day (QID) | INTRAMUSCULAR | Status: DC
  Administered 2022-08-02 – 2022-08-03 (×5): 4 mg via INTRAVENOUS
  Filled 2022-08-02 (×4): qty 0.4

## 2022-08-02 MED ORDER — DEXAMETHASONE 4 MG PO TABS
4.0000 mg | ORAL_TABLET | Freq: Four times a day (QID) | ORAL | Status: DC
  Filled 2022-08-02: qty 1

## 2022-08-02 NOTE — Anesthesia Postprocedure Evaluation (Signed)
Anesthesia Post Note  Patient: [name]  Procedure(s) Performed: CERVICAL WOUND DEBRIDEMENT OF HEMATOMA  Patient location during evaluation: PACU Anesthesia Type: General Level of consciousness: awake and alert Pain management: pain level controlled Vital Signs Assessment: post-procedure vital signs reviewed and stable Respiratory status: spontaneous breathing, nonlabored ventilation, respiratory function stable and patient connected to nasal cannula oxygen Cardiovascular status: blood pressure returned to baseline and stable Postop Assessment: no apparent nausea or vomiting Anesthetic complications: yes   Encounter Notable Events  Notable Event Outcome Phase Comment  Difficult to intubate - expected  Intraprocedure Filed from anesthesia note documentation.     Last Vitals:  Vitals:   08/01/22 1651 08/02/22 0832  BP: 115/83 (!) 153/90  Pulse: (!) 103 80  Resp: 18 18  Temp: 37.2 C 36.4 C  SpO2: 97% 98%    Last Pain:  Vitals:   08/02/22 0832  TempSrc: Temporal  PainSc:                  [name]

## 2022-08-02 NOTE — Progress Notes (Signed)
Patient was transferred to [facility]  to room 146. VSS stable. No signs of acute distress

## 2022-08-02 NOTE — Progress Notes (Signed)
   08/02/22 1100  Clinical Encounter Type  Visited With Patient and family together  Visit Type Initial;Social support  Referral From Family  Consult/Referral To Chaplain   Chaplain received call from wife of patient to provide social support to family.

## 2022-08-02 NOTE — Progress Notes (Signed)
    Attending Progress Note  History: [name] is s/p C4-7 ACDF for myelopathy and radiculopathy. He re-presented with a post-operative hematoma.  POD1: Doing much better  Physical Exam: Vitals:   08/01/22 1651 08/02/22 0832  BP: 115/83 (!) 153/90  Pulse: (!) 103 80  Resp: 18 18  Temp: 99 F (37.2 C) 97.6 F (36.4 C)  SpO2: 97% 98%    AA Ox3 CNI Strength: MAEW 5/5  Neck soft   Incision c/d/I with dermabond in place JP output 40 since surgery  Data:  Other tests/results: none  Assessment/Plan:  [name] is a 61 y.o s/p ACDF for myelopathy and radiculopathy, complicated by post-operative hematoma.  - mobilize - pain control - DVT prophylaxis - PTOT - JP continue for now - PO steroids for throat swelling  Meade Maw MD Department of Neurosurgery

## 2022-08-02 NOTE — TOC Progression Note (Signed)
Transition of Care [facility]) - Progression Note    Patient Details  Name: [name] MRN: [id number] Date of Birth: 08/18/1961  Transition of [facility] CM/SW [place], RN Phone Number: 08/02/2022, 10:37 AM  Clinical Narrative:     Patient is open with [facility] prior to Surgery for [facility]       Expected Discharge Plan and Services                                                 Social Determinants of Health (SDOH) Interventions    Readmission Risk Interventions     No data to display

## 2022-08-02 NOTE — Progress Notes (Signed)
Physical Therapy Treatment Patient Details Name: [name] MRN: [id number] DOB: 01-20-1961 Today's Date: 08/02/2022   History of Present Illness Pt is a 61 year old male presenting to the ED with shortness of breath, increased work of breathing and throat swelling. Discharged the day prior (12/12) after C4 - 7 fusion12/11/23.  Now s/p hematoma excision 12/13.    PT Comments    Pt again pleasant and engaged with PT session.  He continues to need some safety awareness reinforcement and cuing but moves well and did not need direct physical assist with any aspect of mobility, gait, etc.  He showed increased speed and confidence with ambulation (with RW) and showed better execution on the steps with less need for VCs.  Pt doing well, hopes to d/c home tomorrow, will maintain on PT caseload while here.  Recommendations for follow up therapy are one component of a multi-disciplinary discharge planning process, led by the attending physician.  Recommendations may be updated based on patient status, additional functional criteria and insurance authorization.  Follow Up Recommendations  Follow physician's recommendations for discharge plan and follow up therapies     Assistance Recommended at Discharge Intermittent Supervision/Assistance  Patient can return home with the following A little help with walking and/or transfers;A little help with bathing/dressing/bathroom;Assistance with cooking/housework;Assist for transportation;Help with stairs or ramp for entrance   Equipment Recommendations  None recommended by PT    Recommendations for Other Services       Precautions / Restrictions Precautions Precautions: Cervical Restrictions Weight Bearing Restrictions: No     Mobility  Bed Mobility Overal bed mobility: Independent             General bed mobility comments: seated pre/post session    Transfers Overall transfer level: Needs assistance Equipment used: Rolling walker  (2 wheels) Transfers: Sit to/from Stand Sit to Stand: Supervision           General transfer comment: similar to yesterday, able to  stand/return to sitting multiple times w/o assist, however needing consistent reminders for hand placement, appropriate walker use/positioning and generally to encourage safety awareness    Ambulation/Gait Ambulation/Gait assistance: Supervision Gait Distance (Feet): 250 Feet Assistive device: Rolling walker (2 wheels)         General Gait Details: Pt with long standing leg length discrepency with noted limp, along with chronic b/l foot drop.  Reliance on the walker, no excessive WBing but needed for safety/stability.   Stairs Stairs: Yes Stairs assistance: Min guard Stair Management: One rail Left Number of Stairs: 4 General stair comments: R LE leading, sideways with b/l UEs on rail.  Pt able to go up/down 4 steps w/o direct assist, minimal cuing.  Did not have foot dragging issues this date and with better positional/set up awareness this date   Wheelchair Mobility    Modified [name] (Stroke Patients Only)       Balance Overall balance assessment: Needs assistance Sitting-balance support: Feet supported, No upper extremity supported Sitting balance-Leahy Scale: Good     Standing balance support: Bilateral upper extremity supported Standing balance-Leahy Scale: Good Standing balance comment: able to maintain static standing w/o UEs but needing AD for dynamic standing/ambulation                            Cognition Arousal/Alertness: Awake/alert Behavior During Therapy: WFL for tasks assessed/performed Overall Cognitive Status: Within Functional Limits for tasks assessed  General Comments: Pt some mild impulsivity and continues to want to overthink, and therefore complicate, some aspects of mobility, AD use, etc - however PT again able to redirect and educate on appropriate  use and strategies        Exercises      General Comments General comments (skin integrity, edema, etc.): Pt moves with confidence and does not need physical assit, needs cuing to insure he remains focused and aware of safety but ultimately he does move well, has baseline issues that alter gait, etc w/o      Pertinent Vitals/Pain Pain Assessment Faces Pain Scale: Hurts a little bit Pain Location: pt reports that neck pain/tightness is improved from yesterday, still tender    Home Living                          Prior Function            PT Goals (current goals can now be found in the care plan section) Progress towards PT goals: Progressing toward goals    Frequency    Min 2X/week      PT Plan Current plan remains appropriate    Co-evaluation              AM-PAC PT "6 Clicks" Mobility   Outcome Measure  Help needed turning from your back to your side while in a flat bed without using bedrails?: None Help needed moving from lying on your back to sitting on the side of a flat bed without using bedrails?: None Help needed moving to and from a bed to a chair (including a wheelchair)?: None Help needed standing up from a chair using your arms (e.g., wheelchair or bedside chair)?: None Help needed to walk in hospital room?: None Help needed climbing 3-5 steps with a railing? : A Little 6 Click Score: 23    End of Session Equipment Utilized During Treatment: Gait belt Activity Tolerance: Patient tolerated treatment well Patient left: in chair;with call bell/phone within reach Nurse Communication: Mobility status PT Visit Diagnosis: History of falling (Z91.81);Pain;Muscle weakness (generalized) (M62.81) Pain - part of body:  (cervicalgia)     Time: 1040-1053 PT Time Calculation (min) (ACUTE ONLY): 13 min  Charges:  $Gait Training: 8-22 mins                     Kreg Shropshire, DPT 08/02/2022, 11:50 AM

## 2022-08-03 ENCOUNTER — Telehealth: Payer: 59 | Admitting: Family Medicine

## 2022-08-03 ENCOUNTER — Encounter: Payer: Self-pay | Admitting: Neurosurgery

## 2022-08-03 DIAGNOSIS — M96840 Postprocedural hematoma of a musculoskeletal structure following a musculoskeletal system procedure: Secondary | ICD-10-CM

## 2022-08-03 MED ORDER — CELECOXIB 100 MG PO CAPS: 100.0000 mg | ORAL_CAPSULE | Freq: Two times a day (BID) | ORAL | 0 refills | Status: DC

## 2022-08-03 MED ORDER — ACETAMINOPHEN 325 MG PO TABS: 650.0000 mg | ORAL_TABLET | ORAL | Status: DC | PRN

## 2022-08-03 MED ORDER — METHYLPREDNISOLONE 4 MG PO TBPK: ORAL_TABLET | ORAL | 0 refills | Status: DC

## 2022-08-03 NOTE — Discharge Instructions (Signed)
Your surgeon has performed an operation on your cervical spine (neck) to relieve pressure on the spinal cord and/or nerves. This involved making an incision in the front of your neck and removing one or more of the discs that support your spine. Next, a small piece of bone, a titanium plate, and screws were used to fuse two or more of the vertebrae (bones) together.  The following are instructions to help in your recovery once you have been discharged from the hospital. Even if you feel well, it is important that you follow these activity guidelines. If you do not let your neck heal properly from the surgery, you can increase the chance of return of your symptoms and other complications.  * Do not take anti-inflammatory medications for 3 months after surgery (naproxen [Aleve], ibuprofen [Advil, Motrin], etc.). These medications can prevent your bones from healing properly.  Celebrex, if prescribed, is ok to take.  Activity    No bending, lifting, or twisting ("BLT"). Avoid lifting objects heavier than 10 pounds (gallon milk jug).  Where possible, avoid household activities that involve lifting, bending, reaching, pushing, or pulling such as laundry, vacuuming, grocery shopping, and childcare. Try to arrange for help from friends and family for these activities while your back heals.  Increase physical activity slowly as tolerated.  Taking short walks is encouraged, but avoid strenuous exercise. Do not jog, run, bicycle, lift weights, or participate in any other exercises unless specifically allowed by your doctor.  Talk to your doctor before resuming sexual activity.  You should not drive until cleared by your doctor.  Until released by your doctor, you should not return to work or school.  You should rest at home and let your body heal.   You may shower three days after your surgery.  After showering, lightly dab your incision dry. Do not take a tub bath or go swimming until approved by your  doctor at your follow-up appointment.  If your doctor ordered a cervical collar (neck brace) for you, you should wear it whenever you are out of bed. You may remove it when lying down or sleeping, but you should wear it at all other times. Not all neck surgeries require a cervical collar.  If you smoke, we strongly recommend that you quit.  Smoking has been proven to interfere with normal bone healing and will dramatically reduce the success rate of your surgery. Please contact [facility] (800-QUIT-NOW) and use the resources at www.[facility].com for assistance in stopping smoking.  Surgical Incision   If you have a dressing on your incision, you may remove it two days after your surgery. Keep your incision area clean and dry.  Your incision was closed with Dermabond glue. The glue should begin to peel away within about a week.  Diet           You may return to your usual diet. However, you may experience discomfort when swallowing in the first month after your surgery. This is normal. You may find that softer foods are more comfortable for you to swallow. Be sure to stay hydrated.  When to Contact us  You may experience pain in your neck and/or pain between your shoulder blades. This is normal and should improve in the next few weeks with the help of pain medication, muscle relaxers, and rest. Some patients report that a warm compress on the back of the neck or between the shoulder blades helps.  However, should you experience any of the following, contact us  immediately: New numbness or weakness Pain that is progressively getting worse, and is not relieved by your pain medication, muscle relaxers, rest, and warm compresses Bleeding, redness, swelling, pain, or drainage from surgical incision Chills or flu-like symptoms Fever greater than 101.0 F (38.3 C) Inability to eat, drink fluids, or take medications Problems with bowel or bladder functions Difficulty breathing or shortness of  breath Warmth, tenderness, or swelling in your calf Contact Information During office hours (Monday-Friday 9 am to 5 pm), please call your physician at [phone] and ask for Berdine Addison After hours and weekends, please call [phone] and speak with the neurosurgeon on call For a life-threatening emergency, call 911

## 2022-08-03 NOTE — Patient Instructions (Signed)
Hematoma A hematoma is a collection of blood under the skin, in an organ, in a body space, in a joint space, or in other tissue. The blood can thicken (clot) to form a lump that you can see and feel. The lump is often firm and may become sore and tender. Most hematomas get better in a few days to weeks. However, some hematomas may be serious and require medical care. Hematomas can range from very small to very large. What are the causes? This condition is caused by: A blunt or penetrating injury. Leakage from a blood vessel under the skin. Some medical procedures, including surgeries, such as oral surgery, face lifts, and surgeries on the joints. Some medical conditions that cause bleeding or bruising. There may be multiple hematomas that appear in different areas of the body. What increases the risk? You are more likely to develop this condition if: You are an older adult. You use blood thinners. You regularly use NSAIDs, such as ibuprofen, for pain. You play contact sports. What are the signs or symptoms?  Symptoms of this condition depend on where the hematoma is located.  Common symptoms of a hematoma that is under the skin include: A firm lump on the body. Pain and tenderness in the area. Bruising. Blue, dark blue, purple-red, or yellowish skin (discoloration) may appear at the site of the hematoma if the hematoma is close to the surface of the skin. Common symptoms of a hematoma that is deep in the tissues or body spaces may be less obvious. They include: A collection of blood in the stomach (intra-abdominal hematoma). This may cause pain in the abdomen, weakness, fainting, and shortness of breath. A collection of blood in the head (intracranial hematoma). This may cause a headache or symptoms such as weakness, trouble speaking or understanding, or a change in consciousness. How is this diagnosed? This condition is diagnosed based on: Your medical history. A physical exam. Imaging  tests, such as an ultrasound or CT scan. These may be needed if your health care provider suspects a hematoma in deeper tissues or body spaces. Blood tests. These may be needed if your health care provider believes that the hematoma is caused by a medical condition. How is this treated? Treatment for this condition depends on the cause, size, and location of the hematoma. Treatment may include: Doing nothing. The majority of hematomas do not need treatment as many of them go away on their own. Surgery or close monitoring. This may be needed for large hematomas or hematomas that affect vital organs. Medicines. Medicines may be given if there is an underlying medical cause for the hematoma. Follow these instructions at home: Managing pain, stiffness, and swelling  If directed, put ice on the injured area. To do this: Put ice in a plastic bag. Place a towel between your skin and the bag. Leave the ice on for 20 minutes, 2-3 times a day for the first couple of days. If your skin turns bright red, remove the ice right away to prevent skin damage. The risk of skin damage is higher if you cannot feel pain, heat, or cold. If directed, apply heat to the affected area as often as told by your health care provider. Use the heat source that your health care provider recommends, such as a moist heat pack or a heating pad. Place a towel between your skin and the heat source. Leave the heat on for 20-30 minutes. If your skin turns bright red, remove the heat right  away to prevent burns. The risk of burns is higher if you cannot feel pain, heat, or cold. Raise (elevate) the injured area above the level of your heart while you are sitting or lying down. If directed, wrap the affected area with an elastic bandage. The bandage applies pressure (compression) to the area, which may help to reduce swelling and promote healing. Do not wrap the bandage too tightly around the affected area. If your hematoma is on a leg  or foot (lower extremity) and is painful, your health care provider may recommend crutches. Use them as told by your health care provider. General instructions Take over-the-counter and prescription medicines only as told by your health care provider. Rest the injured area as directed by your health care provider. Keep all follow-up visits. Your health care provider may want to see how your hematoma is progressing with treatment. Contact a health care provider if: You have a fever. The swelling or discoloration gets worse. You develop more hematomas. Your pain is worse or your pain is not controlled with medicine. Your skin over the hematoma breaks or starts bleeding. Get help right away if: Your hematoma is in your chest or abdomen and you have weakness, shortness of breath, or a change in consciousness. You have a hematoma on your scalp that is caused by a fall or injury, and you also have: A headache that gets worse. Trouble speaking or understanding speech. Weakness. A change in alertness or consciousness. These symptoms may be an emergency. Get help right away. Call 911. Do not wait to see if the symptoms will go away. Do not drive yourself to the hospital. This information is not intended to replace advice given to you by your health care provider. Make sure you discuss any questions you have with your health care provider. Document Revised: 01/29/2022 Document Reviewed: 01/29/2022 [employer] Patient Education  [place]

## 2022-08-03 NOTE — Plan of Care (Signed)

## 2022-08-03 NOTE — Plan of Care (Signed)
Patient discharged per MD orders at this time.All dc instructions,education and medications reviewed with the patient.Pt expressed understanding and will comply with dc instructions. Follow up appointments was also communicated to the patient.no verbal c/o or any ssx of distress.Pt was discharged home with self care per order.Pt was transported home by wife in a privately owned vehicle.

## 2022-08-03 NOTE — Progress Notes (Signed)
Discharge notes reviewed with pt. Pt verbalized understanding of instructions.

## 2022-08-03 NOTE — Plan of Care (Signed)
Problem: Education: Goal: Ability to verbalize activity precautions or restrictions will improve 08/03/2022 1006 by Derrek Monaco, RN Outcome: Adequate for Discharge 08/03/2022 1005 by Derrek Monaco, RN Outcome: Progressing Goal: Knowledge of the prescribed therapeutic regimen will improve 08/03/2022 1006 by Derrek Monaco, RN Outcome: Adequate for Discharge 08/03/2022 1005 by Derrek Monaco, RN Outcome: Progressing Goal: Understanding of discharge needs will improve 08/03/2022 1006 by Derrek Monaco, RN Outcome: Adequate for Discharge 08/03/2022 1005 by Derrek Monaco, RN Outcome: Progressing   Problem: Activity: Goal: Ability to avoid complications of mobility impairment will improve 08/03/2022 1006 by Derrek Monaco, RN Outcome: Adequate for Discharge 08/03/2022 1005 by Derrek Monaco, RN Outcome: Progressing Goal: Ability to tolerate increased activity will improve 08/03/2022 1006 by Derrek Monaco, RN Outcome: Adequate for Discharge 08/03/2022 1005 by Derrek Monaco, RN Outcome: Progressing Goal: Will remain free from falls 08/03/2022 1006 by Derrek Monaco, RN Outcome: Adequate for Discharge 08/03/2022 1005 by Derrek Monaco, RN Outcome: Progressing   Problem: Bowel/Gastric: Goal: Gastrointestinal status for postoperative course will improve 08/03/2022 1006 by Derrek Monaco, RN Outcome: Adequate for Discharge 08/03/2022 1005 by Derrek Monaco, RN Outcome: Progressing   Problem: Clinical Measurements: Goal: Ability to maintain clinical measurements within normal limits will improve 08/03/2022 1006 by Derrek Monaco, RN Outcome: Adequate for Discharge 08/03/2022 1005 by Derrek Monaco, RN Outcome: Progressing Goal: Postoperative complications will be avoided or minimized 08/03/2022 1006 by Derrek Monaco, RN Outcome: Adequate for Discharge 08/03/2022 1005 by Derrek Monaco, RN Outcome:  Progressing Goal: Diagnostic test results will improve 08/03/2022 1006 by Derrek Monaco, RN Outcome: Adequate for Discharge 08/03/2022 1005 by Derrek Monaco, RN Outcome: Progressing   Problem: Pain Management: Goal: Pain level will decrease 08/03/2022 1006 by Derrek Monaco, RN Outcome: Adequate for Discharge 08/03/2022 1005 by Derrek Monaco, RN Outcome: Progressing   Problem: Skin Integrity: Goal: Will show signs of wound healing 08/03/2022 1006 by Derrek Monaco, RN Outcome: Adequate for Discharge 08/03/2022 1005 by Derrek Monaco, RN Outcome: Progressing   Problem: Health Behavior/Discharge Planning: Goal: Identification of resources available to assist in meeting health care needs will improve 08/03/2022 1006 by Derrek Monaco, RN Outcome: Adequate for Discharge 08/03/2022 1005 by Derrek Monaco, RN Outcome: Progressing   Problem: Bladder/Genitourinary: Goal: Urinary functional status for postoperative course will improve 08/03/2022 1006 by Derrek Monaco, RN Outcome: Adequate for Discharge 08/03/2022 1005 by Derrek Monaco, RN Outcome: Progressing   Problem: Education: Goal: Knowledge of General Education information will improve Description: Including pain rating scale, medication(s)/side effects and non-pharmacologic comfort measures 08/03/2022 1006 by Derrek Monaco, RN Outcome: Adequate for Discharge 08/03/2022 1005 by Derrek Monaco, RN Outcome: Progressing   Problem: Health Behavior/Discharge Planning: Goal: Ability to manage health-related needs will improve 08/03/2022 1006 by Derrek Monaco, RN Outcome: Adequate for Discharge 08/03/2022 1005 by Derrek Monaco, RN Outcome: Progressing   Problem: Clinical Measurements: Goal: Ability to maintain clinical measurements within normal limits will improve 08/03/2022 1006 by Derrek Monaco, RN Outcome: Adequate for Discharge 08/03/2022 1005 by  Derrek Monaco, RN Outcome: Progressing Goal: Will remain free from infection 08/03/2022 1006 by Derrek Monaco, RN Outcome: Adequate for Discharge 08/03/2022 1005 by Derrek Monaco, RN Outcome: Progressing Goal: Diagnostic test results will improve 08/03/2022 1006 by Derrek Monaco, RN Outcome: Adequate for Discharge 08/03/2022 1005 by Derrek Monaco, RN Outcome: Progressing  Goal: Respiratory complications will improve 08/03/2022 1006 by Derrek Monaco, RN Outcome: Adequate for Discharge 08/03/2022 1005 by Derrek Monaco, RN Outcome: Progressing Goal: Cardiovascular complication will be avoided 08/03/2022 1006 by Derrek Monaco, RN Outcome: Adequate for Discharge 08/03/2022 1005 by Derrek Monaco, RN Outcome: Progressing   Problem: Activity: Goal: Risk for activity intolerance will decrease 08/03/2022 1006 by Derrek Monaco, RN Outcome: Adequate for Discharge 08/03/2022 1005 by Derrek Monaco, RN Outcome: Progressing   Problem: Nutrition: Goal: Adequate nutrition will be maintained 08/03/2022 1006 by Derrek Monaco, RN Outcome: Adequate for Discharge 08/03/2022 1005 by Derrek Monaco, RN Outcome: Progressing   Problem: Coping: Goal: Level of anxiety will decrease 08/03/2022 1006 by Derrek Monaco, RN Outcome: Adequate for Discharge 08/03/2022 1005 by Derrek Monaco, RN Outcome: Progressing   Problem: Elimination: Goal: Will not experience complications related to bowel motility 08/03/2022 1006 by Derrek Monaco, RN Outcome: Adequate for Discharge 08/03/2022 1005 by Derrek Monaco, RN Outcome: Progressing Goal: Will not experience complications related to urinary retention 08/03/2022 1006 by Derrek Monaco, RN Outcome: Adequate for Discharge 08/03/2022 1005 by Derrek Monaco, RN Outcome: Progressing   Problem: Pain Managment: Goal: General experience of comfort will improve 08/03/2022  1006 by Derrek Monaco, RN Outcome: Adequate for Discharge 08/03/2022 1005 by Derrek Monaco, RN Outcome: Progressing   Problem: Safety: Goal: Ability to remain free from injury will improve 08/03/2022 1006 by Derrek Monaco, RN Outcome: Adequate for Discharge 08/03/2022 1005 by Derrek Monaco, RN Outcome: Progressing   Problem: Skin Integrity: Goal: Risk for impaired skin integrity will decrease 08/03/2022 1006 by Derrek Monaco, RN Outcome: Adequate for Discharge 08/03/2022 1005 by Derrek Monaco, RN Outcome: Progressing

## 2022-08-03 NOTE — Progress Notes (Signed)
    Attending Progress Note  History: DEEGAN VALENTINO is s/p C4-7 ACDF for myelopathy and radiculopathy. He re-presented with a post-operative hematoma.  POD2: NAEO. Pt reports better color in left foot and better motor function.  POD1: Doing much better  Physical Exam: Vitals:   08/02/22 1350 08/02/22 2339  BP: (!) 163/93 (!) 148/93  Pulse: 92 87  Resp: 17 20  Temp: 98.1 F (36.7 C) 98 F (36.7 C)  SpO2: 100% 98%    AA Ox3 CNI Strength: MAEW   Neck soft  Incision c/d/I with dermabond in place JP output 35 in the last 24 hours  Data:  Other tests/results: none  Assessment/Plan:  CARMIN DIBARTOLO is a 61 y.o s/p ACDF for myelopathy and radiculopathy, complicated by post-operative hematoma.  - mobilize - pain control - DVT prophylaxis - PTOT; Plan to resume HH - PO steroids for throat swelling. Will d/c with MDP  Cooper Render PA-C Department of Neurosurgery

## 2022-08-03 NOTE — Discharge Summary (Signed)
Discharge Summary  Patient ID: [name] MRN: [id number] DOB/AGE: [birth year] 61 y.o.  Admit date: 08/01/2022 Discharge date: 08/03/2022  Admission Diagnoses: Post-op hematoma  Discharge Diagnoses:  Principal Problem:   Postoperative hematoma of musculoskeletal structure following musculoskeletal procedure   Discharged Condition: good  Hospital Course:  [name] is a 61 y.o initially undergoing a ACDF on 07/30/22.  His intraoperative course was uncomplicated and he was admitted overnight for pain control and drain output monitoring.  His drain was removed on POD1 and he was discharged home.  On the evening of postop day 1 he experienced some increased difficulty with swallowing and had a change in his voice.  He presented to the ER on the early morning of postop day 2 and was found to have a postoperative hematoma.  He was taken back to the operating room on 08/01/2022 for evacuation.  He was admitted again for pain control and drain output monitoring.  His drain was removed on 08/03/2022 and his breathing and voice improved significantly.  There is no other additional complications during his second hospitalization.  Consults: None  Significant Diagnostic Studies:  08/01/22 cervical xrays IMPRESSION: Findings most consistent with a prevertebral hematoma. CT of the neck with contrast may be helpful for further evaluation.     Electronically Signed   By: Inez Catalina M.D.   On: 08/01/2022 03:02  Treatments: surgery: As above.  Please see separately dictated operative report for further details  Discharge Exam: Blood pressure (!) 180/95, pulse 78, temperature 98.5 F (36.9 C), resp. rate 16, height '5\' 7"'$  (1.702 m), weight 70.1 kg, SpO2 98 %.  AA Ox3 CNI Strength: MAEW    Neck soft  Incision c/d/I with dermabond in place  Disposition: Discharge disposition: 01-Home or Self Care       Discharge Instructions     Incentive spirometry RT   Complete by:  As directed    Remove dressing in 24 hours   Complete by: As directed       Allergies as of 08/03/2022   No Known Allergies      Medication List     TAKE these medications    acetaminophen 325 MG tablet Commonly known as: TYLENOL Take 2 tablets (650 mg total) by mouth every 4 (four) hours as needed for mild pain ((score 1 to 3) or temp > 100.5).   ALPHA LIPOIC ACID PO Take 600 mg by mouth daily.   celecoxib 100 MG capsule Commonly known as: CeleBREX Take 1 capsule (100 mg total) by mouth 2 (two) times daily.   VITAMIN B 12 PO Take 5,000 mcg by mouth daily. W/ folate 1700 mcg   cyanocobalamin 1000 MCG/ML injection Commonly known as: VITAMIN B12 Inject into the muscle.   lisinopril 10 MG tablet Commonly known as: ZESTRIL Take 1 tablet (10 mg total) by mouth daily.   methocarbamol 500 MG tablet Commonly known as: ROBAXIN Take 1 tablet (500 mg total) by mouth every 6 (six) hours as needed for muscle spasms.   methylPREDNISolone 4 MG Tbpk tablet Commonly known as: MEDROL DOSEPAK Take as directed on box   multivitamin tablet Take 1 tablet by mouth daily.   oxyCODONE 5 MG immediate release tablet Commonly known as: Oxy IR/ROXICODONE Take 1 tablet (5 mg total) by mouth every 4 (four) hours as needed for moderate pain or severe pain ((score 4 to 6)).   rizatriptan 10 MG tablet Commonly known as: Maxalt Take 1 tablet (10 mg total) by mouth as  needed for migraine. May repeat in 2 hours if needed   senna 8.6 MG Tabs tablet Commonly known as: SENOKOT Take 1 tablet (8.6 mg total) by mouth daily as needed for mild constipation.   VITAMIN D3 PO Take 5,000 Int'l Units by mouth daily.         Signed: Loleta Dicker 08/03/2022, 11:55 AM

## 2022-08-03 NOTE — Progress Notes (Signed)
Virtual Visit Consent   [name], you are scheduled for a virtual visit with a [facility] provider today. Just as with appointments in the office, your consent must be obtained to participate. Your consent will be active for this visit and any virtual visit you may have with one of our providers in the next 365 days. If you have a MyChart account, a copy of this consent can be sent to you electronically.  As this is a virtual visit, video technology does not allow for your provider to perform a traditional examination. This may limit your provider's ability to fully assess your condition. If your provider identifies any concerns that need to be evaluated in person or the need to arrange testing (such as labs, EKG, etc.), we will make arrangements to do so. Although advances in technology are sophisticated, we cannot ensure that it will always work on either your end or our end. If the connection with a video visit is poor, the visit may have to be switched to a telephone visit. With either a video or telephone visit, we are not always able to ensure that we have a secure connection.  By engaging in this virtual visit, you consent to the provision of healthcare and authorize for your insurance to be billed (if applicable) for the services provided during this visit. Depending on your insurance coverage, you may receive a charge related to this service.  I need to obtain your verbal consent now. Are you willing to proceed with your visit today? [name] has provided verbal consent on 08/03/2022 for a virtual visit (video or telephone). [name], FNP  Date: 08/03/2022 7:43 PM  Virtual Visit via Video Note   I, [name], connected with  [name]  ([id number], 25-Aug-1960) on 08/03/22 at  7:30 PM EST by a video-enabled telemedicine application and verified that I am speaking with the correct person using two identifiers.  Location: Patient: Virtual Visit Location  Patient: Home Provider: Virtual Visit Location Provider: Home Office   I discussed the limitations of evaluation and management by telemedicine and the availability of in person appointments. The patient expressed understanding and agreed to proceed.    History of Present Illness: [name] is a 61 y.o. who identifies as a male who was assigned male at birth, and is being seen today for clarification of when to start medrol. His wife is with him and reports he was discharged today from surgery on a hematoma from neck surgery and the PA told them to start medrol tonight however the charge nurse put in DC papers to start tomorrow. They are concerned he will close up and not be able to breath again. [name]  HPI: HPI  Problems:  Patient Active Problem List   Diagnosis Date Noted   Postoperative hematoma of musculoskeletal structure following musculoskeletal procedure 08/01/2022   Cervical myelopathy ([place]) 07/30/2022   Cervical spinal stenosis 07/30/2022   Radiculopathy, cervical 07/30/2022   S/P cervical spinal fusion 07/30/2022   Vitamin D deficiency 06/25/2022   B12 deficiency 06/25/2022   Elevated LDL cholesterol level 06/25/2022   Prediabetes 03/27/2021   History of nausea- reported 01/27/2020 02/02/2020   Accidental fall from ladder 02/02/2020   Acute right-sided thoracic back pain 02/02/2020   Neck stiffness 02/02/2020   Concussion with no loss of consciousness 02/02/2020   Multiple falls 02/02/2020   Head trauma, initial encounter 02/02/2020   Acute post-traumatic headache, not intractable 02/02/2020   Essential hypertension 08/26/2018  Pre-bariatric surgery nutrition evaluation 06/19/2018   Migraine with vertigo 01/05/2014   Arthralgia of multiple joints 04/28/2009   Carpal tunnel syndrome 08/30/2008    Allergies: No Known Allergies Medications:  Current Outpatient Medications:    acetaminophen (TYLENOL) 325 MG tablet, Take 2 tablets (650 mg total) by mouth every 4 (four)  hours as needed for mild pain ((score 1 to 3) or temp > 100.5)., Disp: , Rfl:    ALPHA LIPOIC ACID PO, Take 600 mg by mouth daily., Disp: , Rfl:    celecoxib (CELEBREX) 100 MG capsule, Take 1 capsule (100 mg total) by mouth 2 (two) times daily., Disp: 60 capsule, Rfl: 0   Cholecalciferol (VITAMIN D3 PO), Take 5,000 Int'l Units by mouth daily., Disp: , Rfl:    Cyanocobalamin (VITAMIN B 12 PO), Take 5,000 mcg by mouth daily. W/ folate 1700 mcg, Disp: , Rfl:    cyanocobalamin (VITAMIN B12) 1000 MCG/ML injection, Inject into the muscle., Disp: , Rfl:    lisinopril (ZESTRIL) 10 MG tablet, Take 1 tablet (10 mg total) by mouth daily., Disp: 90 tablet, Rfl: 1   methocarbamol (ROBAXIN) 500 MG tablet, Take 1 tablet (500 mg total) by mouth every 6 (six) hours as needed for muscle spasms., Disp: 120 tablet, Rfl: 0   methylPREDNISolone (MEDROL DOSEPAK) 4 MG TBPK tablet, Take as directed on box, Disp: 21 tablet, Rfl: 0   Multiple Vitamin (MULTIVITAMIN) tablet, Take 1 tablet by mouth daily., Disp: , Rfl:    oxyCODONE (OXY IR/ROXICODONE) 5 MG immediate release tablet, Take 1 tablet (5 mg total) by mouth every 4 (four) hours as needed for moderate pain or severe pain ((score 4 to 6))., Disp: 30 tablet, Rfl: 0   rizatriptan (MAXALT) 10 MG tablet, Take 1 tablet (10 mg total) by mouth as needed for migraine. May repeat in 2 hours if needed, Disp: 10 tablet, Rfl: 0   senna (SENOKOT) 8.6 MG TABS tablet, Take 1 tablet (8.6 mg total) by mouth daily as needed for mild constipation., Disp: 30 tablet, Rfl: 0  Current Facility-Administered Medications:    cyanocobalamin (VITAMIN B12) injection 1,000 mcg, 1,000 mcg, Intramuscular, Q30 days, Jon Billings, NP, 1,000 mcg at 07/26/22 1442  Observations/Objective: Patient is well-developed, well-nourished in no acute distress.  Resting comfortably  at home.  Head is normocephalic, atraumatic.  No labored breathing.  Speech is clear and coherent with logical content.   Patient is alert and oriented at baseline.    Assessment and Plan: 1. Postoperative hematoma of musculoskeletal structure following musculoskeletal procedure  Start medrol tonight per PA instructions. ED for any worsening in breathing.   Follow Up Instructions: I discussed the assessment and treatment plan with the patient. The patient was provided an opportunity to ask questions and all were answered. The patient agreed with the plan and demonstrated an understanding of the instructions.  A copy of instructions were sent to the patient via MyChart unless otherwise noted below.     The patient was advised to call back or seek an in-person evaluation if the symptoms worsen or if the condition fails to improve as anticipated.  Time:  I spent 10 minutes with the patient via telehealth technology discussing the above problems/concerns.    [name], FNP

## 2022-08-03 NOTE — Progress Notes (Signed)
   08/03/22 0907  TOC Assessment  TOC screening is complete Yes  Expected Discharge [place]  Final next level of care [place]  Barriers to Discharge No Barriers Identified  Living arrangements for the past 2 months Single Family Home  Lives with: Spouse  Current home services DME ([employer] - [employer] consultant (2 wheels))  Discharge Planning Services CM Consult  DME Arranged N/A  HH Arranged PT;OT  [place]  Date [place] 08/03/22  Time [place]  Representative spoke with at [place]  Do you feel safe going back to the place where you live? Y  Permission granted to share information with  Yes, Verbal Permission Granted  Patient language and need for interpreter reviewed: Yes  Criminal Activity/Legal Involvement Pertinent to Current Situation/Hospitalization No - Comment as needed  Need for Family Participation in Patient Care Y  Care giver support system in place? Y  Appearance: Appears stated age  Attitude/Demeanor/Rapport Engaged  Affect (typically observed) Pleasant  Orientation:  Oriented to Self;Oriented to Place;Oriented to  Time;Oriented to Situation  Alcohol / Substance Use Not Applicable  Psych Involvement N   Patient was admitted last week and then was set up with DME and [facility] services, plan to continue and will use the DME he received at that visit

## 2022-08-05 NOTE — Anesthesia Postprocedure Evaluation (Signed)
Anesthesia Post Note  Patient: [name]  Procedure(s) Performed: C4-7 ANTERIOR CERVICAL DISCECTOMY AND FUSION (GLOBUS HEDRON)  Patient location during evaluation: PACU Anesthesia Type: General Level of consciousness: awake and alert Pain management: pain level controlled Vital Signs Assessment: post-procedure vital signs reviewed and stable Respiratory status: spontaneous breathing, nonlabored ventilation, respiratory function stable and patient connected to nasal cannula oxygen Cardiovascular status: blood pressure returned to baseline and stable Postop Assessment: no apparent nausea or vomiting Anesthetic complications: no   No notable events documented.   Last Vitals:  Vitals:   07/31/22 0727 07/31/22 1218  BP: (!) 147/84 (!) 143/85  Pulse: 73 72  Resp: 16   Temp: 36.6 C 36.5 C  SpO2: 98% 99%    Last Pain:  Vitals:   07/31/22 1218  TempSrc:   PainSc: 0-No pain                 Molli Barrows

## 2022-08-06 ENCOUNTER — Telehealth: Payer: Self-pay

## 2022-08-06 DIAGNOSIS — E559 Vitamin D deficiency, unspecified: Secondary | ICD-10-CM | POA: Diagnosis not present

## 2022-08-06 DIAGNOSIS — Z4789 Encounter for other orthopedic aftercare: Secondary | ICD-10-CM | POA: Diagnosis not present

## 2022-08-06 DIAGNOSIS — D519 Vitamin B12 deficiency anemia, unspecified: Secondary | ICD-10-CM | POA: Diagnosis not present

## 2022-08-06 DIAGNOSIS — D510 Vitamin B12 deficiency anemia due to intrinsic factor deficiency: Secondary | ICD-10-CM | POA: Diagnosis not present

## 2022-08-06 DIAGNOSIS — I10 Essential (primary) hypertension: Secondary | ICD-10-CM | POA: Diagnosis not present

## 2022-08-06 DIAGNOSIS — Z8616 Personal history of COVID-19: Secondary | ICD-10-CM | POA: Diagnosis not present

## 2022-08-06 DIAGNOSIS — R69 Illness, unspecified: Secondary | ICD-10-CM | POA: Diagnosis not present

## 2022-08-06 DIAGNOSIS — Z981 Arthrodesis status: Secondary | ICD-10-CM | POA: Diagnosis not present

## 2022-08-06 DIAGNOSIS — M1990 Unspecified osteoarthritis, unspecified site: Secondary | ICD-10-CM | POA: Diagnosis not present

## 2022-08-06 DIAGNOSIS — G43109 Migraine with aura, not intractable, without status migrainosus: Secondary | ICD-10-CM | POA: Diagnosis not present

## 2022-08-06 NOTE — Patient Outreach (Signed)
  Care Coordination TOC Note Transition Care Management Follow-up Telephone Call Date of discharge and from where: 08/03/22-[facility] Dx" post-op hematoma" How have you been since you were released from the hospital? Call completed with spouse. Patient currently working with PT in the home. Spouse reports that patient's pain is controlled. Appetite remains good. LBM was Friday. She is upping his Colace dosage to 2x/day today. He is sleeping with use of wedge pillows but only sleeping for short intervals as it is still uncomfortable for him. He is using walker.  Any questions or concerns? No  Items Reviewed: Did the pt receive and understand the discharge instructions provided? Yes  Medications obtained and verified?  Spouse states she had virtual visit on Friday evening to review meds and understands them Other? Yes  Any new allergies since your discharge? No  Dietary orders reviewed? Yes Do you have support at home? Yes   Home Care and Equipment/Supplies: Were home health services ordered? yes If so, what is the name of the agency? [facility]  Has the agency set up a time to come to the patient's home? yes Were any new equipment or medical supplies ordered?  No What is the name of the medical supply agency? N/A Were you able to get the supplies/equipment? not applicable Do you have any questions related to the use of the equipment or supplies? No  Functional Questionnaire: (I = Independent and D = Dependent) ADLs: A  Bathing/Dressing- A  Meal Prep- A  Eating- I  Maintaining continence- A  Transferring/Ambulation- A  Managing Meds- A  Follow up appointments reviewed:  PCP Hospital f/u appt confirmed? No  . [facility] f/u appt confirmed? Yes  Scheduled to see Dr. Wynonia Musty on 08/10/22 @ 2:30pm. Are transportation arrangements needed? No  If their condition worsens, is the pt aware to call PCP or go to the Emergency Dept.? Yes Was the patient provided with contact information  for the PCP's office or ED? Yes Was to pt encouraged to call back with questions or concerns? Yes  SDOH assessments and interventions completed:   Yes SDOH Interventions Today    Flowsheet Row Most Recent Value  SDOH Interventions   Food Insecurity Interventions Intervention Not Indicated  Transportation Interventions Intervention Not Indicated       Care Coordination Interventions:  Education provided    Encounter Outcome:  Pt. Visit Completed    Enzo Montgomery, RN,BSN,CCM [place] Management Telephonic Care Management Coordinator Direct Phone: [phone] Toll Free: [phone] Fax: [phone]

## 2022-08-08 DIAGNOSIS — Z4789 Encounter for other orthopedic aftercare: Secondary | ICD-10-CM | POA: Diagnosis not present

## 2022-08-08 DIAGNOSIS — Z981 Arthrodesis status: Secondary | ICD-10-CM | POA: Diagnosis not present

## 2022-08-08 DIAGNOSIS — M1990 Unspecified osteoarthritis, unspecified site: Secondary | ICD-10-CM | POA: Diagnosis not present

## 2022-08-08 DIAGNOSIS — I10 Essential (primary) hypertension: Secondary | ICD-10-CM | POA: Diagnosis not present

## 2022-08-08 DIAGNOSIS — D519 Vitamin B12 deficiency anemia, unspecified: Secondary | ICD-10-CM | POA: Diagnosis not present

## 2022-08-08 DIAGNOSIS — D510 Vitamin B12 deficiency anemia due to intrinsic factor deficiency: Secondary | ICD-10-CM | POA: Diagnosis not present

## 2022-08-08 DIAGNOSIS — Z8616 Personal history of COVID-19: Secondary | ICD-10-CM | POA: Diagnosis not present

## 2022-08-08 DIAGNOSIS — R69 Illness, unspecified: Secondary | ICD-10-CM | POA: Diagnosis not present

## 2022-08-08 DIAGNOSIS — G43109 Migraine with aura, not intractable, without status migrainosus: Secondary | ICD-10-CM | POA: Diagnosis not present

## 2022-08-08 DIAGNOSIS — E559 Vitamin D deficiency, unspecified: Secondary | ICD-10-CM | POA: Diagnosis not present

## 2022-08-09 NOTE — Progress Notes (Signed)
   REFERRING PHYSICIAN:  Jon [name], [place],  [place] [id number]  DOS: 07/30/22  ACDF C4-C7           08/01/22  Exploration of anterior neck wound  HISTORY OF PRESENT ILLNESS: [name] is 2 weeks status post ACDF C4-C7.   He was readmitted on 08/01/22 for exploration of anterior neck wound due to posterior productive hematoma from musculoskeletal procedure.   He was discharged home on celebrex, robaxin, medrol dose pack, and oxycodone.   He is sleeping with 3 pillows- has increased drainage and coughing when he lays flat. Some relief with OTC medications. He just finished his dose pack. He complains of pain at the incision. No arm pain. No numbness, tingling, or weakness.   PHYSICAL EXAMINATION:  NEUROLOGICAL:  General: In no acute distress.   Awake, alert, oriented to person, place, and time.  Pupils equal round and reactive to light.  Facial tone is symmetric.    Strength: Side Biceps Triceps Deltoid Interossei Grip Wrist Ext. Wrist Flex.  R '5 5 5 5 5 5 5  '$ L '5 5 5 5 5 5 5   '$ Incision c/d/I, minimal bruising.   Imaging:  Nothing new to review.   Assessment / Plan: [name] is doing well s/p above surgeries. Treatment options reviewed with patient and following plan made:   - We discussed activity escalation and I have advised the patient to lift up to 10 pounds until 6 weeks after surgery (until your follow up with Dr. Izora Ribas).   - Reviewed wound care.  - Continue current medications including prn oxycodone and prn robaxin.  - Not sure he wants to start celebrex, he is concerned about side effects. Can do OTC tylenol as directed on bottle instead.  - Reminded that he cannot take NSAIDs such as motrin.  - Follow up as scheduled in 4 weeks and prn.   Advised to contact the office if any questions or concerns arise.   [name] Boot PA-C Dept of Neurosurgery

## 2022-08-10 ENCOUNTER — Encounter: Payer: Self-pay | Admitting: Orthopedic Surgery

## 2022-08-10 ENCOUNTER — Ambulatory Visit (INDEPENDENT_AMBULATORY_CARE_PROVIDER_SITE_OTHER): Payer: 59 | Admitting: Orthopedic Surgery

## 2022-08-10 VITALS — BP 122/69 | HR 104 | Temp 98.5°F

## 2022-08-10 DIAGNOSIS — G959 Disease of spinal cord, unspecified: Secondary | ICD-10-CM

## 2022-08-10 DIAGNOSIS — I10 Essential (primary) hypertension: Secondary | ICD-10-CM | POA: Diagnosis not present

## 2022-08-10 DIAGNOSIS — Z981 Arthrodesis status: Secondary | ICD-10-CM | POA: Diagnosis not present

## 2022-08-10 DIAGNOSIS — D510 Vitamin B12 deficiency anemia due to intrinsic factor deficiency: Secondary | ICD-10-CM | POA: Diagnosis not present

## 2022-08-10 DIAGNOSIS — E559 Vitamin D deficiency, unspecified: Secondary | ICD-10-CM | POA: Diagnosis not present

## 2022-08-10 DIAGNOSIS — M4802 Spinal stenosis, cervical region: Secondary | ICD-10-CM

## 2022-08-10 DIAGNOSIS — G43109 Migraine with aura, not intractable, without status migrainosus: Secondary | ICD-10-CM | POA: Diagnosis not present

## 2022-08-10 DIAGNOSIS — Z09 Encounter for follow-up examination after completed treatment for conditions other than malignant neoplasm: Secondary | ICD-10-CM

## 2022-08-10 DIAGNOSIS — M1990 Unspecified osteoarthritis, unspecified site: Secondary | ICD-10-CM | POA: Diagnosis not present

## 2022-08-10 DIAGNOSIS — Z8616 Personal history of COVID-19: Secondary | ICD-10-CM | POA: Diagnosis not present

## 2022-08-10 DIAGNOSIS — D519 Vitamin B12 deficiency anemia, unspecified: Secondary | ICD-10-CM | POA: Diagnosis not present

## 2022-08-10 DIAGNOSIS — Z4789 Encounter for other orthopedic aftercare: Secondary | ICD-10-CM | POA: Diagnosis not present

## 2022-08-10 DIAGNOSIS — R69 Illness, unspecified: Secondary | ICD-10-CM | POA: Diagnosis not present

## 2022-08-14 ENCOUNTER — Encounter: Payer: Self-pay | Admitting: Neurosurgery

## 2022-08-14 DIAGNOSIS — R69 Illness, unspecified: Secondary | ICD-10-CM | POA: Diagnosis not present

## 2022-08-14 DIAGNOSIS — Z4789 Encounter for other orthopedic aftercare: Secondary | ICD-10-CM | POA: Diagnosis not present

## 2022-08-14 DIAGNOSIS — D510 Vitamin B12 deficiency anemia due to intrinsic factor deficiency: Secondary | ICD-10-CM | POA: Diagnosis not present

## 2022-08-14 DIAGNOSIS — I10 Essential (primary) hypertension: Secondary | ICD-10-CM | POA: Diagnosis not present

## 2022-08-14 DIAGNOSIS — E559 Vitamin D deficiency, unspecified: Secondary | ICD-10-CM | POA: Diagnosis not present

## 2022-08-14 DIAGNOSIS — M1990 Unspecified osteoarthritis, unspecified site: Secondary | ICD-10-CM | POA: Diagnosis not present

## 2022-08-14 DIAGNOSIS — D519 Vitamin B12 deficiency anemia, unspecified: Secondary | ICD-10-CM | POA: Diagnosis not present

## 2022-08-14 DIAGNOSIS — G43109 Migraine with aura, not intractable, without status migrainosus: Secondary | ICD-10-CM | POA: Diagnosis not present

## 2022-08-14 DIAGNOSIS — Z981 Arthrodesis status: Secondary | ICD-10-CM | POA: Diagnosis not present

## 2022-08-14 DIAGNOSIS — Z8616 Personal history of COVID-19: Secondary | ICD-10-CM | POA: Diagnosis not present

## 2022-08-15 ENCOUNTER — Other Ambulatory Visit: Payer: Self-pay | Admitting: Neurosurgery

## 2022-08-15 ENCOUNTER — Telehealth: Payer: Self-pay

## 2022-08-15 DIAGNOSIS — Z4789 Encounter for other orthopedic aftercare: Secondary | ICD-10-CM | POA: Diagnosis not present

## 2022-08-15 DIAGNOSIS — M1990 Unspecified osteoarthritis, unspecified site: Secondary | ICD-10-CM | POA: Diagnosis not present

## 2022-08-15 DIAGNOSIS — E559 Vitamin D deficiency, unspecified: Secondary | ICD-10-CM | POA: Diagnosis not present

## 2022-08-15 DIAGNOSIS — G43109 Migraine with aura, not intractable, without status migrainosus: Secondary | ICD-10-CM | POA: Diagnosis not present

## 2022-08-15 DIAGNOSIS — Z981 Arthrodesis status: Secondary | ICD-10-CM | POA: Diagnosis not present

## 2022-08-15 DIAGNOSIS — I10 Essential (primary) hypertension: Secondary | ICD-10-CM | POA: Diagnosis not present

## 2022-08-15 DIAGNOSIS — R69 Illness, unspecified: Secondary | ICD-10-CM | POA: Diagnosis not present

## 2022-08-15 DIAGNOSIS — D519 Vitamin B12 deficiency anemia, unspecified: Secondary | ICD-10-CM | POA: Diagnosis not present

## 2022-08-15 DIAGNOSIS — Z8616 Personal history of COVID-19: Secondary | ICD-10-CM | POA: Diagnosis not present

## 2022-08-15 DIAGNOSIS — D510 Vitamin B12 deficiency anemia due to intrinsic factor deficiency: Secondary | ICD-10-CM | POA: Diagnosis not present

## 2022-08-15 MED ORDER — OXYCODONE HCL 5 MG PO TABS: 5.0000 mg | ORAL_TABLET | Freq: Three times a day (TID) | ORAL | 0 refills | Status: AC | PRN

## 2022-08-15 NOTE — Progress Notes (Signed)
Refill sent for Oxycodone '5mg'$  TID PRN

## 2022-08-15 NOTE — Telephone Encounter (Signed)
Received authorization request from [employer] for oxycodone. Request has been approved through 09/14/2022.

## 2022-08-17 ENCOUNTER — Encounter: Payer: Self-pay | Admitting: Neurosurgery

## 2022-08-17 DIAGNOSIS — G43109 Migraine with aura, not intractable, without status migrainosus: Secondary | ICD-10-CM | POA: Diagnosis not present

## 2022-08-17 DIAGNOSIS — E559 Vitamin D deficiency, unspecified: Secondary | ICD-10-CM | POA: Diagnosis not present

## 2022-08-17 DIAGNOSIS — Z981 Arthrodesis status: Secondary | ICD-10-CM

## 2022-08-17 DIAGNOSIS — Z4789 Encounter for other orthopedic aftercare: Secondary | ICD-10-CM | POA: Diagnosis not present

## 2022-08-17 DIAGNOSIS — D519 Vitamin B12 deficiency anemia, unspecified: Secondary | ICD-10-CM | POA: Diagnosis not present

## 2022-08-17 DIAGNOSIS — R69 Illness, unspecified: Secondary | ICD-10-CM | POA: Diagnosis not present

## 2022-08-17 DIAGNOSIS — D510 Vitamin B12 deficiency anemia due to intrinsic factor deficiency: Secondary | ICD-10-CM | POA: Diagnosis not present

## 2022-08-17 DIAGNOSIS — M1990 Unspecified osteoarthritis, unspecified site: Secondary | ICD-10-CM | POA: Diagnosis not present

## 2022-08-17 DIAGNOSIS — I10 Essential (primary) hypertension: Secondary | ICD-10-CM | POA: Diagnosis not present

## 2022-08-17 DIAGNOSIS — Z8616 Personal history of COVID-19: Secondary | ICD-10-CM | POA: Diagnosis not present

## 2022-08-27 ENCOUNTER — Ambulatory Visit: Payer: 59

## 2022-08-28 ENCOUNTER — Encounter: Payer: 59 | Attending: Psychology | Admitting: Psychology

## 2022-08-28 DIAGNOSIS — R413 Other amnesia: Secondary | ICD-10-CM | POA: Diagnosis not present

## 2022-08-28 DIAGNOSIS — F840 Autistic disorder: Secondary | ICD-10-CM

## 2022-08-28 DIAGNOSIS — G3184 Mild cognitive impairment, so stated: Secondary | ICD-10-CM

## 2022-08-28 DIAGNOSIS — R41840 Attention and concentration deficit: Secondary | ICD-10-CM | POA: Diagnosis not present

## 2022-08-29 ENCOUNTER — Ambulatory Visit (INDEPENDENT_AMBULATORY_CARE_PROVIDER_SITE_OTHER): Payer: 59

## 2022-08-29 ENCOUNTER — Ambulatory Visit: Payer: 59 | Admitting: Psychology

## 2022-08-29 DIAGNOSIS — E538 Deficiency of other specified B group vitamins: Secondary | ICD-10-CM

## 2022-08-29 MED ORDER — CYANOCOBALAMIN 1000 MCG/ML IJ SOLN
1000.0000 ug | Freq: Once | INTRAMUSCULAR | Status: AC
  Administered 2022-08-29: 1000 ug via INTRAMUSCULAR

## 2022-08-30 ENCOUNTER — Encounter: Payer: 59 | Admitting: Nurse Practitioner

## 2022-09-06 ENCOUNTER — Encounter: Payer: Self-pay | Admitting: Neurosurgery

## 2022-09-10 ENCOUNTER — Other Ambulatory Visit: Payer: Self-pay

## 2022-09-10 DIAGNOSIS — Z981 Arthrodesis status: Secondary | ICD-10-CM

## 2022-09-11 ENCOUNTER — Ambulatory Visit
Admission: RE | Admit: 2022-09-11 | Discharge: 2022-09-11 | Disposition: A | Discharge status: Home / Self Care | Payer: 59 | Source: Ambulatory Visit | Attending: Neurosurgery | Admitting: Neurosurgery

## 2022-09-11 ENCOUNTER — Ambulatory Visit (INDEPENDENT_AMBULATORY_CARE_PROVIDER_SITE_OTHER): Payer: 59 | Admitting: Neurosurgery

## 2022-09-11 ENCOUNTER — Encounter: Payer: Self-pay | Admitting: Neurosurgery

## 2022-09-11 VITALS — BP 126/78 | Ht 67.0 in | Wt 154.0 lb

## 2022-09-11 DIAGNOSIS — Z981 Arthrodesis status: Secondary | ICD-10-CM

## 2022-09-11 DIAGNOSIS — Z09 Encounter for follow-up examination after completed treatment for conditions other than malignant neoplasm: Secondary | ICD-10-CM

## 2022-09-11 DIAGNOSIS — G959 Disease of spinal cord, unspecified: Secondary | ICD-10-CM

## 2022-09-11 DIAGNOSIS — M47812 Spondylosis without myelopathy or radiculopathy, cervical region: Secondary | ICD-10-CM | POA: Diagnosis not present

## 2022-09-11 DIAGNOSIS — M4802 Spinal stenosis, cervical region: Secondary | ICD-10-CM

## 2022-09-11 NOTE — Progress Notes (Signed)
   REFERRING PHYSICIAN:  Jon Billings, [place],  [place] [id number]  DOS: 07/30/22  ACDF C4-C7           08/01/22  Exploration of anterior neck wound  HISTORY OF PRESENT ILLNESS: BAWI LAKINS is status post ACDF C4-C7.   He is doing very well.  He has minimal pain.  PHYSICAL EXAMINATION:  NEUROLOGICAL:  General: In no acute distress.   Awake, alert, oriented to person, place, and time.  Pupils equal round and reactive to light.  Facial tone is symmetric.    Strength: Side Biceps Triceps Deltoid Interossei Grip Wrist Ext. Wrist Flex.  R '5 5 5 5 5 5 5  '$ L '5 5 5 5 5 5 5   '$ Incision c/d/I, minimal bruising.   Imaging:  No complications noted  Assessment / Plan: KAILASH HINZE is doing well s/p above surgeries.  I am very pleased with his response to surgery.  We reviewed his activity limitations.  He is limited to 25 pound lifting and low impact activities.  He will continue outpatient physical therapy.  We reviewed the considerations to return to driving.  He is not quite ready for that.  I will see him back in 6 weeks with x-rays.    Meade Maw MD Dept of Neurosurgery

## 2022-09-12 DIAGNOSIS — M4322 Fusion of spine, cervical region: Secondary | ICD-10-CM | POA: Diagnosis not present

## 2022-09-12 DIAGNOSIS — R293 Abnormal posture: Secondary | ICD-10-CM | POA: Diagnosis not present

## 2022-09-17 DIAGNOSIS — R293 Abnormal posture: Secondary | ICD-10-CM | POA: Diagnosis not present

## 2022-09-17 DIAGNOSIS — M4322 Fusion of spine, cervical region: Secondary | ICD-10-CM | POA: Diagnosis not present

## 2022-09-18 ENCOUNTER — Encounter: Payer: Self-pay | Admitting: Psychology

## 2022-09-18 ENCOUNTER — Encounter (HOSPITAL_BASED_OUTPATIENT_CLINIC_OR_DEPARTMENT_OTHER): Payer: 59 | Admitting: Psychology

## 2022-09-18 DIAGNOSIS — G3184 Mild cognitive impairment, so stated: Secondary | ICD-10-CM

## 2022-09-18 DIAGNOSIS — R413 Other amnesia: Secondary | ICD-10-CM

## 2022-09-18 DIAGNOSIS — F840 Autistic disorder: Secondary | ICD-10-CM | POA: Diagnosis not present
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
## 2022-09-19 DIAGNOSIS — R293 Abnormal posture: Secondary | ICD-10-CM | POA: Diagnosis not present

## 2022-09-19 DIAGNOSIS — M4322 Fusion of spine, cervical region: Secondary | ICD-10-CM | POA: Diagnosis not present

## 2022-09-24 DIAGNOSIS — M4322 Fusion of spine, cervical region: Secondary | ICD-10-CM | POA: Diagnosis not present

## 2022-09-24 DIAGNOSIS — R293 Abnormal posture: Secondary | ICD-10-CM | POA: Diagnosis not present

## 2022-09-25 ENCOUNTER — Encounter: Payer: Self-pay | Admitting: Neurosurgery

## 2022-09-26 ENCOUNTER — Encounter: Payer: Self-pay | Admitting: Neurosurgery

## 2022-09-26 ENCOUNTER — Ambulatory Visit: Payer: 59 | Admitting: Nurse Practitioner

## 2022-09-26 ENCOUNTER — Other Ambulatory Visit: Payer: Self-pay | Admitting: Neurosurgery

## 2022-09-26 DIAGNOSIS — R293 Abnormal posture: Secondary | ICD-10-CM | POA: Diagnosis not present

## 2022-09-26 DIAGNOSIS — R531 Weakness: Secondary | ICD-10-CM

## 2022-09-26 DIAGNOSIS — M4322 Fusion of spine, cervical region: Secondary | ICD-10-CM | POA: Diagnosis not present

## 2022-09-27 ENCOUNTER — Ambulatory Visit: Payer: 59

## 2022-09-27 ENCOUNTER — Encounter: Payer: Self-pay | Admitting: Nurse Practitioner

## 2022-09-27 ENCOUNTER — Ambulatory Visit (INDEPENDENT_AMBULATORY_CARE_PROVIDER_SITE_OTHER): Payer: 59 | Admitting: Nurse Practitioner

## 2022-09-27 ENCOUNTER — Ambulatory Visit: Payer: 59 | Admitting: Nurse Practitioner

## 2022-09-27 VITALS — BP 122/78 | HR 99 | Temp 98.2°F | Wt 151.7 lb

## 2022-09-27 DIAGNOSIS — R7303 Prediabetes: Secondary | ICD-10-CM | POA: Diagnosis not present

## 2022-09-27 DIAGNOSIS — E559 Vitamin D deficiency, unspecified: Secondary | ICD-10-CM

## 2022-09-27 DIAGNOSIS — E538 Deficiency of other specified B group vitamins: Secondary | ICD-10-CM

## 2022-09-27 DIAGNOSIS — I10 Essential (primary) hypertension: Secondary | ICD-10-CM | POA: Diagnosis not present

## 2022-09-27 DIAGNOSIS — M5416 Radiculopathy, lumbar region: Secondary | ICD-10-CM | POA: Diagnosis not present

## 2022-09-27 DIAGNOSIS — E7800 Pure hypercholesterolemia, unspecified: Secondary | ICD-10-CM

## 2022-09-27 MED ORDER — LISINOPRIL 20 MG PO TABS: 20.0000 mg | ORAL_TABLET | Freq: Every day | ORAL | 1 refills | Status: DC

## 2022-09-27 NOTE — Assessment & Plan Note (Signed)
Labs ordered at visit today.  Will make recommendations based on lab results. Received B12 injection at visit today.

## 2022-09-27 NOTE — Assessment & Plan Note (Signed)
Labs ordered at visit today.  Will make recommendations based on lab results.   

## 2022-09-27 NOTE — Assessment & Plan Note (Addendum)
Chronic. Not well controlled.  Running 140s/70s at home.  Will increase Lisinopril to '20mg'$  daily.  Labs ordered today.  Follow up in 1 month.  Call sooner if concerns arise.

## 2022-09-27 NOTE — Progress Notes (Signed)
BP 122/78   Pulse 99   Temp 98.2 F (36.8 C) (Oral)   Wt 151 lb 11.2 oz (68.8 kg)   SpO2 97%   BMI 23.76 kg/m    Subjective:    Patient ID: [name], male    DOB: 1960-09-29, 62 y.o.   MRN: [id number]  HPI: [name] is a 62 y.o. male  Chief Complaint  Patient presents with   Hypertension   HYPERTENSION without Chronic Kidney Disease Hypertension status: controlled  Satisfied with current treatment? yes Duration of hypertension: years BP monitoring frequency:  daily BP range: 140/70 BP medication side effects:  no Medication compliance: excellent compliance Previous BP meds:lisinopril Aspirin: no Recurrent headaches: no Visual changes: no Palpitations: no Dyspnea: no Chest pain: no Lower extremity edema: no Dizzy/lightheaded: no  Patient had surgery on his cervical spine.  He had a hematoma develop then had to have a repeat surgery about 36 hours later.  He is not recovering well and following orders of his surgeon.   Relevant past medical, surgical, family and social history reviewed and updated as indicated. Interim medical history since our last visit reviewed.  Allergies and medications reviewed and updated.  Review of Systems  Eyes:  Negative for visual disturbance.  Respiratory:  Negative for shortness of breath.   Cardiovascular:  Negative for chest pain and leg swelling.  Neurological:  Negative for light-headedness and headaches.    Per HPI unless specifically indicated above     Objective:    BP 122/78   Pulse 99   Temp 98.2 F (36.8 C) (Oral)   Wt 151 lb 11.2 oz (68.8 kg)   SpO2 97%   BMI 23.76 kg/m   Wt Readings from Last 3 Encounters:  09/27/22 151 lb 11.2 oz (68.8 kg)  09/11/22 154 lb (69.9 kg)  08/01/22 154 lb 8.7 oz (70.1 kg)    Physical Exam Vitals and nursing note reviewed.  Constitutional:      General: He is not in acute distress.    Appearance: Normal appearance. He is not ill-appearing, toxic-appearing  or diaphoretic.  HENT:     Head: Normocephalic.     Right Ear: External ear normal.     Left Ear: External ear normal.     Nose: Nose normal. No congestion or rhinorrhea.     Mouth/Throat:     Mouth: Mucous membranes are moist.  Eyes:     General:        Right eye: No discharge.        Left eye: No discharge.     Extraocular Movements: Extraocular movements intact.     Conjunctiva/sclera: Conjunctivae normal.     Pupils: Pupils are equal, round, and reactive to light.  Cardiovascular:     Rate and Rhythm: Normal rate and regular rhythm.     Heart sounds: No murmur heard. Pulmonary:     Effort: Pulmonary effort is normal. No respiratory distress.     Breath sounds: Normal breath sounds. No wheezing, rhonchi or rales.  Abdominal:     General: Abdomen is flat. Bowel sounds are normal.  Musculoskeletal:     Cervical back: Normal range of motion and neck supple.  Skin:    General: Skin is warm and dry.     Capillary Refill: Capillary refill takes less than 2 seconds.  Neurological:     General: No focal deficit present.     Mental Status: He is alert and oriented to person, place, and time.  Psychiatric:        Mood and Affect: Mood normal.        Behavior: Behavior normal.        Thought Content: Thought content normal.        Judgment: Judgment normal.     Results for orders placed or performed during the hospital encounter of 08/01/22  CBC with Differential  Result Value Ref Range   WBC 13.4 (H) 4.0 - 10.5 K/uL   RBC 4.55 4.22 - 5.81 MIL/uL   Hemoglobin 14.1 13.0 - 17.0 g/dL   HCT 42.6 39.0 - 52.0 %   MCV 93.6 80.0 - 100.0 fL   MCH 31.0 26.0 - 34.0 pg   MCHC 33.1 30.0 - 36.0 g/dL   RDW 12.6 11.5 - 15.5 %   Platelets 228 150 - 400 K/uL   nRBC 0.0 0.0 - 0.2 %   Neutrophils Relative % 68 %   Neutro Abs 8.9 (H) 1.7 - 7.7 K/uL   Lymphocytes Relative 26 %   Lymphs Abs 3.5 0.7 - 4.0 K/uL   Monocytes Relative 6 %   Monocytes Absolute 0.8 0.1 - 1.0 K/uL   Eosinophils  Relative 0 %   Eosinophils Absolute 0.0 0.0 - 0.5 K/uL   Basophils Relative 0 %   Basophils Absolute 0.0 0.0 - 0.1 K/uL   Immature Granulocytes 0 %   Abs Immature Granulocytes 0.05 0.00 - 0.07 K/uL  Brain natriuretic peptide  Result Value Ref Range   B Natriuretic Peptide 15.9 0.0 - 100.0 pg/mL  Basic metabolic panel  Result Value Ref Range   Sodium 140 135 - 145 mmol/L   Potassium 3.8 3.5 - 5.1 mmol/L   Chloride 105 98 - 111 mmol/L   CO2 27 22 - 32 mmol/L   Glucose, Bld 131 (H) 70 - 99 mg/dL   BUN 13 8 - 23 mg/dL   Creatinine, Ser 0.73 0.61 - 1.24 mg/dL   Calcium 8.8 (L) 8.9 - 10.3 mg/dL   GFR, Estimated >60 >60 mL/min   Anion gap 8 5 - 15      Assessment & Plan:   Problem List Items Addressed This Visit       Cardiovascular and Mediastinum   Essential hypertension - Primary    Chronic. Not well controlled.  Running 140s/70s at home.  Will increase Lisinopril to '20mg'$  daily.  Labs ordered today.  Follow up in 1 month.  Call sooner if concerns arise.       Relevant Medications   lisinopril (ZESTRIL) 20 MG tablet   Other Relevant Orders   Comp Met (CMET)     Other   Prediabetes    Labs ordered at visit today.  Will make recommendations based on lab results.        Relevant Orders   HgB A1c   Vitamin D deficiency    Labs ordered at visit today.  Will make recommendations based on lab results.        Relevant Orders   Vitamin D (25 hydroxy)   B12 deficiency    Labs ordered at visit today.  Will make recommendations based on lab results. Received B12 injection at visit today.         Relevant Orders   B12   CBC w/Diff   Elevated LDL cholesterol level    Labs ordered at visit today.  Will make recommendations based on lab results.        Relevant Orders   Lipid Profile  Follow up plan: Return in about 1 month (around 10/26/2022) for BP Check.   A total of 30 minutes were spent on this encounter today.  When total time is documented, this includes  both the face-to-face and non-face-to-face time personally spent before, during and after the visit on the date of the encounter symptoms, surgery, plan of care and labs.

## 2022-09-28 ENCOUNTER — Encounter: Payer: Self-pay | Admitting: Nurse Practitioner

## 2022-09-28 LAB — CBC WITH DIFFERENTIAL/PLATELET
Basophils Absolute: 0 10*3/uL (ref 0.0–0.2)
Basos: 1 %
EOS (ABSOLUTE): 0.1 10*3/uL (ref 0.0–0.4)
Eos: 1 %
Hematocrit: 42.7 % (ref 37.5–51.0)
Hemoglobin: 14.3 g/dL (ref 13.0–17.7)
Immature Grans (Abs): 0 10*3/uL (ref 0.0–0.1)
Immature Granulocytes: 0 %
Lymphocytes Absolute: 1.8 10*3/uL (ref 0.7–3.1)
Lymphs: 30 %
MCH: 30.5 pg (ref 26.6–33.0)
MCHC: 33.5 g/dL (ref 31.5–35.7)
MCV: 91 fL (ref 79–97)
Monocytes Absolute: 0.4 10*3/uL (ref 0.1–0.9)
Monocytes: 7 %
Neutrophils Absolute: 3.7 10*3/uL (ref 1.4–7.0)
Neutrophils: 61 %
Platelets: 232 10*3/uL (ref 150–450)
RBC: 4.69 x10E6/uL (ref 4.14–5.80)
RDW: 12.4 % (ref 11.6–15.4)
WBC: 6 10*3/uL (ref 3.4–10.8)

## 2022-09-28 LAB — HEMOGLOBIN A1C
Est. average glucose Bld gHb Est-mCnc: 120 mg/dL
Hgb A1c MFr Bld: 5.8 % — ABNORMAL HIGH (ref 4.8–5.6)

## 2022-09-28 LAB — LIPID PANEL
Chol/HDL Ratio: 2.9 ratio (ref 0.0–5.0)
Cholesterol, Total: 203 mg/dL — ABNORMAL HIGH (ref 100–199)
HDL: 70 mg/dL (ref >39)
LDL Chol Calc (NIH): 103 mg/dL — ABNORMAL HIGH (ref 0–99)
Triglycerides: 175 mg/dL — ABNORMAL HIGH (ref 0–149)
VLDL Cholesterol Cal: 30 mg/dL (ref 5–40)

## 2022-09-28 LAB — COMPREHENSIVE METABOLIC PANEL
ALT: 11 IU/L (ref 0–44)
AST: 16 IU/L (ref 0–40)
Albumin/Globulin Ratio: 2.4 — ABNORMAL HIGH (ref 1.2–2.2)
Albumin: 4.3 g/dL (ref 3.9–4.9)
Alkaline Phosphatase: 90 IU/L (ref 44–121)
BUN/Creatinine Ratio: 9 — ABNORMAL LOW (ref 10–24)
BUN: 7 mg/dL — ABNORMAL LOW (ref 8–27)
Bilirubin Total: 0.7 mg/dL (ref 0.0–1.2)
CO2: 24 mmol/L (ref 20–29)
Calcium: 9.2 mg/dL (ref 8.6–10.2)
Chloride: 101 mmol/L (ref 96–106)
Creatinine, Ser: 0.79 mg/dL (ref 0.76–1.27)
Globulin, Total: 1.8 g/dL (ref 1.5–4.5)
Glucose: 94 mg/dL (ref 70–99)
Potassium: 4.7 mmol/L (ref 3.5–5.2)
Sodium: 140 mmol/L (ref 134–144)
Total Protein: 6.1 g/dL (ref 6.0–8.5)
eGFR: 100 mL/min/{1.73_m2} (ref >59)

## 2022-09-28 LAB — VITAMIN B12: Vitamin B-12: >2000 pg/mL — ABNORMAL HIGH (ref 232–1245)

## 2022-09-28 LAB — VITAMIN D 25 HYDROXY (VIT D DEFICIENCY, FRACTURES): Vit D, 25-Hydroxy: 40.9 ng/mL (ref 30.0–100.0)

## 2022-09-28 NOTE — Progress Notes (Signed)
Hi [name]. It was nice to see you yesterday.  Your lab work looks good.  Cholesterol is a little higher than before but likely due to not fasting yesterday which is okay.  B12 is above the normal range.  We should back off the injections to every other month.  A1c remains well controlled at 5.8%.  No concerns at this time. Continue with your current medication regimen.  Follow up as discussed.  Please let me know if you have any questions.

## 2022-10-02 ENCOUNTER — Telehealth: Payer: Self-pay | Admitting: Nurse Practitioner

## 2022-10-02 DIAGNOSIS — M4322 Fusion of spine, cervical region: Secondary | ICD-10-CM | POA: Diagnosis not present

## 2022-10-02 DIAGNOSIS — R293 Abnormal posture: Secondary | ICD-10-CM | POA: Diagnosis not present

## 2022-10-02 NOTE — Telephone Encounter (Signed)
Copied from [place] [phone]. Topic: General - Other >> Oct 02, 2022 12:19 PM Chapman Fitch wrote: Reason for CRM: Pts wife asked Alwyn Ren to give her a call about a bill with the wrong CPT code / DOS 11.06.23/  she is getting no where with billing dept /please advise

## 2022-10-05 DIAGNOSIS — R293 Abnormal posture: Secondary | ICD-10-CM | POA: Diagnosis not present

## 2022-10-05 DIAGNOSIS — M4322 Fusion of spine, cervical region: Secondary | ICD-10-CM | POA: Diagnosis not present

## 2022-10-05 NOTE — Telephone Encounter (Signed)
Patient's wife is calling in to follow up with [name] in regards to a code change. Please follow up with patient/patient's wife.

## 2022-10-09 ENCOUNTER — Encounter: Payer: 59 | Attending: Psychology | Admitting: Psychology

## 2022-10-09 DIAGNOSIS — G3184 Mild cognitive impairment, so stated: Secondary | ICD-10-CM

## 2022-10-09 DIAGNOSIS — R69 Illness, unspecified: Secondary | ICD-10-CM | POA: Diagnosis not present

## 2022-10-09 DIAGNOSIS — F840 Autistic disorder: Secondary | ICD-10-CM | POA: Diagnosis not present

## 2022-10-09 DIAGNOSIS — R413 Other amnesia: Secondary | ICD-10-CM | POA: Diagnosis not present

## 2022-10-09 NOTE — Therapy (Signed)
OUTPATIENT PHYSICAL THERAPY NEURO EVALUATION   Patient Name: [name] MRN: [id number] DOB:1960/12/01, 62 y.o., male Today's Date: 10/10/2022   PCP: Jon Billings NP REFERRING PROVIDER: Loleta Dicker PA  END OF SESSION:  PT End of Session - 10/10/22 1250     Visit Number 1    Number of Visits 12    Date for PT Re-Evaluation 01/02/23    Authorization Type 1/10 eval 2/21    PT Start Time 1100    PT Stop Time 1159    PT Time Calculation (min) 59 min    Equipment Utilized During Treatment Gait belt    Activity Tolerance Patient tolerated treatment well    Behavior During Therapy WFL for tasks assessed/performed             Past Medical History:  Diagnosis Date   ADHD    Arthritis    Concussion    COVID 12/2020   Hypertension    Multiple lacunar infarcts ([place])    MVP (mitral valve prolapse)    Pernicious anemia    [facility] spotted fever    Spinal stenosis    Vestibular migraine    Vitamin B12 deficiency    Vitamin D deficiency    Past Surgical History:  Procedure Laterality Date   ANTERIOR CERVICAL DECOMP/DISCECTOMY FUSION N/A 07/30/2022   Procedure: C4-7 ANTERIOR CERVICAL DISCECTOMY AND FUSION (GLOBUS HEDRON);  Surgeon: Meade Maw, MD;  Location: [facility] ORS;  Service: Neurosurgery;  Laterality: N/A;   BILATERAL CARPAL TUNNEL RELEASE     CERVICAL WOUND DEBRIDEMENT N/A 08/01/2022   Procedure: CERVICAL WOUND DEBRIDEMENT OF HEMATOMA;  Surgeon: Meade Maw, MD;  Location: [facility] ORS;  Service: Neurosurgery;  Laterality: N/A;   FRACTURE SURGERY Left 1984   intramedullary rod   hip bone transplant  1984   LEG SURGERY Left    oseotomy   REPAIR ANKLE LIGAMENT Right 1978   REPAIR KNEE LIGAMENT Bilateral    acl   TRIGGER FINGER RELEASE Bilateral    thumbs   Patient Active Problem List   Diagnosis Date Noted   Postoperative hematoma of musculoskeletal structure following musculoskeletal procedure 08/01/2022   Cervical myelopathy  ([place]) 07/30/2022   Cervical spinal stenosis 07/30/2022   Radiculopathy, cervical 07/30/2022   S/P cervical spinal fusion 07/30/2022   Vitamin D deficiency 06/25/2022   B12 deficiency 06/25/2022   Elevated LDL cholesterol level 06/25/2022   Prediabetes 03/27/2021   History of nausea- reported 01/27/2020 02/02/2020   Accidental fall from ladder 02/02/2020   Acute right-sided thoracic back pain 02/02/2020   Neck stiffness 02/02/2020   Concussion with no loss of consciousness 02/02/2020   Multiple falls 02/02/2020   Head trauma, initial encounter 02/02/2020   Acute post-traumatic headache, not intractable 02/02/2020   Essential hypertension 08/26/2018   Pre-bariatric surgery nutrition evaluation 06/19/2018   Migraine with vertigo 01/05/2014   Arthralgia of multiple joints 04/28/2009   Carpal tunnel syndrome 08/30/2008    ONSET DATE: summer 2022  REFERRING DIAG: LE weakness and gait instability   THERAPY DIAG:  Muscle weakness (generalized)  Unsteadiness on feet  Difficulty in walking, not elsewhere classified  Rationale for Evaluation and Treatment: Rehabilitation  SUBJECTIVE:  SUBJECTIVE STATEMENT: Patient presents with wife for LE weakness and instability.  Pt accompanied by: significant other  PERTINENT HISTORY:  Patient presents for LE weakness and gait instability. He is s/p ACDF C-7 on 07/30/22. Patient has a leg length discrepancy and limited L knee flexion since his 20's. Patient has a lot of atrophy and lower back pain, foot drop bilaterally.  First symptoms was stumbling up stairs, with LLE. Foot drop was improved after ACDF. Now has numbness and tingling at the end of the day. Does have a few lesions in his pons per wife, on blood pressure medication now. PMH includes ADHD, arthritis,  concussion COVID, HTN, multiple lacunar infarcts, MVP, pernicious anemia, rocky mountain spotted fever, spinal stenosis, vestibular migraine, vitamin B deficiency.   PAIN:  Are you having pain? Yes: NPRS scale: patient does not give number/10 Pain location: low back, L knee Pain description: aching Aggravating factors: prolonged walking Relieving factors: laying down  PRECAUTIONS: Other: cervical; no lifting more than 20 lb   WEIGHT BEARING RESTRICTIONS: No  FALLS: Has patient fallen in last 6 months? Yes. Number of falls multiple a week  LIVING ENVIRONMENT: Lives with: lives with their family Lives in: House/apartment Stairs: Yes: Internal: flight steps; is sleeping downstairs Has following equipment at home: Single point cane  PLOF: Independent, uses a walking stick  PATIENT GOALS: gait control of feet, strengthen legs, balance, atrophy in hand  OBJECTIVE:   DIAGNOSTIC FINDINGS:  09/11/22: No acute fracture. No spondylolisthesis. No compression deformities. Disc space narrowing C3-4 with marginal osteophytes consistent with degenerative disc disease. Postop changes ACDF C4-C7 with anatomic alignment. Normal prevertebral and cervicocranial soft tissues.   06/19/22: 1. L5 chronic pars defects with L5-S1 anterolisthesis and advanced disc degeneration causing marked biforaminal L5 impingement. 2. Degeneration and short pedicles causes compressive spinal stenosis from L1-2 to L3-4, most advanced at L2-3. Moderate bilateral foraminal narrowing at the same levels.   COGNITION: Overall cognitive status: Within functional limits for tasks assessed    COORDINATION: Limited LLE  MUSCLE LENGTH: Significant limitation L hamstring; 60 degree flexion deficit.   POSTURE:  weight shift to the R  LOWER EXTREMITY ROM:     L knee flexion contraction -60 contraction LOWER EXTREMITY MMT:    MMT Right Eval Left Eval  Hip flexion 4 4  Hip extension    Hip abduction 4- 4-  Hip  adduction 4- 4-  Hip internal rotation    Hip external rotation    Knee flexion 4- 3  Knee extension 4 4  Ankle dorsiflexion 3 2+  Ankle plantarflexion 3 3  Ankle inversion    Ankle eversion    (Blank rows = not tested)  BED MOBILITY:  Perform next session  TRANSFERS: Assistive device utilized: Single point cane  Sit to stand: CGA Stand to sit: CGA Chair to chair: CGA   GAIT: Gait pattern: L knee limited extension, L foot drop, L foot eversion Distance walked: 1200 Assistive device utilized: Single point cane Level of assistance: CGA Comments: Patient has increased eversion and rolling of L foot, LLE leg length discrepancy.   FUNCTIONAL TESTS:  5 times sit to stand: 5.6 seconds with minor use of LLE no UE support Timed up and go (TUG): n/a 6 minute walk test: 1200 ft 10 meter walk test: 7 seconds BERG: 36/56   PATIENT SURVEYS:  FOTO 54  TODAY'S TREATMENT:  DATE: 10/10/22  Evaluation Only    PATIENT EDUCATION: Education details: goals, POC Person educated: Patient and Spouse Education method: Explanation, Demonstration, Tactile cues, and Verbal cues Education comprehension: verbalized understanding, returned demonstration, verbal cues required, and tactile cues required  HOME EXERCISE PROGRAM: Give next session   GOALS: Goals reviewed with patient? Yes  SHORT TERM GOALS: Target date: 11/07/2022    Patient will be independent in home exercise program to improve strength/mobility for better functional independence with ADLs. Baseline: 2/21: HEP give next session Goal status: INITIAL    LONG TERM GOALS: Target date: 01/02/2023    Patient will increase FOTO score to equal to or greater than  64%   to demonstrate statistically significant improvement in mobility and quality of life.  Baseline: 2/21: 54% Goal status: INITIAL  2.   Patient will increase Berg Balance score by > 6 points (42/56)to demonstrate decreased fall risk during functional activities. Baseline: 2/21: 36/56 Goal status: INITIAL  3.  Patient will increase six minute walk test distance to >1500 for progression to age norm community ambulator and improve gait ability Baseline: 2/21: 1200 ft with SPC Goal status: INITIAL  4.  Patient will increase BLE gross strength to 4+/5 as to improve functional strength for independent gait, increased standing tolerance and increased ADL ability. Baseline: 2/21 :see above Goal status: INITIAL   ASSESSMENT:  CLINICAL IMPRESSION: Patient is a pleasant 61 y.o. male who was seen today for physical therapy evaluation and treatment for LE weakness. Patient has long standing impairment of LLE with knee flexion contraction. He additionally has a LLE leg length discrepancy utilizing a show lift within shoe. Patient does have extensive foot drop of LLE in addition to rolling of ankle with prolonged ambulation. Poor ankle righting reactions in combination with ankle and LE strength noted bilaterally with L worse than R. Patient will benefit from skilled physical therapy to improve strength, mobility, and stability for improved quality of life.   OBJECTIVE IMPAIRMENTS: Abnormal gait, decreased activity tolerance, decreased balance, decreased coordination, decreased endurance, decreased mobility, difficulty walking, decreased ROM, decreased strength, hypomobility, impaired flexibility, improper body mechanics, and postural dysfunction.   ACTIVITY LIMITATIONS: carrying, lifting, bending, standing, squatting, stairs, transfers, reach over head, locomotion level, and caring for others  PARTICIPATION LIMITATIONS: meal prep, cleaning, laundry, personal finances, driving, shopping, community activity, and yard work  PERSONAL FACTORS: Age, Past/current experiences, Time since onset of injury/illness/exacerbation, and 3+ comorbidities:  DHD, arthritis, concussion COVID, HTN, multiple lacunar infarcts, MVP, pernicious anemia, rocky mountain spotted fever, spinal stenosis, vestibular migraine, vitamin B deficiency.   are also affecting patient's functional outcome.   REHAB POTENTIAL: Good  CLINICAL DECISION MAKING: Evolving/moderate complexity  EVALUATION COMPLEXITY: Moderate  PLAN:  PT FREQUENCY: 1x/week  PT DURATION: 12 weeks  PLANNED INTERVENTIONS: Therapeutic exercises, Therapeutic activity, Neuromuscular re-education, Balance training, Gait training, Patient/Family education, Self Care, Joint mobilization, Stair training, Vestibular training, Canalith repositioning, Visual/preceptual remediation/compensation, Orthotic/Fit training, DME instructions, Cognitive remediation, Electrical stimulation, Spinal mobilization, Cryotherapy, Moist heat, Splintting, Taping, Traction, Ultrasound, Manual therapy, and Re-evaluation  PLAN FOR NEXT SESSION: stair negotiation, give HEP    Janna Arch, PT 10/10/2022, 5:43 PM

## 2022-10-09 NOTE — Telephone Encounter (Signed)
Spoke with patient's wife regarding bill for DOS 06/25/22. Explained to patient's wife that the claim was billed with the correct CPT code. Patient's wife stated that she thinks her husband may have had a CPE at another office and did not realize it. States she will contact their insurance company to find out why the CPE is being denied and if the service was billed by another office.

## 2022-10-10 ENCOUNTER — Ambulatory Visit: Payer: 59 | Attending: Neurosurgery

## 2022-10-10 ENCOUNTER — Encounter: Payer: Self-pay | Admitting: Psychology

## 2022-10-10 DIAGNOSIS — R2681 Unsteadiness on feet: Secondary | ICD-10-CM | POA: Diagnosis not present

## 2022-10-10 DIAGNOSIS — M6281 Muscle weakness (generalized): Secondary | ICD-10-CM | POA: Insufficient documentation

## 2022-10-10 DIAGNOSIS — R531 Weakness: Secondary | ICD-10-CM | POA: Diagnosis not present

## 2022-10-10 DIAGNOSIS — R262 Difficulty in walking, not elsewhere classified: Secondary | ICD-10-CM | POA: Diagnosis not present
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
## 2022-10-11 NOTE — Therapy (Signed)
OUTPATIENT PHYSICAL THERAPY NEURO TREATMENT   Patient Name: [name] MRN: [id number] DOB:02-Oct-1960, 62 y.o., male Today's Date: 10/15/2022   PCP: Jon Billings NP REFERRING PROVIDER: Loleta Dicker PA  END OF SESSION:  PT End of Session - 10/15/22 1348     Visit Number 2    Number of Visits 12    Date for PT Re-Evaluation 01/02/23    Authorization Type 2/10 eval 2/21    PT Start Time 1347    PT Stop Time [id number]    PT Time Calculation (min) 42 min    Equipment Utilized During Treatment Gait belt    Activity Tolerance Patient tolerated treatment well    Behavior During Therapy [facility] for tasks assessed/performed              Past Medical History:  Diagnosis Date   ADHD    Arthritis    Concussion    COVID 12/2020   Hypertension    Multiple lacunar infarcts ([place])    MVP (mitral valve prolapse)    Pernicious anemia    [facility] spotted fever    Spinal stenosis    Vestibular migraine    Vitamin B12 deficiency    Vitamin D deficiency    Past Surgical History:  Procedure Laterality Date   ANTERIOR CERVICAL DECOMP/DISCECTOMY FUSION N/A 07/30/2022   Procedure: C4-7 ANTERIOR CERVICAL DISCECTOMY AND FUSION (GLOBUS HEDRON);  Surgeon: Meade Maw, MD;  Location: [facility] ORS;  Service: Neurosurgery;  Laterality: N/A;   BILATERAL CARPAL TUNNEL RELEASE     CERVICAL WOUND DEBRIDEMENT N/A 08/01/2022   Procedure: CERVICAL WOUND DEBRIDEMENT OF HEMATOMA;  Surgeon: Meade Maw, MD;  Location: [facility] ORS;  Service: Neurosurgery;  Laterality: N/A;   FRACTURE SURGERY Left 1984   intramedullary rod   hip bone transplant  1984   LEG SURGERY Left    oseotomy   REPAIR ANKLE LIGAMENT Right 1978   REPAIR KNEE LIGAMENT Bilateral    acl   TRIGGER FINGER RELEASE Bilateral    thumbs   Patient Active Problem List   Diagnosis Date Noted   Postoperative hematoma of musculoskeletal structure following musculoskeletal procedure 08/01/2022   Cervical myelopathy  ([place]) 07/30/2022   Cervical spinal stenosis 07/30/2022   Radiculopathy, cervical 07/30/2022   S/P cervical spinal fusion 07/30/2022   Vitamin D deficiency 06/25/2022   B12 deficiency 06/25/2022   Elevated LDL cholesterol level 06/25/2022   Prediabetes 03/27/2021   History of nausea- reported 01/27/2020 02/02/2020   Accidental fall from ladder 02/02/2020   Acute right-sided thoracic back pain 02/02/2020   Neck stiffness 02/02/2020   Concussion with no loss of consciousness 02/02/2020   Multiple falls 02/02/2020   Head trauma, initial encounter 02/02/2020   Acute post-traumatic headache, not intractable 02/02/2020   Essential hypertension 08/26/2018   Pre-bariatric surgery nutrition evaluation 06/19/2018   Migraine with vertigo 01/05/2014   Arthralgia of multiple joints 04/28/2009   Carpal tunnel syndrome 08/30/2008    ONSET DATE: summer 2022  REFERRING DIAG: LE weakness and gait instability   THERAPY DIAG:  Muscle weakness (generalized)  Unsteadiness on feet  Difficulty in walking, not elsewhere classified  Rationale for Evaluation and Treatment: Rehabilitation  SUBJECTIVE:  SUBJECTIVE STATEMENT: Patient reports his back has been behaving, no issues since last seen.   Pt accompanied by: significant other  PERTINENT HISTORY:  Patient presents for LE weakness and gait instability. He is s/p ACDF C-7 on 07/30/22. Patient has a leg length discrepancy and limited L knee flexion since his 20's. Patient has a lot of atrophy and lower back pain, foot drop bilaterally.  First symptoms was stumbling up stairs, with LLE. Foot drop was improved after ACDF. Now has numbness and tingling at the end of the day. Does have a few lesions in his pons per wife, on blood pressure medication now. PMH includes  ADHD, arthritis, concussion COVID, HTN, multiple lacunar infarcts, MVP, pernicious anemia, rocky mountain spotted fever, spinal stenosis, vestibular migraine, vitamin B deficiency.   PAIN:  Are you having pain? Yes: NPRS scale: patient does not give number/10 Pain location: low back, L knee Pain description: aching Aggravating factors: prolonged walking Relieving factors: laying down  PRECAUTIONS: Other: cervical; no lifting more than 20 lb   WEIGHT BEARING RESTRICTIONS: No  FALLS: Has patient fallen in last 6 months? Yes. Number of falls multiple a week  LIVING ENVIRONMENT: Lives with: lives with their family Lives in: House/apartment Stairs: Yes: Internal: flight steps; is sleeping downstairs Has following equipment at home: Single point cane  PLOF: Independent, uses a walking stick  PATIENT GOALS: gait control of feet, strengthen legs, balance, atrophy in hand  OBJECTIVE:   DIAGNOSTIC FINDINGS:  09/11/22: No acute fracture. No spondylolisthesis. No compression deformities. Disc space narrowing C3-4 with marginal osteophytes consistent with degenerative disc disease. Postop changes ACDF C4-C7 with anatomic alignment. Normal prevertebral and cervicocranial soft tissues.   06/19/22: 1. L5 chronic pars defects with L5-S1 anterolisthesis and advanced disc degeneration causing marked biforaminal L5 impingement. 2. Degeneration and short pedicles causes compressive spinal stenosis from L1-2 to L3-4, most advanced at L2-3. Moderate bilateral foraminal narrowing at the same levels.   COGNITION: Overall cognitive status: Within functional limits for tasks assessed    COORDINATION: Limited LLE  MUSCLE LENGTH: Significant limitation L hamstring; 60 degree flexion deficit.   POSTURE:  weight shift to the R  LOWER EXTREMITY ROM:     L knee flexion contraction -60 contraction LOWER EXTREMITY MMT:    MMT Right Eval Left Eval  Hip flexion 4 4  Hip extension    Hip  abduction 4- 4-  Hip adduction 4- 4-  Hip internal rotation    Hip external rotation    Knee flexion 4- 3  Knee extension 4 4  Ankle dorsiflexion 3 2+  Ankle plantarflexion 3 3  Ankle inversion    Ankle eversion    (Blank rows = not tested)  BED MOBILITY:  Perform next session  TRANSFERS: Assistive device utilized: Single point cane  Sit to stand: CGA Stand to sit: CGA Chair to chair: CGA   GAIT: Gait pattern: L knee limited extension, L foot drop, L foot eversion Distance walked: 1200 Assistive device utilized: Single point cane Level of assistance: CGA Comments: Patient has increased eversion and rolling of L foot, LLE leg length discrepancy.   FUNCTIONAL TESTS:  5 times sit to stand: 5.6 seconds with minor use of LLE no UE support Timed up and go (TUG): n/a 6 minute walk test: 1200 ft 10 meter walk test: 7 seconds BERG: 36/56   PATIENT SURVEYS:  FOTO 54  TODAY'S TREATMENT:  DATE: 10/15/22  Review HEP -changed alphabet to writing a paragraph for increased value.  Neuro RE-ed:  Standing with CGA next to support surface:  Airex pad: static stand 30 seconds x 2 trials, noticeable trembling of ankles/LE's with fatigue and challenge to maintain stability Airex pad: horizontal head turns 30 seconds scanning room 10x ; cueing for arc of motion  Airex pad: vertical head turns 30 seconds, cueing for arc of motion, noticeable sway with upward gaze increasing demand on ankle righting reaction musculature Airex pad: one foot on 6" step one foot on airex pad, hold position for 30 seconds, switch legs, 2x each LE;  Airex balance beam: -lateral stepping 4x length of // bars cues for decreased speed -tandem walking 4x length of // bars with UE support   Stairs: Step to pattern with BUE support, safe and secure.    PATIENT EDUCATION: Education  details: goals, POC Person educated: Patient and Spouse Education method: Explanation, Demonstration, Tactile cues, and Verbal cues Education comprehension: verbalized understanding, returned demonstration, verbal cues required, and tactile cues required  HOME EXERCISE PROGRAM: Access Code: [email or web address] URL: [email or web address] Date: 10/11/2022 Prepared by: Janna Arch  Exercises - Seated Ankle Alphabet  - 1 x daily - 7 x weekly - 2 sets - 10 reps - 5 hold - Seated Heel Toe Raises  - 1 x daily - 7 x weekly - 2 sets - 10 reps - 5 hold - Standing March with Counter Support  - 1 x daily - 7 x weekly - 2 sets - 10 reps - 5 hold  GOALS: Goals reviewed with patient? Yes  SHORT TERM GOALS: Target date: 11/07/2022    Patient will be independent in home exercise program to improve strength/mobility for better functional independence with ADLs. Baseline: 2/21: HEP give next session Goal status: INITIAL    LONG TERM GOALS: Target date: 01/02/2023    Patient will increase FOTO score to equal to or greater than  64%   to demonstrate statistically significant improvement in mobility and quality of life.  Baseline: 2/21: 54% Goal status: INITIAL  2.  Patient will increase Berg Balance score by > 6 points (42/56)to demonstrate decreased fall risk during functional activities. Baseline: 2/21: 36/56 Goal status: INITIAL  3.  Patient will increase six minute walk test distance to >1500 for progression to age norm community ambulator and improve gait ability Baseline: 2/21: 1200 ft with SPC Goal status: INITIAL  4.  Patient will increase BLE gross strength to 4+/5 as to improve functional strength for independent gait, increased standing tolerance and increased ADL ability. Baseline: 2/21 :see above Goal status: INITIAL   ASSESSMENT:  CLINICAL IMPRESSION: Patient educated on HEP with demonstration of understanding. Seated alphabet changed to seated paragraph writing.  Patient tolerates progressive stabilization exercises with minimal pain  Patient tolerate stairs with step to pattern with UE support. Clear to perform at home with well light situation and when not fatigued. Patient will benefit from skilled physical therapy to improve strength, mobility, and stability for improved quality of life.   OBJECTIVE IMPAIRMENTS: Abnormal gait, decreased activity tolerance, decreased balance, decreased coordination, decreased endurance, decreased mobility, difficulty walking, decreased ROM, decreased strength, hypomobility, impaired flexibility, improper body mechanics, and postural dysfunction.   ACTIVITY LIMITATIONS: carrying, lifting, bending, standing, squatting, stairs, transfers, reach over head, locomotion level, and caring for others  PARTICIPATION LIMITATIONS: meal prep, cleaning, laundry, personal finances, driving, shopping, community activity, and yard work  PERSONAL FACTORS: Age, Past/current experiences, Time since onset of  injury/illness/exacerbation, and 3+ comorbidities: DHD, arthritis, concussion COVID, HTN, multiple lacunar infarcts, MVP, pernicious anemia, rocky mountain spotted fever, spinal stenosis, vestibular migraine, vitamin B deficiency.   are also affecting patient's functional outcome.   REHAB POTENTIAL: Good  CLINICAL DECISION MAKING: Evolving/moderate complexity  EVALUATION COMPLEXITY: Moderate  PLAN:  PT FREQUENCY: 1x/week  PT DURATION: 12 weeks  PLANNED INTERVENTIONS: Therapeutic exercises, Therapeutic activity, Neuromuscular re-education, Balance training, Gait training, Patient/Family education, Self Care, Joint mobilization, Stair training, Vestibular training, Canalith repositioning, Visual/preceptual remediation/compensation, Orthotic/Fit training, DME instructions, Cognitive remediation, Electrical stimulation, Spinal mobilization, Cryotherapy, Moist heat, Splintting, Taping, Traction, Ultrasound, Manual therapy, and  Re-evaluation  PLAN FOR NEXT SESSION: stair negotiation, give HEP    Janna Arch, PT 10/15/2022, 3:21 PM

## 2022-10-14 ENCOUNTER — Encounter: Payer: Self-pay | Admitting: Neurosurgery

## 2022-10-15 ENCOUNTER — Ambulatory Visit: Payer: 59

## 2022-10-15 DIAGNOSIS — R531 Weakness: Secondary | ICD-10-CM | POA: Diagnosis not present

## 2022-10-15 DIAGNOSIS — R2681 Unsteadiness on feet: Secondary | ICD-10-CM | POA: Diagnosis not present

## 2022-10-15 DIAGNOSIS — M6281 Muscle weakness (generalized): Secondary | ICD-10-CM

## 2022-10-15 DIAGNOSIS — R262 Difficulty in walking, not elsewhere classified: Secondary | ICD-10-CM

## 2022-10-15 NOTE — Telephone Encounter (Signed)
[name] calling to make sure that she can get a response today.

## 2022-10-15 NOTE — Telephone Encounter (Signed)
07/30/22  ACDF C4-C7 08/01/22 Exploration of anterior neck wound

## 2022-10-22 ENCOUNTER — Other Ambulatory Visit: Payer: Self-pay

## 2022-10-22 DIAGNOSIS — Z981 Arthrodesis status: Secondary | ICD-10-CM

## 2022-10-23 ENCOUNTER — Ambulatory Visit (INDEPENDENT_AMBULATORY_CARE_PROVIDER_SITE_OTHER): Payer: 59 | Admitting: Neurosurgery

## 2022-10-23 ENCOUNTER — Ambulatory Visit
Admission: RE | Admit: 2022-10-23 | Discharge: 2022-10-23 | Disposition: A | Discharge status: Home / Self Care | Payer: 59 | Attending: Neurosurgery | Admitting: Neurosurgery

## 2022-10-23 ENCOUNTER — Ambulatory Visit
Admission: RE | Admit: 2022-10-23 | Discharge: 2022-10-23 | Disposition: A | Discharge status: Home / Self Care | Payer: 59 | Source: Ambulatory Visit | Attending: Neurosurgery | Admitting: Neurosurgery

## 2022-10-23 ENCOUNTER — Encounter: Payer: Self-pay | Admitting: Neurosurgery

## 2022-10-23 VITALS — BP 124/78 | HR 88 | Ht 68.0 in | Wt 153.4 lb

## 2022-10-23 DIAGNOSIS — Z981 Arthrodesis status: Secondary | ICD-10-CM

## 2022-10-23 DIAGNOSIS — Z9889 Other specified postprocedural states: Secondary | ICD-10-CM | POA: Diagnosis not present

## 2022-10-23 DIAGNOSIS — M4322 Fusion of spine, cervical region: Secondary | ICD-10-CM | POA: Diagnosis not present

## 2022-10-23 DIAGNOSIS — M4306 Spondylolysis, lumbar region: Secondary | ICD-10-CM

## 2022-10-23 DIAGNOSIS — Z09 Encounter for follow-up examination after completed treatment for conditions other than malignant neoplasm: Secondary | ICD-10-CM

## 2022-10-23 NOTE — Progress Notes (Signed)
   REFERRING PHYSICIAN:  Jon Billings, [address],  [place] [id number]  DOS: 07/30/22  ACDF C4-C7           08/01/22  Exploration of anterior neck wound  HISTORY OF PRESENT ILLNESS: KATE STONEBURNER is status post ACDF C4-C7.   He is doing very well.  He has minimal pain.  He has had some back pain.  It is not currently activity limiting.  He feels stronger than he did prior to surgery.  PHYSICAL EXAMINATION:  NEUROLOGICAL:  General: In no acute distress.   Awake, alert, oriented to person, place, and time.  Pupils equal round and reactive to light.  Facial tone is symmetric.    Strength: Side Biceps Triceps Deltoid Interossei Grip Wrist Ext. Wrist Flex.  R '5 5 5 5 5 5 5  '$ L '5 5 5 5 5 5 5   '$ Incision c/d/I, minimal bruising.   Imaging:  No complications noted  Assessment / Plan: ANNAN LEMARR is doing well s/p above surgeries.  I am very pleased with his response to surgery.  He is off activity limitations.  I will see him back in 6 months with x-rays.  He does have a pars defect at L5.  If he develops symptoms from this, I will be happy to start him on conservative management.     Meade Maw MD Dept of Neurosurgery

## 2022-10-24 NOTE — Therapy (Signed)
OUTPATIENT PHYSICAL THERAPY NEURO TREATMENT   Patient Name: [name] MRN: [id number] DOB:03-26-1961, 62 y.o., male Today's Date: 10/25/2022   PCP: Jon Billings NP REFERRING PROVIDER: Loleta Dicker PA  END OF SESSION:  PT End of Session - 10/25/22 1059     Visit Number 3    Number of Visits 12    Date for PT Re-Evaluation 01/02/23    Authorization Type 3/10 eval 2/21    PT Start Time 1100    PT Stop Time [id number]    PT Time Calculation (min) 44 min    Equipment Utilized During Treatment Gait belt    Activity Tolerance Patient tolerated treatment well    Behavior During Therapy WFL for tasks assessed/performed               Past Medical History:  Diagnosis Date   ADHD    Arthritis    Concussion    COVID 12/2020   Hypertension    Multiple lacunar infarcts ([place])    MVP (mitral valve prolapse)    Pernicious anemia    [facility] spotted fever    Spinal stenosis    Vestibular migraine    Vitamin B12 deficiency    Vitamin D deficiency    Past Surgical History:  Procedure Laterality Date   ANTERIOR CERVICAL DECOMP/DISCECTOMY FUSION N/A 07/30/2022   Procedure: C4-7 ANTERIOR CERVICAL DISCECTOMY AND FUSION (GLOBUS HEDRON);  Surgeon: Meade Maw, MD;  Location: [facility] ORS;  Service: Neurosurgery;  Laterality: N/A;   BILATERAL CARPAL TUNNEL RELEASE     CERVICAL WOUND DEBRIDEMENT N/A 08/01/2022   Procedure: CERVICAL WOUND DEBRIDEMENT OF HEMATOMA;  Surgeon: Meade Maw, MD;  Location: [facility] ORS;  Service: Neurosurgery;  Laterality: N/A;   FRACTURE SURGERY Left 1984   intramedullary rod   hip bone transplant  1984   LEG SURGERY Left    oseotomy   REPAIR ANKLE LIGAMENT Right 1978   REPAIR KNEE LIGAMENT Bilateral    acl   TRIGGER FINGER RELEASE Bilateral    thumbs   Patient Active Problem List   Diagnosis Date Noted   Postoperative hematoma of musculoskeletal structure following musculoskeletal procedure 08/01/2022   Cervical myelopathy  ([place]) 07/30/2022   Cervical spinal stenosis 07/30/2022   Radiculopathy, cervical 07/30/2022   S/P cervical spinal fusion 07/30/2022   Vitamin D deficiency 06/25/2022   B12 deficiency 06/25/2022   Elevated LDL cholesterol level 06/25/2022   Prediabetes 03/27/2021   History of nausea- reported 01/27/2020 02/02/2020   Accidental fall from ladder 02/02/2020   Acute right-sided thoracic back pain 02/02/2020   Neck stiffness 02/02/2020   Concussion with no loss of consciousness 02/02/2020   Multiple falls 02/02/2020   Head trauma, initial encounter 02/02/2020   Acute post-traumatic headache, not intractable 02/02/2020   Essential hypertension 08/26/2018   Pre-bariatric surgery nutrition evaluation 06/19/2018   Migraine with vertigo 01/05/2014   Arthralgia of multiple joints 04/28/2009   Carpal tunnel syndrome 08/30/2008    ONSET DATE: summer 2022  REFERRING DIAG: LE weakness and gait instability   THERAPY DIAG:  Muscle weakness (generalized)  Unsteadiness on feet  Difficulty in walking, not elsewhere classified  Rationale for Evaluation and Treatment: Rehabilitation  SUBJECTIVE:  SUBJECTIVE STATEMENT: Patient has been walking at home and in the woods without LOB.   Pt accompanied by: significant other  PERTINENT HISTORY:  Patient presents for LE weakness and gait instability. He is s/p ACDF C-7 on 07/30/22. Patient has a leg length discrepancy and limited L knee flexion since his 20's. Patient has a lot of atrophy and lower back pain, foot drop bilaterally.  First symptoms was stumbling up stairs, with LLE. Foot drop was improved after ACDF. Now has numbness and tingling at the end of the day. Does have a few lesions in his pons per wife, on blood pressure medication now. PMH includes ADHD,  arthritis, concussion COVID, HTN, multiple lacunar infarcts, MVP, pernicious anemia, rocky mountain spotted fever, spinal stenosis, vestibular migraine, vitamin B deficiency.   PAIN:  Are you having pain? Yes: NPRS scale: patient does not give number/10 Pain location: low back, L knee Pain description: aching Aggravating factors: prolonged walking Relieving factors: laying down  PRECAUTIONS: Other: cervical; no lifting more than 20 lb   WEIGHT BEARING RESTRICTIONS: No  FALLS: Has patient fallen in last 6 months? Yes. Number of falls multiple a week  LIVING ENVIRONMENT: Lives with: lives with their family Lives in: House/apartment Stairs: Yes: Internal: flight steps; is sleeping downstairs Has following equipment at home: Single point cane  PLOF: Independent, uses a walking stick  PATIENT GOALS: gait control of feet, strengthen legs, balance, atrophy in hand  OBJECTIVE:   DIAGNOSTIC FINDINGS:  09/11/22: No acute fracture. No spondylolisthesis. No compression deformities. Disc space narrowing C3-4 with marginal osteophytes consistent with degenerative disc disease. Postop changes ACDF C4-C7 with anatomic alignment. Normal prevertebral and cervicocranial soft tissues.   06/19/22: 1. L5 chronic pars defects with L5-S1 anterolisthesis and advanced disc degeneration causing marked biforaminal L5 impingement. 2. Degeneration and short pedicles causes compressive spinal stenosis from L1-2 to L3-4, most advanced at L2-3. Moderate bilateral foraminal narrowing at the same levels.   COGNITION: Overall cognitive status: Within functional limits for tasks assessed    COORDINATION: Limited LLE  MUSCLE LENGTH: Significant limitation L hamstring; 60 degree flexion deficit.   POSTURE:  weight shift to the R  LOWER EXTREMITY ROM:     L knee flexion contraction -60 contraction LOWER EXTREMITY MMT:    MMT Right Eval Left Eval  Hip flexion 4 4  Hip extension    Hip abduction  4- 4-  Hip adduction 4- 4-  Hip internal rotation    Hip external rotation    Knee flexion 4- 3  Knee extension 4 4  Ankle dorsiflexion 3 2+  Ankle plantarflexion 3 3  Ankle inversion    Ankle eversion    (Blank rows = not tested)  BED MOBILITY:  Perform next session  TRANSFERS: Assistive device utilized: Single point cane  Sit to stand: CGA Stand to sit: CGA Chair to chair: CGA   GAIT: Gait pattern: L knee limited extension, L foot drop, L foot eversion Distance walked: 1200 Assistive device utilized: Single point cane Level of assistance: CGA Comments: Patient has increased eversion and rolling of L foot, LLE leg length discrepancy.   FUNCTIONAL TESTS:  5 times sit to stand: 5.6 seconds with minor use of LLE no UE support Timed up and go (TUG): n/a 6 minute walk test: 1200 ft 10 meter walk test: 7 seconds BERG: 36/56   PATIENT SURVEYS:  FOTO 54  TODAY'S TREATMENT:  DATE: 10/25/22   Neuro RE-ed:  Standing with CGA next to support surface:  Half foam roller: -standing df/pf with UE support 10x  -seated df/pf 20x  Airex pad: dual task with word game, visual scanning x4 minutes Airex pad; dynadisc 30 tandem stance x2 trials each LE  Airex pad: soccer ball circles 10x each side  Bosu round side up: -lateral modified lunge 10x each LE -modified forward lunge 10x each side  Bosu: flat side up: static stand 60 seconds  In hallway: Horizontal, vertical head turns based on PT guidance 4x 100 ft with [facility]     PATIENT EDUCATION: Education details: goals, POC Person educated: Patient and Spouse Education method: Explanation, Demonstration, Tactile cues, and Verbal cues Education comprehension: verbalized understanding, returned demonstration, verbal cues required, and tactile cues required  HOME EXERCISE PROGRAM: Access Code:  [email or web address] URL: [email or web address] Date: 10/11/2022 Prepared by: Janna Arch  Exercises - Seated Ankle Alphabet  - 1 x daily - 7 x weekly - 2 sets - 10 reps - 5 hold - Seated Heel Toe Raises  - 1 x daily - 7 x weekly - 2 sets - 10 reps - 5 hold - Standing March with Counter Support  - 1 x daily - 7 x weekly - 2 sets - 10 reps - 5 hold  GOALS: Goals reviewed with patient? Yes  SHORT TERM GOALS: Target date: 11/07/2022    Patient will be independent in home exercise program to improve strength/mobility for better functional independence with ADLs. Baseline: 2/21: HEP give next session Goal status: INITIAL    LONG TERM GOALS: Target date: 01/02/2023    Patient will increase FOTO score to equal to or greater than  64%   to demonstrate statistically significant improvement in mobility and quality of life.  Baseline: 2/21: 54% Goal status: INITIAL  2.  Patient will increase Berg Balance score by > 6 points (42/56)to demonstrate decreased fall risk during functional activities. Baseline: 2/21: 36/56 Goal status: INITIAL  3.  Patient will increase six minute walk test distance to >1500 for progression to age norm community ambulator and improve gait ability Baseline: 2/21: 1200 ft with SPC Goal status: INITIAL  4.  Patient will increase BLE gross strength to 4+/5 as to improve functional strength for independent gait, increased standing tolerance and increased ADL ability. Baseline: 2/21 :see above Goal status: INITIAL   ASSESSMENT:  CLINICAL IMPRESSION: Patient presents with excellent motivation. He tolerates stable and unstable surfaces with minimal LOB. His ankle righting reactions continue to be an area of progress at this time.Limited df in seated and standing noted with half foam roller. Introduction to bosu ball tolerated well with step by step cueing for safety.  Patient will benefit from skilled physical therapy to improve strength, mobility, and  stability for improved quality of life.   OBJECTIVE IMPAIRMENTS: Abnormal gait, decreased activity tolerance, decreased balance, decreased coordination, decreased endurance, decreased mobility, difficulty walking, decreased ROM, decreased strength, hypomobility, impaired flexibility, improper body mechanics, and postural dysfunction.   ACTIVITY LIMITATIONS: carrying, lifting, bending, standing, squatting, stairs, transfers, reach over head, locomotion level, and caring for others  PARTICIPATION LIMITATIONS: meal prep, cleaning, laundry, personal finances, driving, shopping, community activity, and yard work  PERSONAL FACTORS: Age, Past/current experiences, Time since onset of injury/illness/exacerbation, and 3+ comorbidities: DHD, arthritis, concussion COVID, HTN, multiple lacunar infarcts, MVP, pernicious anemia, rocky mountain spotted fever, spinal stenosis, vestibular migraine, vitamin B deficiency.   are also affecting patient's functional outcome.   REHAB POTENTIAL: Good  CLINICAL DECISION MAKING: Evolving/moderate complexity  EVALUATION COMPLEXITY: Moderate  PLAN:  PT FREQUENCY: 1x/week  PT DURATION: 12 weeks  PLANNED INTERVENTIONS: Therapeutic exercises, Therapeutic activity, Neuromuscular re-education, Balance training, Gait training, Patient/Family education, Self Care, Joint mobilization, Stair training, Vestibular training, Canalith repositioning, Visual/preceptual remediation/compensation, Orthotic/Fit training, DME instructions, Cognitive remediation, Electrical stimulation, Spinal mobilization, Cryotherapy, Moist heat, Splintting, Taping, Traction, Ultrasound, Manual therapy, and Re-evaluation  PLAN FOR NEXT SESSION: stair negotiation, give HEP    Janna Arch, PT 10/25/2022, 12:07 PM

## 2022-10-25 ENCOUNTER — Ambulatory Visit: Payer: 59 | Attending: Neurosurgery

## 2022-10-25 ENCOUNTER — Ambulatory Visit: Payer: 59

## 2022-10-25 DIAGNOSIS — R262 Difficulty in walking, not elsewhere classified: Secondary | ICD-10-CM | POA: Diagnosis not present

## 2022-10-25 DIAGNOSIS — M6281 Muscle weakness (generalized): Secondary | ICD-10-CM | POA: Diagnosis not present

## 2022-10-25 DIAGNOSIS — R2681 Unsteadiness on feet: Secondary | ICD-10-CM | POA: Diagnosis not present

## 2022-10-25 DIAGNOSIS — G8929 Other chronic pain: Secondary | ICD-10-CM | POA: Diagnosis not present

## 2022-10-25 DIAGNOSIS — M5442 Lumbago with sciatica, left side: Secondary | ICD-10-CM | POA: Diagnosis not present

## 2022-10-25 DIAGNOSIS — M48062 Spinal stenosis, lumbar region with neurogenic claudication: Secondary | ICD-10-CM | POA: Diagnosis not present

## 2022-10-26 ENCOUNTER — Ambulatory Visit: Payer: 59

## 2022-10-29 ENCOUNTER — Encounter: Payer: 59 | Attending: Psychology | Admitting: Psychology

## 2022-10-29 DIAGNOSIS — F840 Autistic disorder: Secondary | ICD-10-CM | POA: Insufficient documentation

## 2022-10-29 DIAGNOSIS — R41840 Attention and concentration deficit: Secondary | ICD-10-CM | POA: Diagnosis not present

## 2022-10-29 DIAGNOSIS — G3184 Mild cognitive impairment, so stated: Secondary | ICD-10-CM | POA: Insufficient documentation

## 2022-10-29 DIAGNOSIS — R413 Other amnesia: Secondary | ICD-10-CM | POA: Insufficient documentation

## 2022-10-29 NOTE — Therapy (Signed)
OUTPATIENT PHYSICAL THERAPY NEURO TREATMENT   Patient Name: [name] MRN: [id number] DOB:1961/02/14, 62 y.o., male Today's Date: 10/30/2022   PCP: Jon Billings NP REFERRING PROVIDER: Loleta Dicker PA  END OF SESSION:  PT End of Session - 10/30/22 1645     Visit Number 4    Number of Visits 12    Date for PT Re-Evaluation 01/02/23    Authorization Type 4/10 eval 2/21    PT Start Time 1645    PT Stop Time 47    PT Time Calculation (min) 43 min    Equipment Utilized During Treatment Gait belt    Activity Tolerance Patient tolerated treatment well    Behavior During Therapy WFL for tasks assessed/performed                Past Medical History:  Diagnosis Date   ADHD    Arthritis    Concussion    COVID 12/2020   Hypertension    Multiple lacunar infarcts ([place])    MVP (mitral valve prolapse)    Pernicious anemia    [facility] spotted fever    Spinal stenosis    Vestibular migraine    Vitamin B12 deficiency    Vitamin D deficiency    Past Surgical History:  Procedure Laterality Date   ANTERIOR CERVICAL DECOMP/DISCECTOMY FUSION N/A 07/30/2022   Procedure: C4-7 ANTERIOR CERVICAL DISCECTOMY AND FUSION (GLOBUS HEDRON);  Surgeon: Meade Maw, MD;  Location: [facility] ORS;  Service: Neurosurgery;  Laterality: N/A;   BILATERAL CARPAL TUNNEL RELEASE     CERVICAL WOUND DEBRIDEMENT N/A 08/01/2022   Procedure: CERVICAL WOUND DEBRIDEMENT OF HEMATOMA;  Surgeon: Meade Maw, MD;  Location: [facility] ORS;  Service: Neurosurgery;  Laterality: N/A;   FRACTURE SURGERY Left 1984   intramedullary rod   hip bone transplant  1984   LEG SURGERY Left    oseotomy   REPAIR ANKLE LIGAMENT Right 1978   REPAIR KNEE LIGAMENT Bilateral    acl   TRIGGER FINGER RELEASE Bilateral    thumbs   Patient Active Problem List   Diagnosis Date Noted   Postoperative hematoma of musculoskeletal structure following musculoskeletal procedure 08/01/2022   Cervical  myelopathy ([place]) 07/30/2022   Cervical spinal stenosis 07/30/2022   Radiculopathy, cervical 07/30/2022   S/P cervical spinal fusion 07/30/2022   Vitamin D deficiency 06/25/2022   B12 deficiency 06/25/2022   Elevated LDL cholesterol level 06/25/2022   Prediabetes 03/27/2021   History of nausea- reported 01/27/2020 02/02/2020   Accidental fall from ladder 02/02/2020   Acute right-sided thoracic back pain 02/02/2020   Neck stiffness 02/02/2020   Concussion with no loss of consciousness 02/02/2020   Multiple falls 02/02/2020   Head trauma, initial encounter 02/02/2020   Acute post-traumatic headache, not intractable 02/02/2020   Essential hypertension 08/26/2018   Pre-bariatric surgery nutrition evaluation 06/19/2018   Migraine with vertigo 01/05/2014   Arthralgia of multiple joints 04/28/2009   Carpal tunnel syndrome 08/30/2008    ONSET DATE: summer 2022  REFERRING DIAG: LE weakness and gait instability   THERAPY DIAG:  Muscle weakness (generalized)  Unsteadiness on feet  Difficulty in walking, not elsewhere classified  Rationale for Evaluation and Treatment: Rehabilitation  SUBJECTIVE:  SUBJECTIVE STATEMENT: Patient went to Dr. Brigitte Pulse yesterday, reports he wants more benchmark measures of lower extremities.   Pt accompanied by: significant other  PERTINENT HISTORY:  Patient presents for LE weakness and gait instability. He is s/p ACDF C-7 on 07/30/22. Patient has a leg length discrepancy and limited L knee flexion since his 20's. Patient has a lot of atrophy and lower back pain, foot drop bilaterally.  First symptoms was stumbling up stairs, with LLE. Foot drop was improved after ACDF. Now has numbness and tingling at the end of the day. Does have a few lesions in his pons per wife, on blood  pressure medication now. PMH includes ADHD, arthritis, concussion COVID, HTN, multiple lacunar infarcts, MVP, pernicious anemia, rocky mountain spotted fever, spinal stenosis, vestibular migraine, vitamin B deficiency.   PAIN:  Are you having pain? Yes: NPRS scale: patient does not give number/10 Pain location: low back, L knee Pain description: aching Aggravating factors: prolonged walking Relieving factors: laying down  PRECAUTIONS: Other: cervical; no lifting more than 20 lb   WEIGHT BEARING RESTRICTIONS: No  FALLS: Has patient fallen in last 6 months? Yes. Number of falls multiple a week  LIVING ENVIRONMENT: Lives with: lives with their family Lives in: House/apartment Stairs: Yes: Internal: flight steps; is sleeping downstairs Has following equipment at home: Single point cane  PLOF: Independent, uses a walking stick  PATIENT GOALS: gait control of feet, strengthen legs, balance, atrophy in hand  OBJECTIVE:   DIAGNOSTIC FINDINGS:  09/11/22: No acute fracture. No spondylolisthesis. No compression deformities. Disc space narrowing C3-4 with marginal osteophytes consistent with degenerative disc disease. Postop changes ACDF C4-C7 with anatomic alignment. Normal prevertebral and cervicocranial soft tissues.   06/19/22: 1. L5 chronic pars defects with L5-S1 anterolisthesis and advanced disc degeneration causing marked biforaminal L5 impingement. 2. Degeneration and short pedicles causes compressive spinal stenosis from L1-2 to L3-4, most advanced at L2-3. Moderate bilateral foraminal narrowing at the same levels.   COGNITION: Overall cognitive status: Within functional limits for tasks assessed    COORDINATION: Limited LLE  MUSCLE LENGTH: Significant limitation L hamstring; 60 degree flexion deficit.   POSTURE:  weight shift to the R  LOWER EXTREMITY ROM:     L knee flexion contraction -60 contraction LOWER EXTREMITY MMT:    MMT Right Eval Left Eval  Hip  flexion 4 4  Hip extension    Hip abduction 4- 4-  Hip adduction 4- 4-  Hip internal rotation    Hip external rotation    Knee flexion 4- 3  Knee extension 4 4  Ankle dorsiflexion 3 2+  Ankle plantarflexion 3 3  Ankle inversion    Ankle eversion    (Blank rows = not tested)  BED MOBILITY:  Perform next session  TRANSFERS: Assistive device utilized: Single point cane  Sit to stand: CGA Stand to sit: CGA Chair to chair: CGA   GAIT: Gait pattern: L knee limited extension, L foot drop, L foot eversion Distance walked: 1200 Assistive device utilized: Single point cane Level of assistance: CGA Comments: Patient has increased eversion and rolling of L foot, LLE leg length discrepancy.   FUNCTIONAL TESTS:  5 times sit to stand: 5.6 seconds with minor use of LLE no UE support Timed up and go (TUG): n/a 6 minute walk test: 1200 ft 10 meter walk test: 7 seconds BERG: 36/56   PATIENT SURVEYS:  FOTO 54  TODAY'S TREATMENT:  DATE: 10/30/22   Neuro RE-ed:   Airex pad: dual task with word game, visual scanning x6 minutes Airex pad; 6" step 30 tandem stance x2 trials each LE    ambulate in hallway: with walking stick.  -horizontal head turns with cues for reading alphabet from cards 86 ftx 2 sets -horizontal head turns with cues for reading number and symbols from cards 86 ft x 2 sets  -"red light/green light" for sudden initiation/termination of ambulation with close CGA for carryover to natural environment 2x 86 ft    TherEx: RTB eversion 10x RTB plantarflexion 10x Soccer ball hamstring curl seated 10x each LE Soccer ball LAQ 10x  TrA contraction pressing into swiss ball 10x 3 second holds TrA contraction with alternating UE raises 10x each UE   PATIENT EDUCATION: Education details: goals, POC Person educated: Patient and Spouse Education  method: Explanation, Demonstration, Tactile cues, and Verbal cues Education comprehension: verbalized understanding, returned demonstration, verbal cues required, and tactile cues required  HOME EXERCISE PROGRAM: Access Code: [email or web address] URL: [email or web address] Date: 10/11/2022 Prepared by: Janna Arch  Exercises - Seated Ankle Alphabet  - 1 x daily - 7 x weekly - 2 sets - 10 reps - 5 hold - Seated Heel Toe Raises  - 1 x daily - 7 x weekly - 2 sets - 10 reps - 5 hold - Standing March with Counter Support  - 1 x daily - 7 x weekly - 2 sets - 10 reps - 5 hold  Access Code: [email or web address] URL: [email or web address] Date: 10/30/2022 Prepared by: Janna Arch  Exercises - Seated Ankle Plantarflexion with Resistance  - 1 x daily - 7 x weekly - 2 sets - 10 reps - 5 hold - Seated Ankle Eversion with Resistance  - 1 x daily - 7 x weekly - 2 sets - 10 reps - 5 hold  GOALS: Goals reviewed with patient? Yes  SHORT TERM GOALS: Target date: 11/07/2022    Patient will be independent in home exercise program to improve strength/mobility for better functional independence with ADLs. Baseline: 2/21: HEP give next session Goal status: INITIAL    LONG TERM GOALS: Target date: 01/02/2023    Patient will increase FOTO score to equal to or greater than  64%   to demonstrate statistically significant improvement in mobility and quality of life.  Baseline: 2/21: 54% Goal status: INITIAL  2.  Patient will increase Berg Balance score by > 6 points (42/56)to demonstrate decreased fall risk during functional activities. Baseline: 2/21: 36/56 Goal status: INITIAL  3.  Patient will increase six minute walk test distance to >1500 for progression to age norm community ambulator and improve gait ability Baseline: 2/21: 1200 ft with SPC Goal status: INITIAL  4.  Patient will increase BLE gross strength to 4+/5 as to improve functional strength for independent gait, increased  standing tolerance and increased ADL ability. Baseline: 2/21 :see above Goal status: INITIAL   ASSESSMENT:  CLINICAL IMPRESSION: Patient HEP progressed to add ankle stabilization and strengthening. Patient demonstrated understanding of home program. Introduction to core stabilization tolerated well for reduction of radiating back symptoms. Patient continues to have excellent motivation throughout session. Patient requires use of AD with head turns.  Patient will benefit from skilled physical therapy to improve strength, mobility, and stability for improved quality of life.   OBJECTIVE IMPAIRMENTS: Abnormal gait, decreased activity tolerance, decreased balance, decreased coordination, decreased endurance, decreased mobility, difficulty walking, decreased ROM, decreased strength, hypomobility, impaired flexibility, improper body mechanics, and postural dysfunction.  ACTIVITY LIMITATIONS: carrying, lifting, bending, standing, squatting, stairs, transfers, reach over head, locomotion level, and caring for others  PARTICIPATION LIMITATIONS: meal prep, cleaning, laundry, personal finances, driving, shopping, community activity, and yard work  PERSONAL FACTORS: Age, Past/current experiences, Time since onset of injury/illness/exacerbation, and 3+ comorbidities: DHD, arthritis, concussion COVID, HTN, multiple lacunar infarcts, MVP, pernicious anemia, rocky mountain spotted fever, spinal stenosis, vestibular migraine, vitamin B deficiency.   are also affecting patient's functional outcome.   REHAB POTENTIAL: Good  CLINICAL DECISION MAKING: Evolving/moderate complexity  EVALUATION COMPLEXITY: Moderate  PLAN:  PT FREQUENCY: 1x/week  PT DURATION: 12 weeks  PLANNED INTERVENTIONS: Therapeutic exercises, Therapeutic activity, Neuromuscular re-education, Balance training, Gait training, Patient/Family education, Self Care, Joint mobilization, Stair training, Vestibular training, Canalith  repositioning, Visual/preceptual remediation/compensation, Orthotic/Fit training, DME instructions, Cognitive remediation, Electrical stimulation, Spinal mobilization, Cryotherapy, Moist heat, Splintting, Taping, Traction, Ultrasound, Manual therapy, and Re-evaluation  PLAN FOR NEXT SESSION: stair negotiation, give HEP    Janna Arch, PT 10/30/2022, 8:24 PM

## 2022-10-30 ENCOUNTER — Ambulatory Visit (INDEPENDENT_AMBULATORY_CARE_PROVIDER_SITE_OTHER): Payer: 59 | Admitting: Nurse Practitioner

## 2022-10-30 ENCOUNTER — Encounter: Payer: Self-pay | Admitting: Nurse Practitioner

## 2022-10-30 ENCOUNTER — Encounter: Payer: Self-pay | Admitting: Psychology

## 2022-10-30 ENCOUNTER — Ambulatory Visit: Payer: 59

## 2022-10-30 VITALS — BP 118/78 | HR 88 | Temp 97.6°F | Wt 153.0 lb

## 2022-10-30 DIAGNOSIS — I10 Essential (primary) hypertension: Secondary | ICD-10-CM

## 2022-10-30 DIAGNOSIS — G629 Polyneuropathy, unspecified: Secondary | ICD-10-CM | POA: Diagnosis not present

## 2022-10-30 DIAGNOSIS — R2681 Unsteadiness on feet: Secondary | ICD-10-CM | POA: Diagnosis not present

## 2022-10-30 DIAGNOSIS — M6281 Muscle weakness (generalized): Secondary | ICD-10-CM

## 2022-10-30 DIAGNOSIS — Z8673 Personal history of transient ischemic attack (TIA), and cerebral infarction without residual deficits: Secondary | ICD-10-CM | POA: Diagnosis not present

## 2022-10-30 DIAGNOSIS — Z9889 Other specified postprocedural states: Secondary | ICD-10-CM | POA: Diagnosis not present

## 2022-10-30 DIAGNOSIS — R262 Difficulty in walking, not elsewhere classified: Secondary | ICD-10-CM | POA: Diagnosis not present

## 2022-10-30 DIAGNOSIS — E559 Vitamin D deficiency, unspecified: Secondary | ICD-10-CM | POA: Diagnosis not present

## 2022-10-30 DIAGNOSIS — E538 Deficiency of other specified B group vitamins: Secondary | ICD-10-CM | POA: Diagnosis not present

## 2022-10-30 DIAGNOSIS — G3184 Mild cognitive impairment, so stated: Secondary | ICD-10-CM | POA: Diagnosis not present

## 2022-10-30 DIAGNOSIS — G43119 Migraine with aura, intractable, without status migrainosus: Secondary | ICD-10-CM | POA: Diagnosis not present

## 2022-10-30 NOTE — Progress Notes (Signed)
BP 118/78   Pulse 88   Temp 97.6 F (36.4 C) (Oral)   Wt 153 lb (69.4 kg)   SpO2 98%   BMI 23.26 kg/m    Subjective:    Patient ID: [name], male    DOB: Sep 15, 1960, 62 y.o.   MRN: [id number]  HPI: [name] is a 62 y.o. male  Chief Complaint  Patient presents with   Hypertension   HYPERTENSION without Chronic Kidney Disease Hypertension status: controlled  Satisfied with current treatment? yes Duration of hypertension: years BP monitoring frequency:  daily BP range: 120/70 BP medication side effects:  no Medication compliance: excellent compliance Previous BP meds:lisinopril Aspirin: no Recurrent headaches: no Visual changes: no Palpitations: no Dyspnea: no Chest pain: no Lower extremity edema: no Dizzy/lightheaded: no    Relevant past medical, surgical, family and social history reviewed and updated as indicated. Interim medical history since our last visit reviewed.  Allergies and medications reviewed and updated.  Review of Systems  Eyes:  Negative for visual disturbance.  Respiratory:  Negative for shortness of breath.   Cardiovascular:  Negative for chest pain and leg swelling.  Neurological:  Negative for dizziness, light-headedness and headaches.    Per HPI unless specifically indicated above     Objective:    BP 118/78   Pulse 88   Temp 97.6 F (36.4 C) (Oral)   Wt 153 lb (69.4 kg)   SpO2 98%   BMI 23.26 kg/m   Wt Readings from Last 3 Encounters:  10/30/22 153 lb (69.4 kg)  10/23/22 153 lb 6.4 oz (69.6 kg)  09/27/22 151 lb 11.2 oz (68.8 kg)    Physical Exam Vitals and nursing note reviewed.  Constitutional:      General: He is not in acute distress.    Appearance: Normal appearance. He is not ill-appearing, toxic-appearing or diaphoretic.  HENT:     Head: Normocephalic.     Right Ear: External ear normal.     Left Ear: External ear normal.     Nose: Nose normal. No congestion or rhinorrhea.     Mouth/Throat:      Mouth: Mucous membranes are moist.  Eyes:     General:        Right eye: No discharge.        Left eye: No discharge.     Extraocular Movements: Extraocular movements intact.     Conjunctiva/sclera: Conjunctivae normal.     Pupils: Pupils are equal, round, and reactive to light.  Cardiovascular:     Rate and Rhythm: Normal rate and regular rhythm.     Heart sounds: No murmur heard. Pulmonary:     Effort: Pulmonary effort is normal. No respiratory distress.     Breath sounds: Normal breath sounds. No wheezing, rhonchi or rales.  Abdominal:     General: Abdomen is flat. Bowel sounds are normal.  Musculoskeletal:     Cervical back: Normal range of motion and neck supple.  Skin:    General: Skin is warm and dry.     Capillary Refill: Capillary refill takes less than 2 seconds.  Neurological:     General: No focal deficit present.     Mental Status: He is alert and oriented to person, place, and time.  Psychiatric:        Mood and Affect: Mood normal.        Behavior: Behavior normal.        Thought Content: Thought content normal.  Judgment: Judgment normal.     Results for orders placed or performed in visit on 09/27/22  Comp Met (CMET)  Result Value Ref Range   Glucose 94 70 - 99 mg/dL   BUN 7 (L) 8 - 27 mg/dL   Creatinine, Ser 0.79 0.76 - 1.27 mg/dL   eGFR 100 >59 mL/min/1.73   BUN/Creatinine Ratio 9 (L) 10 - 24   Sodium 140 134 - 144 mmol/L   Potassium 4.7 3.5 - 5.2 mmol/L   Chloride 101 96 - 106 mmol/L   CO2 24 20 - 29 mmol/L   Calcium 9.2 8.6 - 10.2 mg/dL   Total Protein 6.1 6.0 - 8.5 g/dL   Albumin 4.3 3.9 - 4.9 g/dL   Globulin, Total 1.8 1.5 - 4.5 g/dL   Albumin/Globulin Ratio 2.4 (H) 1.2 - 2.2   Bilirubin Total 0.7 0.0 - 1.2 mg/dL   Alkaline Phosphatase 90 44 - 121 IU/L   AST 16 0 - 40 IU/L   ALT 11 0 - 44 IU/L  Lipid Profile  Result Value Ref Range   Cholesterol, Total 203 (H) 100 - 199 mg/dL   Triglycerides 175 (H) 0 - 149 mg/dL   HDL 70 >39  mg/dL   VLDL Cholesterol Cal 30 5 - 40 mg/dL   LDL Chol Calc (NIH) 103 (H) 0 - 99 mg/dL   Chol/HDL Ratio 2.9 0.0 - 5.0 ratio  Vitamin D (25 hydroxy)  Result Value Ref Range   Vit D, 25-Hydroxy 40.9 30.0 - 100.0 ng/mL  B12  Result Value Ref Range   Vitamin B-12 >2000 (H) 232 - 1245 pg/mL  HgB A1c  Result Value Ref Range   Hgb A1c MFr Bld 5.8 (H) 4.8 - 5.6 %   Est. average glucose Bld gHb Est-mCnc 120 mg/dL  CBC w/Diff  Result Value Ref Range   WBC 6.0 3.4 - 10.8 x10E3/uL   RBC 4.69 4.14 - 5.80 x10E6/uL   Hemoglobin 14.3 13.0 - 17.7 g/dL   Hematocrit 42.7 37.5 - 51.0 %   MCV 91 79 - 97 fL   MCH 30.5 26.6 - 33.0 pg   MCHC 33.5 31.5 - 35.7 g/dL   RDW 12.4 11.6 - 15.4 %   Platelets 232 150 - 450 x10E3/uL   Neutrophils 61 Not Estab. %   Lymphs 30 Not Estab. %   Monocytes 7 Not Estab. %   Eos 1 Not Estab. %   Basos 1 Not Estab. %   Neutrophils Absolute 3.7 1.4 - 7.0 x10E3/uL   Lymphocytes Absolute 1.8 0.7 - 3.1 x10E3/uL   Monocytes Absolute 0.4 0.1 - 0.9 x10E3/uL   EOS (ABSOLUTE) 0.1 0.0 - 0.4 x10E3/uL   Basophils Absolute 0.0 0.0 - 0.2 x10E3/uL   Immature Granulocytes 0 Not Estab. %   Immature Grans (Abs) 0.0 0.0 - 0.1 x10E3/uL      Assessment & Plan:   Problem List Items Addressed This Visit       Cardiovascular and Mediastinum   Essential hypertension - Primary    Chronic.  Controlled.  Continue with current medication regimen of Lisinopril '20mg'$ .  Return to clinic in 2 months for reevaluation.  Call sooner if concerns arise.        Relevant Medications   aspirin EC 325 MG tablet     Other   B12 deficiency    >2000 one month ago.  Will back off injections to 3 months ago.  Follow up in 3 months. Will check labs and make a  plan from there.         Follow up plan: Return in about 2 months (around 12/30/2022) for BP Check and B12 shot.

## 2022-10-30 NOTE — Assessment & Plan Note (Signed)
>  2000 one month ago.  Will back off injections to 3 months ago.  Follow up in 3 months. Will check labs and make a plan from there.
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
## 2022-10-30 NOTE — Assessment & Plan Note (Signed)
Chronic.  Controlled.  Continue with current medication regimen of Lisinopril '20mg'$ .  Return to clinic in 2 months for reevaluation.  Call sooner if concerns arise.

## 2022-11-06 ENCOUNTER — Ambulatory Visit: Payer: 59

## 2022-11-13 NOTE — Therapy (Signed)
OUTPATIENT PHYSICAL THERAPY NEURO TREATMENT   Patient Name: [name] MRN: [id number] DOB:December 08, 1960, 62 y.o., male Today's Date: 11/14/2022   PCP: Jon Billings NP REFERRING PROVIDER: Loleta Dicker PA  END OF SESSION:  PT End of Session - 11/14/22 1602     Visit Number 5    Number of Visits 12    Date for PT Re-Evaluation 01/02/23    Authorization Type 5/10 eval 2/21    PT Start Time 1600    PT Stop Time 1644    PT Time Calculation (min) 44 min    Equipment Utilized During Treatment Gait belt    Activity Tolerance Patient tolerated treatment well    Behavior During Therapy [facility] for tasks assessed/performed                 Past Medical History:  Diagnosis Date   ADHD    Arthritis    Concussion    COVID 12/2020   Hypertension    Multiple lacunar infarcts ([place])    MVP (mitral valve prolapse)    Pernicious anemia    Procedure [facility] spotted fever    Spinal stenosis    Vestibular migraine    Vitamin B12 deficiency    Vitamin D deficiency    Past Surgical History:  Procedure Laterality Date   ANTERIOR CERVICAL DECOMP/DISCECTOMY FUSION N/A 07/30/2022   Procedure: C4-7 ANTERIOR CERVICAL DISCECTOMY AND FUSION (GLOBUS HEDRON);  Surgeon: Meade Maw, MD;  Location: [facility] ORS;  Service: Neurosurgery;  Laterality: N/A;   BILATERAL CARPAL TUNNEL RELEASE     CERVICAL WOUND DEBRIDEMENT N/A 08/01/2022   Procedure: CERVICAL WOUND DEBRIDEMENT OF HEMATOMA;  Surgeon: Meade Maw, MD;  Location: [facility] ORS;  Service: Neurosurgery;  Laterality: N/A;   FRACTURE SURGERY Left 1984   intramedullary rod   hip bone transplant  1984   LEG SURGERY Left    oseotomy   REPAIR ANKLE LIGAMENT Right 1978   REPAIR KNEE LIGAMENT Bilateral    acl   TRIGGER FINGER RELEASE Bilateral    thumbs   Patient Active Problem List   Diagnosis Date Noted   Postoperative hematoma of musculoskeletal structure following musculoskeletal procedure 08/01/2022   Cervical  myelopathy ([place]) 07/30/2022   Cervical spinal stenosis 07/30/2022   Radiculopathy, cervical 07/30/2022   S/P cervical spinal fusion 07/30/2022   Vitamin D deficiency 06/25/2022   B12 deficiency 06/25/2022   Elevated LDL cholesterol level 06/25/2022   Prediabetes 03/27/2021   History of nausea- reported 01/27/2020 02/02/2020   Accidental fall from ladder 02/02/2020   Acute right-sided thoracic back pain 02/02/2020   Neck stiffness 02/02/2020   Concussion with no loss of consciousness 02/02/2020   Multiple falls 02/02/2020   Head trauma, initial encounter 02/02/2020   Acute post-traumatic headache, not intractable 02/02/2020   Essential hypertension 08/26/2018   Pre-bariatric surgery nutrition evaluation 06/19/2018   Migraine with vertigo 01/05/2014   Arthralgia of multiple joints 04/28/2009   Carpal tunnel syndrome 08/30/2008    ONSET DATE: summer 2022  REFERRING DIAG: LE weakness and gait instability   THERAPY DIAG:  Muscle weakness (generalized)  Unsteadiness on feet  Difficulty in walking, not elsewhere classified  Rationale for Evaluation and Treatment: Rehabilitation  SUBJECTIVE:  SUBJECTIVE STATEMENT: Patient had no falls since last session. Has been working using his scooter for longer distances.   Pt accompanied by: significant other  PERTINENT HISTORY:  Patient presents for LE weakness and gait instability. He is s/p ACDF C-7 on 07/30/22. Patient has a leg length discrepancy and limited L knee flexion since his 20's. Patient has a lot of atrophy and lower back pain, foot drop bilaterally.  First symptoms was stumbling up stairs, with LLE. Foot drop was improved after ACDF. Now has numbness and tingling at the end of the day. Does have a few lesions in his pons per wife, on blood  pressure medication now. PMH includes ADHD, arthritis, concussion COVID, HTN, multiple lacunar infarcts, MVP, pernicious anemia, rocky mountain spotted fever, spinal stenosis, vestibular migraine, vitamin B deficiency.   PAIN:  Are you having pain? Yes: NPRS scale: patient does not give number/10 Pain location: low back, L knee Pain description: aching Aggravating factors: prolonged walking Relieving factors: laying down  PRECAUTIONS: Other: cervical; no lifting more than 20 lb   WEIGHT BEARING RESTRICTIONS: No  FALLS: Has patient fallen in last 6 months? Yes. Number of falls multiple a week  LIVING ENVIRONMENT: Lives with: lives with their family Lives in: House/apartment Stairs: Yes: Internal: flight steps; is sleeping downstairs Has following equipment at home: Single point cane  PLOF: Independent, uses a walking stick  PATIENT GOALS: gait control of feet, strengthen legs, balance, atrophy in hand  OBJECTIVE:   DIAGNOSTIC FINDINGS:  09/11/22: No acute fracture. No spondylolisthesis. No compression deformities. Disc space narrowing C3-4 with marginal osteophytes consistent with degenerative disc disease. Postop changes ACDF C4-C7 with anatomic alignment. Normal prevertebral and cervicocranial soft tissues.   06/19/22: 1. L5 chronic pars defects with L5-S1 anterolisthesis and advanced disc degeneration causing marked biforaminal L5 impingement. 2. Degeneration and short pedicles causes compressive spinal stenosis from L1-2 to L3-4, most advanced at L2-3. Moderate bilateral foraminal narrowing at the same levels.   COGNITION: Overall cognitive status: Within functional limits for tasks assessed    COORDINATION: Limited LLE  MUSCLE LENGTH: Significant limitation L hamstring; 60 degree flexion deficit.   POSTURE:  weight shift to the R  LOWER EXTREMITY ROM:     L knee flexion contraction -60 contraction LOWER EXTREMITY MMT:    MMT Right Eval Left Eval  Hip  flexion 4 4  Hip extension    Hip abduction 4- 4-  Hip adduction 4- 4-  Hip internal rotation    Hip external rotation    Knee flexion 4- 3  Knee extension 4 4  Ankle dorsiflexion 3 2+  Ankle plantarflexion 3 3  Ankle inversion    Ankle eversion    (Blank rows = not tested)  BED MOBILITY:  Perform next session  TRANSFERS: Assistive device utilized: Single point cane  Sit to stand: CGA Stand to sit: CGA Chair to chair: CGA   GAIT: Gait pattern: L knee limited extension, L foot drop, L foot eversion Distance walked: 1200 Assistive device utilized: Single point cane Level of assistance: CGA Comments: Patient has increased eversion and rolling of L foot, LLE leg length discrepancy.   FUNCTIONAL TESTS:  5 times sit to stand: 5.6 seconds with minor use of LLE no UE support Timed up and go (TUG): n/a 6 minute walk test: 1200 ft 10 meter walk test: 7 seconds BERG: 36/56   PATIENT SURVEYS:  FOTO 54  TODAY'S TREATMENT:  DATE: 11/14/22   Neuro Re-ed:    Airex pad: dynadisc tandem stance 30 seconds x 3 trials  Tilt board: -anterior/posterior weight shift 20x each side -lateral weight shifts 20x each side   Dynadisc: -single leg stance with UE support 30 seconds x 2 trials    TherEx: Standing with # 5 ankle weight: CGA for stability  -Hip extension with B upper extremity support, cueing for neutral hip alignment, upright posture for optimal muscle recruitment, and sequencing, 10x each LE,  -Hip abduction with B upper extremity support, cueing for neutral foot alignment for correct muscle activation, 10x each LE -Hip flexion with B upper extremity support, cueing for body mechanics, speed of muscle recruitment for optimal strengthening and stabilization 10x each LE    Seated with # 5 ankle weights  -Seated marches with upright posture, back  away from back of chair for abdominal/trunk activation/stabilization, 10x each LE -Seated LAQ with 3 second holds, 10x each LE, cueing for muscle activation and sequencing for neutral alignment -Seated IR/ER with cueing for stabilizing knee placement with lateral foot movement for optimal muscle recruitment, 10x each LE; second set with rainbow ball   LAQ seated with adduction ball squeeze 15x; x 2 sets Seated heel toe raises 10x   PATIENT EDUCATION: Education details: goals, POC Person educated: Patient and Spouse Education method: Explanation, Demonstration, Tactile cues, and Verbal cues Education comprehension: verbalized understanding, returned demonstration, verbal cues required, and tactile cues required  HOME EXERCISE PROGRAM: Access Code: [email or web address] URL: [email or web address] Date: 10/11/2022 Prepared by: Janna Arch  Exercises - Seated Ankle Alphabet  - 1 x daily - 7 x weekly - 2 sets - 10 reps - 5 hold - Seated Heel Toe Raises  - 1 x daily - 7 x weekly - 2 sets - 10 reps - 5 hold - Standing March with Counter Support  - 1 x daily - 7 x weekly - 2 sets - 10 reps - 5 hold  Access Code: [email or web address] URL: [email or web address] Date: 10/30/2022 Prepared by: Janna Arch  Exercises - Seated Ankle Plantarflexion with Resistance  - 1 x daily - 7 x weekly - 2 sets - 10 reps - 5 hold - Seated Ankle Eversion with Resistance  - 1 x daily - 7 x weekly - 2 sets - 10 reps - 5 hold  Access Code: [email or web address] URL: [email or web address] Date: 11/14/2022 Prepared by: Janna Arch  Exercises - Seated Heel Toe Raises  - 1 x daily - 7 x weekly - 2 sets - 10 reps - 5 hold - Seated Hip Internal Rotation AROM  - 1 x daily - 7 x weekly - 2 sets - 10 reps - 5 hold - Bilateral Long Arc Quad  - 1 x daily - 7 x weekly - 2 sets - 10 reps - 5 hold - Foam Balance Tandem stance  - 1 x daily - 7 x weekly - 2 sets - 2 reps - 30 hold - Single Leg Balance on Foam  - 1  x daily - 7 x weekly - 2 sets - 30 reps - 2 hold - Seated Hip Flexion March with Ankle Weights  - 1 x daily - 7 x weekly - 2 sets - 10 reps - 5 hold - Standing Hip Abduction with Ankle Weight  - 1 x daily - 7 x weekly - 2 sets - 10 reps - 5 hold - Standing Hip Extension with Ankle Weight  - 1 x daily - 7 x weekly - 2 sets -  10 reps - 5 hold - Standing Hip Flexion with Ankle Weight  - 1 x daily - 7 x weekly - 2 sets - 10 reps - 5 hold - Seated Long Arc Quad with Ankle Weight  - 1 x daily - 7 x weekly - 2 sets - 10 reps - 5 hold   GOALS: Goals reviewed with patient? Yes  SHORT TERM GOALS: Target date: 11/07/2022    Patient will be independent in home exercise program to improve strength/mobility for better functional independence with ADLs. Baseline: 2/21: HEP give next session Goal status: INITIAL    LONG TERM GOALS: Target date: 01/02/2023    Patient will increase FOTO score to equal to or greater than  64%   to demonstrate statistically significant improvement in mobility and quality of life.  Baseline: 2/21: 54% Goal status: INITIAL  2.  Patient will increase Berg Balance score by > 6 points (42/56)to demonstrate decreased fall risk during functional activities. Baseline: 2/21: 36/56 Goal status: INITIAL  3.  Patient will increase six minute walk test distance to >1500 for progression to age norm community ambulator and improve gait ability Baseline: 2/21: 1200 ft with SPC Goal status: INITIAL  4.  Patient will increase BLE gross strength to 4+/5 as to improve functional strength for independent gait, increased standing tolerance and increased ADL ability. Baseline: 2/21 :see above Goal status: INITIAL   ASSESSMENT:  CLINICAL IMPRESSION: Patient's HEP progressed. Education on performance tolerated well. L ankle strength limited requiring UE support on unstable surface. Patient tolerates use of ankle weights this session for LE strengthening. New pain in back is noted with  sciatic symptoms. Will address next session. Patient will benefit from skilled physical therapy to improve strength, mobility, and stability for improved quality of life.   OBJECTIVE IMPAIRMENTS: Abnormal gait, decreased activity tolerance, decreased balance, decreased coordination, decreased endurance, decreased mobility, difficulty walking, decreased ROM, decreased strength, hypomobility, impaired flexibility, improper body mechanics, and postural dysfunction.   ACTIVITY LIMITATIONS: carrying, lifting, bending, standing, squatting, stairs, transfers, reach over head, locomotion level, and caring for others  PARTICIPATION LIMITATIONS: meal prep, cleaning, laundry, personal finances, driving, shopping, community activity, and yard work  PERSONAL FACTORS: Age, Past/current experiences, Time since onset of injury/illness/exacerbation, and 3+ comorbidities: DHD, arthritis, concussion COVID, HTN, multiple lacunar infarcts, MVP, pernicious anemia, rocky mountain spotted fever, spinal stenosis, vestibular migraine, vitamin B deficiency.   are also affecting patient's functional outcome.   REHAB POTENTIAL: Good  CLINICAL DECISION MAKING: Evolving/moderate complexity  EVALUATION COMPLEXITY: Moderate  PLAN:  PT FREQUENCY: 1x/week  PT DURATION: 12 weeks  PLANNED INTERVENTIONS: Therapeutic exercises, Therapeutic activity, Neuromuscular re-education, Balance training, Gait training, Patient/Family education, Self Care, Joint mobilization, Stair training, Vestibular training, Canalith repositioning, Visual/preceptual remediation/compensation, Orthotic/Fit training, DME instructions, Cognitive remediation, Electrical stimulation, Spinal mobilization, Cryotherapy, Moist heat, Splintting, Taping, Traction, Ultrasound, Manual therapy, and Re-evaluation  PLAN FOR NEXT SESSION:  ankle strengthening, sciatic nerve address    Janna Arch, PT 11/14/2022, 5:07 PM

## 2022-11-14 ENCOUNTER — Ambulatory Visit: Payer: 59

## 2022-11-14 DIAGNOSIS — M6281 Muscle weakness (generalized): Secondary | ICD-10-CM | POA: Diagnosis not present

## 2022-11-14 DIAGNOSIS — R262 Difficulty in walking, not elsewhere classified: Secondary | ICD-10-CM

## 2022-11-14 DIAGNOSIS — R2681 Unsteadiness on feet: Secondary | ICD-10-CM | POA: Diagnosis not present

## 2022-11-20 ENCOUNTER — Ambulatory Visit: Payer: 59 | Attending: Neurosurgery

## 2022-11-20 DIAGNOSIS — R262 Difficulty in walking, not elsewhere classified: Secondary | ICD-10-CM | POA: Insufficient documentation

## 2022-11-20 DIAGNOSIS — R2681 Unsteadiness on feet: Secondary | ICD-10-CM | POA: Insufficient documentation

## 2022-11-20 DIAGNOSIS — M6281 Muscle weakness (generalized): Secondary | ICD-10-CM

## 2022-11-20 NOTE — Therapy (Signed)
OUTPATIENT PHYSICAL THERAPY NEURO TREATMENT   Patient Name: [name] MRN: [id number] DOB:1960-11-11, 62 y.o., male Today's Date: 11/20/2022   PCP: Jon Billings NP REFERRING PROVIDER: Loleta Dicker PA  END OF SESSION:  PT End of Session - 11/20/22 1608     Visit Number 6    Number of Visits 12    Date for PT Re-Evaluation 01/02/23    Authorization Type 6/10 eval 2/21    PT Start Time 1606    PT Stop Time 1645    PT Time Calculation (min) 39 min    Equipment Utilized During Treatment Gait belt    Activity Tolerance Patient tolerated treatment well    Behavior During Therapy WFL for tasks assessed/performed                 Past Medical History:  Diagnosis Date   ADHD    Arthritis    Concussion    COVID 12/2020   Hypertension    Multiple lacunar infarcts    MVP (mitral valve prolapse)    Pernicious anemia    [facility] spotted fever    Spinal stenosis    Vestibular migraine    Vitamin B12 deficiency    Vitamin D deficiency    Past Surgical History:  Procedure Laterality Date   ANTERIOR CERVICAL DECOMP/DISCECTOMY FUSION N/A 07/30/2022   Procedure: C4-7 ANTERIOR CERVICAL DISCECTOMY AND FUSION (GLOBUS HEDRON);  Surgeon: Meade Maw, MD;  Location: [facility] ORS;  Service: Neurosurgery;  Laterality: N/A;   BILATERAL CARPAL TUNNEL RELEASE     CERVICAL WOUND DEBRIDEMENT N/A 08/01/2022   Procedure: CERVICAL WOUND DEBRIDEMENT OF HEMATOMA;  Surgeon: Meade Maw, MD;  Location: [facility] ORS;  Service: Neurosurgery;  Laterality: N/A;   FRACTURE SURGERY Left 1984   intramedullary rod   hip bone transplant  1984   LEG SURGERY Left    oseotomy   REPAIR ANKLE LIGAMENT Right 1978   REPAIR KNEE LIGAMENT Bilateral    acl   TRIGGER FINGER RELEASE Bilateral    thumbs   Patient Active Problem List   Diagnosis Date Noted   Postoperative hematoma of musculoskeletal structure following musculoskeletal procedure 08/01/2022   Cervical myelopathy  07/30/2022   Cervical spinal stenosis 07/30/2022   Radiculopathy, cervical 07/30/2022   S/P cervical spinal fusion 07/30/2022   Vitamin D deficiency 06/25/2022   B12 deficiency 06/25/2022   Elevated LDL cholesterol level 06/25/2022   Prediabetes 03/27/2021   History of nausea- reported 01/27/2020 02/02/2020   Accidental fall from ladder 02/02/2020   Acute right-sided thoracic back pain 02/02/2020   Neck stiffness 02/02/2020   Concussion with no loss of consciousness 02/02/2020   Multiple falls 02/02/2020   Head trauma, initial encounter 02/02/2020   Acute post-traumatic headache, not intractable 02/02/2020   Essential hypertension 08/26/2018   Pre-bariatric surgery nutrition evaluation 06/19/2018   Migraine with vertigo 01/05/2014   Arthralgia of multiple joints 04/28/2009   Carpal tunnel syndrome 08/30/2008    ONSET DATE: summer 2022  REFERRING DIAG: LE weakness and gait instability   THERAPY DIAG:  Muscle weakness (generalized)  Unsteadiness on feet  Difficulty in walking, not elsewhere classified  Rationale for Evaluation and Treatment: Rehabilitation  SUBJECTIVE:  SUBJECTIVE STATEMENT: Patient reports he isn't using his cane as much.   Pt accompanied by: significant other  PERTINENT HISTORY:  Patient presents for LE weakness and gait instability. He is s/p ACDF C-7 on 07/30/22. Patient has a leg length discrepancy and limited L knee flexion since his 20's. Patient has a lot of atrophy and lower back pain, foot drop bilaterally.  First symptoms was stumbling up stairs, with LLE. Foot drop was improved after ACDF. Now has numbness and tingling at the end of the day. Does have a few lesions in his pons per wife, on blood pressure medication now. PMH includes ADHD, arthritis, concussion  COVID, HTN, multiple lacunar infarcts, MVP, pernicious anemia, rocky mountain spotted fever, spinal stenosis, vestibular migraine, vitamin B deficiency.   PAIN:  Are you having pain? Yes: NPRS scale: patient does not give number/10 Pain location: low back, L knee Pain description: aching Aggravating factors: prolonged walking Relieving factors: laying down  PRECAUTIONS: Other: cervical; no lifting more than 20 lb   WEIGHT BEARING RESTRICTIONS: No  FALLS: Has patient fallen in last 6 months? Yes. Number of falls multiple a week  LIVING ENVIRONMENT: Lives with: lives with their family Lives in: House/apartment Stairs: Yes: Internal: flight steps; is sleeping downstairs Has following equipment at home: Single point cane  PLOF: Independent, uses a walking stick  PATIENT GOALS: gait control of feet, strengthen legs, balance, atrophy in hand  OBJECTIVE:   DIAGNOSTIC FINDINGS:  09/11/22: No acute fracture. No spondylolisthesis. No compression deformities. Disc space narrowing C3-4 with marginal osteophytes consistent with degenerative disc disease. Postop changes ACDF C4-C7 with anatomic alignment. Normal prevertebral and cervicocranial soft tissues.   06/19/22: 1. L5 chronic pars defects with L5-S1 anterolisthesis and advanced disc degeneration causing marked biforaminal L5 impingement. 2. Degeneration and short pedicles causes compressive spinal stenosis from L1-2 to L3-4, most advanced at L2-3. Moderate bilateral foraminal narrowing at the same levels.   COGNITION: Overall cognitive status: Within functional limits for tasks assessed    COORDINATION: Limited LLE  MUSCLE LENGTH: Significant limitation L hamstring; 60 degree flexion deficit.   POSTURE:  weight shift to the R  LOWER EXTREMITY ROM:     L knee flexion contraction -60 contraction LOWER EXTREMITY MMT:    MMT Right Eval Left Eval  Hip flexion 4 4  Hip extension    Hip abduction 4- 4-  Hip adduction  4- 4-  Hip internal rotation    Hip external rotation    Knee flexion 4- 3  Knee extension 4 4  Ankle dorsiflexion 3 2+  Ankle plantarflexion 3 3  Ankle inversion    Ankle eversion    (Blank rows = not tested)  BED MOBILITY:  Perform next session  TRANSFERS: Assistive device utilized: Single point cane  Sit to stand: CGA Stand to sit: CGA Chair to chair: CGA   GAIT: Gait pattern: L knee limited extension, L foot drop, L foot eversion Distance walked: 1200 Assistive device utilized: Single point cane Level of assistance: CGA Comments: Patient has increased eversion and rolling of L foot, LLE leg length discrepancy.   FUNCTIONAL TESTS:  5 times sit to stand: 5.6 seconds with minor use of LLE no UE support Timed up and go (TUG): n/a 6 minute walk test: 1200 ft 10 meter walk test: 7 seconds BERG: 36/56   PATIENT SURVEYS:  FOTO 54  TODAY'S TREATMENT:  DATE: 11/20/22   Neuro Re-ed:   ambulate across stable and unstable surface outside. Negotiating changing surfaces from grass to sidewalk, across brick with turns and obstacles in pathway without LOB.  Airex pad: sort letters into alphabetical order x 4 minutes   Activity Description: three on floor three on table; red=right hand/foot green =left hand/foot Activity Setting:  The Blaze Pod Random setting was chosen to enhance cognitive processing and agility, providing an unpredictable environment to simulate real-world scenarios, and fostering quick reactions and adaptability.   Number of Pods:  6 Cycles/Sets:  2 Duration (Time or Hit Count):  20     TherEx: Dynadisc:  -df /pf 15x each LE -inversion/eversion 15x each LE -clockwise circle 10x each LE, counterclockwise 10x each LE   PATIENT EDUCATION: Education details: goals, POC Person educated: Patient and Spouse Education method:  Explanation, Demonstration, Tactile cues, and Verbal cues Education comprehension: verbalized understanding, returned demonstration, verbal cues required, and tactile cues required  HOME EXERCISE PROGRAM: Access Code: [email or web address] URL: [email or web address] Date: 10/11/2022 Prepared by: Janna Arch  Exercises - Seated Ankle Alphabet  - 1 x daily - 7 x weekly - 2 sets - 10 reps - 5 hold - Seated Heel Toe Raises  - 1 x daily - 7 x weekly - 2 sets - 10 reps - 5 hold - Standing March with Counter Support  - 1 x daily - 7 x weekly - 2 sets - 10 reps - 5 hold  Access Code: [email or web address] URL: [email or web address] Date: 10/30/2022 Prepared by: Janna Arch  Exercises - Seated Ankle Plantarflexion with Resistance  - 1 x daily - 7 x weekly - 2 sets - 10 reps - 5 hold - Seated Ankle Eversion with Resistance  - 1 x daily - 7 x weekly - 2 sets - 10 reps - 5 hold  Access Code: [email or web address] URL: [email or web address] Date: 11/14/2022 Prepared by: Janna Arch  Exercises - Seated Heel Toe Raises  - 1 x daily - 7 x weekly - 2 sets - 10 reps - 5 hold - Seated Hip Internal Rotation AROM  - 1 x daily - 7 x weekly - 2 sets - 10 reps - 5 hold - Bilateral Long Arc Quad  - 1 x daily - 7 x weekly - 2 sets - 10 reps - 5 hold - Foam Balance Tandem stance  - 1 x daily - 7 x weekly - 2 sets - 2 reps - 30 hold - Single Leg Balance on Foam  - 1 x daily - 7 x weekly - 2 sets - 30 reps - 2 hold - Seated Hip Flexion March with Ankle Weights  - 1 x daily - 7 x weekly - 2 sets - 10 reps - 5 hold - Standing Hip Abduction with Ankle Weight  - 1 x daily - 7 x weekly - 2 sets - 10 reps - 5 hold - Standing Hip Extension with Ankle Weight  - 1 x daily - 7 x weekly - 2 sets - 10 reps - 5 hold - Standing Hip Flexion with Ankle Weight  - 1 x daily - 7 x weekly - 2 sets - 10 reps - 5 hold - Seated Long Arc Quad with Ankle Weight  - 1 x daily - 7 x weekly - 2 sets - 10 reps - 5  hold   GOALS: Goals reviewed with patient? Yes  SHORT TERM GOALS: Target date: 11/07/2022    Patient will be independent in home exercise program to  improve strength/mobility for better functional independence with ADLs. Baseline: 2/21: HEP give next session Goal status: INITIAL    LONG TERM GOALS: Target date: 01/02/2023    Patient will increase FOTO score to equal to or greater than  64%   to demonstrate statistically significant improvement in mobility and quality of life.  Baseline: 2/21: 54% Goal status: INITIAL  2.  Patient will increase Berg Balance score by > 6 points (42/56)to demonstrate decreased fall risk during functional activities. Baseline: 2/21: 36/56 Goal status: INITIAL  3.  Patient will increase six minute walk test distance to >1500 for progression to age norm community ambulator and improve gait ability Baseline: 2/21: 1200 ft with SPC Goal status: INITIAL  4.  Patient will increase BLE gross strength to 4+/5 as to improve functional strength for independent gait, increased standing tolerance and increased ADL ability. Baseline: 2/21 :see above Goal status: INITIAL   ASSESSMENT:  CLINICAL IMPRESSION: The patient demonstrated significant progress while utilizing Blaze Pods, showcasing improved coordination, balance, and cognitive function. The incorporation of dual-tasking technology with color recognition and association with specific movements in Blaze Pods was strategically chosen to provide a dynamic training environment, enabling the patient to engage in simultaneous physical and cognitive tasks. This unique approach enhances not only their physical abilities but also fosters increased neural connectivity and mental awareness, contributing to a well-rounded and effective rehabilitation and training experience. Patient presents with excellent motivation throughout physical therapy session. He is challenged with ankle stability interventions and will  benefit from ankle strengthening.  . Patient will benefit from skilled physical therapy to improve strength, mobility, and stability for improved quality of life.   OBJECTIVE IMPAIRMENTS: Abnormal gait, decreased activity tolerance, decreased balance, decreased coordination, decreased endurance, decreased mobility, difficulty walking, decreased ROM, decreased strength, hypomobility, impaired flexibility, improper body mechanics, and postural dysfunction.   ACTIVITY LIMITATIONS: carrying, lifting, bending, standing, squatting, stairs, transfers, reach over head, locomotion level, and caring for others  PARTICIPATION LIMITATIONS: meal prep, cleaning, laundry, personal finances, driving, shopping, community activity, and yard work  PERSONAL FACTORS: Age, Past/current experiences, Time since onset of injury/illness/exacerbation, and 3+ comorbidities: DHD, arthritis, concussion COVID, HTN, multiple lacunar infarcts, MVP, pernicious anemia, rocky mountain spotted fever, spinal stenosis, vestibular migraine, vitamin B deficiency.   are also affecting patient's functional outcome.   REHAB POTENTIAL: Good  CLINICAL DECISION MAKING: Evolving/moderate complexity  EVALUATION COMPLEXITY: Moderate  PLAN:  PT FREQUENCY: 1x/week  PT DURATION: 12 weeks  PLANNED INTERVENTIONS: Therapeutic exercises, Therapeutic activity, Neuromuscular re-education, Balance training, Gait training, Patient/Family education, Self Care, Joint mobilization, Stair training, Vestibular training, Canalith repositioning, Visual/preceptual remediation/compensation, Orthotic/Fit training, DME instructions, Cognitive remediation, Electrical stimulation, Spinal mobilization, Cryotherapy, Moist heat, Splintting, Taping, Traction, Ultrasound, Manual therapy, and Re-evaluation  PLAN FOR NEXT SESSION:  ankle strengthening, sciatic nerve address    Janna Arch, PT 11/20/2022, 4:48 PM

## 2022-11-27 NOTE — Therapy (Signed)
OUTPATIENT PHYSICAL THERAPY NEURO TREATMENT   Patient Name: [name] MRN: [id number] DOB:March 17, 1961, 62 y.o., male Today's Date: 11/27/2022   PCP: Larae Grooms NP REFERRING PROVIDER: Susanne Borders PA  END OF SESSION:        Past Medical History:  Diagnosis Date   ADHD    Arthritis    Concussion    COVID 12/2020   Hypertension    Multiple lacunar infarcts    MVP (mitral valve prolapse)    Pernicious anemia    [facility] spotted fever    Spinal stenosis    Vestibular migraine    Vitamin B12 deficiency    Vitamin D deficiency    Past Surgical History:  Procedure Laterality Date   ANTERIOR CERVICAL DECOMP/DISCECTOMY FUSION N/A 07/30/2022   Procedure: C4-7 ANTERIOR CERVICAL DISCECTOMY AND FUSION (GLOBUS HEDRON);  Surgeon: Venetia Night, MD;  Location: [facility] ORS;  Service: Neurosurgery;  Laterality: N/A;   BILATERAL CARPAL TUNNEL RELEASE     CERVICAL WOUND DEBRIDEMENT N/A 08/01/2022   Procedure: CERVICAL WOUND DEBRIDEMENT OF HEMATOMA;  Surgeon: Venetia Night, MD;  Location: [facility] ORS;  Service: Neurosurgery;  Laterality: N/A;   FRACTURE SURGERY Left 1984   intramedullary rod   hip bone transplant  1984   LEG SURGERY Left    oseotomy   REPAIR ANKLE LIGAMENT Right 1978   REPAIR KNEE LIGAMENT Bilateral    acl   TRIGGER FINGER RELEASE Bilateral    thumbs   Patient Active Problem List   Diagnosis Date Noted   Postoperative hematoma of musculoskeletal structure following musculoskeletal procedure 08/01/2022   Cervical myelopathy 07/30/2022   Cervical spinal stenosis 07/30/2022   Radiculopathy, cervical 07/30/2022   S/P cervical spinal fusion 07/30/2022   Vitamin D deficiency 06/25/2022   B12 deficiency 06/25/2022   Elevated LDL cholesterol level 06/25/2022   Prediabetes 03/27/2021   History of nausea- reported 01/27/2020 02/02/2020   Accidental fall from ladder 02/02/2020   Acute right-sided thoracic back pain 02/02/2020   Neck  stiffness 02/02/2020   Concussion with no loss of consciousness 02/02/2020   Multiple falls 02/02/2020   Head trauma, initial encounter 02/02/2020   Acute post-traumatic headache, not intractable 02/02/2020   Essential hypertension 08/26/2018   Pre-bariatric surgery nutrition evaluation 06/19/2018   Migraine with vertigo 01/05/2014   Arthralgia of multiple joints 04/28/2009   Carpal tunnel syndrome 08/30/2008    ONSET DATE: summer 2022  REFERRING DIAG: LE weakness and gait instability   THERAPY DIAG:  No diagnosis found.  Rationale for Evaluation and Treatment: Rehabilitation  SUBJECTIVE:  SUBJECTIVE STATEMENT: ***  Pt accompanied by: significant other  PERTINENT HISTORY:  Patient presents for LE weakness and gait instability. He is s/p ACDF C-7 on 07/30/22. Patient has a leg length discrepancy and limited L knee flexion since his 20's. Patient has a lot of atrophy and lower back pain, foot drop bilaterally.  First symptoms was stumbling up stairs, with LLE. Foot drop was improved after ACDF. Now has numbness and tingling at the end of the day. Does have a few lesions in his pons per wife, on blood pressure medication now. PMH includes ADHD, arthritis, concussion COVID, HTN, multiple lacunar infarcts, MVP, pernicious anemia, rocky mountain spotted fever, spinal stenosis, vestibular migraine, vitamin B deficiency.   PAIN:  Are you having pain? Yes: NPRS scale: patient does not give number/10 Pain location: low back, L knee Pain description: aching Aggravating factors: prolonged walking Relieving factors: laying down  PRECAUTIONS: Other: cervical; no lifting more than 20 lb   WEIGHT BEARING RESTRICTIONS: No  FALLS: Has patient fallen in last 6 months? Yes. Number of falls multiple a  week  LIVING ENVIRONMENT: Lives with: lives with their family Lives in: House/apartment Stairs: Yes: Internal: flight steps; is sleeping downstairs Has following equipment at home: Single point cane  PLOF: Independent, uses a walking stick  PATIENT GOALS: gait control of feet, strengthen legs, balance, atrophy in hand  OBJECTIVE:   DIAGNOSTIC FINDINGS:  09/11/22: No acute fracture. No spondylolisthesis. No compression deformities. Disc space narrowing C3-4 with marginal osteophytes consistent with degenerative disc disease. Postop changes ACDF C4-C7 with anatomic alignment. Normal prevertebral and cervicocranial soft tissues.   06/19/22: 1. L5 chronic pars defects with L5-S1 anterolisthesis and advanced disc degeneration causing marked biforaminal L5 impingement. 2. Degeneration and short pedicles causes compressive spinal stenosis from L1-2 to L3-4, most advanced at L2-3. Moderate bilateral foraminal narrowing at the same levels.   COGNITION: Overall cognitive status: Within functional limits for tasks assessed    COORDINATION: Limited LLE  MUSCLE LENGTH: Significant limitation L hamstring; 60 degree flexion deficit.   POSTURE:  weight shift to the R  LOWER EXTREMITY ROM:     L knee flexion contraction -60 contraction LOWER EXTREMITY MMT:    MMT Right Eval Left Eval  Hip flexion 4 4  Hip extension    Hip abduction 4- 4-  Hip adduction 4- 4-  Hip internal rotation    Hip external rotation    Knee flexion 4- 3  Knee extension 4 4  Ankle dorsiflexion 3 2+  Ankle plantarflexion 3 3  Ankle inversion    Ankle eversion    (Blank rows = not tested)  BED MOBILITY:  Perform next session  TRANSFERS: Assistive device utilized: Single point cane  Sit to stand: CGA Stand to sit: CGA Chair to chair: CGA   GAIT: Gait pattern: L knee limited extension, L foot drop, L foot eversion Distance walked: 1200 Assistive device utilized: Single point cane Level of  assistance: CGA Comments: Patient has increased eversion and rolling of L foot, LLE leg length discrepancy.   FUNCTIONAL TESTS:  5 times sit to stand: 5.6 seconds with minor use of LLE no UE support Timed up and go (TUG): n/a 6 minute walk test: 1200 ft 10 meter walk test: 7 seconds BERG: 36/56   PATIENT SURVEYS:  FOTO 54  TODAY'S TREATMENT:  DATE: 11/27/22   Neuro Re-ed:    Airex pad: sort letters into alphabetical order x 4 minutes   Activity Description: three on floor three on table; red=right hand/foot green =left hand/foot Activity Setting:  The [employer] Pod Random setting was chosen to enhance cognitive processing and agility, providing an unpredictable environment to simulate real-world scenarios, and fostering quick reactions and adaptability.   Number of Pods:  6 Cycles/Sets:  2 Duration (Time or Hit Count):  20     TherEx: Dynadisc:  -df /pf 15x each LE -inversion/eversion 15x each LE -clockwise circle 10x each LE, counterclockwise 10x each LE   PATIENT EDUCATION: Education details: goals, POC Person educated: Patient and Spouse Education method: Explanation, Demonstration, Tactile cues, and Verbal cues Education comprehension: verbalized understanding, returned demonstration, verbal cues required, and tactile cues required  HOME EXERCISE PROGRAM: Access Code: [email or web address] URL: [email or web address] Date: 10/11/2022 Prepared by: Precious Bard  Exercises - Seated Ankle Alphabet  - 1 x daily - 7 x weekly - 2 sets - 10 reps - 5 hold - Seated Heel Toe Raises  - 1 x daily - 7 x weekly - 2 sets - 10 reps - 5 hold - Standing March with Counter Support  - 1 x daily - 7 x weekly - 2 sets - 10 reps - 5 hold  Access Code: [email or web address] URL: [email or web address] Date: 10/30/2022 Prepared by: Precious Bard  Exercises -  Seated Ankle Plantarflexion with Resistance  - 1 x daily - 7 x weekly - 2 sets - 10 reps - 5 hold - Seated Ankle Eversion with Resistance  - 1 x daily - 7 x weekly - 2 sets - 10 reps - 5 hold  Access Code: [email or web address] URL: [email or web address] Date: 11/14/2022 Prepared by: Precious Bard  Exercises - Seated Heel Toe Raises  - 1 x daily - 7 x weekly - 2 sets - 10 reps - 5 hold - Seated Hip Internal Rotation AROM  - 1 x daily - 7 x weekly - 2 sets - 10 reps - 5 hold - Bilateral Long Arc Quad  - 1 x daily - 7 x weekly - 2 sets - 10 reps - 5 hold - Foam Balance Tandem stance  - 1 x daily - 7 x weekly - 2 sets - 2 reps - 30 hold - Single Leg Balance on Foam  - 1 x daily - 7 x weekly - 2 sets - 30 reps - 2 hold - Seated Hip Flexion March with Ankle Weights  - 1 x daily - 7 x weekly - 2 sets - 10 reps - 5 hold - Standing Hip Abduction with Ankle Weight  - 1 x daily - 7 x weekly - 2 sets - 10 reps - 5 hold - Standing Hip Extension with Ankle Weight  - 1 x daily - 7 x weekly - 2 sets - 10 reps - 5 hold - Standing Hip Flexion with Ankle Weight  - 1 x daily - 7 x weekly - 2 sets - 10 reps - 5 hold - Seated Long Arc Quad with Ankle Weight  - 1 x daily - 7 x weekly - 2 sets - 10 reps - 5 hold   GOALS: Goals reviewed with patient? Yes  SHORT TERM GOALS: Target date: 11/07/2022    Patient will be independent in home exercise program to improve strength/mobility for better functional independence with ADLs. Baseline: 2/21: HEP give next session Goal status: INITIAL    LONG TERM GOALS: Target date:  01/02/2023    Patient will increase FOTO score to equal to or greater than  64%   to demonstrate statistically significant improvement in mobility and quality of life.  Baseline: 2/21: 54% Goal status: INITIAL  2.  Patient will increase Berg Balance score by > 6 points (42/56)to demonstrate decreased fall risk during functional activities. Baseline: 2/21: 36/56 Goal status: INITIAL  3.   Patient will increase six minute walk test distance to >1500 for progression to age norm community ambulator and improve gait ability Baseline: 2/21: 1200 ft with SPC Goal status: INITIAL  4.  Patient will increase BLE gross strength to 4+/5 as to improve functional strength for independent gait, increased standing tolerance and increased ADL ability. Baseline: 2/21 :see above Goal status: INITIAL   ASSESSMENT:  CLINICAL IMPRESSION: *** . Patient will benefit from skilled physical therapy to improve strength, mobility, and stability for improved quality of life.   OBJECTIVE IMPAIRMENTS: Abnormal gait, decreased activity tolerance, decreased balance, decreased coordination, decreased endurance, decreased mobility, difficulty walking, decreased ROM, decreased strength, hypomobility, impaired flexibility, improper body mechanics, and postural dysfunction.   ACTIVITY LIMITATIONS: carrying, lifting, bending, standing, squatting, stairs, transfers, reach over head, locomotion level, and caring for others  PARTICIPATION LIMITATIONS: meal prep, cleaning, laundry, personal finances, driving, shopping, community activity, and yard work  PERSONAL FACTORS: Age, Past/current experiences, Time since onset of injury/illness/exacerbation, and 3+ comorbidities: DHD, arthritis, concussion COVID, HTN, multiple lacunar infarcts, MVP, pernicious anemia, rocky mountain spotted fever, spinal stenosis, vestibular migraine, vitamin B deficiency.   are also affecting patient's functional outcome.   REHAB POTENTIAL: Good  CLINICAL DECISION MAKING: Evolving/moderate complexity  EVALUATION COMPLEXITY: Moderate  PLAN:  PT FREQUENCY: 1x/week  PT DURATION: 12 weeks  PLANNED INTERVENTIONS: Therapeutic exercises, Therapeutic activity, Neuromuscular re-education, Balance training, Gait training, Patient/Family education, Self Care, Joint mobilization, Stair training, Vestibular training, Canalith repositioning,  Visual/preceptual remediation/compensation, Orthotic/Fit training, DME instructions, Cognitive remediation, Electrical stimulation, Spinal mobilization, Cryotherapy, Moist heat, Splintting, Taping, Traction, Ultrasound, Manual therapy, and Re-evaluation  PLAN FOR NEXT SESSION:  ankle strengthening, sciatic nerve address    Precious BardMarina Reg Bircher, PT 11/27/2022, 4:56 PM

## 2022-11-28 ENCOUNTER — Ambulatory Visit: Payer: 59

## 2022-11-28 DIAGNOSIS — R2681 Unsteadiness on feet: Secondary | ICD-10-CM | POA: Diagnosis not present

## 2022-11-28 DIAGNOSIS — M6281 Muscle weakness (generalized): Secondary | ICD-10-CM | POA: Diagnosis not present

## 2022-11-28 DIAGNOSIS — R262 Difficulty in walking, not elsewhere classified: Secondary | ICD-10-CM

## 2022-12-03 ENCOUNTER — Ambulatory Visit: Payer: 59

## 2022-12-05 ENCOUNTER — Ambulatory Visit: Payer: 59

## 2022-12-05 DIAGNOSIS — M6281 Muscle weakness (generalized): Secondary | ICD-10-CM

## 2022-12-05 DIAGNOSIS — R262 Difficulty in walking, not elsewhere classified: Secondary | ICD-10-CM | POA: Diagnosis not present

## 2022-12-05 DIAGNOSIS — R2681 Unsteadiness on feet: Secondary | ICD-10-CM | POA: Diagnosis not present

## 2022-12-05 NOTE — Therapy (Signed)
OUTPATIENT PHYSICAL THERAPY NEURO TREATMENT   Patient Name: [name] MRN: [id number] DOB:26-Jan-1961, 62 y.o., male Today's Date: 12/05/2022   PCP: Larae Grooms NP REFERRING PROVIDER: Susanne Borders PA  END OF SESSION:  PT End of Session - 12/05/22 1456     Visit Number 8    Number of Visits 12    Date for PT Re-Evaluation 01/02/23    Authorization Type 7/10 eval 2/21    PT Start Time 1515    PT Stop Time 1558    PT Time Calculation (min) 43 min    Equipment Utilized During Treatment Gait belt    Activity Tolerance Patient tolerated treatment well    Behavior During Therapy WFL for tasks assessed/performed                   Past Medical History:  Diagnosis Date   ADHD    Arthritis    Concussion    COVID 12/2020   Hypertension    Multiple lacunar infarcts    MVP (mitral valve prolapse)    Pernicious anemia    [facility] spotted fever    Spinal stenosis    Vestibular migraine    Vitamin B12 deficiency    Vitamin D deficiency    Past Surgical History:  Procedure Laterality Date   ANTERIOR CERVICAL DECOMP/DISCECTOMY FUSION N/A 07/30/2022   Procedure: C4-7 ANTERIOR CERVICAL DISCECTOMY AND FUSION (GLOBUS HEDRON);  Surgeon: Venetia Night, MD;  Location: [facility] ORS;  Service: Neurosurgery;  Laterality: N/A;   BILATERAL CARPAL TUNNEL RELEASE     CERVICAL WOUND DEBRIDEMENT N/A 08/01/2022   Procedure: CERVICAL WOUND DEBRIDEMENT OF HEMATOMA;  Surgeon: Venetia Night, MD;  Location: [facility] ORS;  Service: Neurosurgery;  Laterality: N/A;   FRACTURE SURGERY Left 1984   intramedullary rod   hip bone transplant  1984   LEG SURGERY Left    oseotomy   REPAIR ANKLE LIGAMENT Right 1978   REPAIR KNEE LIGAMENT Bilateral    acl   TRIGGER FINGER RELEASE Bilateral    thumbs   Patient Active Problem List   Diagnosis Date Noted   Postoperative hematoma of musculoskeletal structure following musculoskeletal procedure 08/01/2022   Cervical  myelopathy 07/30/2022   Cervical spinal stenosis 07/30/2022   Radiculopathy, cervical 07/30/2022   S/P cervical spinal fusion 07/30/2022   Vitamin D deficiency 06/25/2022   B12 deficiency 06/25/2022   Elevated LDL cholesterol level 06/25/2022   Prediabetes 03/27/2021   History of nausea- reported 01/27/2020 02/02/2020   Accidental fall from ladder 02/02/2020   Acute right-sided thoracic back pain 02/02/2020   Neck stiffness 02/02/2020   Concussion with no loss of consciousness 02/02/2020   Multiple falls 02/02/2020   Head trauma, initial encounter 02/02/2020   Acute post-traumatic headache, not intractable 02/02/2020   Essential hypertension 08/26/2018   Pre-bariatric surgery nutrition evaluation 06/19/2018   Migraine with vertigo 01/05/2014   Arthralgia of multiple joints 04/28/2009   Carpal tunnel syndrome 08/30/2008    ONSET DATE: summer 2022  REFERRING DIAG: LE weakness and gait instability   THERAPY DIAG:  Muscle weakness (generalized)  Unsteadiness on feet  Difficulty in walking, not elsewhere classified  Rationale for Evaluation and Treatment: Rehabilitation  SUBJECTIVE:  SUBJECTIVE STATEMENT: Reports L knee pain after being up on his feet for long period of time at school. Denies falls since last session.   Pt accompanied by: significant other  PERTINENT HISTORY:  Patient presents for LE weakness and gait instability. He is s/p ACDF C-7 on 07/30/22. Patient has a leg length discrepancy and limited L knee flexion since his 20's. Patient has a lot of atrophy and lower back pain, foot drop bilaterally.  First symptoms was stumbling up stairs, with LLE. Foot drop was improved after ACDF. Now has numbness and tingling at the end of the day. Does have a few lesions in his pons per wife, on  blood pressure medication now. PMH includes ADHD, arthritis, concussion COVID, HTN, multiple lacunar infarcts, MVP, pernicious anemia, rocky mountain spotted fever, spinal stenosis, vestibular migraine, vitamin B deficiency.   PAIN:  Are you having pain? Yes: NPRS scale: patient does not give number/10 Pain location: low back, L knee Pain description: aching Aggravating factors: prolonged walking Relieving factors: laying down  PRECAUTIONS: Other: cervical; no lifting more than 20 lb   WEIGHT BEARING RESTRICTIONS: No  FALLS: Has patient fallen in last 6 months? Yes. Number of falls multiple a week  LIVING ENVIRONMENT: Lives with: lives with their family Lives in: House/apartment Stairs: Yes: Internal: flight steps; is sleeping downstairs Has following equipment at home: Single point cane  PLOF: Independent, uses a walking stick  PATIENT GOALS: gait control of feet, strengthen legs, balance, atrophy in hand  OBJECTIVE:   DIAGNOSTIC FINDINGS:  09/11/22: No acute fracture. No spondylolisthesis. No compression deformities. Disc space narrowing C3-4 with marginal osteophytes consistent with degenerative disc disease. Postop changes ACDF C4-C7 with anatomic alignment. Normal prevertebral and cervicocranial soft tissues.   06/19/22: 1. L5 chronic pars defects with L5-S1 anterolisthesis and advanced disc degeneration causing marked biforaminal L5 impingement. 2. Degeneration and short pedicles causes compressive spinal stenosis from L1-2 to L3-4, most advanced at L2-3. Moderate bilateral foraminal narrowing at the same levels.   COGNITION: Overall cognitive status: Within functional limits for tasks assessed    COORDINATION: Limited LLE  MUSCLE LENGTH: Significant limitation L hamstring; 60 degree flexion deficit.   POSTURE:  weight shift to the R  LOWER EXTREMITY ROM:     L knee flexion contraction -60 contraction LOWER EXTREMITY MMT:    MMT Right Eval Left Eval   Hip flexion 4 4  Hip extension    Hip abduction 4- 4-  Hip adduction 4- 4-  Hip internal rotation    Hip external rotation    Knee flexion 4- 3  Knee extension 4 4  Ankle dorsiflexion 3 2+  Ankle plantarflexion 3 3  Ankle inversion    Ankle eversion    (Blank rows = not tested)  BED MOBILITY:  Perform next session  TRANSFERS: Assistive device utilized: Single point cane  Sit to stand: CGA Stand to sit: CGA Chair to chair: CGA   GAIT: Gait pattern: L knee limited extension, L foot drop, L foot eversion Distance walked: 1200 Assistive device utilized: Single point cane Level of assistance: CGA Comments: Patient has increased eversion and rolling of L foot, LLE leg length discrepancy.   FUNCTIONAL TESTS:  5 times sit to stand: 5.6 seconds with minor use of LLE no UE support Timed up and go (TUG): n/a 6 minute walk test: 1200 ft 10 meter walk test: 7 seconds BERG: 36/56   PATIENT SURVEYS:  FOTO 54  TODAY'S TREATMENT:  DATE: 12/05/22   Neuro Re-ed:  hallway: -rainbow ball tossing ball to PT with CGA 160 ft -rainbow ball lateral toss to PT with CGA 160 ft  -rainbow ball pass posterolateral to PT with CGA 160 ft  Static standing on half bolster with fingertips on bar 2 x 30 seconds  Static standing with one foot on airex and one foot on treadmill base with no UE support 2 x 30 seconds leading with each LE  Static standing on airex with eyes closed x 30 seconds   TherEx: Heel toe rocker on half bolster x 10 Sit to stand x 5 on airex with no UE support   Seated: YTB planterflexion (gas pedal) x 15 each LE Ankle eversion x 15 each LE  Marching with YTB around feet x 10 each LE  Heel toe rocker on half bolster x 15 each LE  Ball squeeze x 12 with 3 second hold    Supine: Knee flexion/extension contract relax against PT shoulder 10x  LLE Increasing knee flexion 10x hold 10 seconds    PATIENT EDUCATION: Education details: goals, POC Person educated: Patient and Spouse Education method: Explanation, Demonstration, Tactile cues, and Verbal cues Education comprehension: verbalized understanding, returned demonstration, verbal cues required, and tactile cues required  HOME EXERCISE PROGRAM: Access Code: [email or web address] URL: [email or web address] Date: 10/11/2022 Prepared by: Precious Bard  Exercises - Seated Ankle Alphabet  - 1 x daily - 7 x weekly - 2 sets - 10 reps - 5 hold - Seated Heel Toe Raises  - 1 x daily - 7 x weekly - 2 sets - 10 reps - 5 hold - Standing March with Counter Support  - 1 x daily - 7 x weekly - 2 sets - 10 reps - 5 hold  Access Code: [email or web address] URL: [email or web address] Date: 10/30/2022 Prepared by: Precious Bard  Exercises - Seated Ankle Plantarflexion with Resistance  - 1 x daily - 7 x weekly - 2 sets - 10 reps - 5 hold - Seated Ankle Eversion with Resistance  - 1 x daily - 7 x weekly - 2 sets - 10 reps - 5 hold  Access Code: [email or web address] URL: [email or web address] Date: 11/14/2022 Prepared by: Precious Bard  Exercises - Seated Heel Toe Raises  - 1 x daily - 7 x weekly - 2 sets - 10 reps - 5 hold - Seated Hip Internal Rotation AROM  - 1 x daily - 7 x weekly - 2 sets - 10 reps - 5 hold - Bilateral Long Arc Quad  - 1 x daily - 7 x weekly - 2 sets - 10 reps - 5 hold - Foam Balance Tandem stance  - 1 x daily - 7 x weekly - 2 sets - 2 reps - 30 hold - Single Leg Balance on Foam  - 1 x daily - 7 x weekly - 2 sets - 30 reps - 2 hold - Seated Hip Flexion March with Ankle Weights  - 1 x daily - 7 x weekly - 2 sets - 10 reps - 5 hold - Standing Hip Abduction with Ankle Weight  - 1 x daily - 7 x weekly - 2 sets - 10 reps - 5 hold - Standing Hip Extension with Ankle Weight  - 1 x daily - 7 x weekly - 2 sets - 10 reps - 5 hold - Standing Hip Flexion with Ankle Weight   - 1 x daily - 7 x weekly - 2 sets - 10 reps - 5 hold - Seated Long  Arc Quad with Ankle Weight  - 1 x daily - 7 x weekly - 2 sets - 10 reps - 5 hold   GOALS: Goals reviewed with patient? Yes  SHORT TERM GOALS: Target date: 11/07/2022    Patient will be independent in home exercise program to improve strength/mobility for better functional independence with ADLs. Baseline: 2/21: HEP give next session Goal status: INITIAL    LONG TERM GOALS: Target date: 01/02/2023    Patient will increase FOTO score to equal to or greater than  64%   to demonstrate statistically significant improvement in mobility and quality of life.  Baseline: 2/21: 54% Goal status: INITIAL  2.  Patient will increase Berg Balance score by > 6 points (42/56)to demonstrate decreased fall risk during functional activities. Baseline: 2/21: 36/56 Goal status: INITIAL  3.  Patient will increase six minute walk test distance to >1500 for progression to age norm community ambulator and improve gait ability Baseline: 2/21: 1200 ft with SPC Goal status: INITIAL  4.  Patient will increase BLE gross strength to 4+/5 as to improve functional strength for independent gait, increased standing tolerance and increased ADL ability. Baseline: 2/21 :see above Goal status: INITIAL   ASSESSMENT:  CLINICAL IMPRESSION: Patient presents to PT session motivated to participate. Focused on ankle strengthening and coordination, dual tasking with ambulation, and balance. Hip strategy utilized with balance activities due to limited ankle strength and ROM. Patient will benefit from skilled physical therapy to improve strength, mobility, and stability for improved quality of life.   OBJECTIVE IMPAIRMENTS: Abnormal gait, decreased activity tolerance, decreased balance, decreased coordination, decreased endurance, decreased mobility, difficulty walking, decreased ROM, decreased strength, hypomobility, impaired flexibility, improper body  mechanics, and postural dysfunction.   ACTIVITY LIMITATIONS: carrying, lifting, bending, standing, squatting, stairs, transfers, reach over head, locomotion level, and caring for others  PARTICIPATION LIMITATIONS: meal prep, cleaning, laundry, personal finances, driving, shopping, community activity, and yard work  PERSONAL FACTORS: Age, Past/current experiences, Time since onset of injury/illness/exacerbation, and 3+ comorbidities: DHD, arthritis, concussion COVID, HTN, multiple lacunar infarcts, MVP, pernicious anemia, rocky mountain spotted fever, spinal stenosis, vestibular migraine, vitamin B deficiency.   are also affecting patient's functional outcome.   REHAB POTENTIAL: Good  CLINICAL DECISION MAKING: Evolving/moderate complexity  EVALUATION COMPLEXITY: Moderate  PLAN:  PT FREQUENCY: 1x/week  PT DURATION: 12 weeks  PLANNED INTERVENTIONS: Therapeutic exercises, Therapeutic activity, Neuromuscular re-education, Balance training, Gait training, Patient/Family education, Self Care, Joint mobilization, Stair training, Vestibular training, Canalith repositioning, Visual/preceptual remediation/compensation, Orthotic/Fit training, DME instructions, Cognitive remediation, Electrical stimulation, Spinal mobilization, Cryotherapy, Moist heat, Splintting, Taping, Traction, Ultrasound, Manual therapy, and Re-evaluation  PLAN FOR NEXT SESSION:  ankle strengthening, sciatic nerve address    Precious Bard, PT 12/05/2022, 4:49 PM

## 2022-12-13 ENCOUNTER — Encounter: Payer: Self-pay | Admitting: Nurse Practitioner

## 2022-12-13 ENCOUNTER — Ambulatory Visit: Payer: 59

## 2022-12-13 ENCOUNTER — Ambulatory Visit (INDEPENDENT_AMBULATORY_CARE_PROVIDER_SITE_OTHER): Payer: 59 | Admitting: Nurse Practitioner

## 2022-12-13 ENCOUNTER — Ambulatory Visit: Payer: Self-pay | Admitting: *Deleted

## 2022-12-13 VITALS — BP 112/73 | HR 75 | Temp 98.3°F | Wt 151.0 lb

## 2022-12-13 DIAGNOSIS — J069 Acute upper respiratory infection, unspecified: Secondary | ICD-10-CM

## 2022-12-13 DIAGNOSIS — R0981 Nasal congestion: Secondary | ICD-10-CM | POA: Diagnosis not present

## 2022-12-13 DIAGNOSIS — J029 Acute pharyngitis, unspecified: Secondary | ICD-10-CM | POA: Diagnosis not present

## 2022-12-13 NOTE — Telephone Encounter (Signed)
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

## 2022-12-13 NOTE — Telephone Encounter (Signed)
  Chief Complaint: congestion, Sore Throat Symptoms: Sore throat, cough, chest congestion, body aches. O2 SAts 91-92% Frequency: Last night Pertinent Negatives: Patient denies fever,wheezing Disposition: ED /[] Urgent Care (no appt availability in office) / Appointment(In office/virtual)/  [place] [facility]/ Home Care/ Refused Recommended Disposition /[] [place] [facility]/  Follow-up with PCP Additional Notes: Wife calling, on DPR, states pt asleep after taking benadryl. Reports sore throat, productive cough, body aches. 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 "One child seemed really sick.'  Tested x 2 for covid, neg.  Appt secured for today, first available, preferred [facility]. Care advise provided, verbalizes understanding.  Advised to wear masks to appt.

## 2022-12-13 NOTE — Progress Notes (Signed)
BP 112/73 (BP Location: Right Arm)   Pulse 75   Temp 98.3 F (36.8 C)   Wt 151 lb (68.5 kg)   BMI 22.96 kg/m    Subjective:    Patient ID: [name], male    DOB: [birth year], 62 y.o.   MRN: [id number]  HPI: [name] is a 62 y.o. male  Chief Complaint  Patient presents with   Cough   UPPER RESPIRATORY TRACT INFECTION Worst symptom: symptoms started last night.  Did an at home COVID test twice and was negative.  Fever: no Cough: yes- to clear congestion Shortness of breath: no Wheezing: no Chest pain: yes, with cough Chest tightness: no Chest congestion: yes Nasal congestion: yes Runny nose: yes Post nasal drip: yes Sneezing: no Sore throat: yes Swollen glands: no Sinus pressure: yes Headache: yes Face pain: no Toothache: no Ear pain: no bilateral Ear pressure: no bilateral Eyes red/itching: yes Eye drainage/crusting: no  Vomiting: no Rash: no Fatigue: yes Sick contacts: yes Strep contacts: no  Context: worse Recurrent sinusitis: no Relief with OTC cold/cough medications: yes  Treatments attempted: anti-histamine    Relevant past medical, surgical, family and social history reviewed and updated as indicated. Interim medical history since our last visit reviewed. Allergies and medications reviewed and updated.  Review of Systems  Constitutional:  Positive for fatigue. Negative for fever.  HENT:  Positive for congestion, postnasal drip, rhinorrhea, sinus pressure, sinus pain and sore throat. Negative for ear pain and sneezing.   Respiratory:  Positive for cough. Negative for chest tightness, shortness of breath and wheezing.   Gastrointestinal:  Negative for vomiting.  Skin:  Negative for rash.  Neurological:  Positive for headaches.    Per HPI unless specifically indicated above     Objective:    BP 112/73 (BP Location: Right Arm)   Pulse 75   Temp 98.3 F (36.8 C)   Wt 151 lb (68.5 kg)   BMI 22.96 kg/m   Wt Readings from  Last 3 Encounters:  12/13/22 151 lb (68.5 kg)  10/30/22 153 lb (69.4 kg)  10/23/22 153 lb 6.4 oz (69.6 kg)    Physical Exam Vitals and nursing note reviewed.  Constitutional:      General: He is not in acute distress.    Appearance: Normal appearance. He is not ill-appearing, toxic-appearing or diaphoretic.  HENT:     Head: Normocephalic.     Right Ear: External ear normal. A middle ear effusion is present.     Left Ear: External ear normal. A middle ear effusion is present.     Nose: Congestion and rhinorrhea present.     Right Sinus: No maxillary sinus tenderness or frontal sinus tenderness.     Left Sinus: No maxillary sinus tenderness or frontal sinus tenderness.     Mouth/Throat:     Mouth: Mucous membranes are moist.     Pharynx: Posterior oropharyngeal erythema present. No oropharyngeal exudate.  Eyes:     General:        Right eye: No discharge.        Left eye: No discharge.     Extraocular Movements: Extraocular movements intact.     Conjunctiva/sclera: Conjunctivae normal.     Pupils: Pupils are equal, round, and reactive to light.  Cardiovascular:     Rate and Rhythm: Normal rate and regular rhythm.     Heart sounds: No murmur heard. Pulmonary:     Effort: Pulmonary effort is normal. No respiratory distress.  Breath sounds: Normal breath sounds. No wheezing, rhonchi or rales.  Abdominal:     General: Abdomen is flat. Bowel sounds are normal.  Musculoskeletal:     Cervical back: Normal range of motion and neck supple.  Skin:    General: Skin is warm and dry.     Capillary Refill: Capillary refill takes less than 2 seconds.  Neurological:     General: No focal deficit present.     Mental Status: He is alert and oriented to person, place, and time.  Psychiatric:        Mood and Affect: Mood normal.        Behavior: Behavior normal.        Thought Content: Thought content normal.        Judgment: Judgment normal.     Results for orders placed or performed  in visit on 12/13/22  Rapid Strep screen([facility])   Specimen: Other   Other  Result Value Ref Range   Strep Gp A Ag, IA W/Reflex Negative Negative  Culture, Group A Strep   Other  Result Value Ref Range   Strep A Culture WILL FOLLOW   Veritor Flu A/B Waived  Result Value Ref Range   Influenza A Negative Negative   Influenza B Negative Negative      Assessment & Plan:   Problem List Items Addressed This Visit   None Visit Diagnoses     Viral upper respiratory tract infection    -  Primary   Flu and Strep neg in office.  Symptoms are likely viral.  Recommend over the counter symptom management. Stay well hydrated and rest. FU if not improved.   Sore throat       Relevant Orders   Rapid Strep screen([facility]) (Completed)   Nasal congestion       Relevant Orders   Novel Coronavirus, NAA ([facility])   Veritor Flu A/B Waived (Completed)        Follow up plan: Return if symptoms worsen or fail to improve.

## 2022-12-14 ENCOUNTER — Encounter: Payer: Self-pay | Admitting: Nurse Practitioner

## 2022-12-14 NOTE — Progress Notes (Signed)
Results discussed with patient and wife during visit.

## 2022-12-15 LAB — NOVEL CORONAVIRUS, NAA: SARS-CoV-2, NAA: NOT DETECTED

## 2022-12-16 LAB — RAPID STREP SCREEN (MED CTR MEBANE ONLY): Strep Gp A Ag, IA W/Reflex: NEGATIVE

## 2022-12-16 LAB — VERITOR FLU A/B WAIVED
Influenza A: NEGATIVE
Influenza B: NEGATIVE

## 2022-12-16 LAB — CULTURE, GROUP A STREP: Strep A Culture: NEGATIVE

## 2022-12-17 NOTE — Progress Notes (Signed)
Good Morning.  [name]'s COVID test and strep culture were both negative.

## 2022-12-20 ENCOUNTER — Ambulatory Visit: Payer: 59

## 2022-12-27 ENCOUNTER — Ambulatory Visit: Payer: 59 | Attending: Neurosurgery

## 2022-12-27 DIAGNOSIS — R262 Difficulty in walking, not elsewhere classified: Secondary | ICD-10-CM | POA: Diagnosis not present

## 2022-12-27 DIAGNOSIS — R2681 Unsteadiness on feet: Secondary | ICD-10-CM

## 2022-12-27 DIAGNOSIS — M6281 Muscle weakness (generalized): Secondary | ICD-10-CM | POA: Diagnosis not present

## 2022-12-27 NOTE — Therapy (Signed)
OUTPATIENT PHYSICAL THERAPY NEURO TREATMENT   Patient Name: [name] MRN: [id number] DOB:10/03/1960, 62 y.o., male Today's Date: 12/27/2022   PCP: Larae Grooms NP REFERRING PROVIDER: Susanne Borders PA  END OF SESSION:  PT End of Session - 12/27/22 1601     Visit Number 9    Number of Visits 12    Date for PT Re-Evaluation 01/02/23    Authorization Type 9/10 eval 2/21    PT Start Time 1600    PT Stop Time 1644    PT Time Calculation (min) 44 min    Equipment Utilized During Treatment Gait belt    Activity Tolerance Patient tolerated treatment well    Behavior During Therapy WFL for tasks assessed/performed                    Past Medical History:  Diagnosis Date   ADHD    Arthritis    Concussion    COVID 12/2020   Hypertension    Multiple lacunar infarcts (HCC)    MVP (mitral valve prolapse)    Pernicious anemia    [facility] spotted fever    Spinal stenosis    Vestibular migraine    Vitamin B12 deficiency    Vitamin D deficiency    Past Surgical History:  Procedure Laterality Date   ANTERIOR CERVICAL DECOMP/DISCECTOMY FUSION N/A 07/30/2022   Procedure: C4-7 ANTERIOR CERVICAL DISCECTOMY AND FUSION (GLOBUS HEDRON);  Surgeon: Venetia Night, MD;  Location: [facility] ORS;  Service: Neurosurgery;  Laterality: N/A;   BILATERAL CARPAL TUNNEL RELEASE     CERVICAL WOUND DEBRIDEMENT N/A 08/01/2022   Procedure: CERVICAL WOUND DEBRIDEMENT OF HEMATOMA;  Surgeon: Venetia Night, MD;  Location: [facility] ORS;  Service: Neurosurgery;  Laterality: N/A;   FRACTURE SURGERY Left 1984   intramedullary rod   hip bone transplant  1984   LEG SURGERY Left    oseotomy   REPAIR ANKLE LIGAMENT Right 1978   REPAIR KNEE LIGAMENT Bilateral    acl   TRIGGER FINGER RELEASE Bilateral    thumbs   Patient Active Problem List   Diagnosis Date Noted   Postoperative hematoma of musculoskeletal structure following musculoskeletal procedure 08/01/2022   Cervical  myelopathy (HCC) 07/30/2022   Cervical spinal stenosis 07/30/2022   Radiculopathy, cervical 07/30/2022   S/P cervical spinal fusion 07/30/2022   Vitamin D deficiency 06/25/2022   B12 deficiency 06/25/2022   Elevated LDL cholesterol level 06/25/2022   Prediabetes 03/27/2021   History of nausea- reported 01/27/2020 02/02/2020   Accidental fall from ladder 02/02/2020   Acute right-sided thoracic back pain 02/02/2020   Neck stiffness 02/02/2020   Concussion with no loss of consciousness 02/02/2020   Multiple falls 02/02/2020   Head trauma, initial encounter 02/02/2020   Acute post-traumatic headache, not intractable 02/02/2020   Essential hypertension 08/26/2018   Pre-bariatric surgery nutrition evaluation 06/19/2018   Migraine with vertigo 01/05/2014   Arthralgia of multiple joints 04/28/2009   Carpal tunnel syndrome 08/30/2008    ONSET DATE: summer 2022  REFERRING DIAG: LE weakness and gait instability   THERAPY DIAG:  Muscle weakness (generalized)  Unsteadiness on feet  Difficulty in walking, not elsewhere classified  Rationale for Evaluation and Treatment: Rehabilitation  SUBJECTIVE:  SUBJECTIVE STATEMENT: Patient returned back to work; Patient retuning from three week absence after being ill.  Was bedbound for a week.   Pt accompanied by: significant other  PERTINENT HISTORY:  Patient presents for LE weakness and gait instability. He is s/p ACDF C-7 on 07/30/22. Patient has a leg length discrepancy and limited L knee flexion since his 20's. Patient has a lot of atrophy and lower back pain, foot drop bilaterally.  First symptoms was stumbling up stairs, with LLE. Foot drop was improved after ACDF. Now has numbness and tingling at the end of the day. Does have a few lesions in his pons per  wife, on blood pressure medication now. PMH includes ADHD, arthritis, concussion COVID, HTN, multiple lacunar infarcts, MVP, pernicious anemia, rocky mountain spotted fever, spinal stenosis, vestibular migraine, vitamin B deficiency.   PAIN:  Are you having pain? Yes: NPRS scale: patient does not give number/10 Pain location: low back, L knee Pain description: aching Aggravating factors: prolonged walking Relieving factors: laying down  PRECAUTIONS: Other: cervical; no lifting more than 20 lb   WEIGHT BEARING RESTRICTIONS: No  FALLS: Has patient fallen in last 6 months? Yes. Number of falls multiple a week  LIVING ENVIRONMENT: Lives with: lives with their family Lives in: House/apartment Stairs: Yes: Internal: flight steps; is sleeping downstairs Has following equipment at home: Single point cane  PLOF: Independent, uses a walking stick  PATIENT GOALS: gait control of feet, strengthen legs, balance, atrophy in hand  OBJECTIVE:   DIAGNOSTIC FINDINGS:  09/11/22: No acute fracture. No spondylolisthesis. No compression deformities. Disc space narrowing C3-4 with marginal osteophytes consistent with degenerative disc disease. Postop changes ACDF C4-C7 with anatomic alignment. Normal prevertebral and cervicocranial soft tissues.   06/19/22: 1. L5 chronic pars defects with L5-S1 anterolisthesis and advanced disc degeneration causing marked biforaminal L5 impingement. 2. Degeneration and short pedicles causes compressive spinal stenosis from L1-2 to L3-4, most advanced at L2-3. Moderate bilateral foraminal narrowing at the same levels.   COGNITION: Overall cognitive status: Within functional limits for tasks assessed    COORDINATION: Limited LLE  MUSCLE LENGTH: Significant limitation L hamstring; 60 degree flexion deficit.   POSTURE:  weight shift to the R  LOWER EXTREMITY ROM:     L knee flexion contraction -60 contraction LOWER EXTREMITY MMT:    MMT Right Eval  Left Eval  Hip flexion 4 4  Hip extension    Hip abduction 4- 4-  Hip adduction 4- 4-  Hip internal rotation    Hip external rotation    Knee flexion 4- 3  Knee extension 4 4  Ankle dorsiflexion 3 2+  Ankle plantarflexion 3 3  Ankle inversion    Ankle eversion    (Blank rows = not tested)  BED MOBILITY:  Perform next session  TRANSFERS: Assistive device utilized: Single point cane  Sit to stand: CGA Stand to sit: CGA Chair to chair: CGA   GAIT: Gait pattern: L knee limited extension, L foot drop, L foot eversion Distance walked: 1200 Assistive device utilized: Single point cane Level of assistance: CGA Comments: Patient has increased eversion and rolling of L foot, LLE leg length discrepancy.   FUNCTIONAL TESTS:  5 times sit to stand: 5.6 seconds with minor use of LLE no UE support Timed up and go (TUG): n/a 6 minute walk test: 1200 ft 10 meter walk test: 7 seconds BERG: 36/56   PATIENT SURVEYS:  FOTO 54  TODAY'S TREATMENT:  DATE: 12/27/22   Neuro Re-ed:  Activity Description: one pod per stair ; 6" steps, UE support  Activity Setting:  The Blaze Pod Random setting was chosen to enhance cognitive processing and agility, providing an unpredictable environment to simulate real-world scenarios, and fostering quick reactions and adaptability.   Number of Pods:  6 Cycles/Sets:  2 Duration (Time or Hit Count):  10  Activity Description: 3 on floor 3 on desk Activity Setting:  The Blaze Pod Random setting was chosen to enhance cognitive processing and agility, providing an unpredictable environment to simulate real-world scenarios, and fostering quick reactions and adaptability.   Number of Pods:  6 Cycles/Sets:  3 Duration (Time or Hit Count):  30 seconds  Patient Stats   Hits:   39, 36, 37    Static standing on half foam roller   with fingertips on bar 2 x 30 seconds  Single leg stance 30 seconds LLE   TherEx: Heel toe rocker on half bolster x 10 4" step: lateral lunge onto step 12x each LE   Seated:  Ankle eversion x 15 each LE x4 trials ; very challenging 3lb ankle weights: -march 15x each LE -hip extension 10x each LE cue for glute squeeze -seated LAQ with soccer ball kicks 4 minutes      PATIENT EDUCATION: Education details: goals, POC Person educated: Patient and Spouse Education method: Explanation, Demonstration, Tactile cues, and Verbal cues Education comprehension: verbalized understanding, returned demonstration, verbal cues required, and tactile cues required  HOME EXERCISE PROGRAM: Access Code: [email or web address] URL: [email or web address] Date: 10/11/2022 Prepared by: Precious Bard  Exercises - Seated Ankle Alphabet  - 1 x daily - 7 x weekly - 2 sets - 10 reps - 5 hold - Seated Heel Toe Raises  - 1 x daily - 7 x weekly - 2 sets - 10 reps - 5 hold - Standing March with Counter Support  - 1 x daily - 7 x weekly - 2 sets - 10 reps - 5 hold  Access Code: [email or web address] URL: [email or web address] Date: 10/30/2022 Prepared by: Precious Bard  Exercises - Seated Ankle Plantarflexion with Resistance  - 1 x daily - 7 x weekly - 2 sets - 10 reps - 5 hold - Seated Ankle Eversion with Resistance  - 1 x daily - 7 x weekly - 2 sets - 10 reps - 5 hold  Access Code: [email or web address] URL: [email or web address] Date: 11/14/2022 Prepared by: Precious Bard  Exercises - Seated Heel Toe Raises  - 1 x daily - 7 x weekly - 2 sets - 10 reps - 5 hold - Seated Hip Internal Rotation AROM  - 1 x daily - 7 x weekly - 2 sets - 10 reps - 5 hold - Bilateral Long Arc Quad  - 1 x daily - 7 x weekly - 2 sets - 10 reps - 5 hold - Foam Balance Tandem stance  - 1 x daily - 7 x weekly - 2 sets - 2 reps - 30 hold - Single Leg Balance on Foam  - 1 x daily - 7 x weekly - 2 sets - 30 reps - 2 hold -  Seated Hip Flexion March with Ankle Weights  - 1 x daily - 7 x weekly - 2 sets - 10 reps - 5 hold - Standing Hip Abduction with Ankle Weight  - 1 x daily - 7 x weekly - 2 sets - 10 reps - 5 hold - Standing Hip Extension with Ankle Weight  - 1 x daily -  7 x weekly - 2 sets - 10 reps - 5 hold - Standing Hip Flexion with Ankle Weight  - 1 x daily - 7 x weekly - 2 sets - 10 reps - 5 hold - Seated Long Arc Quad with Ankle Weight  - 1 x daily - 7 x weekly - 2 sets - 10 reps - 5 hold   GOALS: Goals reviewed with patient? Yes  SHORT TERM GOALS: Target date: 11/07/2022    Patient will be independent in home exercise program to improve strength/mobility for better functional independence with ADLs. Baseline: 2/21: HEP give next session Goal status: INITIAL    LONG TERM GOALS: Target date: 01/02/2023    Patient will increase FOTO score to equal to or greater than  64%   to demonstrate statistically significant improvement in mobility and quality of life.  Baseline: 2/21: 54% Goal status: INITIAL  2.  Patient will increase Berg Balance score by > 6 points (42/56)to demonstrate decreased fall risk during functional activities. Baseline: 2/21: 36/56 Goal status: INITIAL  3.  Patient will increase six minute walk test distance to >1500 for progression to age norm community ambulator and improve gait ability Baseline: 2/21: 1200 ft with SPC Goal status: INITIAL  4.  Patient will increase BLE gross strength to 4+/5 as to improve functional strength for independent gait, increased standing tolerance and increased ADL ability. Baseline: 2/21 :see above Goal status: INITIAL   ASSESSMENT:  CLINICAL IMPRESSION: Patient returns after 3 week absence from illness. Patient is eager to progress his mobility. Eversion of L foot is significantly limited and patient educated on compliance with eversion strengthening exercise. Patient tolerates single limb stability tasks but does fatigue this session.   Patient will benefit from skilled physical therapy to improve strength, mobility, and stability for improved quality of life.   OBJECTIVE IMPAIRMENTS: Abnormal gait, decreased activity tolerance, decreased balance, decreased coordination, decreased endurance, decreased mobility, difficulty walking, decreased ROM, decreased strength, hypomobility, impaired flexibility, improper body mechanics, and postural dysfunction.   ACTIVITY LIMITATIONS: carrying, lifting, bending, standing, squatting, stairs, transfers, reach over head, locomotion level, and caring for others  PARTICIPATION LIMITATIONS: meal prep, cleaning, laundry, personal finances, driving, shopping, community activity, and yard work  PERSONAL FACTORS: Age, Past/current experiences, Time since onset of injury/illness/exacerbation, and 3+ comorbidities: DHD, arthritis, concussion COVID, HTN, multiple lacunar infarcts, MVP, pernicious anemia, rocky mountain spotted fever, spinal stenosis, vestibular migraine, vitamin B deficiency.   are also affecting patient's functional outcome.   REHAB POTENTIAL: Good  CLINICAL DECISION MAKING: Evolving/moderate complexity  EVALUATION COMPLEXITY: Moderate  PLAN:  PT FREQUENCY: 1x/week  PT DURATION: 12 weeks  PLANNED INTERVENTIONS: Therapeutic exercises, Therapeutic activity, Neuromuscular re-education, Balance training, Gait training, Patient/Family education, Self Care, Joint mobilization, Stair training, Vestibular training, Canalith repositioning, Visual/preceptual remediation/compensation, Orthotic/Fit training, DME instructions, Cognitive remediation, Electrical stimulation, Spinal mobilization, Cryotherapy, Moist heat, Splintting, Taping, Traction, Ultrasound, Manual therapy, and Re-evaluation  PLAN FOR NEXT SESSION:  ankle strengthening, sciatic nerve address    Precious Bard, PT 12/27/2022, 4:49 PM

## 2022-12-31 ENCOUNTER — Ambulatory Visit: Payer: 59 | Admitting: Nurse Practitioner

## 2023-01-01 ENCOUNTER — Telehealth: Payer: Self-pay | Admitting: Psychology

## 2023-01-01 NOTE — Telephone Encounter (Signed)
Spoke to wife regarding concerns with patients continuity of care. Her expectation is that patient is treated at a minimum on a monthly basis. Given the providers very limited availability that currently is not an expectation that can be met. When provider met with patient he made him aware of the potential in not being able to get him in on a monthly basis given his schedule. Patient has made progress and agreed to this. Wife states this will not work and has requested names of other practices/therapist patient can be referred too as he needs consistent psychiatric appointments.   Have requested list of therapist from provider.

## 2023-01-02 ENCOUNTER — Ambulatory Visit: Payer: 59

## 2023-01-02 DIAGNOSIS — M6281 Muscle weakness (generalized): Secondary | ICD-10-CM

## 2023-01-02 DIAGNOSIS — R262 Difficulty in walking, not elsewhere classified: Secondary | ICD-10-CM

## 2023-01-02 DIAGNOSIS — R2681 Unsteadiness on feet: Secondary | ICD-10-CM | POA: Diagnosis not present

## 2023-01-02 NOTE — Therapy (Signed)
OUTPATIENT PHYSICAL THERAPY NEURO TREATMENT/ RECERT/ Physical Therapy Progress Note   Dates of reporting period  10/10/22   to   01/02/23    Patient Name: [name] MRN: [id number] DOB:1961-07-06, 62 y.o., male Today's Date: 01/02/2023   PCP: Larae Grooms NP REFERRING PROVIDER: Susanne Borders PA  END OF SESSION:  PT End of Session - 01/02/23 1118     Visit Number 10    Number of Visits 22    Date for PT Re-Evaluation 03/27/23    Authorization Type 10/10 eval 2/21    PT Start Time 1115    PT Stop Time 1146    PT Time Calculation (min) 31 min    Equipment Utilized During Treatment Gait belt    Activity Tolerance Patient tolerated treatment well    Behavior During Therapy [facility] for tasks assessed/performed                     Past Medical History:  Diagnosis Date   ADHD    Arthritis    Concussion    COVID 12/2020   Hypertension    Multiple lacunar infarcts (HCC)    MVP (mitral valve prolapse)    Pernicious anemia    [facility] spotted fever    Spinal stenosis    Vestibular migraine    Vitamin B12 deficiency    Vitamin D deficiency    Past Surgical History:  Procedure Laterality Date   ANTERIOR CERVICAL DECOMP/DISCECTOMY FUSION N/A 07/30/2022   Procedure: C4-7 ANTERIOR CERVICAL DISCECTOMY AND FUSION (GLOBUS HEDRON);  Surgeon: Venetia Night, MD;  Location: [facility] ORS;  Service: Neurosurgery;  Laterality: N/A;   BILATERAL CARPAL TUNNEL RELEASE     CERVICAL WOUND DEBRIDEMENT N/A 08/01/2022   Procedure: CERVICAL WOUND DEBRIDEMENT OF HEMATOMA;  Surgeon: Venetia Night, MD;  Location: [facility] ORS;  Service: Neurosurgery;  Laterality: N/A;   FRACTURE SURGERY Left 1984   intramedullary rod   hip bone transplant  1984   LEG SURGERY Left    oseotomy   REPAIR ANKLE LIGAMENT Right 1978   REPAIR KNEE LIGAMENT Bilateral    acl   TRIGGER FINGER RELEASE Bilateral    thumbs   Patient Active Problem List   Diagnosis Date Noted    Postoperative hematoma of musculoskeletal structure following musculoskeletal procedure 08/01/2022   Cervical myelopathy (HCC) 07/30/2022   Cervical spinal stenosis 07/30/2022   Radiculopathy, cervical 07/30/2022   S/P cervical spinal fusion 07/30/2022   Vitamin D deficiency 06/25/2022   B12 deficiency 06/25/2022   Elevated LDL cholesterol level 06/25/2022   Prediabetes 03/27/2021   History of nausea- reported 01/27/2020 02/02/2020   Accidental fall from ladder 02/02/2020   Acute right-sided thoracic back pain 02/02/2020   Neck stiffness 02/02/2020   Concussion with no loss of consciousness 02/02/2020   Multiple falls 02/02/2020   Head trauma, initial encounter 02/02/2020   Acute post-traumatic headache, not intractable 02/02/2020   Essential hypertension 08/26/2018   Pre-bariatric surgery nutrition evaluation 06/19/2018   Migraine with vertigo 01/05/2014   Arthralgia of multiple joints 04/28/2009   Carpal tunnel syndrome 08/30/2008    ONSET DATE: summer 2022  REFERRING DIAG: LE weakness and gait instability   THERAPY DIAG:  Muscle weakness (generalized)  Unsteadiness on feet  Difficulty in walking, not elsewhere classified  Rationale for Evaluation and Treatment: Rehabilitation  SUBJECTIVE:  SUBJECTIVE STATEMENT: Patient reports he feels like in general he is improving. Feels like he is 30% back to baseline.  Wants to work on endurance and strength. Would like to add upper body strengthening to POC.   Pt accompanied by: significant other  PERTINENT HISTORY:  Patient presents for LE weakness and gait instability. He is s/p ACDF C-7 on 07/30/22. Patient has a leg length discrepancy and limited L knee flexion since his 20's. Patient has a lot of atrophy and lower back pain, foot drop  bilaterally.  First symptoms was stumbling up stairs, with LLE. Foot drop was improved after ACDF. Now has numbness and tingling at the end of the day. Does have a few lesions in his pons per wife, on blood pressure medication now. PMH includes ADHD, arthritis, concussion COVID, HTN, multiple lacunar infarcts, MVP, pernicious anemia, rocky mountain spotted fever, spinal stenosis, vestibular migraine, vitamin B deficiency.   PAIN:  Are you having pain? Yes: NPRS scale: patient does not give number/10 Pain location: low back, L knee Pain description: aching Aggravating factors: prolonged walking Relieving factors: laying down  PRECAUTIONS: Other: cervical; no lifting more than 20 lb   WEIGHT BEARING RESTRICTIONS: No  FALLS: Has patient fallen in last 6 months? Yes. Number of falls multiple a week  LIVING ENVIRONMENT: Lives with: lives with their family Lives in: House/apartment Stairs: Yes: Internal: flight steps; is sleeping downstairs Has following equipment at home: Single point cane  PLOF: Independent, uses a walking stick  PATIENT GOALS: gait control of feet, strengthen legs, balance, atrophy in hand  OBJECTIVE:   DIAGNOSTIC FINDINGS:  09/11/22: No acute fracture. No spondylolisthesis. No compression deformities. Disc space narrowing C3-4 with marginal osteophytes consistent with degenerative disc disease. Postop changes ACDF C4-C7 with anatomic alignment. Normal prevertebral and cervicocranial soft tissues.   06/19/22: 1. L5 chronic pars defects with L5-S1 anterolisthesis and advanced disc degeneration causing marked biforaminal L5 impingement. 2. Degeneration and short pedicles causes compressive spinal stenosis from L1-2 to L3-4, most advanced at L2-3. Moderate bilateral foraminal narrowing at the same levels.   COGNITION: Overall cognitive status: Within functional limits for tasks assessed    COORDINATION: Limited LLE  MUSCLE LENGTH: Significant limitation L  hamstring; 60 degree flexion deficit.   POSTURE:  weight shift to the R  LOWER EXTREMITY ROM:     L knee flexion contraction -60 contraction LOWER EXTREMITY MMT:    MMT Right Eval R 5/15 Left Eval L 5/15  Hip flexion 4 5 4  4+  Hip extension      Hip abduction 4- 4 4- 4  Hip adduction 4- 4 4- 4-  Hip internal rotation      Hip external rotation      Knee flexion 4- 4 3 4-  Knee extension 4 4+ 4 4  Ankle dorsiflexion 3 3 2+ 2+  Ankle plantarflexion 3 3+ 3 3+  Ankle inversion      Ankle eversion      (Blank rows = not tested)  01/02/23 Right Left  Shoulder Flexion 4 4-  Shoulder Abduction 4- 4-  Shoulder Extension 4- 4-  Shoulder adduction 4- 3+  Bicep  4 4  Triceps 4- 3+      BED MOBILITY:  Perform next session  TRANSFERS: Assistive device utilized: Single point cane  Sit to stand: CGA Stand to sit: CGA Chair to chair: CGA   GAIT: Gait pattern: L knee limited extension, L foot drop, L foot eversion Distance walked: 1200 Assistive device  utilized: Single point cane Level of assistance: CGA Comments: Patient has increased eversion and rolling of L foot, LLE leg length discrepancy.   FUNCTIONAL TESTS:  5 times sit to stand: 5.6 seconds with minor use of LLE no UE support Timed up and go (TUG): n/a 6 minute walk test: 1200 ft 10 meter walk test: 7 seconds BERG: 36/56   PATIENT SURVEYS:  FOTO 54  TODAY'S TREATMENT:                                                                                                                              DATE: 01/02/23 Goals: see below for details;      PATIENT EDUCATION: Education details: goals, POC Person educated: Patient and Spouse Education method: Explanation, Demonstration, Tactile cues, and Verbal cues Education comprehension: verbalized understanding, returned demonstration, verbal cues required, and tactile cues required  HOME EXERCISE PROGRAM: Access Code: [email or web address] URL:  [email or web address] Date: 10/11/2022 Prepared by: Precious Bard  Exercises - Seated Ankle Alphabet  - 1 x daily - 7 x weekly - 2 sets - 10 reps - 5 hold - Seated Heel Toe Raises  - 1 x daily - 7 x weekly - 2 sets - 10 reps - 5 hold - Standing March with Counter Support  - 1 x daily - 7 x weekly - 2 sets - 10 reps - 5 hold  Access Code: [email or web address] URL: [email or web address] Date: 10/30/2022 Prepared by: Precious Bard  Exercises - Seated Ankle Plantarflexion with Resistance  - 1 x daily - 7 x weekly - 2 sets - 10 reps - 5 hold - Seated Ankle Eversion with Resistance  - 1 x daily - 7 x weekly - 2 sets - 10 reps - 5 hold  Access Code: [email or web address] URL: [email or web address] Date: 11/14/2022 Prepared by: Precious Bard  Exercises - Seated Heel Toe Raises  - 1 x daily - 7 x weekly - 2 sets - 10 reps - 5 hold - Seated Hip Internal Rotation AROM  - 1 x daily - 7 x weekly - 2 sets - 10 reps - 5 hold - Bilateral Long Arc Quad  - 1 x daily - 7 x weekly - 2 sets - 10 reps - 5 hold - Foam Balance Tandem stance  - 1 x daily - 7 x weekly - 2 sets - 2 reps - 30 hold - Single Leg Balance on Foam  - 1 x daily - 7 x weekly - 2 sets - 30 reps - 2 hold - Seated Hip Flexion March with Ankle Weights  - 1 x daily - 7 x weekly - 2 sets - 10 reps - 5 hold - Standing Hip Abduction with Ankle Weight  - 1 x daily - 7 x weekly - 2 sets - 10 reps - 5 hold - Standing Hip Extension with Ankle Weight  - 1 x daily - 7 x weekly - 2 sets - 10 reps - 5 hold - Standing  Hip Flexion with Ankle Weight  - 1 x daily - 7 x weekly - 2 sets - 10 reps - 5 hold - Seated Long Arc Quad with Ankle Weight  - 1 x daily - 7 x weekly - 2 sets - 10 reps - 5 hold   GOALS: Goals reviewed with patient? Yes  SHORT TERM GOALS: Target date:01/30/2023      Patient will be independent in home exercise program to improve strength/mobility for better functional independence with ADLs. Baseline: 2/21: HEP  give next session 5/15 : intermittent compliance  Goal status: progress    LONG TERM GOALS: Target date: 03/27/2023      Patient will increase FOTO score to equal to or greater than  64%   to demonstrate statistically significant improvement in mobility and quality of life.  Baseline: 2/21: 54% Goal status: INITIAL  2.  Patient will increase Berg Balance score by > 6 points (42/56)to demonstrate decreased fall risk during functional activities. Baseline: 2/21: 36/56 5/15: 45  Goal status: MET  3.  Patient will increase six minute walk test distance to >1500 for progression to age norm community ambulator and improve gait ability Baseline: 2/21: 1200 ft with [facility] 5/15: 1300 ft with [facility]  Goal status: In progress  4.  Patient will increase BLE gross strength to 4+/5 as to improve functional strength for independent gait, increased standing tolerance and increased ADL ability. Baseline: 2/21 :see above 5/15: see above  Goal status:  In progress   6.  Patient will increase Berg Balance score by > 6 points (51/56) to demonstrate decreased fall risk during functional activities. Baseline:  5/15: 45  Goal status: NEW 7.   Patient will increase UE strength to >4+/5 for ADLs, AD usage, and quality of life.  Baseline: 5/15: see above  Goal status: NEW   ASSESSMENT:  CLINICAL IMPRESSION: Patient is making significant progress towards functional goals at this time. Patient is eager to progress mobility and new goal addressing UE weakness added to POC due to impact on use of AD. BERG goal met and progressed. Progress note/recert limited by late arrival time of patient.  Patient's condition has the potential to improve in response to therapy. Maximum improvement is yet to be obtained. The anticipated improvement is attainable and reasonable in a generally predictable time. Patient will benefit from skilled physical therapy to improve strength, mobility, and stability for improved quality of life.    OBJECTIVE IMPAIRMENTS: Abnormal gait, decreased activity tolerance, decreased balance, decreased coordination, decreased endurance, decreased mobility, difficulty walking, decreased ROM, decreased strength, hypomobility, impaired flexibility, improper body mechanics, and postural dysfunction.   ACTIVITY LIMITATIONS: carrying, lifting, bending, standing, squatting, stairs, transfers, reach over head, locomotion level, and caring for others  PARTICIPATION LIMITATIONS: meal prep, cleaning, laundry, personal finances, driving, shopping, community activity, and yard work  PERSONAL FACTORS: Age, Past/current experiences, Time since onset of injury/illness/exacerbation, and 3+ comorbidities: DHD, arthritis, concussion COVID, HTN, multiple lacunar infarcts, MVP, pernicious anemia, rocky mountain spotted fever, spinal stenosis, vestibular migraine, vitamin B deficiency.   are also affecting patient's functional outcome.   REHAB POTENTIAL: Good  CLINICAL DECISION MAKING: Evolving/moderate complexity  EVALUATION COMPLEXITY: Moderate  PLAN:  PT FREQUENCY: 1x/week  PT DURATION: 12 weeks  PLANNED INTERVENTIONS: Therapeutic exercises, Therapeutic activity, Neuromuscular re-education, Balance training, Gait training, Patient/Family education, Self Care, Joint mobilization, Stair training, Vestibular training, Canalith repositioning, Visual/preceptual remediation/compensation, Orthotic/Fit training, DME instructions, Cognitive remediation, Electrical stimulation, Spinal mobilization, Cryotherapy, Moist heat, Splintting, Taping, Traction, Ultrasound, Manual  therapy, and Re-evaluation  PLAN FOR NEXT SESSION:  ankle strengthening, sciatic nerve address    Precious Bard, PT 01/02/2023, 11:53 AM

## 2023-01-03 ENCOUNTER — Ambulatory Visit: Payer: 59

## 2023-01-08 ENCOUNTER — Encounter: Payer: Self-pay | Admitting: Nurse Practitioner

## 2023-01-08 ENCOUNTER — Ambulatory Visit (INDEPENDENT_AMBULATORY_CARE_PROVIDER_SITE_OTHER): Payer: 59 | Admitting: Nurse Practitioner

## 2023-01-08 VITALS — BP 122/78 | HR 69 | Temp 98.0°F | Ht 67.99 in | Wt 149.0 lb

## 2023-01-08 DIAGNOSIS — E559 Vitamin D deficiency, unspecified: Secondary | ICD-10-CM

## 2023-01-08 DIAGNOSIS — I10 Essential (primary) hypertension: Secondary | ICD-10-CM

## 2023-01-08 DIAGNOSIS — E538 Deficiency of other specified B group vitamins: Secondary | ICD-10-CM | POA: Diagnosis not present

## 2023-01-08 DIAGNOSIS — R7303 Prediabetes: Secondary | ICD-10-CM | POA: Diagnosis not present

## 2023-01-08 DIAGNOSIS — E7800 Pure hypercholesterolemia, unspecified: Secondary | ICD-10-CM | POA: Diagnosis not present

## 2023-01-08 MED ORDER — CYANOCOBALAMIN 1000 MCG/ML IJ SOLN
1000.0000 ug | Freq: Once | INTRAMUSCULAR | Status: AC
  Administered 2023-01-08: 1000 ug via INTRAMUSCULAR

## 2023-01-08 NOTE — Addendum Note (Signed)
Addended by: Leward Quan A on: 01/08/2023 03:42 PM   Modules accepted: Orders

## 2023-01-08 NOTE — Assessment & Plan Note (Signed)
Labs ordered at visit today.  Will make recommendations based on lab results.   

## 2023-01-08 NOTE — Assessment & Plan Note (Signed)
Labs ordered at visit today.  Will make recommendations based on lab results. Injection given today.  Patient is currently getting b12 injections every other month.  Will likely change to every 3 months if labs are still well controlled.

## 2023-01-08 NOTE — Progress Notes (Signed)
BP 122/78   Pulse 69   Temp 98 F (36.7 C)   Ht 5' 7.99" (1.727 m)   Wt 149 lb (67.6 kg)   SpO2 100%   BMI 22.66 kg/m    Subjective:    Patient ID: [name], male    DOB: [birth year], 62 y.o.   MRN: [id number]  HPI: [name] is a 62 y.o. male  Chief Complaint  Patient presents with   B12 Injection   b-12 check   HYPERTENSION without Chronic Kidney Disease Hypertension status: controlled  Satisfied with current treatment? yes Duration of hypertension: years BP monitoring frequency:  not checking BP range:  BP medication side effects:  no Medication compliance: excellent compliance Previous BP meds:lisinopril Aspirin: no Recurrent headaches: no Visual changes: no Palpitations: no Dyspnea: no Chest pain: no Lower extremity edema: no Dizzy/lightheaded: no  His neck pain and ROM have improved.    He would like to get his b12 injection today.    Relevant past medical, surgical, family and social history reviewed and updated as indicated. Interim medical history since our last visit reviewed.  Allergies and medications reviewed and updated.  Review of Systems  Eyes:  Negative for visual disturbance.  Respiratory:  Negative for shortness of breath.   Cardiovascular:  Negative for chest pain and leg swelling.  Neurological:  Negative for light-headedness and headaches.    Per HPI unless specifically indicated above     Objective:    BP 122/78   Pulse 69   Temp 98 F (36.7 C)   Ht 5' 7.99" (1.727 m)   Wt 149 lb (67.6 kg)   SpO2 100%   BMI 22.66 kg/m   Wt Readings from Last 3 Encounters:  01/08/23 149 lb (67.6 kg)  12/13/22 151 lb (68.5 kg)  10/30/22 153 lb (69.4 kg)    Physical Exam Vitals and nursing note reviewed.  Constitutional:      General: He is not in acute distress.    Appearance: Normal appearance. He is not ill-appearing, toxic-appearing or diaphoretic.  HENT:     Head: Normocephalic.     Right Ear: External ear  normal.     Left Ear: External ear normal.     Nose: Nose normal. No congestion or rhinorrhea.     Mouth/Throat:     Mouth: Mucous membranes are moist.  Eyes:     General:        Right eye: No discharge.        Left eye: No discharge.     Extraocular Movements: Extraocular movements intact.     Conjunctiva/sclera: Conjunctivae normal.     Pupils: Pupils are equal, round, and reactive to light.  Cardiovascular:     Rate and Rhythm: Normal rate and regular rhythm.     Heart sounds: No murmur heard. Pulmonary:     Effort: Pulmonary effort is normal. No respiratory distress.     Breath sounds: Normal breath sounds. No wheezing, rhonchi or rales.  Abdominal:     General: Abdomen is flat. Bowel sounds are normal.  Musculoskeletal:     Cervical back: Normal range of motion and neck supple.  Skin:    General: Skin is warm and dry.     Capillary Refill: Capillary refill takes less than 2 seconds.  Neurological:     General: No focal deficit present.     Mental Status: He is alert and oriented to person, place, and time.  Psychiatric:  Mood and Affect: Mood normal.        Behavior: Behavior normal.        Thought Content: Thought content normal.        Judgment: Judgment normal.     Results for orders placed or performed in visit on 12/13/22  Novel Coronavirus, NAA ([facility])   Specimen: Nasopharyngeal(NP) swabs in vial transport medium  Result Value Ref Range   SARS-CoV-2, NAA Not Detected Not Detected  Rapid Strep screen([facility])   Specimen: Other   Other  Result Value Ref Range   Strep Gp A Ag, IA W/Reflex Negative Negative  Culture, Group A Strep   Other  Result Value Ref Range   Strep A Culture Negative   Veritor Flu A/B Waived  Result Value Ref Range   Influenza A Negative Negative   Influenza B Negative Negative      Assessment & Plan:   Problem List Items Addressed This Visit       Cardiovascular and Mediastinum   Essential hypertension -  Primary    Chronic.  Controlled.  Continue with current medication regimen of Lisinopril 20mg  daily.  Labs ordered today.  Return to clinic in 6 months for reevaluation.  Call sooner if concerns arise.        Relevant Orders   Comp Met (CMET)     Other   Prediabetes    Labs ordered at visit today.  Will make recommendations based on lab results.        Relevant Orders   HgB A1c   Vitamin D deficiency    Labs ordered at visit today.  Will make recommendations based on lab results.        Relevant Orders   Vitamin D (25 hydroxy)   B12 deficiency    Labs ordered at visit today.  Will make recommendations based on lab results. Injection given today.  Patient is currently getting b12 injections every other month.  Will likely change to every 3 months if labs are still well controlled.       Relevant Orders   B12   Elevated LDL cholesterol level    Labs ordered at visit today.  Will make recommendations based on lab results.        Relevant Orders   Lipid Profile     Follow up plan: Return in about 6 months (around 07/11/2023) for Physical and Fasting labs.   A total of 30 minutes were spent on this encounter today.  When total time is documented, this includes both the face-to-face and non-face-to-face time personally spent before, during and after the visit on the date of the encounter symptoms, surgery, plan of care and labs.

## 2023-01-08 NOTE — Assessment & Plan Note (Signed)
Chronic.  Controlled.  Continue with current medication regimen of Lisinopril 20mg  daily.  Labs ordered today.  Return to clinic in 6 months for reevaluation.  Call sooner if concerns arise.

## 2023-01-09 ENCOUNTER — Encounter: Payer: 59 | Attending: Psychology | Admitting: Psychology

## 2023-01-09 DIAGNOSIS — R41840 Attention and concentration deficit: Secondary | ICD-10-CM | POA: Insufficient documentation

## 2023-01-09 DIAGNOSIS — F840 Autistic disorder: Secondary | ICD-10-CM | POA: Diagnosis not present

## 2023-01-09 DIAGNOSIS — G3184 Mild cognitive impairment, so stated: Secondary | ICD-10-CM | POA: Insufficient documentation

## 2023-01-09 DIAGNOSIS — R413 Other amnesia: Secondary | ICD-10-CM | POA: Diagnosis not present

## 2023-01-09 LAB — LIPID PANEL
Chol/HDL Ratio: 2.8 ratio (ref 0.0–5.0)
Cholesterol, Total: 190 mg/dL (ref 100–199)
HDL: 68 mg/dL (ref >39)
LDL Chol Calc (NIH): 101 mg/dL — ABNORMAL HIGH (ref 0–99)
Triglycerides: 122 mg/dL (ref 0–149)
VLDL Cholesterol Cal: 21 mg/dL (ref 5–40)

## 2023-01-09 LAB — COMPREHENSIVE METABOLIC PANEL
ALT: 13 IU/L (ref 0–44)
AST: 19 IU/L (ref 0–40)
Albumin/Globulin Ratio: 2 (ref 1.2–2.2)
Albumin: 4.3 g/dL (ref 3.9–4.9)
Alkaline Phosphatase: 82 IU/L (ref 44–121)
BUN/Creatinine Ratio: 12 (ref 10–24)
BUN: 12 mg/dL (ref 8–27)
Bilirubin Total: 1.1 mg/dL (ref 0.0–1.2)
CO2: 25 mmol/L (ref 20–29)
Calcium: 9.4 mg/dL (ref 8.6–10.2)
Chloride: 101 mmol/L (ref 96–106)
Creatinine, Ser: 0.99 mg/dL (ref 0.76–1.27)
Globulin, Total: 2.1 g/dL (ref 1.5–4.5)
Glucose: 90 mg/dL (ref 70–99)
Potassium: 4.7 mmol/L (ref 3.5–5.2)
Sodium: 139 mmol/L (ref 134–144)
Total Protein: 6.4 g/dL (ref 6.0–8.5)
eGFR: 86 mL/min/{1.73_m2} (ref >59)

## 2023-01-09 LAB — HEMOGLOBIN A1C
Est. average glucose Bld gHb Est-mCnc: 128 mg/dL
Hgb A1c MFr Bld: 6.1 % — ABNORMAL HIGH (ref 4.8–5.6)

## 2023-01-09 LAB — VITAMIN D 25 HYDROXY (VIT D DEFICIENCY, FRACTURES): Vit D, 25-Hydroxy: 49.8 ng/mL (ref 30.0–100.0)

## 2023-01-09 LAB — VITAMIN B12: Vitamin B-12: 1038 pg/mL (ref 232–1245)

## 2023-01-09 NOTE — Therapy (Signed)
OUTPATIENT PHYSICAL THERAPY NEURO TREATMENT/ RECERT/ Physical Therapy Progress Note   Dates of reporting period  10/10/22   to   01/02/23    Patient Name: [name] MRN: [id number] DOB:July 18, 1961, 62 y.o., male Today's Date: 01/09/2023   PCP: Larae Grooms NP REFERRING PROVIDER: Susanne Borders PA  END OF SESSION:            Past Medical History:  Diagnosis Date   ADHD    Arthritis    Concussion    COVID 12/2020   Hypertension    Multiple lacunar infarcts (HCC)    MVP (mitral valve prolapse)    Pernicious anemia    [facility] spotted fever    Spinal stenosis    Vestibular migraine    Vitamin B12 deficiency    Vitamin D deficiency    Past Surgical History:  Procedure Laterality Date   ANTERIOR CERVICAL DECOMP/DISCECTOMY FUSION N/A 07/30/2022   Procedure: C4-7 ANTERIOR CERVICAL DISCECTOMY AND FUSION (GLOBUS HEDRON);  Surgeon: Venetia Night, MD;  Location: [facility] ORS;  Service: Neurosurgery;  Laterality: N/A;   BILATERAL CARPAL TUNNEL RELEASE     CERVICAL WOUND DEBRIDEMENT N/A 08/01/2022   Procedure: CERVICAL WOUND DEBRIDEMENT OF HEMATOMA;  Surgeon: Venetia Night, MD;  Location: [facility] ORS;  Service: Neurosurgery;  Laterality: N/A;   FRACTURE SURGERY Left 1984   intramedullary rod   hip bone transplant  1984   LEG SURGERY Left    oseotomy   REPAIR ANKLE LIGAMENT Right 1978   REPAIR KNEE LIGAMENT Bilateral    acl   TRIGGER FINGER RELEASE Bilateral    thumbs   Patient Active Problem List   Diagnosis Date Noted   Postoperative hematoma of musculoskeletal structure following musculoskeletal procedure 08/01/2022   Cervical myelopathy (HCC) 07/30/2022   Cervical spinal stenosis 07/30/2022   Radiculopathy, cervical 07/30/2022   S/P cervical spinal fusion 07/30/2022   Vitamin D deficiency 06/25/2022   B12 deficiency 06/25/2022   Elevated LDL cholesterol level 06/25/2022   Prediabetes 03/27/2021   History of nausea- reported 01/27/2020  02/02/2020   Accidental fall from ladder 02/02/2020   Acute right-sided thoracic back pain 02/02/2020   Neck stiffness 02/02/2020   Concussion with no loss of consciousness 02/02/2020   Multiple falls 02/02/2020   Head trauma, initial encounter 02/02/2020   Acute post-traumatic headache, not intractable 02/02/2020   Essential hypertension 08/26/2018   Pre-bariatric surgery nutrition evaluation 06/19/2018   Migraine with vertigo 01/05/2014   Arthralgia of multiple joints 04/28/2009   Carpal tunnel syndrome 08/30/2008    ONSET DATE: summer 2022  REFERRING DIAG: LE weakness and gait instability   THERAPY DIAG:  No diagnosis found.  Rationale for Evaluation and Treatment: Rehabilitation  SUBJECTIVE:  SUBJECTIVE STATEMENT: ***  Pt accompanied by: significant other  PERTINENT HISTORY:  Patient presents for LE weakness and gait instability. He is s/p ACDF C-7 on 07/30/22. Patient has a leg length discrepancy and limited L knee flexion since his 20's. Patient has a lot of atrophy and lower back pain, foot drop bilaterally.  First symptoms was stumbling up stairs, with LLE. Foot drop was improved after ACDF. Now has numbness and tingling at the end of the day. Does have a few lesions in his pons per wife, on blood pressure medication now. PMH includes ADHD, arthritis, concussion COVID, HTN, multiple lacunar infarcts, MVP, pernicious anemia, rocky mountain spotted fever, spinal stenosis, vestibular migraine, vitamin B deficiency.   PAIN:  Are you having pain? Yes: NPRS scale: patient does not give number/10 Pain location: low back, L knee Pain description: aching Aggravating factors: prolonged walking Relieving factors: laying down  PRECAUTIONS: Other: cervical; no lifting more than 20 lb   WEIGHT  BEARING RESTRICTIONS: No  FALLS: Has patient fallen in last 6 months? Yes. Number of falls multiple a week  LIVING ENVIRONMENT: Lives with: lives with their family Lives in: House/apartment Stairs: Yes: Internal: flight steps; is sleeping downstairs Has following equipment at home: Single point cane  PLOF: Independent, uses a walking stick  PATIENT GOALS: gait control of feet, strengthen legs, balance, atrophy in hand  OBJECTIVE:   DIAGNOSTIC FINDINGS:  09/11/22: No acute fracture. No spondylolisthesis. No compression deformities. Disc space narrowing C3-4 with marginal osteophytes consistent with degenerative disc disease. Postop changes ACDF C4-C7 with anatomic alignment. Normal prevertebral and cervicocranial soft tissues.   06/19/22: 1. L5 chronic pars defects with L5-S1 anterolisthesis and advanced disc degeneration causing marked biforaminal L5 impingement. 2. Degeneration and short pedicles causes compressive spinal stenosis from L1-2 to L3-4, most advanced at L2-3. Moderate bilateral foraminal narrowing at the same levels.   COGNITION: Overall cognitive status: Within functional limits for tasks assessed    COORDINATION: Limited LLE  MUSCLE LENGTH: Significant limitation L hamstring; 60 degree flexion deficit.   POSTURE:  weight shift to the R  LOWER EXTREMITY ROM:     L knee flexion contraction -60 contraction LOWER EXTREMITY MMT:    MMT Right Eval R 5/15 Left Eval L 5/15  Hip flexion 4 5 4  4+  Hip extension      Hip abduction 4- 4 4- 4  Hip adduction 4- 4 4- 4-  Hip internal rotation      Hip external rotation      Knee flexion 4- 4 3 4-  Knee extension 4 4+ 4 4  Ankle dorsiflexion 3 3 2+ 2+  Ankle plantarflexion 3 3+ 3 3+  Ankle inversion      Ankle eversion      (Blank rows = not tested)  01/02/23 Right Left  Shoulder Flexion 4 4-  Shoulder Abduction 4- 4-  Shoulder Extension 4- 4-  Shoulder adduction 4- 3+  Bicep  4 4  Triceps 4- 3+       BED MOBILITY:  Perform next session  TRANSFERS: Assistive device utilized: Single point cane  Sit to stand: CGA Stand to sit: CGA Chair to chair: CGA   GAIT: Gait pattern: L knee limited extension, L foot drop, L foot eversion Distance walked: 1200 Assistive device utilized: Single point cane Level of assistance: CGA Comments: Patient has increased eversion and rolling of L foot, LLE leg length discrepancy.   FUNCTIONAL TESTS:  5 times sit to stand: 5.6 seconds with  minor use of LLE no UE support Timed up and go (TUG): n/a 6 minute walk test: 1200 ft 10 meter walk test: 7 seconds BERG: 36/56   PATIENT SURVEYS:  FOTO 54  TODAY'S TREATMENT:                                                                                                                              DATE: 01/09/23  Neuro Re-ed:  Activity Description: one pod per stair ; 6" steps, UE support  Activity Setting:  The Blaze Pod Random setting was chosen to enhance cognitive processing and agility, providing an unpredictable environment to simulate real-world scenarios, and fostering quick reactions and adaptability.    Number of Pods:  6 Cycles/Sets:  2 Duration (Time or Hit Count):  10   Activity Description: 3 on floor 3 on desk Activity Setting:  The Blaze Pod Random setting was chosen to enhance cognitive processing and agility, providing an unpredictable environment to simulate real-world scenarios, and fostering quick reactions and adaptability.    Number of Pods:  6 Cycles/Sets:  3 Duration (Time or Hit Count):  30 seconds   Patient Stats    Hits:   39, 36, 37       Static standing on half foam roller  with fingertips on bar 2 x 30 seconds  Single leg stance 30 seconds LLE    TherEx: Heel toe rocker on half bolster x 10 4" step: lateral lunge onto step 12x each LE    Seated:   Ankle eversion x 15 each LE x4 trials ; very challenging 3lb ankle weights: -march 15x each LE -hip  extension 10x each LE cue for glute squeeze -seated LAQ with soccer ball kicks 4 minutes       PATIENT EDUCATION: Education details: goals, POC Person educated: Patient and Spouse Education method: Explanation, Demonstration, Tactile cues, and Verbal cues Education comprehension: verbalized understanding, returned demonstration, verbal cues required, and tactile cues required  HOME EXERCISE PROGRAM: Access Code: [email or web address] URL: [email or web address] Date: 10/11/2022 Prepared by: Precious Bard  Exercises - Seated Ankle Alphabet  - 1 x daily - 7 x weekly - 2 sets - 10 reps - 5 hold - Seated Heel Toe Raises  - 1 x daily - 7 x weekly - 2 sets - 10 reps - 5 hold - Standing March with Counter Support  - 1 x daily - 7 x weekly - 2 sets - 10 reps - 5 hold  Access Code: [email or web address] URL: [email or web address] Date: 10/30/2022 Prepared by: Precious Bard  Exercises - Seated Ankle Plantarflexion with Resistance  - 1 x daily - 7 x weekly - 2 sets - 10 reps - 5 hold - Seated Ankle Eversion with Resistance  - 1 x daily - 7 x weekly - 2 sets - 10 reps - 5 hold  Access Code: [email or web address] URL: [email or web address] Date: 11/14/2022 Prepared by: Precious Bard  Exercises - Seated Heel Toe Raises  - 1  x daily - 7 x weekly - 2 sets - 10 reps - 5 hold - Seated Hip Internal Rotation AROM  - 1 x daily - 7 x weekly - 2 sets - 10 reps - 5 hold - Bilateral Long Arc Quad  - 1 x daily - 7 x weekly - 2 sets - 10 reps - 5 hold - Foam Balance Tandem stance  - 1 x daily - 7 x weekly - 2 sets - 2 reps - 30 hold - Single Leg Balance on Foam  - 1 x daily - 7 x weekly - 2 sets - 30 reps - 2 hold - Seated Hip Flexion March with Ankle Weights  - 1 x daily - 7 x weekly - 2 sets - 10 reps - 5 hold - Standing Hip Abduction with Ankle Weight  - 1 x daily - 7 x weekly - 2 sets - 10 reps - 5 hold - Standing Hip Extension with Ankle Weight  - 1 x daily - 7 x weekly - 2 sets - 10 reps - 5  hold - Standing Hip Flexion with Ankle Weight  - 1 x daily - 7 x weekly - 2 sets - 10 reps - 5 hold - Seated Long Arc Quad with Ankle Weight  - 1 x daily - 7 x weekly - 2 sets - 10 reps - 5 hold   GOALS: Goals reviewed with patient? Yes  SHORT TERM GOALS: Target date:01/30/2023      Patient will be independent in home exercise program to improve strength/mobility for better functional independence with ADLs. Baseline: 2/21: HEP give next session 5/15 : intermittent compliance  Goal status: progress    LONG TERM GOALS: Target date: 03/27/2023      Patient will increase FOTO score to equal to or greater than  64%   to demonstrate statistically significant improvement in mobility and quality of life.  Baseline: 2/21: 54% Goal status: INITIAL  2.  Patient will increase Berg Balance score by > 6 points (42/56)to demonstrate decreased fall risk during functional activities. Baseline: 2/21: 36/56 5/15: 45  Goal status: MET  3.  Patient will increase six minute walk test distance to >1500 for progression to age norm community ambulator and improve gait ability Baseline: 2/21: 1200 ft with [facility] 5/15: 1300 ft with [facility]PC  Goal status: In progress  4.  Patient will increase BLE gross strength to 4+/5 as to improve functional strength for independent gait, increased standing tolerance and increased ADL ability. Baseline: 2/21 :see above 5/15: see above  Goal status:  In progress   6.  Patient will increase Berg Balance score by > 6 points (51/56) to demonstrate decreased fall risk during functional activities. Baseline:  5/15: 45  Goal status: NEW 7.   Patient will increase UE strength to >4+/5 for ADLs, AD usage, and quality of life.  Baseline: 5/15: see above  Goal status: NEW   ASSESSMENT:  CLINICAL IMPRESSION: *** Patient will benefit from skilled physical therapy to improve strength, mobility, and stability for improved quality of life.   OBJECTIVE IMPAIRMENTS: Abnormal gait,  decreased activity tolerance, decreased balance, decreased coordination, decreased endurance, decreased mobility, difficulty walking, decreased ROM, decreased strength, hypomobility, impaired flexibility, improper body mechanics, and postural dysfunction.   ACTIVITY LIMITATIONS: carrying, lifting, bending, standing, squatting, stairs, transfers, reach over head, locomotion level, and caring for others  PARTICIPATION LIMITATIONS: meal prep, cleaning, laundry, personal finances, driving, shopping, community activity, and yard work  PERSONAL FACTORS: Age, Past/current  experiences, Time since onset of injury/illness/exacerbation, and 3+ comorbidities: DHD, arthritis, concussion COVID, HTN, multiple lacunar infarcts, MVP, pernicious anemia, rocky mountain spotted fever, spinal stenosis, vestibular migraine, vitamin B deficiency.   are also affecting patient's functional outcome.   REHAB POTENTIAL: Good  CLINICAL DECISION MAKING: Evolving/moderate complexity  EVALUATION COMPLEXITY: Moderate  PLAN:  PT FREQUENCY: 1x/week  PT DURATION: 12 weeks  PLANNED INTERVENTIONS: Therapeutic exercises, Therapeutic activity, Neuromuscular re-education, Balance training, Gait training, Patient/Family education, Self Care, Joint mobilization, Stair training, Vestibular training, Canalith repositioning, Visual/preceptual remediation/compensation, Orthotic/Fit training, DME instructions, Cognitive remediation, Electrical stimulation, Spinal mobilization, Cryotherapy, Moist heat, Splintting, Taping, Traction, Ultrasound, Manual therapy, and Re-evaluation  PLAN FOR NEXT SESSION:  ankle strengthening, sciatic nerve address    Precious Bard, PT 01/09/2023, 1:24 PM

## 2023-01-09 NOTE — Progress Notes (Signed)
Hi [name] and [name]. It was nice to see you yesterday.  Your lab work looks good.  Your A1c did increase to 6.1%.  I recommend a low carb diet.  Cholesterol has improved from prior.  B12 is in the normal range.  We will continue with every other month injections at this time.  No concerns at this time. Continue with your current medication regimen.  Follow up as discussed.  Please let me know if you have any questions.

## 2023-01-10 ENCOUNTER — Ambulatory Visit: Payer: 59

## 2023-01-10 DIAGNOSIS — M6281 Muscle weakness (generalized): Secondary | ICD-10-CM

## 2023-01-10 DIAGNOSIS — R2681 Unsteadiness on feet: Secondary | ICD-10-CM

## 2023-01-10 DIAGNOSIS — R262 Difficulty in walking, not elsewhere classified: Secondary | ICD-10-CM

## 2023-01-16 ENCOUNTER — Encounter: Payer: Self-pay | Admitting: Psychology
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
## 2023-01-17 ENCOUNTER — Ambulatory Visit: Payer: 59

## 2023-01-17 ENCOUNTER — Other Ambulatory Visit: Payer: Self-pay

## 2023-01-17 DIAGNOSIS — R262 Difficulty in walking, not elsewhere classified: Secondary | ICD-10-CM | POA: Diagnosis not present

## 2023-01-17 DIAGNOSIS — M6281 Muscle weakness (generalized): Secondary | ICD-10-CM | POA: Diagnosis not present

## 2023-01-17 DIAGNOSIS — R2681 Unsteadiness on feet: Secondary | ICD-10-CM | POA: Diagnosis not present

## 2023-01-17 NOTE — Therapy (Signed)
OUTPATIENT PHYSICAL THERAPY NEURO TREATMENT   Patient Name: [name] MRN: [id number] DOB:Nov 30, 1960, 62 y.o., male Today's Date: 01/17/2023   PCP: Larae Grooms NP REFERRING PROVIDER: Susanne Borders PA  END OF SESSION:  PT End of Session - 01/17/23 1602     Visit Number 12    Number of Visits 22    Date for PT Re-Evaluation 03/27/23    Authorization Type 10/10 eval 2/21    PT Start Time 1602    PT Stop Time 1644    PT Time Calculation (min) 42 min    Equipment Utilized During Treatment Gait belt    Activity Tolerance Patient tolerated treatment well    Behavior During Therapy [facility] for tasks assessed/performed                       Past Medical History:  Diagnosis Date   ADHD    Arthritis    Concussion    COVID 12/2020   Hypertension    Multiple lacunar infarcts (HCC)    MVP (mitral valve prolapse)    Pernicious anemia    [facility] spotted fever    Spinal stenosis    Vestibular migraine    Vitamin B12 deficiency    Vitamin D deficiency    Past Surgical History:  Procedure Laterality Date   ANTERIOR CERVICAL DECOMP/DISCECTOMY FUSION N/A 07/30/2022   Procedure: C4-7 ANTERIOR CERVICAL DISCECTOMY AND FUSION (GLOBUS HEDRON);  Surgeon: Venetia Night, MD;  Location: [facility] ORS;  Service: Neurosurgery;  Laterality: N/A;   BILATERAL CARPAL TUNNEL RELEASE     CERVICAL WOUND DEBRIDEMENT N/A 08/01/2022   Procedure: CERVICAL WOUND DEBRIDEMENT OF HEMATOMA;  Surgeon: Venetia Night, MD;  Location: [facility] ORS;  Service: Neurosurgery;  Laterality: N/A;   FRACTURE SURGERY Left 1984   intramedullary rod   hip bone transplant  1984   LEG SURGERY Left    oseotomy   REPAIR ANKLE LIGAMENT Right 1978   REPAIR KNEE LIGAMENT Bilateral    acl   TRIGGER FINGER RELEASE Bilateral    thumbs   Patient Active Problem List   Diagnosis Date Noted   Postoperative hematoma of musculoskeletal structure following musculoskeletal procedure 08/01/2022    Cervical myelopathy (HCC) 07/30/2022   Cervical spinal stenosis 07/30/2022   Radiculopathy, cervical 07/30/2022   S/P cervical spinal fusion 07/30/2022   Vitamin D deficiency 06/25/2022   B12 deficiency 06/25/2022   Elevated LDL cholesterol level 06/25/2022   Prediabetes 03/27/2021   History of nausea- reported 01/27/2020 02/02/2020   Accidental fall from ladder 02/02/2020   Acute right-sided thoracic back pain 02/02/2020   Neck stiffness 02/02/2020   Concussion with no loss of consciousness 02/02/2020   Multiple falls 02/02/2020   Head trauma, initial encounter 02/02/2020   Acute post-traumatic headache, not intractable 02/02/2020   Essential hypertension 08/26/2018   Pre-bariatric surgery nutrition evaluation 06/19/2018   Migraine with vertigo 01/05/2014   Arthralgia of multiple joints 04/28/2009   Carpal tunnel syndrome 08/30/2008    ONSET DATE: summer 2022  REFERRING DIAG: LE weakness and gait instability   THERAPY DIAG:  Muscle weakness (generalized)  Unsteadiness on feet  Difficulty in walking, not elsewhere classified  Rationale for Evaluation and Treatment: Rehabilitation  SUBJECTIVE:  SUBJECTIVE STATEMENT: Pt reports falling once in the last 7 days in pt's backyard, stating he didn't pay attention to where he placed his foot. Denies any injuries.  Pt accompanied by: self  PERTINENT HISTORY:  Patient presents for LE weakness and gait instability. He is s/p ACDF C-7 on 07/30/22. Patient has a leg length discrepancy and limited L knee flexion since his 20's. Patient has a lot of atrophy and lower back pain, foot drop bilaterally.  First symptoms was stumbling up stairs, with LLE. Foot drop was improved after ACDF. Now has numbness and tingling at the end of the day. Does have a few  lesions in his pons per wife, on blood pressure medication now. PMH includes ADHD, arthritis, concussion COVID, HTN, multiple lacunar infarcts, MVP, pernicious anemia, rocky mountain spotted fever, spinal stenosis, vestibular migraine, vitamin B deficiency.   PAIN:  Are you having pain?  Denies pain   PRECAUTIONS: Other: cervical; no lifting more than 20 lb   WEIGHT BEARING RESTRICTIONS: No  FALLS: Has patient fallen in last 6 months? Yes. Number of falls multiple a week  LIVING ENVIRONMENT: Lives with: lives with their family Lives in: House/apartment Stairs: Yes: Internal: flight steps; is sleeping downstairs Has following equipment at home: Single point cane  PLOF: Independent, uses a walking stick  PATIENT GOALS: gait control of feet, strengthen legs, balance, atrophy in hand  OBJECTIVE:   DIAGNOSTIC FINDINGS:  09/11/22: No acute fracture. No spondylolisthesis. No compression deformities. Disc space narrowing C3-4 with marginal osteophytes consistent with degenerative disc disease. Postop changes ACDF C4-C7 with anatomic alignment. Normal prevertebral and cervicocranial soft tissues.   06/19/22: 1. L5 chronic pars defects with L5-S1 anterolisthesis and advanced disc degeneration causing marked biforaminal L5 impingement. 2. Degeneration and short pedicles causes compressive spinal stenosis from L1-2 to L3-4, most advanced at L2-3. Moderate bilateral foraminal narrowing at the same levels.   COGNITION: Overall cognitive status: Within functional limits for tasks assessed    COORDINATION: Limited LLE  MUSCLE LENGTH: Significant limitation L hamstring; 60 degree flexion deficit.   POSTURE:  weight shift to the R  LOWER EXTREMITY ROM:     L knee flexion contraction -60 contraction LOWER EXTREMITY MMT:    MMT Right Eval R 5/15 Left Eval L 5/15  Hip flexion 4 5 4  4+  Hip extension      Hip abduction 4- 4 4- 4  Hip adduction 4- 4 4- 4-  Hip internal rotation       Hip external rotation      Knee flexion 4- 4 3 4-  Knee extension 4 4+ 4 4  Ankle dorsiflexion 3 3 2+ 2+  Ankle plantarflexion 3 3+ 3 3+  Ankle inversion      Ankle eversion      (Blank rows = not tested)  01/02/23 Right Left  Shoulder Flexion 4 4-  Shoulder Abduction 4- 4-  Shoulder Extension 4- 4-  Shoulder adduction 4- 3+  Bicep  4 4  Triceps 4- 3+      BED MOBILITY:  Perform next session  TRANSFERS: Assistive device utilized: Single point cane  Sit to stand: CGA Stand to sit: CGA Chair to chair: CGA   GAIT: Gait pattern: L knee limited extension, L foot drop, L foot eversion Distance walked: 1200 Assistive device utilized: Single point cane Level of assistance: CGA Comments: Patient has increased eversion and rolling of L foot, LLE leg length discrepancy.   FUNCTIONAL TESTS:  5 times sit to stand:  5.6 seconds with minor use of LLE no UE support Timed up and go (TUG): n/a 6 minute walk test: 1200 ft 10 meter walk test: 7 seconds BERG: 36/56   PATIENT SURVEYS:  FOTO 54  TODAY'S TREATMENT:                                                                                                                              DATE: 01/17/23  Neuro Re-ed:  -15 x standing alt marches on Airex pad to simulate balance on uneven surfaces such as grass. Intermittent UE support, CGA.  -30 sec x standing balance w/ feet apart on Airex pad w/ eyes closed for strengthening of vestibular balance, CGA. Noted increase in unsteadiness w/ posterior sway.  -30 sec x standing balance w/ feet together for increased balance w/ change to BOS, and strengthening of ankle musculature.  -30 sec of ", with eyes closed.  -60 sec B: SLS on Airex with CLE doing counterclock / clockwise turns to improve balance w/ dynamic task, with BUE, CGA. VC to slow down speed of ball turns. Noted increase lateral trunk lean R > L.     TherEx: -3 x 10: hammer curls with 15# KB in standing w/ CGA for  strengthening of BUE. VC for core activation and breath control. KB used to simulate a water jug at home.  -2 x 5 B: "Woodchops" via D1/D2 pattern, ". Pt educated on eccentric control. - 5 x front raise to eye level in seating w/ 15# KB; discontinued w/ 15# KB after 5 due to lack of control w/ weight, and switched to 5# DB x 10. VC for scapular retraction, neutral elbows, and isometric contraction of palmar force into DB.  -15 x B: seated ankle circular motion clockwise / counterclockwise for ankle musculature w/ emphasis on full ROM.  -2 x10B: standing knee flexion w/ UUE support, CGA. Emphasis on as full of ROM as possible on LLE.    Manual:  30 sec DF, 30 sec PF re: Therapist assisted gastroc-soleus complex stretch w/ pt seated for improved tissue extensibility on LLE.    PATIENT EDUCATION: Education details: goals, POC, educated on safety during updated exercises  Person educated: Patient and Spouse Education method: Explanation, Demonstration, Tactile cues, and Verbal cues Education comprehension: verbalized understanding, returned demonstration, verbal cues required, and tactile cues required  HOME EXERCISE PROGRAM: Access Code: [email or web address] URL: [email or web address] Date: 10/11/2022 Prepared by: Precious Bard  Exercises - Seated Ankle Alphabet  - 1 x daily - 7 x weekly - 2 sets - 10 reps - 5 hold - Seated Heel Toe Raises  - 1 x daily - 7 x weekly - 2 sets - 10 reps - 5 hold - Standing March with Counter Support  - 1 x daily - 7 x weekly - 2 sets - 10 reps - 5 hold  Access Code: [email or web address] URL: [email or web address] Date: 10/30/2022 Prepared by: Precious Bard  Exercises - Seated Ankle Plantarflexion  with Resistance  - 1 x daily - 7 x weekly - 2 sets - 10 reps - 5 hold - Seated Ankle Eversion with Resistance  - 1 x daily - 7 x weekly - 2 sets - 10 reps - 5 hold  Access Code: [email or web address] URL: [email or web address] Date: 11/14/2022 Prepared  by: Precious Bard  Exercises - Seated Heel Toe Raises  - 1 x daily - 7 x weekly - 2 sets - 10 reps - 5 hold - Seated Hip Internal Rotation AROM  - 1 x daily - 7 x weekly - 2 sets - 10 reps - 5 hold - Bilateral Long Arc Quad  - 1 x daily - 7 x weekly - 2 sets - 10 reps - 5 hold - Foam Balance Tandem stance  - 1 x daily - 7 x weekly - 2 sets - 2 reps - 30 hold - Single Leg Balance on Foam  - 1 x daily - 7 x weekly - 2 sets - 30 reps - 2 hold - Seated Hip Flexion March with Ankle Weights  - 1 x daily - 7 x weekly - 2 sets - 10 reps - 5 hold - Standing Hip Abduction with Ankle Weight  - 1 x daily - 7 x weekly - 2 sets - 10 reps - 5 hold - Standing Hip Extension with Ankle Weight  - 1 x daily - 7 x weekly - 2 sets - 10 reps - 5 hold - Standing Hip Flexion with Ankle Weight  - 1 x daily - 7 x weekly - 2 sets - 10 reps - 5 hold - Seated Long Arc Quad with Ankle Weight  - 1 x daily - 7 x weekly - 2 sets - 10 reps - 5 hold   GOALS: Goals reviewed with patient? Yes  SHORT TERM GOALS: Target date:01/30/2023      Patient will be independent in home exercise program to improve strength/mobility for better functional independence with ADLs. Baseline: 2/21: HEP give next session 5/15 : intermittent compliance  Goal status: progress    LONG TERM GOALS: Target date: 03/27/2023      Patient will increase FOTO score to equal to or greater than  64%   to demonstrate statistically significant improvement in mobility and quality of life.  Baseline: 2/21: 54% Goal status: INITIAL  2.  Patient will increase Berg Balance score by > 6 points (42/56)to demonstrate decreased fall risk during functional activities. Baseline: 2/21: 36/56 5/15: 45  Goal status: MET  3.  Patient will increase six minute walk test distance to >1500 for progression to age norm community ambulator and improve gait ability Baseline: 2/21: 1200 ft with [facility] 5/15: 1300 ft with [facility]  Goal status: In progress  4.  Patient will  increase BLE gross strength to 4+/5 as to improve functional strength for independent gait, increased standing tolerance and increased ADL ability. Baseline: 2/21 :see above 5/15: see above  Goal status:  In progress   6.  Patient will increase Berg Balance score by > 6 points (51/56) to demonstrate decreased fall risk during functional activities. Baseline:  5/15: 45  Goal status: NEW 7.   Patient will increase UE strength to >4+/5 for ADLs, AD usage, and quality of life.  Baseline: 5/15: see above  Goal status: NEW   ASSESSMENT:  CLINICAL IMPRESSION: Pt has had another fall on the grass since last session. More balance activities were introduced to replicate changes in surfaces during ambulation; patient requires BUE  support throughout and has intermittent trunk lean and posterior sway. Emphasis on having a chair behind patient throughout standing exercises for safety while at home, pt verbalized and demonstrated understanding. Plans to continue progression of BUE strengthening as well as dynamic balance activities. Pt advised to use a heavier cane while walking on grass or woods. Patient will benefit from skilled physical therapy to improve strength, mobility, and stability for improved quality of life.   OBJECTIVE IMPAIRMENTS: Abnormal gait, decreased activity tolerance, decreased balance, decreased coordination, decreased endurance, decreased mobility, difficulty walking, decreased ROM, decreased strength, hypomobility, impaired flexibility, improper body mechanics, and postural dysfunction.   ACTIVITY LIMITATIONS: carrying, lifting, bending, standing, squatting, stairs, transfers, reach over head, locomotion level, and caring for others  PARTICIPATION LIMITATIONS: meal prep, cleaning, laundry, personal finances, driving, shopping, community activity, and yard work  PERSONAL FACTORS: Age, Past/current experiences, Time since onset of injury/illness/exacerbation, and 3+ comorbidities: DHD,  arthritis, concussion COVID, HTN, multiple lacunar infarcts, MVP, pernicious anemia, rocky mountain spotted fever, spinal stenosis, vestibular migraine, vitamin B deficiency.   are also affecting patient's functional outcome.   REHAB POTENTIAL: Good  CLINICAL DECISION MAKING: Evolving/moderate complexity  EVALUATION COMPLEXITY: Moderate  PLAN:   PT FREQUENCY: 1x/week  PT DURATION: 12 weeks  PLANNED INTERVENTIONS: Therapeutic exercises, Therapeutic activity, Neuromuscular re-education, Balance training, Gait training, Patient/Family education, Self Care, Joint mobilization, Stair training, Vestibular training, Canalith repositioning, Visual/preceptual remediation/compensation, Orthotic/Fit training, DME instructions, Cognitive remediation, Electrical stimulation, Spinal mobilization, Cryotherapy, Moist heat, Splintting, Taping, Traction, Ultrasound, Manual therapy, and Re-evaluation  PLAN FOR NEXT SESSION:  Continue to progress UE strength w/ use of DB (pt has them at home) and balance for decreasing risk of falls.    Judith Blonder, Student-PT 01/17/2023, 4:54 PM  This entire session was performed under direct supervision and direction of a licensed therapist/therapist assistant . I have personally read, edited and approve of the note as written.  Precious Bard, PT, DPT Physical Therapist - [place] [facility]  Outpatient Physical Therapy- Main Campus [phone]

## 2023-01-24 ENCOUNTER — Ambulatory Visit: Payer: 59

## 2023-01-28 DIAGNOSIS — H2513 Age-related nuclear cataract, bilateral: Secondary | ICD-10-CM | POA: Diagnosis not present

## 2023-01-28 DIAGNOSIS — H40013 Open angle with borderline findings, low risk, bilateral: Secondary | ICD-10-CM | POA: Diagnosis not present

## 2023-01-28 NOTE — Therapy (Signed)
OUTPATIENT PHYSICAL THERAPY NEURO TREATMENT   Patient Name: [name] MRN: [id number] DOB:29-Jul-1961, 62 y.o., male Today's Date: 01/29/2023   PCP: Larae Grooms NP REFERRING PROVIDER: Susanne Borders PA  END OF SESSION:  PT End of Session - 01/29/23 1559     Visit Number 13    Number of Visits 22    Date for PT Re-Evaluation 03/27/23    Authorization Type eval 2/21    PT Start Time 1600    PT Stop Time 1644    PT Time Calculation (min) 44 min    Equipment Utilized During Treatment Gait belt    Activity Tolerance Patient tolerated treatment well    Behavior During Therapy [facility] for tasks assessed/performed                        Past Medical History:  Diagnosis Date   ADHD    Arthritis    Concussion    COVID 12/2020   Hypertension    Multiple lacunar infarcts (HCC)    MVP (mitral valve prolapse)    Pernicious anemia    [facility] [place] spotted fever    Spinal stenosis    Vestibular migraine    Vitamin B12 deficiency    Vitamin D deficiency    Past Surgical History:  Procedure Laterality Date   ANTERIOR CERVICAL DECOMP/DISCECTOMY FUSION N/A 07/30/2022   Procedure: C4-7 ANTERIOR CERVICAL DISCECTOMY AND FUSION (GLOBUS HEDRON);  Surgeon: Venetia Night, MD;  Location: [facility] ORS;  Service: Neurosurgery;  Laterality: N/A;   BILATERAL CARPAL TUNNEL RELEASE     CERVICAL WOUND DEBRIDEMENT N/A 08/01/2022   Procedure: CERVICAL WOUND DEBRIDEMENT OF HEMATOMA;  Surgeon: Venetia Night, MD;  Location: [facility] ORS;  Service: Neurosurgery;  Laterality: N/A;   FRACTURE SURGERY Left 1984   intramedullary rod   hip bone transplant  1984   LEG SURGERY Left    oseotomy   REPAIR ANKLE LIGAMENT Right 1978   REPAIR KNEE LIGAMENT Bilateral    acl   TRIGGER FINGER RELEASE Bilateral    thumbs   Patient Active Problem List   Diagnosis Date Noted   Postoperative hematoma of musculoskeletal structure following musculoskeletal procedure 08/01/2022    Cervical myelopathy (HCC) 07/30/2022   Cervical spinal stenosis 07/30/2022   Radiculopathy, cervical 07/30/2022   S/P cervical spinal fusion 07/30/2022   Vitamin D deficiency 06/25/2022   B12 deficiency 06/25/2022   Elevated LDL cholesterol level 06/25/2022   Prediabetes 03/27/2021   History of nausea- reported 01/27/2020 02/02/2020   Accidental fall from ladder 02/02/2020   Acute right-sided thoracic back pain 02/02/2020   Neck stiffness 02/02/2020   Concussion with no loss of consciousness 02/02/2020   Multiple falls 02/02/2020   Head trauma, initial encounter 02/02/2020   Acute post-traumatic headache, not intractable 02/02/2020   Essential hypertension 08/26/2018   Pre-bariatric surgery nutrition evaluation 06/19/2018   Migraine with vertigo 01/05/2014   Arthralgia of multiple joints 04/28/2009   Carpal tunnel syndrome 08/30/2008    ONSET DATE: summer 2022  REFERRING DIAG: LE weakness and gait instability   THERAPY DIAG:  Muscle weakness (generalized)  Unsteadiness on feet  Difficulty in walking, not elsewhere classified  Rationale for Evaluation and Treatment: Rehabilitation  SUBJECTIVE:  SUBJECTIVE STATEMENT: Patient reports one fall since last week, was working on a bike and fell in a parking lot. Was pulling bike off the bike rack. Fell and hit hands then side.  Pt accompanied by: self  PERTINENT HISTORY:  Patient presents for LE weakness and gait instability. He is s/p ACDF C-7 on 07/30/22. Patient has a leg length discrepancy and limited L knee flexion since his 20's. Patient has a lot of atrophy and lower back pain, foot drop bilaterally.  First symptoms was stumbling up stairs, with LLE. Foot drop was improved after ACDF. Now has numbness and tingling at the end of the day. Does  have a few lesions in his pons per wife, on blood pressure medication now. PMH includes ADHD, arthritis, concussion COVID, HTN, multiple lacunar infarcts, MVP, pernicious anemia, rocky mountain spotted fever, spinal stenosis, vestibular migraine, vitamin B deficiency.   PAIN:  Are you having pain?  Denies pain   PRECAUTIONS: Other: cervical; no lifting more than 20 lb   WEIGHT BEARING RESTRICTIONS: No  FALLS: Has patient fallen in last 6 months? Yes. Number of falls multiple a week  LIVING ENVIRONMENT: Lives with: lives with their family Lives in: House/apartment Stairs: Yes: Internal: flight steps; is sleeping downstairs Has following equipment at home: Single point cane  PLOF: Independent, uses a walking stick  PATIENT GOALS: gait control of feet, strengthen legs, balance, atrophy in hand  OBJECTIVE:   DIAGNOSTIC FINDINGS:  09/11/22: No acute fracture. No spondylolisthesis. No compression deformities. Disc space narrowing C3-4 with marginal osteophytes consistent with degenerative disc disease. Postop changes ACDF C4-C7 with anatomic alignment. Normal prevertebral and cervicocranial soft tissues.   06/19/22: 1. L5 chronic pars defects with L5-S1 anterolisthesis and advanced disc degeneration causing marked biforaminal L5 impingement. 2. Degeneration and short pedicles causes compressive spinal stenosis from L1-2 to L3-4, most advanced at L2-3. Moderate bilateral foraminal narrowing at the same levels.   COGNITION: Overall cognitive status: Within functional limits for tasks assessed    COORDINATION: Limited LLE  MUSCLE LENGTH: Significant limitation L hamstring; 60 degree flexion deficit.   POSTURE:  weight shift to the R  LOWER EXTREMITY ROM:     L knee flexion contraction -60 contraction LOWER EXTREMITY MMT:    MMT Right Eval R 5/15 Left Eval L 5/15  Hip flexion 4 5 4  4+  Hip extension      Hip abduction 4- 4 4- 4  Hip adduction 4- 4 4- 4-  Hip internal  rotation      Hip external rotation      Knee flexion 4- 4 3 4-  Knee extension 4 4+ 4 4  Ankle dorsiflexion 3 3 2+ 2+  Ankle plantarflexion 3 3+ 3 3+  Ankle inversion      Ankle eversion      (Blank rows = not tested)  01/02/23 Right Left  Shoulder Flexion 4 4-  Shoulder Abduction 4- 4-  Shoulder Extension 4- 4-  Shoulder adduction 4- 3+  Bicep  4 4  Triceps 4- 3+      BED MOBILITY:  Perform next session  TRANSFERS: Assistive device utilized: Single point cane  Sit to stand: CGA Stand to sit: CGA Chair to chair: CGA   GAIT: Gait pattern: L knee limited extension, L foot drop, L foot eversion Distance walked: 1200 Assistive device utilized: Single point cane Level of assistance: CGA Comments: Patient has increased eversion and rolling of L foot, LLE leg length discrepancy.   FUNCTIONAL TESTS:  5 times sit to stand: 5.6 seconds with minor use of LLE no UE support Timed up and go (TUG): n/a 6 minute walk test: 1200 ft 10 meter walk test: 7 seconds BERG: 36/56   PATIENT SURVEYS:  FOTO 54  TODAY'S TREATMENT:                                                                                                                              DATE: 01/29/23  Neuro Re-ed:  -on airex pad: bicep curls 15x with dumbbells x2 sets    TherEx: Dumbbells: -bent row 12x; with cue for bent position and chair behind patient 5lb DB; x 2 sets  -farmer carry 80 ft x 2 trials with 5lb DB  -sit to stand with 5lb DB press 10x  At counter top: -hip flexor stretch: modified lunge position; cue for hip tilt/scoop to sky and bend front knee. 30 second holds x2 trials   Seated: -df/pf 20x  -red slider eversion LLE 15x x 2 trials with AAROM decrease to AROM -resisted PF into PT foot x 15  PATIENT EDUCATION: Education details: goals, POC, educated on safety during updated exercises  Person educated: Patient and Spouse Education method: Explanation, Demonstration, Tactile cues, and Verbal  cues Education comprehension: verbalized understanding, returned demonstration, verbal cues required, and tactile cues required  HOME EXERCISE PROGRAM: Access Code: [email or web address] URL: [email or web address] Date: 10/11/2022 Prepared by: Precious Bard  Exercises - Seated Ankle Alphabet  - 1 x daily - 7 x weekly - 2 sets - 10 reps - 5 hold - Seated Heel Toe Raises  - 1 x daily - 7 x weekly - 2 sets - 10 reps - 5 hold - Standing March with Counter Support  - 1 x daily - 7 x weekly - 2 sets - 10 reps - 5 hold  Access Code: [email or web address] URL: [email or web address] Date: 10/30/2022 Prepared by: Precious Bard  Exercises - Seated Ankle Plantarflexion with Resistance  - 1 x daily - 7 x weekly - 2 sets - 10 reps - 5 hold - Seated Ankle Eversion with Resistance  - 1 x daily - 7 x weekly - 2 sets - 10 reps - 5 hold  Access Code: [email or web address] URL: [email or web address] Date: 11/14/2022 Prepared by: Precious Bard  Exercises - Seated Heel Toe Raises  - 1 x daily - 7 x weekly - 2 sets - 10 reps - 5 hold - Seated Hip Internal Rotation AROM  - 1 x daily - 7 x weekly - 2 sets - 10 reps - 5 hold - Bilateral Long Arc Quad  - 1 x daily - 7 x weekly - 2 sets - 10 reps - 5 hold - Foam Balance Tandem stance  - 1 x daily - 7 x weekly - 2 sets - 2 reps - 30 hold - Single Leg Balance on Foam  - 1 x daily - 7 x weekly - 2 sets - 30 reps - 2 hold -  Seated Hip Flexion March with Ankle Weights  - 1 x daily - 7 x weekly - 2 sets - 10 reps - 5 hold - Standing Hip Abduction with Ankle Weight  - 1 x daily - 7 x weekly - 2 sets - 10 reps - 5 hold - Standing Hip Extension with Ankle Weight  - 1 x daily - 7 x weekly - 2 sets - 10 reps - 5 hold - Standing Hip Flexion with Ankle Weight  - 1 x daily - 7 x weekly - 2 sets - 10 reps - 5 hold - Seated Long Arc Quad with Ankle Weight  - 1 x daily - 7 x weekly - 2 sets - 10 reps - 5 hold   GOALS: Goals reviewed with patient? Yes  SHORT TERM  GOALS: Target date:01/30/2023      Patient will be independent in home exercise program to improve strength/mobility for better functional independence with ADLs. Baseline: 2/21: HEP give next session 5/15 : intermittent compliance  Goal status: progress    LONG TERM GOALS: Target date: 03/27/2023      Patient will increase FOTO score to equal to or greater than  64%   to demonstrate statistically significant improvement in mobility and quality of life.  Baseline: 2/21: 54% Goal status: INITIAL  2.  Patient will increase Berg Balance score by > 6 points (42/56)to demonstrate decreased fall risk during functional activities. Baseline: 2/21: 36/56 5/15: 45  Goal status: MET  3.  Patient will increase six minute walk test distance to >1500 for progression to age norm community ambulator and improve gait ability Baseline: 2/21: 1200 ft with [facility] 5/15: 1300 ft with SPC  Goal status: In progress  4.  Patient will increase BLE gross strength to 4+/5 as to improve functional strength for independent gait, increased standing tolerance and increased ADL ability. Baseline: 2/21 :see above 5/15: see above  Goal status:  In progress   6.  Patient will increase Berg Balance score by > 6 points (51/56) to demonstrate decreased fall risk during functional activities. Baseline:  5/15: 45  Goal status: NEW 7.   Patient will increase UE strength to >4+/5 for ADLs, AD usage, and quality of life.  Baseline: 5/15: see above  Goal status: NEW   ASSESSMENT:  CLINICAL IMPRESSION: Hip flexor stretch introduced and tolerated well as well as UE strengthening for posterior chain strength. R hip pain noted, patient presents with signs and symptoms consistent with OA. Patient educated on possibility of OA. Educated on plan for pain reduction with patient agreeable. Eversion is improving with decreased need for assistance with AAROM this session.   Patient will benefit from skilled physical therapy to  improve strength, mobility, and stability for improved quality of life.   OBJECTIVE IMPAIRMENTS: Abnormal gait, decreased activity tolerance, decreased balance, decreased coordination, decreased endurance, decreased mobility, difficulty walking, decreased ROM, decreased strength, hypomobility, impaired flexibility, improper body mechanics, and postural dysfunction.   ACTIVITY LIMITATIONS: carrying, lifting, bending, standing, squatting, stairs, transfers, reach over head, locomotion level, and caring for others  PARTICIPATION LIMITATIONS: meal prep, cleaning, laundry, personal finances, driving, shopping, community activity, and yard work  PERSONAL FACTORS: Age, Past/current experiences, Time since onset of injury/illness/exacerbation, and 3+ comorbidities: DHD, arthritis, concussion COVID, HTN, multiple lacunar infarcts, MVP, pernicious anemia, rocky mountain spotted fever, spinal stenosis, vestibular migraine, vitamin B deficiency.   are also affecting patient's functional outcome.   REHAB POTENTIAL: Good  CLINICAL DECISION MAKING: Evolving/moderate complexity  EVALUATION COMPLEXITY:  Moderate  PLAN:   PT FREQUENCY: 1x/week  PT DURATION: 12 weeks  PLANNED INTERVENTIONS: Therapeutic exercises, Therapeutic activity, Neuromuscular re-education, Balance training, Gait training, Patient/Family education, Self Care, Joint mobilization, Stair training, Vestibular training, Canalith repositioning, Visual/preceptual remediation/compensation, Orthotic/Fit training, DME instructions, Cognitive remediation, Electrical stimulation, Spinal mobilization, Cryotherapy, Moist heat, Splintting, Taping, Traction, Ultrasound, Manual therapy, and Re-evaluation  PLAN FOR NEXT SESSION:  Continue to progress UE strength w/ use of DB (pt has them at home) and balance for decreasing risk of falls.     Precious Bard, PT, DPT Physical Therapist - [facility]  Outpatient Physical  Therapy- Main Campus [phone]

## 2023-01-29 ENCOUNTER — Ambulatory Visit: Payer: 59 | Attending: Neurosurgery

## 2023-01-29 DIAGNOSIS — M6281 Muscle weakness (generalized): Secondary | ICD-10-CM | POA: Diagnosis not present

## 2023-01-29 DIAGNOSIS — R262 Difficulty in walking, not elsewhere classified: Secondary | ICD-10-CM | POA: Diagnosis not present

## 2023-01-29 DIAGNOSIS — R2681 Unsteadiness on feet: Secondary | ICD-10-CM | POA: Diagnosis not present

## 2023-02-04 DIAGNOSIS — H2513 Age-related nuclear cataract, bilateral: Secondary | ICD-10-CM | POA: Diagnosis not present

## 2023-02-04 DIAGNOSIS — H40013 Open angle with borderline findings, low risk, bilateral: Secondary | ICD-10-CM | POA: Diagnosis not present

## 2023-02-04 DIAGNOSIS — H5111 Convergence insufficiency: Secondary | ICD-10-CM | POA: Diagnosis not present

## 2023-02-04 NOTE — Therapy (Signed)
OUTPATIENT PHYSICAL THERAPY NEURO TREATMENT   Patient Name: [name] MRN: [id number] DOB:26-Sep-1960, 62 y.o., male Today's Date: 02/05/2023   PCP: Larae Grooms NP REFERRING PROVIDER: Susanne Borders PA  END OF SESSION:  PT End of Session - 02/05/23 1308     Visit Number 14    Number of Visits 22    Date for PT Re-Evaluation 03/27/23    Authorization Type eval 2/21    PT Start Time 1306    PT Stop Time 1345    PT Time Calculation (min) 39 min    Equipment Utilized During Treatment Gait belt    Activity Tolerance Patient tolerated treatment well    Behavior During Therapy WFL for tasks assessed/performed                         Past Medical History:  Diagnosis Date   ADHD    Arthritis    Concussion    COVID 12/2020   Hypertension    Multiple lacunar infarcts (HCC)    MVP (mitral valve prolapse)    Pernicious anemia    [place] spotted fever    Spinal stenosis    Vestibular migraine    Vitamin B12 deficiency    Vitamin D deficiency    Past Surgical History:  Procedure Laterality Date   ANTERIOR CERVICAL DECOMP/DISCECTOMY FUSION N/A 07/30/2022   Procedure: C4-7 ANTERIOR CERVICAL DISCECTOMY AND FUSION (GLOBUS HEDRON);  Surgeon: Venetia Night, MD;  Location: [facility];  Service: Neurosurgery;  Laterality: N/A;   BILATERAL CARPAL TUNNEL RELEASE     CERVICAL WOUND DEBRIDEMENT N/A 08/01/2022   Procedure: CERVICAL WOUND DEBRIDEMENT OF HEMATOMA;  Surgeon: Venetia Night, MD;  Location: [facility];  Service: Neurosurgery;  Laterality: N/A;   FRACTURE SURGERY Left 1984   intramedullary rod   hip bone transplant  1984   LEG SURGERY Left    oseotomy   REPAIR ANKLE LIGAMENT Right 1978   REPAIR KNEE LIGAMENT Bilateral    acl   TRIGGER FINGER RELEASE Bilateral    thumbs   Patient Active Problem List   Diagnosis Date Noted   Postoperative hematoma of musculoskeletal structure following musculoskeletal procedure 08/01/2022    Cervical myelopathy (HCC) 07/30/2022   Cervical spinal stenosis 07/30/2022   Radiculopathy, cervical 07/30/2022   S/P cervical spinal fusion 07/30/2022   Vitamin D deficiency 06/25/2022   B12 deficiency 06/25/2022   Elevated LDL cholesterol level 06/25/2022   Prediabetes 03/27/2021   History of nausea- reported 01/27/2020 02/02/2020   Accidental fall from ladder 02/02/2020   Acute right-sided thoracic back pain 02/02/2020   Neck stiffness 02/02/2020   Concussion with no loss of consciousness 02/02/2020   Multiple falls 02/02/2020   Head trauma, initial encounter 02/02/2020   Acute post-traumatic headache, not intractable 02/02/2020   Essential hypertension 08/26/2018   Pre-bariatric surgery nutrition evaluation 06/19/2018   Migraine with vertigo 01/05/2014   Arthralgia of multiple joints 04/28/2009   Carpal tunnel syndrome 08/30/2008    ONSET DATE: summer 2022  REFERRING DIAG: LE weakness and gait instability   THERAPY DIAG:  Muscle weakness (generalized)  Unsteadiness on feet  Difficulty in walking, not elsewhere classified  Rationale for Evaluation and Treatment: Rehabilitation  SUBJECTIVE:  SUBJECTIVE STATEMENT: Patient reports he doesn't remember falling since last session. Has been doing exercising.  Pt accompanied by: self  PERTINENT HISTORY:  Patient presents for LE weakness and gait instability. He is s/p ACDF C-7 on 07/30/22. Patient has a leg length discrepancy and limited L knee flexion since his 20's. Patient has a lot of atrophy and lower back pain, foot drop bilaterally.  First symptoms was stumbling up stairs, with LLE. Foot drop was improved after ACDF. Now has numbness and tingling at the end of the day. Does have a few lesions in his pons per wife, on blood pressure  medication now. PMH includes ADHD, arthritis, concussion COVID, HTN, multiple lacunar infarcts, MVP, pernicious anemia, rocky mountain spotted fever, spinal stenosis, vestibular migraine, vitamin B deficiency.   PAIN:  Are you having pain?  Denies pain   PRECAUTIONS: Other: cervical; no lifting more than 20 lb   WEIGHT BEARING RESTRICTIONS: No  FALLS: Has patient fallen in last 6 months? Yes. Number of falls multiple a week  LIVING ENVIRONMENT: Lives with: lives with their family Lives in: House/apartment Stairs: Yes: Internal: flight steps; is sleeping downstairs Has following equipment at home: Single point cane  PLOF: Independent, uses a walking stick  PATIENT GOALS: gait control of feet, strengthen legs, balance, atrophy in hand  OBJECTIVE:   DIAGNOSTIC FINDINGS:  09/11/22: No acute fracture. No spondylolisthesis. No compression deformities. Disc space narrowing C3-4 with marginal osteophytes consistent with degenerative disc disease. Postop changes ACDF C4-C7 with anatomic alignment. Normal prevertebral and cervicocranial soft tissues.   06/19/22: 1. L5 chronic pars defects with L5-S1 anterolisthesis and advanced disc degeneration causing marked biforaminal L5 impingement. 2. Degeneration and short pedicles causes compressive spinal stenosis from L1-2 to L3-4, most advanced at L2-3. Moderate bilateral foraminal narrowing at the same levels.   COGNITION: Overall cognitive status: Within functional limits for tasks assessed    COORDINATION: Limited LLE  MUSCLE LENGTH: Significant limitation L hamstring; 60 degree flexion deficit.   POSTURE:  weight shift to the R  LOWER EXTREMITY ROM:     L knee flexion contraction -60 contraction LOWER EXTREMITY MMT:    MMT Right Eval R 5/15 Left Eval L 5/15  Hip flexion 4 5 4  4+  Hip extension      Hip abduction 4- 4 4- 4  Hip adduction 4- 4 4- 4-  Hip internal rotation      Hip external rotation      Knee flexion 4-  4 3 4-  Knee extension 4 4+ 4 4  Ankle dorsiflexion 3 3 2+ 2+  Ankle plantarflexion 3 3+ 3 3+  Ankle inversion      Ankle eversion      (Blank rows = not tested)  01/02/23 Right Left  Shoulder Flexion 4 4-  Shoulder Abduction 4- 4-  Shoulder Extension 4- 4-  Shoulder adduction 4- 3+  Bicep  4 4  Triceps 4- 3+      BED MOBILITY:  Perform next session  TRANSFERS: Assistive device utilized: Single point cane  Sit to stand: CGA Stand to sit: CGA Chair to chair: CGA   GAIT: Gait pattern: L knee limited extension, L foot drop, L foot eversion Distance walked: 1200 Assistive device utilized: Single point cane Level of assistance: CGA Comments: Patient has increased eversion and rolling of L foot, LLE leg length discrepancy.   FUNCTIONAL TESTS:  5 times sit to stand: 5.6 seconds with minor use of LLE no UE support Timed up and  go (TUG): n/a 6 minute walk test: 1200 ft 10 meter walk test: 7 seconds BERG: 36/56   PATIENT SURVEYS:  FOTO 54  TODAY'S TREATMENT:                                                                                                                              DATE: 02/05/23  Neuro Re-ed:  -on airex pad modified tandem with dynadisc: dual task with word game x 4 minutes L foot forward, 3:46  minutes RLE forward     TherEx:  Standing  -hip extension with disc slide ; 15x each side cue for upright posture  Seated: Marble transfer: L foot to right; x5; R foot to L x5 ; x 2 sets  -eversion/inversion with towel 10x  each side; x2 sets  -df/pf 20x  -slider hamstring curl 15x   PATIENT EDUCATION: Education details: goals, POC, educated on safety during updated exercises  Person educated: Patient and Spouse Education method: Explanation, Demonstration, Tactile cues, and Verbal cues Education comprehension: verbalized understanding, returned demonstration, verbal cues required, and tactile cues required  HOME EXERCISE PROGRAM: Access Code:  [email or web address] URL: [email or web address] Date: 10/11/2022 Prepared by: Precious Bard  Exercises - Seated Ankle Alphabet  - 1 x daily - 7 x weekly - 2 sets - 10 reps - 5 hold - Seated Heel Toe Raises  - 1 x daily - 7 x weekly - 2 sets - 10 reps - 5 hold - Standing March with Counter Support  - 1 x daily - 7 x weekly - 2 sets - 10 reps - 5 hold  Access Code: [email or web address] URL: [email or web address] Date: 10/30/2022 Prepared by: Precious Bard  Exercises - Seated Ankle Plantarflexion with Resistance  - 1 x daily - 7 x weekly - 2 sets - 10 reps - 5 hold - Seated Ankle Eversion with Resistance  - 1 x daily - 7 x weekly - 2 sets - 10 reps - 5 hold  Access Code: [email or web address] URL: [email or web address] Date: 11/14/2022 Prepared by: Precious Bard  Exercises - Seated Heel Toe Raises  - 1 x daily - 7 x weekly - 2 sets - 10 reps - 5 hold - Seated Hip Internal Rotation AROM  - 1 x daily - 7 x weekly - 2 sets - 10 reps - 5 hold - Bilateral Long Arc Quad  - 1 x daily - 7 x weekly - 2 sets - 10 reps - 5 hold - Foam Balance Tandem stance  - 1 x daily - 7 x weekly - 2 sets - 2 reps - 30 hold - Single Leg Balance on Foam  - 1 x daily - 7 x weekly - 2 sets - 30 reps - 2 hold - Seated Hip Flexion March with Ankle Weights  - 1 x daily - 7 x weekly - 2 sets - 10 reps - 5 hold - Standing Hip Abduction with Ankle Weight  - 1 x daily - 7  x weekly - 2 sets - 10 reps - 5 hold - Standing Hip Extension with Ankle Weight  - 1 x daily - 7 x weekly - 2 sets - 10 reps - 5 hold - Standing Hip Flexion with Ankle Weight  - 1 x daily - 7 x weekly - 2 sets - 10 reps - 5 hold - Seated Long Arc Quad with Ankle Weight  - 1 x daily - 7 x weekly - 2 sets - 10 reps - 5 hold   GOALS: Goals reviewed with patient? Yes  SHORT TERM GOALS: Target date:01/30/2023      Patient will be independent in home exercise program to improve strength/mobility for better functional independence with  ADLs. Baseline: 2/21: HEP give next session 5/15 : intermittent compliance  Goal status: progress    LONG TERM GOALS: Target date: 03/27/2023      Patient will increase FOTO score to equal to or greater than  64%   to demonstrate statistically significant improvement in mobility and quality of life.  Baseline: 2/21: 54% Goal status: INITIAL  2.  Patient will increase Berg Balance score by > 6 points (42/56)to demonstrate decreased fall risk during functional activities. Baseline: 2/21: 36/56 5/15: 45  Goal status: MET  3.  Patient will increase six minute walk test distance to >1500 for progression to age norm community ambulator and improve gait ability Baseline: 2/21: 1200 ft with [facility] 5/15: 1300 ft with [facility]  Goal status: In progress  4.  Patient will increase BLE gross strength to 4+/5 as to improve functional strength for independent gait, increased standing tolerance and increased ADL ability. Baseline: 2/21 :see above 5/15: see above  Goal status:  In progress   6.  Patient will increase Berg Balance score by > 6 points (51/56) to demonstrate decreased fall risk during functional activities. Baseline:  5/15: 45  Goal status: NEW 7.   Patient will increase UE strength to >4+/5 for ADLs, AD usage, and quality of life.  Baseline: 5/15: see above  Goal status: NEW   ASSESSMENT:  CLINICAL IMPRESSION: Patient session limited by late arrival. Education on stabilization techniques for ankle righting reactions.  Patient is challenged with tandem stance on airex pad and dynadisc with limited ankle righting reactions of LLE. Head turn stabilization is improved this session. L foot intrinsic muscles are challenged with marble transferPatient will benefit from skilled physical therapy to improve strength, mobility, and stability for improved quality of life.   OBJECTIVE IMPAIRMENTS: Abnormal gait, decreased activity tolerance, decreased balance, decreased coordination, decreased  endurance, decreased mobility, difficulty walking, decreased ROM, decreased strength, hypomobility, impaired flexibility, improper body mechanics, and postural dysfunction.   ACTIVITY LIMITATIONS: carrying, lifting, bending, standing, squatting, stairs, transfers, reach over head, locomotion level, and caring for others  PARTICIPATION LIMITATIONS: meal prep, cleaning, laundry, personal finances, driving, shopping, community activity, and yard work  PERSONAL FACTORS: Age, Past/current experiences, Time since onset of injury/illness/exacerbation, and 3+ comorbidities: DHD, arthritis, concussion COVID, HTN, multiple lacunar infarcts, MVP, pernicious anemia, rocky mountain spotted fever, spinal stenosis, vestibular migraine, vitamin B deficiency.   are also affecting patient's functional outcome.   REHAB POTENTIAL: Good  CLINICAL DECISION MAKING: Evolving/moderate complexity  EVALUATION COMPLEXITY: Moderate  PLAN:   PT FREQUENCY: 1x/week  PT DURATION: 12 weeks  PLANNED INTERVENTIONS: Therapeutic exercises, Therapeutic activity, Neuromuscular re-education, Balance training, Gait training, Patient/Family education, Self Care, Joint mobilization, Stair training, Vestibular training, Canalith repositioning, Visual/preceptual remediation/compensation, Orthotic/Fit training, DME instructions, Cognitive remediation, Electrical stimulation, Spinal  mobilization, Cryotherapy, Moist heat, Splintting, Taping, Traction, Ultrasound, Manual therapy, and Re-evaluation  PLAN FOR NEXT SESSION:  Continue to progress UE strength w/ use of DB (pt has them at home) and balance for decreasing risk of falls.     Precious Bard, PT, DPT Physical Therapist - [facility]  Outpatient Physical Therapy- [facility] [phone]

## 2023-02-05 ENCOUNTER — Ambulatory Visit: Payer: 59

## 2023-02-05 DIAGNOSIS — M6281 Muscle weakness (generalized): Secondary | ICD-10-CM | POA: Diagnosis not present

## 2023-02-05 DIAGNOSIS — R262 Difficulty in walking, not elsewhere classified: Secondary | ICD-10-CM | POA: Diagnosis not present

## 2023-02-05 DIAGNOSIS — R2681 Unsteadiness on feet: Secondary | ICD-10-CM | POA: Diagnosis not present

## 2023-02-06 ENCOUNTER — Encounter: Payer: 59 | Attending: Psychology | Admitting: Psychology

## 2023-02-06 DIAGNOSIS — R413 Other amnesia: Secondary | ICD-10-CM | POA: Insufficient documentation

## 2023-02-06 DIAGNOSIS — G3184 Mild cognitive impairment, so stated: Secondary | ICD-10-CM | POA: Insufficient documentation

## 2023-02-06 DIAGNOSIS — R41840 Attention and concentration deficit: Secondary | ICD-10-CM | POA: Insufficient documentation

## 2023-02-06 DIAGNOSIS — F840 Autistic disorder: Secondary | ICD-10-CM | POA: Insufficient documentation

## 2023-02-10 ENCOUNTER — Encounter: Payer: Self-pay | Admitting: Psychology
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
## 2023-02-11 NOTE — Therapy (Signed)
OUTPATIENT PHYSICAL THERAPY NEURO TREATMENT   Patient Name: [name] MRN: [id number] DOB:1961-02-05, 62 y.o., male Today's Date: 02/12/2023   PCP: Larae Grooms NP REFERRING PROVIDER: Susanne Borders PA  END OF SESSION:  PT End of Session - 02/12/23 1307     Visit Number 15    Number of Visits 22    Date for PT Re-Evaluation 03/27/23    Authorization Type eval 2/21    PT Start Time 1307    PT Stop Time 1345    PT Time Calculation (min) 38 min    Equipment Utilized During Treatment Gait belt    Activity Tolerance Patient tolerated treatment well    Behavior During Therapy [facility] for tasks assessed/performed                          Past Medical History:  Diagnosis Date   ADHD    Arthritis    Concussion    COVID 12/2020   Hypertension    Multiple lacunar infarcts (HCC)    MVP (mitral valve prolapse)    Pernicious anemia    [facility] spotted fever    Spinal stenosis    Vestibular migraine    Vitamin B12 deficiency    Vitamin D deficiency    Past Surgical History:  Procedure Laterality Date   ANTERIOR CERVICAL DECOMP/DISCECTOMY FUSION N/A 07/30/2022   Procedure: C4-7 ANTERIOR CERVICAL DISCECTOMY AND FUSION (GLOBUS HEDRON);  Surgeon: Venetia Night, MD;  Location: [facility] ORS;  Service: Neurosurgery;  Laterality: N/A;   BILATERAL CARPAL TUNNEL RELEASE     CERVICAL WOUND DEBRIDEMENT N/A 08/01/2022   Procedure: CERVICAL WOUND DEBRIDEMENT OF HEMATOMA;  Surgeon: Venetia Night, MD;  Location: [facility] ORS;  Service: Neurosurgery;  Laterality: N/A;   FRACTURE SURGERY Left 1984   intramedullary rod   hip bone transplant  1984   LEG SURGERY Left    oseotomy   REPAIR ANKLE LIGAMENT Right 1978   REPAIR KNEE LIGAMENT Bilateral    acl   TRIGGER FINGER RELEASE Bilateral    thumbs   Patient Active Problem List   Diagnosis Date Noted   Postoperative hematoma of musculoskeletal structure following musculoskeletal procedure 08/01/2022    Cervical myelopathy (HCC) 07/30/2022   Cervical spinal stenosis 07/30/2022   Radiculopathy, cervical 07/30/2022   S/P cervical spinal fusion 07/30/2022   Vitamin D deficiency 06/25/2022   B12 deficiency 06/25/2022   Elevated LDL cholesterol level 06/25/2022   Prediabetes 03/27/2021   History of nausea- reported 01/27/2020 02/02/2020   Accidental fall from ladder 02/02/2020   Acute right-sided thoracic back pain 02/02/2020   Neck stiffness 02/02/2020   Concussion with no loss of consciousness 02/02/2020   Multiple falls 02/02/2020   Head trauma, initial encounter 02/02/2020   Acute post-traumatic headache, not intractable 02/02/2020   Essential hypertension 08/26/2018   Pre-bariatric surgery nutrition evaluation 06/19/2018   Migraine with vertigo 01/05/2014   Arthralgia of multiple joints 04/28/2009   Carpal tunnel syndrome 08/30/2008    ONSET DATE: summer 2022  REFERRING DIAG: LE weakness and gait instability   THERAPY DIAG:  Muscle weakness (generalized)  Unsteadiness on feet  Difficulty in walking, not elsewhere classified  Rationale for Evaluation and Treatment: Rehabilitation  SUBJECTIVE:  SUBJECTIVE STATEMENT: Patient reports multiple stumbles but no falls.   Pt accompanied by: self  PERTINENT HISTORY:  Patient presents for LE weakness and gait instability. He is s/p ACDF C-7 on 07/30/22. Patient has a leg length discrepancy and limited L knee flexion since his 20's. Patient has a lot of atrophy and lower back pain, foot drop bilaterally.  First symptoms was stumbling up stairs, with LLE. Foot drop was improved after ACDF. Now has numbness and tingling at the end of the day. Does have a few lesions in his pons per wife, on blood pressure medication now. PMH includes ADHD, arthritis,  concussion COVID, HTN, multiple lacunar infarcts, MVP, pernicious anemia, rocky mountain spotted fever, spinal stenosis, vestibular migraine, vitamin B deficiency.   PAIN:  Are you having pain?  Denies pain   PRECAUTIONS: Other: cervical; no lifting more than 20 lb   WEIGHT BEARING RESTRICTIONS: No  FALLS: Has patient fallen in last 6 months? Yes. Number of falls multiple a week  LIVING ENVIRONMENT: Lives with: lives with their family Lives in: House/apartment Stairs: Yes: Internal: flight steps; is sleeping downstairs Has following equipment at home: Single point cane  PLOF: Independent, uses a walking stick  PATIENT GOALS: gait control of feet, strengthen legs, balance, atrophy in hand  OBJECTIVE:   DIAGNOSTIC FINDINGS:  09/11/22: No acute fracture. No spondylolisthesis. No compression deformities. Disc space narrowing C3-4 with marginal osteophytes consistent with degenerative disc disease. Postop changes ACDF C4-C7 with anatomic alignment. Normal prevertebral and cervicocranial soft tissues.   06/19/22: 1. L5 chronic pars defects with L5-S1 anterolisthesis and advanced disc degeneration causing marked biforaminal L5 impingement. 2. Degeneration and short pedicles causes compressive spinal stenosis from L1-2 to L3-4, most advanced at L2-3. Moderate bilateral foraminal narrowing at the same levels.   COGNITION: Overall cognitive status: Within functional limits for tasks assessed    COORDINATION: Limited LLE  MUSCLE LENGTH: Significant limitation L hamstring; 60 degree flexion deficit.   POSTURE:  weight shift to the R  LOWER EXTREMITY ROM:     L knee flexion contraction -60 contraction LOWER EXTREMITY MMT:    MMT Right Eval R 5/15 Left Eval L 5/15  Hip flexion 4 5 4  4+  Hip extension      Hip abduction 4- 4 4- 4  Hip adduction 4- 4 4- 4-  Hip internal rotation      Hip external rotation      Knee flexion 4- 4 3 4-  Knee extension 4 4+ 4 4  Ankle  dorsiflexion 3 3 2+ 2+  Ankle plantarflexion 3 3+ 3 3+  Ankle inversion      Ankle eversion      (Blank rows = not tested)  01/02/23 Right Left  Shoulder Flexion 4 4-  Shoulder Abduction 4- 4-  Shoulder Extension 4- 4-  Shoulder adduction 4- 3+  Bicep  4 4  Triceps 4- 3+      BED MOBILITY:  Perform next session  TRANSFERS: Assistive device utilized: Single point cane  Sit to stand: CGA Stand to sit: CGA Chair to chair: CGA   GAIT: Gait pattern: L knee limited extension, L foot drop, L foot eversion Distance walked: 1200 Assistive device utilized: Single point cane Level of assistance: CGA Comments: Patient has increased eversion and rolling of L foot, LLE leg length discrepancy.   FUNCTIONAL TESTS:  5 times sit to stand: 5.6 seconds with minor use of LLE no UE support Timed up and go (TUG): n/a 6 minute  walk test: 1200 ft 10 meter walk test: 7 seconds BERG: 36/56   PATIENT SURVEYS:  FOTO 54  TODAY'S TREATMENT:                                                                                                                              DATE: 02/12/23     TherEx:  Long sitting: Df/pf 20x Pulse width: 250, frequency 115 hz L exensors with active df/pf during cycle on: 1 min off 30 seconds off. 4 trials   Prone:  Hamstring curl 10x each side; x2 sets Prone press up for hip flexor stretch 30 seconds Hip extension 10x each leg  Standing: Modified sit to stand with LLE on raised table 10x with RLE toe touch for weight shift to LLE.   PATIENT EDUCATION: Education details: goals, POC, educated on safety during updated exercises  Person educated: Patient and Spouse Education method: Explanation, Demonstration, Tactile cues, and Verbal cues Education comprehension: verbalized understanding, returned demonstration, verbal cues required, and tactile cues required  HOME EXERCISE PROGRAM: Access Code: [email or web address] URL: [email or web address] Date:  10/11/2022 Prepared by: Precious Bard  Exercises - Seated Ankle Alphabet  - 1 x daily - 7 x weekly - 2 sets - 10 reps - 5 hold - Seated Heel Toe Raises  - 1 x daily - 7 x weekly - 2 sets - 10 reps - 5 hold - Standing March with Counter Support  - 1 x daily - 7 x weekly - 2 sets - 10 reps - 5 hold  Access Code: [email or web address] URL: [email or web address] Date: 10/30/2022 Prepared by: Precious Bard  Exercises - Seated Ankle Plantarflexion with Resistance  - 1 x daily - 7 x weekly - 2 sets - 10 reps - 5 hold - Seated Ankle Eversion with Resistance  - 1 x daily - 7 x weekly - 2 sets - 10 reps - 5 hold  Access Code: [email or web address] URL: [email or web address] Date: 11/14/2022 Prepared by: Precious Bard  Exercises - Seated Heel Toe Raises  - 1 x daily - 7 x weekly - 2 sets - 10 reps - 5 hold - Seated Hip Internal Rotation AROM  - 1 x daily - 7 x weekly - 2 sets - 10 reps - 5 hold - Bilateral Long Arc Quad  - 1 x daily - 7 x weekly - 2 sets - 10 reps - 5 hold - Foam Balance Tandem stance  - 1 x daily - 7 x weekly - 2 sets - 2 reps - 30 hold - Single Leg Balance on Foam  - 1 x daily - 7 x weekly - 2 sets - 30 reps - 2 hold - Seated Hip Flexion March with Ankle Weights  - 1 x daily - 7 x weekly - 2 sets - 10 reps - 5 hold - Standing Hip Abduction with Ankle Weight  - 1 x daily - 7 x weekly - 2 sets - 10 reps - 5 hold -  Standing Hip Extension with Ankle Weight  - 1 x daily - 7 x weekly - 2 sets - 10 reps - 5 hold - Standing Hip Flexion with Ankle Weight  - 1 x daily - 7 x weekly - 2 sets - 10 reps - 5 hold - Seated Long Arc Quad with Ankle Weight  - 1 x daily - 7 x weekly - 2 sets - 10 reps - 5 hold   GOALS: Goals reviewed with patient? Yes  SHORT TERM GOALS: Target date:01/30/2023      Patient will be independent in home exercise program to improve strength/mobility for better functional independence with ADLs. Baseline: 2/21: HEP give next session 5/15 : intermittent  compliance  Goal status: progress    LONG TERM GOALS: Target date: 03/27/2023      Patient will increase FOTO score to equal to or greater than  64%   to demonstrate statistically significant improvement in mobility and quality of life.  Baseline: 2/21: 54% Goal status: INITIAL  2.  Patient will increase Berg Balance score by > 6 points (42/56)to demonstrate decreased fall risk during functional activities. Baseline: 2/21: 36/56 5/15: 45  Goal status: MET  3.  Patient will increase six minute walk test distance to >1500 for progression to age norm community ambulator and improve gait ability Baseline: 2/21: 1200 ft with [facility] 5/15: 1300 ft with [facility]  Goal status: In progress  4.  Patient will increase BLE gross strength to 4+/5 as to improve functional strength for independent gait, increased standing tolerance and increased ADL ability. Baseline: 2/21 :see above 5/15: see above  Goal status:  In progress   6.  Patient will increase Berg Balance score by > 6 points (51/56) to demonstrate decreased fall risk during functional activities. Baseline:  5/15: 45  Goal status: NEW 7.   Patient will increase UE strength to >4+/5 for ADLs, AD usage, and quality of life.  Baseline: 5/15: see above  Goal status: NEW   ASSESSMENT:  CLINICAL IMPRESSION: Patient has significant hamstring weakness as can be seen in prone position. He has back extensor compensation with L hamstring curl.  Patient tolerates df/pf in non gravity position. Focus on active dorsiflexion performed throughout session with patient reporting fatigue by end of session.  Patient will benefit from skilled physical therapy to improve strength, mobility, and stability for improved quality of life.   OBJECTIVE IMPAIRMENTS: Abnormal gait, decreased activity tolerance, decreased balance, decreased coordination, decreased endurance, decreased mobility, difficulty walking, decreased ROM, decreased strength, hypomobility, impaired  flexibility, improper body mechanics, and postural dysfunction.   ACTIVITY LIMITATIONS: carrying, lifting, bending, standing, squatting, stairs, transfers, reach over head, locomotion level, and caring for others  PARTICIPATION LIMITATIONS: meal prep, cleaning, laundry, personal finances, driving, shopping, community activity, and yard work  PERSONAL FACTORS: Age, Past/current experiences, Time since onset of injury/illness/exacerbation, and 3+ comorbidities: DHD, arthritis, concussion COVID, HTN, multiple lacunar infarcts, MVP, pernicious anemia, rocky mountain spotted fever, spinal stenosis, vestibular migraine, vitamin B deficiency.   are also affecting patient's functional outcome.   REHAB POTENTIAL: Good  CLINICAL DECISION MAKING: Evolving/moderate complexity  EVALUATION COMPLEXITY: Moderate  PLAN:   PT FREQUENCY: 1x/week  PT DURATION: 12 weeks  PLANNED INTERVENTIONS: Therapeutic exercises, Therapeutic activity, Neuromuscular re-education, Balance training, Gait training, Patient/Family education, Self Care, Joint mobilization, Stair training, Vestibular training, Canalith repositioning, Visual/preceptual remediation/compensation, Orthotic/Fit training, DME instructions, Cognitive remediation, Electrical stimulation, Spinal mobilization, Cryotherapy, Moist heat, Splintting, Taping, Traction, Ultrasound, Manual therapy, and Re-evaluation  PLAN FOR  NEXT SESSION:  Continue to progress UE strength w/ use of DB (pt has them at home) and balance for decreasing risk of falls.     Precious Bard, PT, DPT Physical Therapist - [facility]  Outpatient Physical Therapy- Main Campus [phone]

## 2023-02-12 ENCOUNTER — Ambulatory Visit: Payer: 59

## 2023-02-12 DIAGNOSIS — M6281 Muscle weakness (generalized): Secondary | ICD-10-CM

## 2023-02-12 DIAGNOSIS — R262 Difficulty in walking, not elsewhere classified: Secondary | ICD-10-CM | POA: Diagnosis not present

## 2023-02-12 DIAGNOSIS — R2681 Unsteadiness on feet: Secondary | ICD-10-CM

## 2023-02-19 ENCOUNTER — Other Ambulatory Visit: Payer: Self-pay | Admitting: Nurse Practitioner

## 2023-02-20 NOTE — Telephone Encounter (Signed)
Requested Prescriptions  Pending Prescriptions Disp Refills   lisinopril (ZESTRIL) 20 MG tablet [Pharmacy Med Name: LISINOPRIL 20 MG TABLET] 90 tablet 0    Sig: TAKE 1 TABLET BY MOUTH EVERY DAY     Cardiovascular:  ACE Inhibitors Passed - 02/19/2023  5:42 PM      Passed - Cr in normal range and within 180 days    Creatinine  Date Value Ref Range Status  11/13/2011 0.70 0.60 - 1.30 mg/dL Final   Creatinine, Ser  Date Value Ref Range Status  01/08/2023 0.99 0.76 - 1.27 mg/dL Final         Passed - K in normal range and within 180 days    Potassium  Date Value Ref Range Status  01/08/2023 4.7 3.5 - 5.2 mmol/L Final  11/13/2011 4.1 3.5 - 5.1 mmol/L Final         Passed - Patient is not pregnant      Passed - Last BP in normal range    BP Readings from Last 1 Encounters:  01/08/23 122/78         Passed - Valid encounter within last 6 months    Recent Outpatient Visits           1 month ago Essential hypertension   [place] [facility] Larae Grooms, NP   2 months ago Viral upper respiratory tract infection   [place] [facility] Larae Grooms, NP   3 months ago Essential hypertension   [place] [facility] Larae Grooms, NP   4 months ago Essential hypertension   [place] [facility] Larae Grooms, NP   6 months ago Essential hypertension   [place] [facility] Larae Grooms, NP       Future Appointments             In 4 months Larae Grooms, NP [place] [facility]

## 2023-02-26 ENCOUNTER — Ambulatory Visit: Payer: 59 | Attending: Neurosurgery

## 2023-02-26 DIAGNOSIS — M6281 Muscle weakness (generalized): Secondary | ICD-10-CM | POA: Diagnosis not present

## 2023-02-26 DIAGNOSIS — R262 Difficulty in walking, not elsewhere classified: Secondary | ICD-10-CM | POA: Diagnosis not present

## 2023-02-26 DIAGNOSIS — R2681 Unsteadiness on feet: Secondary | ICD-10-CM | POA: Diagnosis not present

## 2023-02-26 NOTE — Therapy (Signed)
OUTPATIENT PHYSICAL THERAPY NEURO TREATMENT   Patient Name: [name] MRN: [id number] DOB:04/11/1961, 62 y.o., male Today's Date: 02/26/2023   PCP: Larae Grooms NP REFERRING PROVIDER: Susanne Borders PA  END OF SESSION:  PT End of Session - 02/26/23 1401     Visit Number 16    Number of Visits 22    Date for PT Re-Evaluation 03/27/23    Authorization Type eval 2/21    PT Start Time 1402    PT Stop Time 1443    PT Time Calculation (min) 41 min    Equipment Utilized During Treatment Gait belt    Activity Tolerance Patient tolerated treatment well    Behavior During Therapy [facility] for tasks assessed/performed                          Past Medical History:  Diagnosis Date   ADHD    Arthritis    Concussion    COVID 12/2020   Hypertension    Multiple lacunar infarcts (HCC)    MVP (mitral valve prolapse)    Pernicious anemia    [facility] spotted fever    Spinal stenosis    Vestibular migraine    Vitamin B12 deficiency    Vitamin D deficiency    Past Surgical History:  Procedure Laterality Date   ANTERIOR CERVICAL DECOMP/DISCECTOMY FUSION N/A 07/30/2022   Procedure: C4-7 ANTERIOR CERVICAL DISCECTOMY AND FUSION (GLOBUS HEDRON);  Surgeon: Venetia Night, MD;  Location: [facility] ORS;  Service: Neurosurgery;  Laterality: N/A;   BILATERAL CARPAL TUNNEL RELEASE     CERVICAL WOUND DEBRIDEMENT N/A 08/01/2022   Procedure: CERVICAL WOUND DEBRIDEMENT OF HEMATOMA;  Surgeon: Venetia Night, MD;  Location: [facility] ORS;  Service: Neurosurgery;  Laterality: N/A;   FRACTURE SURGERY Left 1984   intramedullary rod   hip bone transplant  1984   LEG SURGERY Left    oseotomy   REPAIR ANKLE LIGAMENT Right 1978   REPAIR KNEE LIGAMENT Bilateral    acl   TRIGGER FINGER RELEASE Bilateral    thumbs   Patient Active Problem List   Diagnosis Date Noted   Postoperative hematoma of musculoskeletal structure following musculoskeletal procedure 08/01/2022    Cervical myelopathy (HCC) 07/30/2022   Cervical spinal stenosis 07/30/2022   Radiculopathy, cervical 07/30/2022   S/P cervical spinal fusion 07/30/2022   Vitamin D deficiency 06/25/2022   B12 deficiency 06/25/2022   Elevated LDL cholesterol level 06/25/2022   Prediabetes 03/27/2021   History of nausea- reported 01/27/2020 02/02/2020   Accidental fall from ladder 02/02/2020   Acute right-sided thoracic back pain 02/02/2020   Neck stiffness 02/02/2020   Concussion with no loss of consciousness 02/02/2020   Multiple falls 02/02/2020   Head trauma, initial encounter 02/02/2020   Acute post-traumatic headache, not intractable 02/02/2020   Essential hypertension 08/26/2018   Pre-bariatric surgery nutrition evaluation 06/19/2018   Migraine with vertigo 01/05/2014   Arthralgia of multiple joints 04/28/2009   Carpal tunnel syndrome 08/30/2008    ONSET DATE: summer 2022  REFERRING DIAG: LE weakness and gait instability   THERAPY DIAG:  Muscle weakness (generalized)  Unsteadiness on feet  Difficulty in walking, not elsewhere classified  Rationale for Evaluation and Treatment: Rehabilitation  SUBJECTIVE:  SUBJECTIVE STATEMENT: Pt reports he had one fall from his feet getting crossed, and he started to fall down hill and fell onto an asphalt. Denies any cuts but states he "torqued everything badly".    Pt accompanied by: self  PERTINENT HISTORY:  Patient presents for LE weakness and gait instability. He is s/p ACDF C-7 on 07/30/22. Patient has a leg length discrepancy and limited L knee flexion since his 20's. Patient has a lot of atrophy and lower back pain, foot drop bilaterally.  First symptoms was stumbling up stairs, with LLE. Foot drop was improved after ACDF. Now has numbness and tingling at the  end of the day. Does have a few lesions in his pons per wife, on blood pressure medication now. PMH includes ADHD, arthritis, concussion COVID, HTN, multiple lacunar infarcts, MVP, pernicious anemia, rocky mountain spotted fever, spinal stenosis, vestibular migraine, vitamin B deficiency.   PAIN:  Are you having pain?  Denies pain   PRECAUTIONS: Other: cervical; no lifting more than 20 lb   WEIGHT BEARING RESTRICTIONS: No  FALLS: Has patient fallen in last 6 months? Yes. Number of falls multiple a week  LIVING ENVIRONMENT: Lives with: lives with their family Lives in: House/apartment Stairs: Yes: Internal: flight steps; is sleeping downstairs Has following equipment at home: Single point cane  PLOF: Independent, uses a walking stick  PATIENT GOALS: gait control of feet, strengthen legs, balance, atrophy in hand  OBJECTIVE:   DIAGNOSTIC FINDINGS:  09/11/22: No acute fracture. No spondylolisthesis. No compression deformities. Disc space narrowing C3-4 with marginal osteophytes consistent with degenerative disc disease. Postop changes ACDF C4-C7 with anatomic alignment. Normal prevertebral and cervicocranial soft tissues.   06/19/22: 1. L5 chronic pars defects with L5-S1 anterolisthesis and advanced disc degeneration causing marked biforaminal L5 impingement. 2. Degeneration and short pedicles causes compressive spinal stenosis from L1-2 to L3-4, most advanced at L2-3. Moderate bilateral foraminal narrowing at the same levels.   COGNITION: Overall cognitive status: Within functional limits for tasks assessed    COORDINATION: Limited LLE  MUSCLE LENGTH: Significant limitation L hamstring; 60 degree flexion deficit.   POSTURE:  weight shift to the R  LOWER EXTREMITY ROM:     L knee flexion contraction -60 contraction LOWER EXTREMITY MMT:    MMT Right Eval R 5/15 Left Eval L 5/15  Hip flexion 4 5 4  4+  Hip extension      Hip abduction 4- 4 4- 4  Hip adduction 4- 4  4- 4-  Hip internal rotation      Hip external rotation      Knee flexion 4- 4 3 4-  Knee extension 4 4+ 4 4  Ankle dorsiflexion 3 3 2+ 2+  Ankle plantarflexion 3 3+ 3 3+  Ankle inversion      Ankle eversion      (Blank rows = not tested)  01/02/23 Right Left  Shoulder Flexion 4 4-  Shoulder Abduction 4- 4-  Shoulder Extension 4- 4-  Shoulder adduction 4- 3+  Bicep  4 4  Triceps 4- 3+      BED MOBILITY:  Perform next session  TRANSFERS: Assistive device utilized: Single point cane  Sit to stand: CGA Stand to sit: CGA Chair to chair: CGA   GAIT: Gait pattern: L knee limited extension, L foot drop, L foot eversion Distance walked: 1200 Assistive device utilized: Single point cane Level of assistance: CGA Comments: Patient has increased eversion and rolling of L foot, LLE leg length discrepancy.  FUNCTIONAL TESTS:  5 times sit to stand: 5.6 seconds with minor use of LLE no UE support Timed up and go (TUG): n/a 6 minute walk test: 1200 ft 10 meter walk test: 7 seconds BERG: 36/56   PATIENT SURVEYS:  FOTO 54  TODAY'S TREATMENT:                                                                                                                              DATE: 02/26/23 Unless otherwise stated, CGA was provided and gait belt donned in order to ensure pt safety  TherEx: (8 min) -2 x 10:  seated calf raises, added 2# for resistance, pt reported difficult  -2 x 10 x 3 sec hold B: seated marches with 2# AW, pt reported medium. Cued to scoot to edge of chair to avoid L hip impingement  NMRE: (15 min)  -standing on Airex pad re:  -B tandem, one-two finger support  -eyes closed, eyes open -tandem walking on 2" by 4" in // bars with min RUE support x 2  -tandem walking on line x 10 laps, 5 w/o cane and 5 w/ cane. Of note: significant improvement to balance with cane. -seated ipsilateral marches   Gait training: (15 min) Amblation multiple hallway lengths of 75' w/ cues  for foot clearance and cane management re:  -fwd/bwd amb -sideways stepping  -zig zag amb mirror second therapist  -speed walking   PATIENT EDUCATION: Education details: goals, POC, educated on safety during updated exercises  Person educated: Patient and Spouse Education method: Explanation, Demonstration, Tactile cues, and Verbal cues Education comprehension: verbalized understanding, returned demonstration, verbal cues required, and tactile cues required  HOME EXERCISE PROGRAM: Access Code: [email or web address] URL: [email or web address] Date: 10/11/2022 Prepared by: Precious Bard  Exercises - Seated Ankle Alphabet  - 1 x daily - 7 x weekly - 2 sets - 10 reps - 5 hold - Seated Heel Toe Raises  - 1 x daily - 7 x weekly - 2 sets - 10 reps - 5 hold - Standing March with Counter Support  - 1 x daily - 7 x weekly - 2 sets - 10 reps - 5 hold  Access Code: [email or web address] URL: [email or web address] Date: 10/30/2022 Prepared by: Precious Bard  Exercises - Seated Ankle Plantarflexion with Resistance  - 1 x daily - 7 x weekly - 2 sets - 10 reps - 5 hold - Seated Ankle Eversion with Resistance  - 1 x daily - 7 x weekly - 2 sets - 10 reps - 5 hold  Access Code: [email or web address] URL: [email or web address] Date: 11/14/2022 Prepared by: Precious Bard  Exercises - Seated Heel Toe Raises  - 1 x daily - 7 x weekly - 2 sets - 10 reps - 5 hold - Seated Hip Internal Rotation AROM  - 1 x daily - 7 x weekly - 2 sets - 10 reps - 5 hold - Bilateral Long Arc Quad  - 1 x daily - 7  x weekly - 2 sets - 10 reps - 5 hold - Foam Balance Tandem stance  - 1 x daily - 7 x weekly - 2 sets - 2 reps - 30 hold - Single Leg Balance on Foam  - 1 x daily - 7 x weekly - 2 sets - 30 reps - 2 hold - Seated Hip Flexion March with Ankle Weights  - 1 x daily - 7 x weekly - 2 sets - 10 reps - 5 hold - Standing Hip Abduction with Ankle Weight  - 1 x daily - 7 x weekly - 2 sets - 10 reps - 5 hold - Standing  Hip Extension with Ankle Weight  - 1 x daily - 7 x weekly - 2 sets - 10 reps - 5 hold - Standing Hip Flexion with Ankle Weight  - 1 x daily - 7 x weekly - 2 sets - 10 reps - 5 hold - Seated Long Arc Quad with Ankle Weight  - 1 x daily - 7 x weekly - 2 sets - 10 reps - 5 hold   GOALS: Goals reviewed with patient? Yes  SHORT TERM GOALS: Target date:01/30/2023      Patient will be independent in home exercise program to improve strength/mobility for better functional independence with ADLs. Baseline: 2/21: HEP give next session 5/15 : intermittent compliance  Goal status: progress    LONG TERM GOALS: Target date: 03/27/2023      Patient will increase FOTO score to equal to or greater than  64%   to demonstrate statistically significant improvement in mobility and quality of life.  Baseline: 2/21: 54% Goal status: INITIAL  2.  Patient will increase Berg Balance score by > 6 points (42/56)to demonstrate decreased fall risk during functional activities. Baseline: 2/21: 36/56 5/15: 45  Goal status: MET  3.  Patient will increase six minute walk test distance to >1500 for progression to age norm community ambulator and improve gait ability Baseline: 2/21: 1200 ft with [facility] 5/15: 1300 ft with [facility]  Goal status: In progress  4.  Patient will increase BLE gross strength to 4+/5 as to improve functional strength for independent gait, increased standing tolerance and increased ADL ability. Baseline: 2/21 :see above 5/15: see above  Goal status:  In progress   6.  Patient will increase Berg Balance score by > 6 points (51/56) to demonstrate decreased fall risk during functional activities. Baseline:  5/15: 45  Goal status: NEW 7.   Patient will increase UE strength to >4+/5 for ADLs, AD usage, and quality of life.  Baseline: 5/15: see above  Goal status: NEW   ASSESSMENT:  CLINICAL IMPRESSION: Patient presented to skilled PT eager to participate; pt disclosed one "bad fall"  involving twisting up of feet, falling down a hill, and landing on an asphalt road, although pt denies any serious injuries. Interventions focused on foot clearance and balance this date. Plans to have pt bring his personal TENS for FES of tib anterior next session. Patient will benefit from skilled physical therapy to improve strength, mobility, and stability for improved quality of life.   OBJECTIVE IMPAIRMENTS: Abnormal gait, decreased activity tolerance, decreased balance, decreased coordination, decreased endurance, decreased mobility, difficulty walking, decreased ROM, decreased strength, hypomobility, impaired flexibility, improper body mechanics, and postural dysfunction.   ACTIVITY LIMITATIONS: carrying, lifting, bending, standing, squatting, stairs, transfers, reach over head, locomotion level, and caring for others  PARTICIPATION LIMITATIONS: meal prep, cleaning, laundry, personal finances, driving, shopping, community activity, and yard  work  PERSONAL FACTORS: Age, Past/current experiences, Time since onset of injury/illness/exacerbation, and 3+ comorbidities: DHD, arthritis, concussion COVID, HTN, multiple lacunar infarcts, MVP, pernicious anemia, rocky mountain spotted fever, spinal stenosis, vestibular migraine, vitamin B deficiency.   are also affecting patient's functional outcome.   REHAB POTENTIAL: Good  CLINICAL DECISION MAKING: Evolving/moderate complexity  EVALUATION COMPLEXITY: Moderate  PLAN:   PT FREQUENCY: 1x/week  PT DURATION: 12 weeks  PLANNED INTERVENTIONS: Therapeutic exercises, Therapeutic activity, Neuromuscular re-education, Balance training, Gait training, Patient/Family education, Self Care, Joint mobilization, Stair training, Vestibular training, Canalith repositioning, Visual/preceptual remediation/compensation, Orthotic/Fit training, DME instructions, Cognitive remediation, Electrical stimulation, Spinal mobilization, Cryotherapy, Moist heat, Splintting,  Taping, Traction, Ultrasound, Manual therapy, and Re-evaluation  PLAN FOR NEXT SESSION:  Continue to progress UE strength w/ use of DB (pt has them at home) and balance for decreasing risk of falls. TENS for DF.  Judith Blonder, SPT  This entire session was performed under direct supervision and direction of a licensed Estate agent . I have personally read, edited and approve of the note as written.  Precious Bard, PT, DPT Physical Therapist - [facility]  Outpatient Physical Therapy- Main Campus [phone]

## 2023-02-28 ENCOUNTER — Encounter: Payer: 59 | Attending: Psychology

## 2023-02-28 DIAGNOSIS — R413 Other amnesia: Secondary | ICD-10-CM | POA: Insufficient documentation

## 2023-02-28 DIAGNOSIS — F840 Autistic disorder: Secondary | ICD-10-CM | POA: Diagnosis not present

## 2023-02-28 DIAGNOSIS — G3184 Mild cognitive impairment, so stated: Secondary | ICD-10-CM | POA: Diagnosis not present

## 2023-03-05 NOTE — Progress Notes (Signed)
Behavioral Observations:  The patient appeared well-groomed and appropriately dressed. His manners were polite and appropriate to the situation. The patient's attitude towards testing was positive and his effort was good. The patient was talkative and at times easily distracted during testing.   Neuropsychology Note  [name] completed 180 minutes of neuropsychological testing with technician, [name], BA, under the supervision of [name] Phenix, PsyD., Clinical Neuropsychologist. The patient did not appear overtly distressed by the testing session, per behavioral observation or via self-report to the technician. Rest breaks were offered.   Clinical Decision Making: In considering the patient's current level of functioning, level of presumed impairment, nature of symptoms, emotional and behavioral responses during clinical interview, level of literacy, and observed level of motivation/effort, a battery of tests was selected by Dr. Kieth Brightly during initial consultation on 02/06/2023. This was communicated to the technician. Communication between the neuropsychologist and technician was ongoing throughout the testing session and changes were made as deemed necessary based on patient performance on testing, technician observations and additional pertinent factors such as those listed above.  Tests Administered: Controlled Oral Word Association Test (COWAT; FAS & Animals)  Finger Tapping Test (FTT) Grooved Pegboard Wechsler Adult Intelligence Scale, 4th Edition (WAIS-IV) Wechsler Memory Scale, 4th Edition (WMS-IV); Adult Battery    Results:  COWAT:  FAS total= 48 Z= 0.69 Animals total= 15 Z= -0.55  FTT: R (DH) Average= 67 Percentile Rank= 75th L (NDH) Average= 61.8  Percentile Rank= 75th  Grooved Pegboard:  R (DH) time= 89s Percentile Rank= 39th L (NDH) time= 105s Percentile Rank= 40th   WAIS-IV:  Composite Score Summary  Scale Sum of Scaled Scores  Composite Score Percentile Rank 95% Conf. Interval Qualitative Description  Verbal Comprehension 40 VCI 118 88 112-123 High Average  Perceptual Reasoning 33 PRI 105 63 99-111 Average  Working Memory 25 WMI 114 82 106-120 High Average  Processing Speed 18 PSI 94 34 86-103 Average  Full Scale 116 FSIQ 110 75 106-114 High Average  General Ability 73 GAI 113 81 108-118 High Average   Verbal Comprehension Subtests Summary  Subtest Raw Score Scaled Score Percentile Rank Reference Group Scaled Score SEM  Similarities 29 12 75 12 1.08  Vocabulary 48 13 84 14 0.73  Information 23 15 95 16 0.67  The scaled scores in the Reference Group Scaled Score column are based on the performance of examinees aged 20:0-34:11 (i.e., the reference group). See Chapter 6 of the WAIS-IV Technical and Interpretive Manual for more information.  Perceptual Reasoning Subtests Summary  Subtest Raw Score Scaled Score Percentile Rank Reference Group Scaled Score SEM  Block Design 44 12 75 10 1.04  Matrix Reasoning 21 14 91 12 0.95  Visual Puzzles 9 7 16 6  0.99   Working Librarian, academic Raw Score Scaled Score Percentile Rank Reference Group Scaled Score SEM  Digit Span 36 15 95 14 0.85  Arithmetic 14 10 50 10 1.04   Processing Speed Subtests Summary  Subtest Raw Score Scaled Score Percentile Rank Reference Group Scaled Score SEM  Symbol Search 34 12 75 10 1.31  Coding 40 6 9 5  0.99    WMS-IV:  Brief Cognitive Status Exam Classification  Age Years of Education Raw Score Classification Level Base Rate  61 years 8 months 16 46 Low 3.5    Index Score Summary  Index Sum of Scaled Scores Index Score Percentile Rank 95% Confidence Interval Qualitative Descriptor  Auditory Memory (AMI) 30 85 16 80-92 Low  Average  Visual Memory (VMI) 42 103 58 97-109 Average  Visual Working Memory (VWMI) 21 103 58 96-110 Average  Immediate Memory (IMI) 34 89 23 83-96 Low Average  Delayed Memory (DMI) 38 96 39  89-103 Average   Primary Subtest Scaled Score Summary  Subtest Domain Raw Score Scaled Score Percentile Rank  Logical Memory I AM 10 3 1   Logical Memory II AM 9 5 5   Verbal Paired Associates I AM 25 9 37  Verbal Paired Associates II AM 12 13 84  Designs I VM 58 9 37  Designs II VM 47 9 37  Visual Reproduction I VM 40 13 84  Visual Reproduction II VM 26 11 63  Spatial Addition VWM 9 8 25   Symbol Span VWM 27 13 84    Auditory Memory Process Score Summary  Process Score Raw Score Scaled Score Percentile Rank Cumulative Percentage (Base Rate)  LM II Recognition 24 - - 26-50%  VPA II Recognition 38 - - 26-50%   Visual Memory Process Score Summary  Process Score Raw Score Scaled Score Percentile Rank Cumulative Percentage (Base Rate)  DE I Content 27 6 9  -  DE I Spatial 13 8 25  -  DE II Content 33 10 50 -  DE II Spatial 10 9 37 -  DE II Recognition 14 - - 26-50%  VR II Recognition 7 - - >75%   ABILITY-MEMORY ANALYSIS  Ability Score:  VCI: 118 Date of Testing:  WAIS-IV; WMS-IV 2023/02/28  Predicted Difference Method   Index Predicted WMS-IV Index Score Actual WMS-IV Index Score Difference Critical Value  Significant Difference Y/N Base Rate  Auditory Memory 109 85 24 9.00 Y 3-4%  Visual Memory 108 103 5 8.38 N   Visual Working Memory 110 103 7 10.86 N   Immediate Memory 110 89 21 10.12 Y 5%  Delayed Memory 109 96 13 9.95 Y 15-20%  Statistical significance (critical value) at the .01 level.    Feedback to Patient: [name] will return on 12/26/2023 for an interactive feedback session with Dr. Kieth Brightly at which time his test performances, clinical impressions and treatment recommendations will be reviewed in detail. The patient understands he can contact our office should he require our assistance before this time.  180 minutes spent face-to-face with patient administering standardized tests, 30 minutes spent scoring Radiographer, therapeutic). [CPT [id number], [id number]]  Full  report to follow.

## 2023-03-07 ENCOUNTER — Ambulatory Visit: Payer: 59

## 2023-03-11 ENCOUNTER — Ambulatory Visit (INDEPENDENT_AMBULATORY_CARE_PROVIDER_SITE_OTHER): Payer: 59

## 2023-03-11 DIAGNOSIS — E538 Deficiency of other specified B group vitamins: Secondary | ICD-10-CM | POA: Diagnosis not present

## 2023-03-11 MED ORDER — CYANOCOBALAMIN 1000 MCG/ML IJ SOLN
1000.0000 ug | Freq: Once | INTRAMUSCULAR | Status: AC
Start: 2023-03-11 — End: 2023-03-11
  Administered 2023-03-11: 1000 ug via INTRAMUSCULAR

## 2023-03-12 ENCOUNTER — Telehealth: Payer: Self-pay | Admitting: *Deleted

## 2023-03-12 NOTE — Telephone Encounter (Signed)
Inquiring about report from evaluation.

## 2023-03-14 ENCOUNTER — Ambulatory Visit: Payer: 59

## 2023-03-17 DIAGNOSIS — Z20822 Contact with and (suspected) exposure to covid-19: Secondary | ICD-10-CM | POA: Diagnosis not present

## 2023-03-19 NOTE — Telephone Encounter (Signed)
Spouse requesting a call back re: results of evaluation. 2nd message.

## 2023-03-24 DIAGNOSIS — Z03818 Encounter for observation for suspected exposure to other biological agents ruled out: Secondary | ICD-10-CM | POA: Diagnosis not present

## 2023-03-24 DIAGNOSIS — Z20822 Contact with and (suspected) exposure to covid-19: Secondary | ICD-10-CM | POA: Diagnosis not present

## 2023-03-26 ENCOUNTER — Ambulatory Visit: Payer: 59

## 2023-04-03 ENCOUNTER — Telehealth: Payer: Self-pay

## 2023-04-03 NOTE — Telephone Encounter (Signed)
Mr. Asano called back for his testing results. Call back phone [phone].

## 2023-04-04 ENCOUNTER — Telehealth: Payer: Self-pay

## 2023-04-04 ENCOUNTER — Ambulatory Visit: Payer: 59 | Attending: Neurosurgery

## 2023-04-04 DIAGNOSIS — R2681 Unsteadiness on feet: Secondary | ICD-10-CM | POA: Insufficient documentation

## 2023-04-04 DIAGNOSIS — M6281 Muscle weakness (generalized): Secondary | ICD-10-CM | POA: Insufficient documentation

## 2023-04-04 DIAGNOSIS — R262 Difficulty in walking, not elsewhere classified: Secondary | ICD-10-CM | POA: Insufficient documentation

## 2023-04-04 NOTE — Telephone Encounter (Signed)
Patient missed session. Patient phone called, discussed appointment with wife.Precious Bard, PT, DPT Physical Therapist - [facility]  Outpatient Physical Therapy- Main Campus [phone]

## 2023-04-10 NOTE — Therapy (Signed)
OUTPATIENT PHYSICAL THERAPY NEURO TREATMENT/RECERT   Patient Name: [name] MRN: [id number] DOB:Jan 06, 1961, 62 y.o., male Today's Date: 04/11/2023   PCP: Larae Grooms NP REFERRING PROVIDER: Susanne Borders PA  END OF SESSION:  PT End of Session - 04/11/23 1307     Visit Number 17    Number of Visits 29    Date for PT Re-Evaluation 07/04/23    Authorization Type eval 2/21    PT Start Time 1313    PT Stop Time 1358    PT Time Calculation (min) 45 min    Equipment Utilized During Treatment Gait belt    Activity Tolerance Patient tolerated treatment well    Behavior During Therapy [facility] for tasks assessed/performed                           Past Medical History:  Diagnosis Date   ADHD    Arthritis    Concussion    COVID 12/2020   Hypertension    Multiple lacunar infarcts (HCC)    MVP (mitral valve prolapse)    Pernicious anemia    [facility] spotted fever    Spinal stenosis    Vestibular migraine    Vitamin B12 deficiency    Vitamin D deficiency    Past Surgical History:  Procedure Laterality Date   ANTERIOR CERVICAL DECOMP/DISCECTOMY FUSION N/A 07/30/2022   Procedure: C4-7 ANTERIOR CERVICAL DISCECTOMY AND FUSION (GLOBUS HEDRON);  Surgeon: Venetia Night, MD;  Location: [facility] ORS;  Service: Neurosurgery;  Laterality: N/A;   BILATERAL CARPAL TUNNEL RELEASE     CERVICAL WOUND DEBRIDEMENT N/A 08/01/2022   Procedure: CERVICAL WOUND DEBRIDEMENT OF HEMATOMA;  Surgeon: Venetia Night, MD;  Location: [facility] ORS;  Service: Neurosurgery;  Laterality: N/A;   FRACTURE SURGERY Left 1984   intramedullary rod   hip bone transplant  1984   LEG SURGERY Left    oseotomy   REPAIR ANKLE LIGAMENT Right 1978   REPAIR KNEE LIGAMENT Bilateral    acl   TRIGGER FINGER RELEASE Bilateral    thumbs   Patient Active Problem List   Diagnosis Date Noted   Postoperative hematoma of musculoskeletal structure following musculoskeletal procedure  08/01/2022   Cervical myelopathy (HCC) 07/30/2022   Cervical spinal stenosis 07/30/2022   Radiculopathy, cervical 07/30/2022   S/P cervical spinal fusion 07/30/2022   Vitamin D deficiency 06/25/2022   B12 deficiency 06/25/2022   Elevated LDL cholesterol level 06/25/2022   Prediabetes 03/27/2021   History of nausea- reported 01/27/2020 02/02/2020   Accidental fall from ladder 02/02/2020   Acute right-sided thoracic back pain 02/02/2020   Neck stiffness 02/02/2020   Concussion with no loss of consciousness 02/02/2020   Multiple falls 02/02/2020   Head trauma, initial encounter 02/02/2020   Acute post-traumatic headache, not intractable 02/02/2020   Essential hypertension 08/26/2018   Pre-bariatric surgery nutrition evaluation 06/19/2018   Migraine with vertigo 01/05/2014   Arthralgia of multiple joints 04/28/2009   Carpal tunnel syndrome 08/30/2008    ONSET DATE: summer 2022  REFERRING DIAG: LE weakness and gait instability   THERAPY DIAG:  Muscle weakness (generalized) - Plan: PT plan of care cert/re-cert  Unsteadiness on feet - Plan: PT plan of care cert/re-cert  Difficulty in walking, not elsewhere classified - Plan: PT plan of care cert/re-cert  Rationale for Evaluation and Treatment: Rehabilitation  SUBJECTIVE:  SUBJECTIVE STATEMENT: Patient reports therapy has been helpful, is able to put show on easier now. Patient has not been seen in a month due to illness and out of town. Wants to work on endurance and strength.   Pt accompanied by: self  PERTINENT HISTORY:  Patient presents for LE weakness and gait instability. He is s/p ACDF C-7 on 07/30/22. Patient has a leg length discrepancy and limited L knee flexion since his 20's. Patient has a lot of atrophy and lower back pain, foot drop  bilaterally.  First symptoms was stumbling up stairs, with LLE. Foot drop was improved after ACDF. Now has numbness and tingling at the end of the day. Does have a few lesions in his pons per wife, on blood pressure medication now. PMH includes ADHD, arthritis, concussion COVID, HTN, multiple lacunar infarcts, MVP, pernicious anemia, rocky mountain spotted fever, spinal stenosis, vestibular migraine, vitamin B deficiency.   PAIN:  Are you having pain?  Denies pain   PRECAUTIONS: Other: cervical; no lifting more than 20 lb   WEIGHT BEARING RESTRICTIONS: No  FALLS: Has patient fallen in last 6 months? Yes. Number of falls multiple a week  LIVING ENVIRONMENT: Lives with: lives with their family Lives in: House/apartment Stairs: Yes: Internal: flight steps; is sleeping downstairs Has following equipment at home: Single point cane  PLOF: Independent, uses a walking stick  PATIENT GOALS: gait control of feet, strengthen legs, balance, atrophy in hand  OBJECTIVE:   DIAGNOSTIC FINDINGS:  09/11/22: No acute fracture. No spondylolisthesis. No compression deformities. Disc space narrowing C3-4 with marginal osteophytes consistent with degenerative disc disease. Postop changes ACDF C4-C7 with anatomic alignment. Normal prevertebral and cervicocranial soft tissues.   06/19/22: 1. L5 chronic pars defects with L5-S1 anterolisthesis and advanced disc degeneration causing marked biforaminal L5 impingement. 2. Degeneration and short pedicles causes compressive spinal stenosis from L1-2 to L3-4, most advanced at L2-3. Moderate bilateral foraminal narrowing at the same levels.   COGNITION: Overall cognitive status: Within functional limits for tasks assessed    COORDINATION: Limited LLE  MUSCLE LENGTH: Significant limitation L hamstring; 60 degree flexion deficit.   POSTURE:  weight shift to the R  LOWER EXTREMITY ROM:     L knee flexion contraction -60 contraction LOWER EXTREMITY MMT:     MMT Right Eval R 5/15 R 8/22 Left Eval L 5/15 L 8/22  Hip flexion 4 5 5 4  4+ 4  Hip extension        Hip abduction 4- 4 4 4- 4 4  Hip adduction 4- 4 4 4- 4- 4  Hip internal rotation        Hip external rotation        Knee flexion 4- 4 4 3  4- 4-  Knee extension 4 4+ 5 4 4  4-  Ankle dorsiflexion 3 3 3+ 2+ 2+ 2+  Ankle plantarflexion 3 3+ 4- 3 3+ 3+  Ankle inversion        Ankle eversion        (Blank rows = not tested)  01/02/23 Right 8/22: RECERT  Left 8/22: Recert  Shoulder Flexion 4 4+ 4- 4+  Shoulder Abduction 4- 4 4- 4  Shoulder Extension 4- 4+ 4- 4  Shoulder adduction 4- 4 3+ 4  Bicep  4 4+ 4 4+  Triceps 4- 4- 3+ 4      BED MOBILITY:  Perform next session  TRANSFERS: Assistive device utilized: Single point cane  Sit to stand: CGA Stand to sit: CGA  Chair to chair: CGA   GAIT: Gait pattern: L knee limited extension, L foot drop, L foot eversion Distance walked: 1200 Assistive device utilized: Single point cane Level of assistance: CGA Comments: Patient has increased eversion and rolling of L foot, LLE leg length discrepancy.   FUNCTIONAL TESTS:  5 times sit to stand: 5.6 seconds with minor use of LLE no UE support Timed up and go (TUG): n/a 6 minute walk test: 1200 ft 10 meter walk test: 7 seconds BERG: 36/56   PATIENT SURVEYS:  FOTO 54  TODAY'S TREATMENT:                                                                                                                              DATE: 04/11/23 Unless otherwise stated, CGA was provided and gait belt donned in order to ensure pt safety  Goals performed: see below for details:   Education on placement of electrodes for home stim unit. Education on utilization of Mod I; during on cycle on setting 4 to evert foot, relax during relaxing stage   PATIENT EDUCATION: Education details: goals, POC, educated on safety during updated exercises  Person educated: Patient and Spouse Education method:  Explanation, Demonstration, Tactile cues, and Verbal cues Education comprehension: verbalized understanding, returned demonstration, verbal cues required, and tactile cues required  HOME EXERCISE PROGRAM: Access Code: [email or web address] URL: [email or web address] Date: 10/11/2022 Prepared by: Precious Bard  Exercises - Seated Ankle Alphabet  - 1 x daily - 7 x weekly - 2 sets - 10 reps - 5 hold - Seated Heel Toe Raises  - 1 x daily - 7 x weekly - 2 sets - 10 reps - 5 hold - Standing March with Counter Support  - 1 x daily - 7 x weekly - 2 sets - 10 reps - 5 hold  Access Code: [email or web address] URL: [email or web address] Date: 10/30/2022 Prepared by: Precious Bard  Exercises - Seated Ankle Plantarflexion with Resistance  - 1 x daily - 7 x weekly - 2 sets - 10 reps - 5 hold - Seated Ankle Eversion with Resistance  - 1 x daily - 7 x weekly - 2 sets - 10 reps - 5 hold  Access Code: [email or web address] URL: [email or web address] Date: 11/14/2022 Prepared by: Precious Bard  Exercises - Seated Heel Toe Raises  - 1 x daily - 7 x weekly - 2 sets - 10 reps - 5 hold - Seated Hip Internal Rotation AROM  - 1 x daily - 7 x weekly - 2 sets - 10 reps - 5 hold - Bilateral Long Arc Quad  - 1 x daily - 7 x weekly - 2 sets - 10 reps - 5 hold - Foam Balance Tandem stance  - 1 x daily - 7 x weekly - 2 sets - 2 reps - 30 hold - Single Leg Balance on Foam  - 1 x daily - 7 x weekly - 2 sets - 30 reps - 2 hold - Seated  Hip Flexion March with Ankle Weights  - 1 x daily - 7 x weekly - 2 sets - 10 reps - 5 hold - Standing Hip Abduction with Ankle Weight  - 1 x daily - 7 x weekly - 2 sets - 10 reps - 5 hold - Standing Hip Extension with Ankle Weight  - 1 x daily - 7 x weekly - 2 sets - 10 reps - 5 hold - Standing Hip Flexion with Ankle Weight  - 1 x daily - 7 x weekly - 2 sets - 10 reps - 5 hold - Seated Long Arc Quad with Ankle Weight  - 1 x daily - 7 x weekly - 2 sets - 10 reps - 5  hold   GOALS: Goals reviewed with patient? Yes  SHORT TERM GOALS: Target date: 05/09/2023        Patient will be independent in home exercise program to improve strength/mobility for better functional independence with ADLs. Baseline: 2/21: HEP give next session 5/15 : intermittent compliance 8/22: intermittent compliance due to illness  Goal status: progress    LONG TERM GOALS: Target date:07/04/2023        Patient will increase FOTO score to equal to or greater than  64%   to demonstrate statistically significant improvement in mobility and quality of life.  Baseline: 2/21: 54% 8/22: 52%  Goal status: I On going   2.  Patient will increase Berg Balance score by > 6 points (42/56)to demonstrate decreased fall risk during functional activities. Baseline: 2/21: 36/56 5/15: 45  Goal status: MET  3.  Patient will increase six minute walk test distance to >1500 for progression to age norm community ambulator and improve gait ability Baseline: 2/21: 1200 ft with [facility] 5/15: 1300 ft with [facility]  8/22: 905 ft with [facility] Goal status: In progress  4.  Patient will increase BLE gross strength to 4+/5 as to improve functional strength for independent gait, increased standing tolerance and increased ADL ability. Baseline: 2/21 :see above 5/15: see above  Goal status:  In progress   6.  Patient will increase Berg Balance score by > 6 points (51/56) to demonstrate decreased fall risk during functional activities. Baseline:  5/15: 45 8/22: 41/56  Goal status: Ongoing 7.   Patient will increase UE strength to >4+/5 for ADLs, AD usage, and quality of life.  Baseline: 5/15: see above 8/22: see above Goal status: Partially Met    ASSESSMENT:  CLINICAL IMPRESSION:  Patient is retuning to PT after a month's absence from COVID illness. His balance and capacity for functional gait mechanics is decreased due to recent illness and will benefit from trial PT cert to improve stability and  mobility. Patient agreeable to trial recert at this time due to deficits.  Patient will benefit from skilled physical therapy to improve strength, mobility, and stability for improved quality of life.   OBJECTIVE IMPAIRMENTS: Abnormal gait, decreased activity tolerance, decreased balance, decreased coordination, decreased endurance, decreased mobility, difficulty walking, decreased ROM, decreased strength, hypomobility, impaired flexibility, improper body mechanics, and postural dysfunction.   ACTIVITY LIMITATIONS: carrying, lifting, bending, standing, squatting, stairs, transfers, reach over head, locomotion level, and caring for others  PARTICIPATION LIMITATIONS: meal prep, cleaning, laundry, personal finances, driving, shopping, community activity, and yard work  PERSONAL FACTORS: Age, Past/current experiences, Time since onset of injury/illness/exacerbation, and 3+ comorbidities: DHD, arthritis, concussion COVID, HTN, multiple lacunar infarcts, MVP, pernicious anemia, rocky mountain spotted fever, spinal stenosis, vestibular migraine, vitamin B deficiency.   are  also affecting patient's functional outcome.   REHAB POTENTIAL: Good  CLINICAL DECISION MAKING: Evolving/moderate complexity  EVALUATION COMPLEXITY: Moderate  PLAN:   PT FREQUENCY: 1x/week  PT DURATION: 12 weeks  PLANNED INTERVENTIONS: Therapeutic exercises, Therapeutic activity, Neuromuscular re-education, Balance training, Gait training, Patient/Family education, Self Care, Joint mobilization, Stair training, Vestibular training, Canalith repositioning, Visual/preceptual remediation/compensation, Orthotic/Fit training, DME instructions, Cognitive remediation, Electrical stimulation, Spinal mobilization, Cryotherapy, Moist heat, Splintting, Taping, Traction, Ultrasound, Manual therapy, and Re-evaluation  PLAN FOR NEXT SESSION:  Continue to progress UE strength w/ use of DB (pt has them at home) and balance for decreasing risk of  falls. TENS for DF.    Precious Bard, PT, DPT Physical Therapist - [place] [facility]  Outpatient Physical Therapy- Main Campus [phone]

## 2023-04-11 ENCOUNTER — Ambulatory Visit: Payer: 59

## 2023-04-11 DIAGNOSIS — M6281 Muscle weakness (generalized): Secondary | ICD-10-CM | POA: Diagnosis not present

## 2023-04-11 DIAGNOSIS — R262 Difficulty in walking, not elsewhere classified: Secondary | ICD-10-CM

## 2023-04-11 DIAGNOSIS — R2681 Unsteadiness on feet: Secondary | ICD-10-CM | POA: Diagnosis not present

## 2023-04-17 NOTE — Therapy (Signed)
OUTPATIENT PHYSICAL THERAPY NEURO TREATMENT/RECERT   Patient Name: [name] MRN: [id number] DOB:October 27, 1960, 62 y.o., male Today's Date: 04/17/2023   PCP: Larae Grooms NP REFERRING PROVIDER: Susanne Borders PA  END OF SESSION:                  Past Medical History:  Diagnosis Date   ADHD    Arthritis    Concussion    COVID 12/2020   Hypertension    Multiple lacunar infarcts (HCC)    MVP (mitral valve prolapse)    Pernicious anemia    [facility] spotted fever    Spinal stenosis    Vestibular migraine    Vitamin B12 deficiency    Vitamin D deficiency    Past Surgical History:  Procedure Laterality Date   ANTERIOR CERVICAL DECOMP/DISCECTOMY FUSION N/A 07/30/2022   Procedure: C4-7 ANTERIOR CERVICAL DISCECTOMY AND FUSION (GLOBUS HEDRON);  Surgeon: Venetia Night, MD;  Location: [facility] ORS;  Service: Neurosurgery;  Laterality: N/A;   BILATERAL CARPAL TUNNEL RELEASE     CERVICAL WOUND DEBRIDEMENT N/A 08/01/2022   Procedure: CERVICAL WOUND DEBRIDEMENT OF HEMATOMA;  Surgeon: Venetia Night, MD;  Location: [facility] ORS;  Service: Neurosurgery;  Laterality: N/A;   FRACTURE SURGERY Left 1984   intramedullary rod   hip bone transplant  1984   LEG SURGERY Left    oseotomy   REPAIR ANKLE LIGAMENT Right 1978   REPAIR KNEE LIGAMENT Bilateral    acl   TRIGGER FINGER RELEASE Bilateral    thumbs   Patient Active Problem List   Diagnosis Date Noted   Postoperative hematoma of musculoskeletal structure following musculoskeletal procedure 08/01/2022   Cervical myelopathy (HCC) 07/30/2022   Cervical spinal stenosis 07/30/2022   Radiculopathy, cervical 07/30/2022   S/P cervical spinal fusion 07/30/2022   Vitamin D deficiency 06/25/2022   B12 deficiency 06/25/2022   Elevated LDL cholesterol level 06/25/2022   Prediabetes 03/27/2021   History of nausea- reported 01/27/2020 02/02/2020   Accidental fall from ladder 02/02/2020   Acute right-sided  thoracic back pain 02/02/2020   Neck stiffness 02/02/2020   Concussion with no loss of consciousness 02/02/2020   Multiple falls 02/02/2020   Head trauma, initial encounter 02/02/2020   Acute post-traumatic headache, not intractable 02/02/2020   Essential hypertension 08/26/2018   Pre-bariatric surgery nutrition evaluation 06/19/2018   Migraine with vertigo 01/05/2014   Arthralgia of multiple joints 04/28/2009   Carpal tunnel syndrome 08/30/2008    ONSET DATE: summer 2022  REFERRING DIAG: LE weakness and gait instability   THERAPY DIAG:  No diagnosis found.  Rationale for Evaluation and Treatment: Rehabilitation  SUBJECTIVE:  SUBJECTIVE STATEMENT: Patient reports therapy has been helpful, is able to put show on easier now. Patient has not been seen in a month due to illness and out of town. Wants to work on endurance and strength.   Pt accompanied by: self  PERTINENT HISTORY:  Patient presents for LE weakness and gait instability. He is s/p ACDF C-7 on 07/30/22. Patient has a leg length discrepancy and limited L knee flexion since his 20's. Patient has a lot of atrophy and lower back pain, foot drop bilaterally.  First symptoms was stumbling up stairs, with LLE. Foot drop was improved after ACDF. Now has numbness and tingling at the end of the day. Does have a few lesions in his pons per wife, on blood pressure medication now. PMH includes ADHD, arthritis, concussion COVID, HTN, multiple lacunar infarcts, MVP, pernicious anemia, rocky mountain spotted fever, spinal stenosis, vestibular migraine, vitamin B deficiency.   PAIN:  Are you having pain?  Denies pain   PRECAUTIONS: Other: cervical; no lifting more than 20 lb   WEIGHT BEARING RESTRICTIONS: No  FALLS: Has patient fallen in last 6 months?  Yes. Number of falls multiple a week  LIVING ENVIRONMENT: Lives with: lives with their family Lives in: House/apartment Stairs: Yes: Internal: flight steps; is sleeping downstairs Has following equipment at home: Single point cane  PLOF: Independent, uses a walking stick  PATIENT GOALS: gait control of feet, strengthen legs, balance, atrophy in hand  OBJECTIVE:   DIAGNOSTIC FINDINGS:  09/11/22: No acute fracture. No spondylolisthesis. No compression deformities. Disc space narrowing C3-4 with marginal osteophytes consistent with degenerative disc disease. Postop changes ACDF C4-C7 with anatomic alignment. Normal prevertebral and cervicocranial soft tissues.   06/19/22: 1. L5 chronic pars defects with L5-S1 anterolisthesis and advanced disc degeneration causing marked biforaminal L5 impingement. 2. Degeneration and short pedicles causes compressive spinal stenosis from L1-2 to L3-4, most advanced at L2-3. Moderate bilateral foraminal narrowing at the same levels.   COGNITION: Overall cognitive status: Within functional limits for tasks assessed    COORDINATION: Limited LLE  MUSCLE LENGTH: Significant limitation L hamstring; 60 degree flexion deficit.   POSTURE:  weight shift to the R  LOWER EXTREMITY ROM:     L knee flexion contraction -60 contraction LOWER EXTREMITY MMT:    MMT Right Eval R 5/15 R 8/22 Left Eval L 5/15 L 8/22  Hip flexion 4 5 5 4  4+ 4  Hip extension        Hip abduction 4- 4 4 4- 4 4  Hip adduction 4- 4 4 4- 4- 4  Hip internal rotation        Hip external rotation        Knee flexion 4- 4 4 3  4- 4-  Knee extension 4 4+ 5 4 4  4-  Ankle dorsiflexion 3 3 3+ 2+ 2+ 2+  Ankle plantarflexion 3 3+ 4- 3 3+ 3+  Ankle inversion        Ankle eversion        (Blank rows = not tested)  01/02/23 Right 8/22: RECERT  Left 8/22: Recert  Shoulder Flexion 4 4+ 4- 4+  Shoulder Abduction 4- 4 4- 4  Shoulder Extension 4- 4+ 4- 4  Shoulder adduction 4- 4 3+ 4   Bicep  4 4+ 4 4+  Triceps 4- 4- 3+ 4      BED MOBILITY:  Perform next session  TRANSFERS: Assistive device utilized: Single point cane  Sit to stand: CGA Stand to sit: CGA  Chair to chair: CGA   GAIT: Gait pattern: L knee limited extension, L foot drop, L foot eversion Distance walked: 1200 Assistive device utilized: Single point cane Level of assistance: CGA Comments: Patient has increased eversion and rolling of L foot, LLE leg length discrepancy.   FUNCTIONAL TESTS:  5 times sit to stand: 5.6 seconds with minor use of LLE no UE support Timed up and go (TUG): n/a 6 minute walk test: 1200 ft 10 meter walk test: 7 seconds BERG: 36/56   PATIENT SURVEYS:  FOTO 54  TODAY'S TREATMENT:                                                                                                                              DATE: 04/17/23 Unless otherwise stated, CGA was provided and gait belt donned in order to ensure pt safety  TherEx: ( -2 x 10:  seated calf raises, added 2# for resistance, pt reported difficult  -2 x 10 x 3 sec hold B: seated marches with 2# AW, pt reported medium. Cued to scoot to edge of chair to avoid L hip impingement   NMRE:   -standing on Airex pad re:           -B tandem, one-two finger support           -eyes closed, eyes open -tandem walking on 2" by 4" in // bars with min RUE support x 2  -tandem walking on line x 10 laps, 5 w/o cane and 5 w/ cane. Of note: significant improvement to balance with cane. -seated ipsilateral marches    PATIENT EDUCATION: Education details: goals, POC, educated on safety during updated exercises  Person educated: Patient and Spouse Education method: Explanation, Demonstration, Tactile cues, and Verbal cues Education comprehension: verbalized understanding, returned demonstration, verbal cues required, and tactile cues required  HOME EXERCISE PROGRAM: Access Code: [email or web address] URL: [email or web address] Date:  10/11/2022 Prepared by: Precious Bard  Exercises - Seated Ankle Alphabet  - 1 x daily - 7 x weekly - 2 sets - 10 reps - 5 hold - Seated Heel Toe Raises  - 1 x daily - 7 x weekly - 2 sets - 10 reps - 5 hold - Standing March with Counter Support  - 1 x daily - 7 x weekly - 2 sets - 10 reps - 5 hold  Access Code: [email or web address] URL: [email or web address] Date: 10/30/2022 Prepared by: Precious Bard  Exercises - Seated Ankle Plantarflexion with Resistance  - 1 x daily - 7 x weekly - 2 sets - 10 reps - 5 hold - Seated Ankle Eversion with Resistance  - 1 x daily - 7 x weekly - 2 sets - 10 reps - 5 hold  Access Code: [email or web address] URL: [email or web address] Date: 11/14/2022 Prepared by: Precious Bard  Exercises - Seated Heel Toe Raises  - 1 x daily - 7 x weekly - 2 sets - 10 reps - 5 hold - Seated Hip Internal Rotation AROM  -  1 x daily - 7 x weekly - 2 sets - 10 reps - 5 hold - Bilateral Long Arc Quad  - 1 x daily - 7 x weekly - 2 sets - 10 reps - 5 hold - Foam Balance Tandem stance  - 1 x daily - 7 x weekly - 2 sets - 2 reps - 30 hold - Single Leg Balance on Foam  - 1 x daily - 7 x weekly - 2 sets - 30 reps - 2 hold - Seated Hip Flexion March with Ankle Weights  - 1 x daily - 7 x weekly - 2 sets - 10 reps - 5 hold - Standing Hip Abduction with Ankle Weight  - 1 x daily - 7 x weekly - 2 sets - 10 reps - 5 hold - Standing Hip Extension with Ankle Weight  - 1 x daily - 7 x weekly - 2 sets - 10 reps - 5 hold - Standing Hip Flexion with Ankle Weight  - 1 x daily - 7 x weekly - 2 sets - 10 reps - 5 hold - Seated Long Arc Quad with Ankle Weight  - 1 x daily - 7 x weekly - 2 sets - 10 reps - 5 hold   GOALS: Goals reviewed with patient? Yes  SHORT TERM GOALS: Target date: 05/09/2023        Patient will be independent in home exercise program to improve strength/mobility for better functional independence with ADLs. Baseline: 2/21: HEP give next session 5/15 : intermittent  compliance 8/22: intermittent compliance due to illness  Goal status: progress    LONG TERM GOALS: Target date:07/04/2023        Patient will increase FOTO score to equal to or greater than  64%   to demonstrate statistically significant improvement in mobility and quality of life.  Baseline: 2/21: 54% 8/22: 52%  Goal status: I On going   2.  Patient will increase Berg Balance score by > 6 points (42/56)to demonstrate decreased fall risk during functional activities. Baseline: 2/21: 36/56 5/15: 45  Goal status: MET  3.  Patient will increase six minute walk test distance to >1500 for progression to age norm community ambulator and improve gait ability Baseline: 2/21: 1200 ft with [facility] 5/15: 1300 ft with [facility]  8/22: 905 ft with [facility] Goal status: In progress  4.  Patient will increase BLE gross strength to 4+/5 as to improve functional strength for independent gait, increased standing tolerance and increased ADL ability. Baseline: 2/21 :see above 5/15: see above  Goal status:  In progress   6.  Patient will increase Berg Balance score by > 6 points (51/56) to demonstrate decreased fall risk during functional activities. Baseline:  5/15: 45 8/22: 41/56  Goal status: Ongoing 7.   Patient will increase UE strength to >4+/5 for ADLs, AD usage, and quality of life.  Baseline: 5/15: see above 8/22: see above Goal status: Partially Met    ASSESSMENT:  CLINICAL IMPRESSION: *** Patient will benefit from skilled physical therapy to improve strength, mobility, and stability for improved quality of life.   OBJECTIVE IMPAIRMENTS: Abnormal gait, decreased activity tolerance, decreased balance, decreased coordination, decreased endurance, decreased mobility, difficulty walking, decreased ROM, decreased strength, hypomobility, impaired flexibility, improper body mechanics, and postural dysfunction.   ACTIVITY LIMITATIONS: carrying, lifting, bending, standing, squatting, stairs, transfers,  reach over head, locomotion level, and caring for others  PARTICIPATION LIMITATIONS: meal prep, cleaning, laundry, personal finances, driving, shopping, community activity, and yard work  PERSONAL FACTORS: Age, Past/current experiences, Time since onset of injury/illness/exacerbation, and 3+ comorbidities: DHD, arthritis, concussion COVID, HTN, multiple lacunar infarcts, MVP, pernicious anemia, rocky mountain spotted fever, spinal stenosis, vestibular migraine, vitamin B deficiency.   are also affecting patient's functional outcome.   REHAB POTENTIAL: Good  CLINICAL DECISION MAKING: Evolving/moderate complexity  EVALUATION COMPLEXITY: Moderate  PLAN:   PT FREQUENCY: 1x/week  PT DURATION: 12 weeks  PLANNED INTERVENTIONS: Therapeutic exercises, Therapeutic activity, Neuromuscular re-education, Balance training, Gait training, Patient/Family education, Self Care, Joint mobilization, Stair training, Vestibular training, Canalith repositioning, Visual/preceptual remediation/compensation, Orthotic/Fit training, DME instructions, Cognitive remediation, Electrical stimulation, Spinal mobilization, Cryotherapy, Moist heat, Splintting, Taping, Traction, Ultrasound, Manual therapy, and Re-evaluation  PLAN FOR NEXT SESSION:  Continue to progress UE strength w/ use of DB (pt has them at home) and balance for decreasing risk of falls. TENS for DF.    Precious Bard, PT, DPT Physical Therapist - [facility]  Outpatient Physical Therapy- [facility] [phone]

## 2023-04-18 ENCOUNTER — Other Ambulatory Visit: Payer: Self-pay | Admitting: Family Medicine

## 2023-04-18 ENCOUNTER — Ambulatory Visit: Payer: 59

## 2023-04-18 DIAGNOSIS — R262 Difficulty in walking, not elsewhere classified: Secondary | ICD-10-CM | POA: Diagnosis not present

## 2023-04-18 DIAGNOSIS — R2681 Unsteadiness on feet: Secondary | ICD-10-CM

## 2023-04-18 DIAGNOSIS — M6281 Muscle weakness (generalized): Secondary | ICD-10-CM | POA: Diagnosis not present

## 2023-04-18 DIAGNOSIS — G959 Disease of spinal cord, unspecified: Secondary | ICD-10-CM

## 2023-04-24 ENCOUNTER — Ambulatory Visit: Payer: 59 | Attending: Neurosurgery

## 2023-04-24 DIAGNOSIS — M6281 Muscle weakness (generalized): Secondary | ICD-10-CM | POA: Insufficient documentation

## 2023-04-24 DIAGNOSIS — R2681 Unsteadiness on feet: Secondary | ICD-10-CM | POA: Diagnosis not present

## 2023-04-24 DIAGNOSIS — R262 Difficulty in walking, not elsewhere classified: Secondary | ICD-10-CM | POA: Diagnosis not present

## 2023-04-24 NOTE — Therapy (Signed)
OUTPATIENT PHYSICAL THERAPY NEURO TREATMENT   Patient Name: [name] MRN: [id number] DOB:[birth year], 62 y.o., male Today's Date: 04/24/2023   PCP: Larae Grooms NP REFERRING PROVIDER: Susanne Borders PA  END OF SESSION:  PT End of Session - 04/24/23 1254     Visit Number 19    Number of Visits 29    Date for PT Re-Evaluation 07/04/23    Authorization Type eval 2/21    PT Start Time 1036    PT Stop Time 1100    PT Time Calculation (min) 24 min    Equipment Utilized During Treatment Gait belt    Activity Tolerance Patient tolerated treatment well    Behavior During Therapy WFL for tasks assessed/performed              Past Medical History:  Diagnosis Date   ADHD    Arthritis    Concussion    COVID 12/2020   Hypertension    Multiple lacunar infarcts (HCC)    MVP (mitral valve prolapse)    Pernicious anemia    [facility] spotted fever    Spinal stenosis    Vestibular migraine    Vitamin B12 deficiency    Vitamin D deficiency    Past Surgical History:  Procedure Laterality Date   ANTERIOR CERVICAL DECOMP/DISCECTOMY FUSION N/A 07/30/2022   Procedure: C4-7 ANTERIOR CERVICAL DISCECTOMY AND FUSION (GLOBUS HEDRON);  Surgeon: Venetia Night, MD;  Location: [facility] ORS;  Service: Neurosurgery;  Laterality: N/A;   BILATERAL CARPAL TUNNEL RELEASE     CERVICAL WOUND DEBRIDEMENT N/A 08/01/2022   Procedure: CERVICAL WOUND DEBRIDEMENT OF HEMATOMA;  Surgeon: Venetia Night, MD;  Location: [facility] ORS;  Service: Neurosurgery;  Laterality: N/A;   FRACTURE SURGERY Left 1984   intramedullary rod   hip bone transplant  1984   LEG SURGERY Left    oseotomy   REPAIR ANKLE LIGAMENT Right 1978   REPAIR KNEE LIGAMENT Bilateral    acl   TRIGGER FINGER RELEASE Bilateral    thumbs   Patient Active Problem List   Diagnosis Date Noted   Postoperative hematoma of musculoskeletal structure following musculoskeletal procedure 08/01/2022   Cervical myelopathy (HCC)  07/30/2022   Cervical spinal stenosis 07/30/2022   Radiculopathy, cervical 07/30/2022   S/P cervical spinal fusion 07/30/2022   Vitamin D deficiency 06/25/2022   B12 deficiency 06/25/2022   Elevated LDL cholesterol level 06/25/2022   Prediabetes 03/27/2021   History of nausea- reported 01/27/2020 02/02/2020   Accidental fall from ladder 02/02/2020   Acute right-sided thoracic back pain 02/02/2020   Neck stiffness 02/02/2020   Concussion with no loss of consciousness 02/02/2020   Multiple falls 02/02/2020   Head trauma, initial encounter 02/02/2020   Acute post-traumatic headache, not intractable 02/02/2020   Essential hypertension 08/26/2018   Pre-bariatric surgery nutrition evaluation 06/19/2018   Migraine with vertigo 01/05/2014   Arthralgia of multiple joints 04/28/2009   Carpal tunnel syndrome 08/30/2008    ONSET DATE: summer 2022  REFERRING DIAG: LE weakness and gait instability   THERAPY DIAG:  Muscle weakness (generalized)  Unsteadiness on feet  Difficulty in walking, not elsewhere classified  Rationale for Evaluation and Treatment: Rehabilitation  SUBJECTIVE:  SUBJECTIVE STATEMENT:  Pt states he rode his bicycle for the first time the other day.  He stated it was not pretty, but he was able to manage.    Pt accompanied by: self  PERTINENT HISTORY:  Patient presents for LE weakness and gait instability. He is s/p ACDF C-7 on 07/30/22. Patient has a leg length discrepancy and limited L knee flexion since his 20's. Patient has a lot of atrophy and lower back pain, foot drop bilaterally.  First symptoms was stumbling up stairs, with LLE. Foot drop was improved after ACDF. Now has numbness and tingling at the end of the day. Does have a few lesions in his pons per wife, on blood pressure  medication now. PMH includes ADHD, arthritis, concussion COVID, HTN, multiple lacunar infarcts, MVP, pernicious anemia, rocky mountain spotted fever, spinal stenosis, vestibular migraine, vitamin B deficiency.   PAIN:  Are you having pain?  Denies pain   PRECAUTIONS: Other: cervical; no lifting more than 20 lb   WEIGHT BEARING RESTRICTIONS: No  FALLS: Has patient fallen in last 6 months? Yes. Number of falls multiple a week  LIVING ENVIRONMENT: Lives with: lives with their family Lives in: House/apartment Stairs: Yes: Internal: flight steps; is sleeping downstairs Has following equipment at home: Single point cane  PLOF: Independent, uses a walking stick  PATIENT GOALS: gait control of feet, strengthen legs, balance, atrophy in hand  OBJECTIVE:   DIAGNOSTIC FINDINGS:  09/11/22: No acute fracture. No spondylolisthesis. No compression deformities. Disc space narrowing C3-4 with marginal osteophytes consistent with degenerative disc disease. Postop changes ACDF C4-C7 with anatomic alignment. Normal prevertebral and cervicocranial soft tissues.   06/19/22: 1. L5 chronic pars defects with L5-S1 anterolisthesis and advanced disc degeneration causing marked biforaminal L5 impingement. 2. Degeneration and short pedicles causes compressive spinal stenosis from L1-2 to L3-4, most advanced at L2-3. Moderate bilateral foraminal narrowing at the same levels.   COGNITION: Overall cognitive status: Within functional limits for tasks assessed    COORDINATION: Limited LLE  MUSCLE LENGTH: Significant limitation L hamstring; 60 degree flexion deficit.   POSTURE:  weight shift to the R  LOWER EXTREMITY ROM:     L knee flexion contraction -60 contraction LOWER EXTREMITY MMT:    MMT Right Eval R 5/15 R 8/22 Left Eval L 5/15 L 8/22  Hip flexion 4 5 5 4  4+ 4  Hip extension        Hip abduction 4- 4 4 4- 4 4  Hip adduction 4- 4 4 4- 4- 4  Hip internal rotation        Hip external  rotation        Knee flexion 4- 4 4 3  4- 4-  Knee extension 4 4+ 5 4 4  4-  Ankle dorsiflexion 3 3 3+ 2+ 2+ 2+  Ankle plantarflexion 3 3+ 4- 3 3+ 3+  Ankle inversion        Ankle eversion        (Blank rows = not tested)  01/02/23 Right 8/22: RECERT  Left 8/22: Recert  Shoulder Flexion 4 4+ 4- 4+  Shoulder Abduction 4- 4 4- 4  Shoulder Extension 4- 4+ 4- 4  Shoulder adduction 4- 4 3+ 4  Bicep  4 4+ 4 4+  Triceps 4- 4- 3+ 4      BED MOBILITY:  Perform next session  TRANSFERS: Assistive device utilized: Single point cane  Sit to stand: CGA Stand to sit: CGA Chair to chair: CGA   GAIT: Gait  pattern: L knee limited extension, L foot drop, L foot eversion Distance walked: 1200 Assistive device utilized: Single point cane Level of assistance: CGA Comments: Patient has increased eversion and rolling of L foot, LLE leg length discrepancy.   FUNCTIONAL TESTS:  5 times sit to stand: 5.6 seconds with minor use of LLE no UE support Timed up and go (TUG): n/a 6 minute walk test: 1200 ft 10 meter walk test: 7 seconds BERG: 36/56   PATIENT SURVEYS:  FOTO 54  TODAY'S TREATMENT: DATE: 04/24/23 Unless otherwise stated, CGA was provided and gait belt donned in order to ensure pt safety  TherEx:  Witches brew rotations with 7.5lb resistance at Matrix: 30sec bouts each side  [place] 10x backwards, 10x forwards Standing scapular retraction, 12.5# at Matrix, 2x10 Seated figure 4 stretch, 30 sec bouts, x2 each LE Seated piriformis stretch, 30 sec bouts, x2 each LE Seated hamstring stretch, 30 sec bouts each LE Standing heel raise with UE support, 2x15 Seated hip adduction into physioball, 3 sec holds, 2x15 4" step toe taps 15x each     PATIENT EDUCATION: Education details: goals, POC, educated on safety during updated exercises  Person educated: Patient and Spouse Education method: Explanation, Demonstration, Tactile cues, and Verbal cues Education comprehension: verbalized  understanding, returned demonstration, verbal cues required, and tactile cues required  HOME EXERCISE PROGRAM: Access Code: [email or web address] URL: [email or web address] Date: 10/11/2022 Prepared by: Precious Bard  Exercises - Seated Ankle Alphabet  - 1 x daily - 7 x weekly - 2 sets - 10 reps - 5 hold - Seated Heel Toe Raises  - 1 x daily - 7 x weekly - 2 sets - 10 reps - 5 hold - Standing March with Counter Support  - 1 x daily - 7 x weekly - 2 sets - 10 reps - 5 hold  Access Code: [email or web address] URL: [email or web address] Date: 10/30/2022 Prepared by: Precious Bard  Exercises - Seated Ankle Plantarflexion with Resistance  - 1 x daily - 7 x weekly - 2 sets - 10 reps - 5 hold - Seated Ankle Eversion with Resistance  - 1 x daily - 7 x weekly - 2 sets - 10 reps - 5 hold  Access Code: [email or web address] URL: [email or web address] Date: 11/14/2022 Prepared by: Precious Bard  Exercises - Seated Heel Toe Raises  - 1 x daily - 7 x weekly - 2 sets - 10 reps - 5 hold - Seated Hip Internal Rotation AROM  - 1 x daily - 7 x weekly - 2 sets - 10 reps - 5 hold - Bilateral Long Arc Quad  - 1 x daily - 7 x weekly - 2 sets - 10 reps - 5 hold - Foam Balance Tandem stance  - 1 x daily - 7 x weekly - 2 sets - 2 reps - 30 hold - Single Leg Balance on Foam  - 1 x daily - 7 x weekly - 2 sets - 30 reps - 2 hold - Seated Hip Flexion March with Ankle Weights  - 1 x daily - 7 x weekly - 2 sets - 10 reps - 5 hold - Standing Hip Abduction with Ankle Weight  - 1 x daily - 7 x weekly - 2 sets - 10 reps - 5 hold - Standing Hip Extension with Ankle Weight  - 1 x daily - 7 x weekly - 2 sets - 10 reps - 5 hold - Standing Hip Flexion with Ankle Weight  - 1 x daily - 7 x weekly -  2 sets - 10 reps - 5 hold - Seated Long Arc Quad with Ankle Weight  - 1 x daily - 7 x weekly - 2 sets - 10 reps - 5 hold   GOALS: Goals reviewed with patient? Yes  SHORT TERM GOALS: Target date: 05/09/2023  Patient will  be independent in home exercise program to improve strength/mobility for better functional independence with ADLs. Baseline: 2/21: HEP give next session 5/15 : intermittent compliance 8/22: intermittent compliance due to illness  Goal status: progress    LONG TERM GOALS: Target date:07/04/2023  Patient will increase FOTO score to equal to or greater than  64%   to demonstrate statistically significant improvement in mobility and quality of life.  Baseline: 2/21: 54% 8/22: 52%  Goal status: I On going   2.  Patient will increase Berg Balance score by > 6 points (42/56)to demonstrate decreased fall risk during functional activities. Baseline: 2/21: 36/56 5/15: 45  Goal status: MET  3.  Patient will increase six minute walk test distance to >1500 for progression to age norm community ambulator and improve gait ability Baseline: 2/21: 1200 ft with [facility] 5/15: 1300 ft with [facility]  8/22: 905 ft with [facility] Goal status: In progress  4.  Patient will increase BLE gross strength to 4+/5 as to improve functional strength for independent gait, increased standing tolerance and increased ADL ability. Baseline: 2/21 :see above 5/15: see above  Goal status:  In progress   6.  Patient will increase Berg Balance score by > 6 points (51/56) to demonstrate decreased fall risk during functional activities. Baseline:  5/15: 45 8/22: 41/56  Goal status: Ongoing 7.   Patient will increase UE strength to >4+/5 for ADLs, AD usage, and quality of life.  Baseline: 5/15: see above 8/22: see above Goal status: Partially Met    ASSESSMENT:  CLINICAL IMPRESSION:  Pt arrived to the clinic late with bike.  Pt reports he was able to ride the bike for the first time in a long time, but noted the wheels to be flat which made it more difficult.  Pt still displaying weakness in the LE's that was targeted during session and pt noting the exercises to be challenging.   Pt will continue to benefit from skilled therapy to address  remaining deficits in order to improve overall QoL and return to PLOF.     OBJECTIVE IMPAIRMENTS: Abnormal gait, decreased activity tolerance, decreased balance, decreased coordination, decreased endurance, decreased mobility, difficulty walking, decreased ROM, decreased strength, hypomobility, impaired flexibility, improper body mechanics, and postural dysfunction.   ACTIVITY LIMITATIONS: carrying, lifting, bending, standing, squatting, stairs, transfers, reach over head, locomotion level, and caring for others  PARTICIPATION LIMITATIONS: meal prep, cleaning, laundry, personal finances, driving, shopping, community activity, and yard work  PERSONAL FACTORS: Age, Past/current experiences, Time since onset of injury/illness/exacerbation, and 3+ comorbidities: DHD, arthritis, concussion COVID, HTN, multiple lacunar infarcts, MVP, pernicious anemia, rocky mountain spotted fever, spinal stenosis, vestibular migraine, vitamin B deficiency.   are also affecting patient's functional outcome.   REHAB POTENTIAL: Good  CLINICAL DECISION MAKING: Evolving/moderate complexity  EVALUATION COMPLEXITY: Moderate  PLAN:   PT FREQUENCY: 1x/week  PT DURATION: 12 weeks  PLANNED INTERVENTIONS: Therapeutic exercises, Therapeutic activity, Neuromuscular re-education, Balance training, Gait training, Patient/Family education, Self Care, Joint mobilization, Stair training, Vestibular training, Canalith repositioning, Visual/preceptual remediation/compensation, Orthotic/Fit training, DME instructions, Cognitive remediation, Electrical stimulation, Spinal mobilization, Cryotherapy, Moist heat, Splintting, Taping, Traction, Ultrasound, Manual therapy, and Re-evaluation  PLAN FOR NEXT SESSION:  Continue to progress UE strength w/ use of DB (pt has them at home) and balance for decreasing risk of falls. TENS for DF.     Nolon Bussing, PT, DPT Physical Therapist - [facility]   04/24/23, 1:31 PM

## 2023-04-25 ENCOUNTER — Encounter: Payer: Self-pay | Admitting: Neurosurgery

## 2023-04-25 ENCOUNTER — Ambulatory Visit: Payer: 59 | Admitting: Neurosurgery

## 2023-04-25 ENCOUNTER — Ambulatory Visit
Admission: RE | Admit: 2023-04-25 | Discharge: 2023-04-25 | Disposition: A | Discharge status: Home / Self Care | Payer: 59 | Attending: Neurosurgery | Admitting: Neurosurgery

## 2023-04-25 ENCOUNTER — Ambulatory Visit
Admission: RE | Admit: 2023-04-25 | Discharge: 2023-04-25 | Disposition: A | Discharge status: Home / Self Care | Payer: 59 | Source: Ambulatory Visit | Attending: Neurosurgery | Admitting: Neurosurgery

## 2023-04-25 VITALS — BP 117/72 | Ht 67.0 in | Wt 149.0 lb

## 2023-04-25 DIAGNOSIS — G959 Disease of spinal cord, unspecified: Secondary | ICD-10-CM

## 2023-04-25 DIAGNOSIS — Z981 Arthrodesis status: Secondary | ICD-10-CM | POA: Diagnosis not present

## 2023-04-25 DIAGNOSIS — Z09 Encounter for follow-up examination after completed treatment for conditions other than malignant neoplasm: Secondary | ICD-10-CM

## 2023-04-25 DIAGNOSIS — M4322 Fusion of spine, cervical region: Secondary | ICD-10-CM | POA: Diagnosis not present

## 2023-04-25 NOTE — Progress Notes (Signed)
   REFERRING PHYSICIAN:  Larae Grooms, Np [address] [place],  [place] [address]  DOS: 07/30/22  ACDF C4-C7           08/01/22  Exploration of anterior neck wound  HISTORY OF PRESENT ILLNESS: SELVIN GRANEY is status post ACDF C4-C7.  His strength is much improved.  His left foot drop has improved.  His wife reports that he has had multiple falls.  Additionally, he is having some issues with cognition and word production.  His balance continues to be an issue.  He is now walking with 2 canes.  PHYSICAL EXAMINATION:  NEUROLOGICAL:  General: In no acute distress.   Awake, alert, oriented to person, place, and time.  Pupils equal round and reactive to light.  Facial tone is symmetric.    Strength: Side Biceps Triceps Deltoid Interossei Grip Wrist Ext. Wrist Flex.  R 5 5 5 5 5 5 5   L 5 5 5 5 5 5 5    Incision c/d/I, minimal bruising.   Imaging:  No complications noted  Assessment / Plan: GAIGE BEHRMANN is doing well s/p above surgeries.  I am very pleased with his response to surgery.  He is having some issues with his balance in an ongoing fashion.  He additionally is had some trouble with word production.  I would like to contact his neurologist to expedite his follow-up.  I spent a total of 15 minutes in this patient's care today. This time was spent reviewing pertinent records including imaging studies, obtaining and confirming history, performing a directed evaluation, formulating and discussing my recommendations, and documenting the visit within the medical record.    Venetia Night MD Dept of Neurosurgery

## 2023-04-25 NOTE — Progress Notes (Signed)
   REFERRING PHYSICIAN:  Larae Grooms, Np [address] [place],  [place] [address]  DOS: 07/30/22  ACDF C4-C7           08/01/22  Exploration of anterior neck wound  HISTORY OF PRESENT ILLNESS: [name] is status post ACDF C4-C7.   He is doing very well.  He has minimal pain.  He has had some back pain.  It is not currently activity limiting.  He feels stronger than he did prior to surgery.  PHYSICAL EXAMINATION:  Vitals:   04/25/23 1328  BP: 117/72    NEUROLOGICAL:  General: In no acute distress.   Awake, alert, oriented to person, place, and time.  Pupils equal round and reactive to light.  Facial tone is symmetric.    Strength: Side Biceps Triceps Deltoid Interossei Grip Wrist Ext. Wrist Flex.  R 5 5 5 5 5 5 5   L 5 5 5 5 5 5 5    Incision c/d/I, minimal bruising.   Imaging:  No complications noted  Assessment / Plan: [name] is doing well s/p above surgeries.  I am very pleased with his response to surgery.  He is off activity limitations.  I will see him back in 6 months with x-rays.  He does have a pars defect at L5.  If he develops symptoms from this, I will be happy to start him on conservative management.     Venetia Night MD Dept of Neurosurgery

## 2023-04-29 DIAGNOSIS — F902 Attention-deficit hyperactivity disorder, combined type: Secondary | ICD-10-CM | POA: Diagnosis not present

## 2023-05-01 ENCOUNTER — Other Ambulatory Visit: Payer: Self-pay | Admitting: Nurse Practitioner

## 2023-05-02 ENCOUNTER — Ambulatory Visit: Payer: 59

## 2023-05-02 NOTE — Telephone Encounter (Signed)
Requested Prescriptions  Pending Prescriptions Disp Refills   lisinopril (ZESTRIL) 20 MG tablet [Pharmacy Med Name: LISINOPRIL 20 MG TABLET] 30 tablet 2    Sig: TAKE 1 TABLET BY MOUTH EVERY DAY     Cardiovascular:  ACE Inhibitors Passed - 05/01/2023  1:30 AM      Passed - Cr in normal range and within 180 days    Creatinine  Date Value Ref Range Status  11/13/2011 0.70 0.60 - 1.30 mg/dL Final   Creatinine, Ser  Date Value Ref Range Status  01/08/2023 0.99 0.76 - 1.27 mg/dL Final         Passed - K in normal range and within 180 days    Potassium  Date Value Ref Range Status  01/08/2023 4.7 3.5 - 5.2 mmol/L Final  11/13/2011 4.1 3.5 - 5.1 mmol/L Final         Passed - Patient is not pregnant      Passed - Last BP in normal range    BP Readings from Last 1 Encounters:  04/25/23 117/72         Passed - Valid encounter within last 6 months    Recent Outpatient Visits           3 months ago Essential hypertension   [place] [facility] Larae Grooms, NP   4 months ago Viral upper respiratory tract infection   [place] [facility] Larae Grooms, NP   6 months ago Essential hypertension   [place] [facility] Larae Grooms, NP   7 months ago Essential hypertension   [place] [facility] Larae Grooms, NP   9 months ago Essential hypertension   [place] [facility] Larae Grooms, NP       Future Appointments             In 2 months Larae Grooms, NP [facility]

## 2023-05-09 ENCOUNTER — Ambulatory Visit: Payer: 59

## 2023-05-09 DIAGNOSIS — R634 Abnormal weight loss: Secondary | ICD-10-CM | POA: Diagnosis not present

## 2023-05-09 DIAGNOSIS — Z9889 Other specified postprocedural states: Secondary | ICD-10-CM | POA: Diagnosis not present

## 2023-05-09 DIAGNOSIS — G43119 Migraine with aura, intractable, without status migrainosus: Secondary | ICD-10-CM | POA: Diagnosis not present

## 2023-05-09 DIAGNOSIS — G629 Polyneuropathy, unspecified: Secondary | ICD-10-CM | POA: Diagnosis not present

## 2023-05-09 DIAGNOSIS — G250 Essential tremor: Secondary | ICD-10-CM | POA: Diagnosis not present

## 2023-05-09 DIAGNOSIS — E569 Vitamin deficiency, unspecified: Secondary | ICD-10-CM | POA: Diagnosis not present

## 2023-05-09 DIAGNOSIS — F902 Attention-deficit hyperactivity disorder, combined type: Secondary | ICD-10-CM | POA: Diagnosis not present

## 2023-05-09 DIAGNOSIS — Z8673 Personal history of transient ischemic attack (TIA), and cerebral infarction without residual deficits: Secondary | ICD-10-CM | POA: Diagnosis not present

## 2023-05-14 ENCOUNTER — Ambulatory Visit (INDEPENDENT_AMBULATORY_CARE_PROVIDER_SITE_OTHER): Payer: 59

## 2023-05-14 ENCOUNTER — Telehealth: Payer: Self-pay

## 2023-05-14 DIAGNOSIS — E538 Deficiency of other specified B group vitamins: Secondary | ICD-10-CM | POA: Diagnosis not present

## 2023-05-14 MED ORDER — CYANOCOBALAMIN 1000 MCG/ML IJ SOLN
1000.0000 ug | Freq: Once | INTRAMUSCULAR | Status: AC
Start: 2023-05-14 — End: 2023-05-14
  Administered 2023-05-14: 1000 ug via INTRAMUSCULAR

## 2023-05-14 NOTE — Telephone Encounter (Signed)
Spoke to Dr. Kieth Brightly and he states that this is a long process and it would be months before completed and the patient was told when scheduling the appts.  Patient wife has been notified.

## 2023-05-14 NOTE — Telephone Encounter (Signed)
Patient wife called stating she has called at least a dozen times trying to get results from testing done in June so he can continue his care with Neurologist.

## 2023-05-16 ENCOUNTER — Ambulatory Visit: Payer: 59

## 2023-05-19 ENCOUNTER — Ambulatory Visit
Admission: EM | Admit: 2023-05-19 | Discharge: 2023-05-19 | Disposition: A | Discharge status: Home / Self Care | Payer: 59

## 2023-05-19 ENCOUNTER — Encounter: Payer: Self-pay | Admitting: *Deleted

## 2023-05-19 ENCOUNTER — Ambulatory Visit (INDEPENDENT_AMBULATORY_CARE_PROVIDER_SITE_OTHER): Payer: 59

## 2023-05-19 DIAGNOSIS — S63502A Unspecified sprain of left wrist, initial encounter: Secondary | ICD-10-CM

## 2023-05-19 DIAGNOSIS — M7989 Other specified soft tissue disorders: Secondary | ICD-10-CM | POA: Diagnosis not present

## 2023-05-19 DIAGNOSIS — M25532 Pain in left wrist: Secondary | ICD-10-CM

## 2023-05-19 NOTE — ED Provider Notes (Signed)
[facility]    CSN: [id number] Arrival date & time: 05/19/23  1329      History   Chief Complaint Chief Complaint  Patient presents with   Wrist Pain    HPI [name] is a 62 y.o. male presenting with his wife for left wrist pain and swelling of the dorsal region for the past 1 week.  Patient reports he was crouched on a desk.  States the desk started to fall and he went to grab the desk.  States it was heavier than he thought it was and he was not able to pick it up.  He is prone to falls and has had multiple falls.  He has been occasionally applying ice to his wrist and has been taking high doses of aspirin at home but states symptoms have not really gotten any better and he wants to make sure nothing serious is wrong with this wrist.  Afraid if he falls again it could cause him more damage to the area that is already injured.  He is right-handed.  He has no other injuries or complaints.  HPI  Past Medical History:  Diagnosis Date   ADHD    Arthritis    Concussion    COVID 12/2020   Hypertension    Multiple lacunar infarcts (HCC)    MVP (mitral valve prolapse)    Pernicious anemia    [facility] spotted fever    Spinal stenosis    Vestibular migraine    Vitamin B12 deficiency    Vitamin D deficiency     Patient Active Problem List   Diagnosis Date Noted   Postoperative hematoma of musculoskeletal structure following musculoskeletal procedure 08/01/2022   Cervical myelopathy (HCC) 07/30/2022   Cervical spinal stenosis 07/30/2022   Radiculopathy, cervical 07/30/2022   S/P cervical spinal fusion 07/30/2022   Vitamin D deficiency 06/25/2022   B12 deficiency 06/25/2022   Elevated LDL cholesterol level 06/25/2022   Prediabetes 03/27/2021   History of nausea- reported 01/27/2020 02/02/2020   Accidental fall from ladder 02/02/2020   Acute right-sided thoracic back pain 02/02/2020   Neck stiffness 02/02/2020   Concussion with no loss of consciousness  02/02/2020   Multiple falls 02/02/2020   Head trauma, initial encounter 02/02/2020   Acute post-traumatic headache, not intractable 02/02/2020   Essential hypertension 08/26/2018   Pre-bariatric surgery nutrition evaluation 06/19/2018   Migraine with vertigo 01/05/2014   Arthralgia of multiple joints 04/28/2009   Carpal tunnel syndrome 08/30/2008    Past Surgical History:  Procedure Laterality Date   ANTERIOR CERVICAL DECOMP/DISCECTOMY FUSION N/A 07/30/2022   Procedure: C4-7 ANTERIOR CERVICAL DISCECTOMY AND FUSION (GLOBUS HEDRON);  Surgeon: Venetia Night, MD;  Location: [facility] ORS;  Service: Neurosurgery;  Laterality: N/A;   BILATERAL CARPAL TUNNEL RELEASE     CERVICAL WOUND DEBRIDEMENT N/A 08/01/2022   Procedure: CERVICAL WOUND DEBRIDEMENT OF HEMATOMA;  Surgeon: Venetia Night, MD;  Location: [facility] OR[facility];  Service: Neurosurgery;  Laterality: N/A;   FRACTURE SURGERY Left 1984   intramedullary rod   hip bone transplant  1984   LEG SURGERY Left    oseotomy   REPAIR ANKLE LIGAMENT Right 1978   REPAIR KNEE LIGAMENT Bilateral    acl   TRIGGER FINGER RELEASE Bilateral    thumbs       Home Medications    Prior to Admission medications   Medication Sig Start Date End Date Taking? Authorizing Provider  amphetamine-dextroamphetamine (ADDERALL XR) 10 MG 24 hr capsule Take by mouth.  04/29/23 07/28/23 Yes [provider]  aspirin EC 81 MG tablet Take 81 mg by mouth daily. Swallow whole.    [provider]  Cholecalciferol (VITAMIN D3 PO) Take 5,000 Int'l Units by mouth daily.    [provider]  Cyanocobalamin (VITAMIN B 12 PO) Take 800 mcg by mouth daily. W/ folate 1700 mcg    [provider]  diphenhydrAMINE (BENADRYL) 25 mg capsule Take 50 mg by mouth at bedtime as needed.    [provider]  docusate sodium (COLACE) 100 MG capsule Take 100 mg by mouth 2 (two) times daily.    [provider]  lisinopril (ZESTRIL) 20 MG tablet  TAKE 1 TABLET BY MOUTH EVERY DAY 05/02/23   Larae Grooms, NP  rizatriptan (MAXALT) 10 MG tablet Take 1 tablet (10 mg total) by mouth as needed for migraine. May repeat in 2 hours if needed 08/02/21   Larae Grooms, NP    Family History Family History  Problem Relation Age of Onset   Breast cancer Mother    Thyroid cancer Mother    Lymphoma Father    CVA Father    Heart attack Sister    Hypertension Sister     Social History Social History   Tobacco Use   Smoking status: Never   Smokeless tobacco: Never  Vaping Use   Vaping status: Never Used  Substance Use Topics   Alcohol use: Not Currently   Drug use: No     Allergies   Patient has no known allergies.   Review of Systems Review of Systems  Musculoskeletal:  Positive for arthralgias and joint swelling.  Skin:  Negative for color change and wound.  Neurological:  Negative for weakness and numbness.     Physical Exam Triage Vital Signs ED Triage Vitals  Encounter Vitals Group     BP      Systolic BP Percentile      Diastolic BP Percentile      Pulse      Resp      Temp      Temp src      SpO2      Weight      Height      Head Circumference      Peak Flow      Pain Score      Pain Loc      Pain Education      Exclude from Growth Chart    No data found.  Updated Vital Signs BP 125/86 (BP Location: Left Arm)   Pulse 90   Temp 98.3 F (36.8 C) (Oral)   Resp 16   Ht 5\' 7"  (1.702 m)   Wt 140 lb (63.5 kg)   SpO2 100%   BMI 21.93 kg/m      Physical Exam Vitals and nursing note reviewed.  Constitutional:      General: He is not in acute distress.    Appearance: Normal appearance. He is well-developed. He is not ill-appearing.  HENT:     Head: Normocephalic and atraumatic.  Eyes:     General: No scleral icterus.    Conjunctiva/sclera: Conjunctivae normal.  Cardiovascular:     Rate and Rhythm: Normal rate.     Pulses: Normal pulses.  Pulmonary:     Effort: Pulmonary effort is  normal. No respiratory distress.  Musculoskeletal:     Left wrist: Swelling (mild/moderate with small contusion dorsal mid/radial wrist) and tenderness (distal radius, DRUJ) present. Decreased range of motion (  Painful ROM in all directions especially extension). Normal pulse.     Cervical back: Neck supple.  Skin:    General: Skin is warm and dry.     Capillary Refill: Capillary refill takes less than 2 seconds.  Neurological:     General: No focal deficit present.     Mental Status: He is alert. Mental status is at baseline.     Motor: No weakness.     Gait: Gait normal.  Psychiatric:        Mood and Affect: Mood normal.        Behavior: Behavior normal.      UC Treatments / Results  Labs (all labs ordered are listed, but only abnormal results are displayed) Labs Reviewed - No data to display  EKG   Radiology DG Wrist Complete Left  Result Date: 05/19/2023 CLINICAL DATA:  Patient states he was lifting a desk a week ago and heavier than he expected, denies a fall but since then left wrist swelling/pain. Wearing home brace w/ some relief, took aspirin (5-325 at once twice since this started) pain EXAM: LEFT WRIST - COMPLETE 3+ VIEW COMPARISON:  None Available. FINDINGS: No distal radius or ulnar fracture. Radiocarpal joint is intact. No carpal fracture. No soft tissue abnormality. IMPRESSION: No fracture or dislocation. Electronically Signed   By: Genevive Bi M.D.   On: 05/19/2023 14:47    Procedures Procedures (including critical care time)  Medications Ordered in UC Medications - No data to display  Initial Impression / Assessment and Plan / UC Course  I have reviewed the triage vital signs and the nursing notes.  Pertinent labs & imaging results that were available during my care of the patient were reviewed by me and considered in my medical decision making (see chart for details).   62 year old male presents for left wrist pain and swelling after he twisted it  while trying to catch a falling desk a week ago.  X-ray performed today shows no acute fracture, dislocation or abnormality.  Discussed results with patient and his wife.  Explained that his symptoms are most consistent with a sprain.  He was given a supportive wrist brace.  Reviewed increasing frequency of ice application.  Advised not to take as much aspirin.  May switch to Aleve and take Tylenol and use Voltaren gel.  Advised to avoid painful activities, heavy lifting.  Encouraged to follow-up with Ortho if no improvement in the next week.   Final Clinical Impressions(s) / UC Diagnoses   Final diagnoses:  Sprain of left wrist, initial encounter  Left wrist pain     Discharge Instructions      SPRAIN: X-rays are normal. Stressed avoiding painful activities . Reviewed RICE guidelines. Use medications as directed, including NSAIDs. If no NSAIDs have been prescribed for you today, you may take Aleve or Motrin over the counter. May use Tylenol in between doses of NSAIDs.  Can use Voltaren gel too. If no improvement in the next 1-2 weeks, f/u with PCP or ortho.  You may have a condition requiring you to follow up with Orthopedics so please call one of the following office for appointment:   [facility] Address: [address] [place], [place] [address] Phone: [phone]  [facility] [address], [place], [place] [address] Phone: [phone]  [facility] [address] [place], [place] [address] Phone: [phone]      ED Prescriptions   None    PDMP not reviewed this encounter.  Shirlee Latch, PA-C 05/19/23 1614

## 2023-05-19 NOTE — Discharge Instructions (Signed)
SPRAIN: X-rays are normal. Stressed avoiding painful activities . Reviewed RICE guidelines. Use medications as directed, including NSAIDs. If no NSAIDs have been prescribed for you today, you may take Aleve or Motrin over the counter. May use Tylenol in between doses of NSAIDs.  Can use Voltaren gel too. If no improvement in the next 1-2 weeks, f/u with PCP or ortho.  You may have a condition requiring you to follow up with Orthopedics so please call one of the following office for appointment:   [facility] Address: [address], [place], [place] [address] Phone: [phone]  [facility] [address] [place], [place] [address] Phone: [phone]  [facility] [address] [place], [place] [address] Phone: [phone]

## 2023-05-19 NOTE — ED Triage Notes (Signed)
Patient states he was lifting a desk a week ago and heavier than he expected, denies a fall but since then left wrist swelling/pain.  Wearing home brace w/ some relief, took aspirin (5-325 at once twice since this started)

## 2023-05-21 DIAGNOSIS — G309 Alzheimer's disease, unspecified: Secondary | ICD-10-CM | POA: Diagnosis not present

## 2023-05-21 DIAGNOSIS — G3184 Mild cognitive impairment, so stated: Secondary | ICD-10-CM | POA: Diagnosis not present

## 2023-05-22 ENCOUNTER — Encounter: Payer: 59 | Attending: Psychology | Admitting: Psychology

## 2023-05-22 ENCOUNTER — Encounter: Payer: Self-pay | Admitting: Psychology

## 2023-05-22 DIAGNOSIS — F02818 Dementia in other diseases classified elsewhere, unspecified severity, with other behavioral disturbance: Secondary | ICD-10-CM | POA: Diagnosis not present

## 2023-05-22 DIAGNOSIS — F840 Autistic disorder: Secondary | ICD-10-CM | POA: Insufficient documentation

## 2023-05-22 DIAGNOSIS — S069XAS Unspecified intracranial injury with loss of consciousness status unknown, sequela: Secondary | ICD-10-CM | POA: Diagnosis not present

## 2023-05-22 NOTE — Progress Notes (Signed)
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 variability within subtest making up this composite score.  On primary visual encoding components the patient did quite well producing a scaled score of 13 in the 85th percentile for spatial span subtest.  However, when asked to process that visual information in any way (spatial addition subtest) requiring not only storage of visual information but also manipulation of that information is working memory he had much greater relative difficulties.  While this performance was still at the lower end of the average range on this second subtest it is significantly different between his primary visual encoding component.  This is almost the exact same performance we saw with regard to auditory encoding and his capacity to actively process information in his auditory Register.  Breaking memory functions down between auditory versus visual memory the patient produced an auditory memory index score of 85 which falls at the 16th percentile and is in the low average range relative to a normative population.  This performance is  significantly below premorbid estimates and those that would be required for completing his educational studies and likely indicate significant area of deficits.  The patient produced a visual memory index score of 103 which falls at the 58th percentile and well below predicted levels of premorbid functioning they are still in the average range and more than 1 standard deviation better than his auditory memory component.  In-depth analysis of individual subtest show is stark contrast between his story learning versus his verbal paired associate learning.  The patient had considerable difficulty on story learning/logical memory subtest performing at the 1st percentile on the initial presentation and 5th percentile after period of delay.  The patient did much better particularly on the verbal paired Associates recall after delay subtest performing at the 84th percentile.  These are sharply different scores.  I suspect that the active process processing of information particularly with sequential processing and time related components are the primary etiological factors of his difficulties in story learning versus verbal paired learning that does not require at this time relationship component.  This would be consistent with a specific weaknesses he has on the auditory and visual encoding measures and would also be consistent with some of the difficulties he has displayed in other cognitive domains.  While slightly below predicted levels the patient generally performed well on visual memory and learning components as these individual visual memory components do not really require a temporal related basis of learning and memory.  Looking at immediate memory versus delayed memory the patient actually performed better on delayed memory relative to a normative population that immediate memory.  There was still consistent differences between auditory versus memory recall particularly on items requiring temporal  relationship but the information that did initially get stored was stable and remained available for later recall.  The patient also did well on recognition/cued recall.  This was true for both auditory and visual information.  The cued recall component of the auditory memory component loses a lot of the temporal sequencing requirement of the story learning initially.  The fact that the patient improved when it was isolated into individual task would be consistent with these deficits related to temporal learning and memory deficits.           Primary Subtest Scaled Score Summary       Subtest Domain Raw Score Scaled Score Percentile Rank  Logical Memory I AM 10 3 1   Logical Memory II AM 9 5 5   Verbal Paired Associates I AM 25 9 37  Verbal Paired Associates II AM 12 13 84  Designs I VM 58 9 37  Designs II VM 47 9 37  Visual Reproduction I VM 40 13 84  Visual Reproduction II VM 26 11 63  Spatial Addition VWM 9 8 25   Symbol Span VWM 27 13 84            Auditory Memory Process Score Summary      Process Score Raw Score Scaled Score Percentile Rank Cumulative Percentage (Base Rate)  LM II Recognition 24 - - 26-50%  VPA II Recognition 38 - - 26-50%         Visual Memory Process Score Summary      Process Score Raw Score Scaled Score Percentile Rank Cumulative Percentage (Base Rate)  DE I Content 27 6 9  -  DE I Spatial 13 8 25  -  DE II Content 33 10 50 -  DE II Spatial 10 9 37 -  DE II Recognition 14 - - 26-50%  VR II Recognition 7 - - >75%     Summary of Results:   The results of the current objective neuropsychological assessment do suggest that this is a valid administration and if the patient appeared to try his hardest and maintained appropriate engagement and effort throughout.  While globally, the patient did perform somewhat below predicted levels of premorbid  functioning he appeared to have generally well-maintained verbal base skills including expressive language capacities,  good vocabulary and general fund of information capacity and good verbal reasoning and visual reasoning and problem-solving capacities.  The patient's greatest individual weakness had to do with information processing speed but even that had to do with considerable variability in subtest with 1 subtest in this area performing quite well and another having great difficulty.  Memory test and also showed some isolated significant deficits.  Both auditory and visual encoding capacity when looking at primary encoding were quite good and consistent with likely lifelong premorbid functioning.  However, whenever he was asked to actively process information in his auditory or visual Register he performed much more poorly.  Looking at individual subtests where the patient did have difficulties we started to have significant patterns emerge.  While I will go into great detail regarding these individual components as they are spelled out thoroughly above the patient showed consistent patterns of sequencing and temporal dysfunction on a wide range of different cognitive components.  On subtest that required maintaining temporal relationships and sequencing and actively processing information he had significant difficulties.  This was the primary identified cognitive deficit noted across a wide range of different measures.  All measures that required the patient to maintain distinct time based sequencing he had much greater difficulties.  These are quite consistent with subjective reports of difficulties described by the patient and his in particular his wife as well as reflecting some of the difficulties he has had with maintaining consistent employment even though he had the intellectual and educational capacity.  This primary and focal deficit with regard to temporal/sequencing/relationship cognitive deficits emerges as a consistent pattern both on objective assessment as well as clinically relevant information and historical  information.  Impression/Diagnosis:   The results of the current neuropsychological evaluation do point towards very specific cognitive deficits being present.  While the patient's history and other features would be consistent with a lifelong mild autistic spectrum type of disorder represented in his obsessive-compulsive features and social interaction styles and sensory processing issues these have likely been lifelong patterns and in reality are likely not the primary difficulties that are continuing to cause difficulties both in his current relationship with his wife, capacity to effectively work through typical life endeavors including occupational situations etc.  The patient has exceptional cognitive abilities when simple rote memory and knowledge are required, many aspects of reasoning and problem-solving capacity, diligence and focus on particular areas enhanced by his OCD and motivation to learn and specific cognitive areas, and ability and capacity to learn simple straightforward facts.  However, a consistent pattern both with clinical information as well as neuropsychological information point to quite consistent finding of focal and primary deficits related to temporal dysfunction.  Higher cognitive assessment show good ability when there is not a requirement of temporal relationships to be accurately analyzed.  The patient has almost an excessive concern about life expectancy and tracking time keeping notes on a notepad around these issues.  Patient has had difficulty with some of the most basic daily activities in his home life that are also strongly associated with adequate temporal cognitive abilities.  The patient is showing findings that would be consistent with significant deficits in accurate time perception and these deficits superimposed upon an individual that is exceptionally bright and capable and storing just foundational knowledge are likely what have played a critical role in  longstanding interpersonal and behavioral conflicts with his wife and  difficulties in maintaining work performance and expectations with informal jobs.  Adequate temporal abilities are required and almost all daily activities and are essential for normal social functioning and out essentially primary way.  Simple task such as navigating your world, organizing daily activities etc. would all be severely impacted with the level of temporal and relationship deficits the patient displays on both formal testing as well as subjective difficulties both the patient and wife have described.  I suspect that these difficulties are then further exacerbated by the patient's obsessive-compulsive and mild autistic types of traits allowing the patient to get extremely fixated on individual tasks and have difficulty shifting between basic life demands.  These types of patterns can be exceptionally frustrating to others as instructions and request for actions become disjointed and incomplete with the hyperfixation on some task and appearance of abstinence, lack of concern etc. when in fact the individual is unable to effectively processed the time relationships and get lost on some task and fixated on others.  I have been suspect that some of the deficits displayed on the previous neuropsychological evaluation that when initially interpreted as reflecting invalid performance were potentially related to this temporal processing deficit itself.  As far as diagnostic considerations, given the fact that the patient has had multiple significant concussive/traumatic brain injury type events throughout his life.  Patient had alluded to an initial head trauma as early as age 12 and then multiple head traumas throughout childhood and adulthood, some of which with significant mechanical force.  I do think that 1 element is consistent with a diagnosis of a major neurocognitive disorder due to traumatic brain injury with specific and primary  deficits having to do with cognitive deficits and accurately sequencing and processing time relationships.  As some of these cognitive capacities are related to frontal lobe processing this would be consistent with a result of traumatic brain injury.  Identifying the time when this developed, it is almost impossible as he has had some many previous head traumas.  There is a resulting behavioral component to these cognitive deficits that are directly witnessed and experienced by his wife and likely previous employers.  I also suspect that the patient has an underlying mild autistic spectrum component that is separate from the results of his likely previous TBI's.  I even suspect that the early diagnosis of ADHD was an inaccurate diagnosis and the attempts to treat with methylphenidate and psychostimulant led to a significant side effect related to his obsessive-compulsive component.  Subsequent hospitalization was very traumatic and led to a very powerful attempt to avoid addressing any of these psychiatric or neurological features for fears that it would result in further trauma such as psychiatric involvement and potential hospitalization.  Essentially, there are 2 primary features that are intersecting with one another with 1 being a mild autistic spectrum disorder with significant OCD and the interplay with residual traumatic brain injury that has had very specific lingering cognitive deficits around temporal processing deficits and accurate sequencing and understanding relationships between events and actions with his temporal relationship deficits.  I do strongly advise that the patient continue to work on therapeutic interventions to allow himself a willingness to understand these underlying deficits with regard to temporal relationships at they have led to fixations on his lifespan and understanding his own personal life span, led to ongoing and significant conflicts between the patient and his wife and  have also been likely a primary component for difficulties maintaining consistent employment throughout his life.  The patient is extremely bright with regard to his capacity to understand specific factual information but the significant impacts of these temporal processing deficits on the real world settings have continued to cause a significant deleterious impact on his day-to-day real-world/life functioning.  There is no indication of any progressive degenerative condition at work year related to any specific progressive degenerative process and a focus on developing adaptive skills to cope with these temporal deficits and their impact on day-to-day life would likely have the best or most significant positive impact on his life.  As I noted the patient and in particular his wife have been concerned about how quickly and available I am for appointments I understand if they want to address these particular issues and discussed through them with his other therapist and neurologist.  With that noted, I would be happy to sit down with the patient and his wife if he desires and go over these results and talk in greater depth.  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 Clinical Neuropsychologist

## 2023-05-23 ENCOUNTER — Ambulatory Visit: Payer: 59

## 2023-05-27 ENCOUNTER — Ambulatory Visit: Payer: 59 | Admitting: Family Medicine

## 2023-05-28 ENCOUNTER — Ambulatory Visit (INDEPENDENT_AMBULATORY_CARE_PROVIDER_SITE_OTHER): Payer: 59 | Admitting: Nurse Practitioner

## 2023-05-28 ENCOUNTER — Encounter: Payer: Self-pay | Admitting: Nurse Practitioner

## 2023-05-28 VITALS — BP 118/76 | HR 68 | Temp 97.6°F | Wt 139.6 lb

## 2023-05-28 DIAGNOSIS — Z23 Encounter for immunization: Secondary | ICD-10-CM | POA: Diagnosis not present

## 2023-05-28 DIAGNOSIS — E559 Vitamin D deficiency, unspecified: Secondary | ICD-10-CM | POA: Diagnosis not present

## 2023-05-28 DIAGNOSIS — R634 Abnormal weight loss: Secondary | ICD-10-CM

## 2023-05-28 NOTE — Progress Notes (Signed)
BP 118/76   Pulse 68   Temp 97.6 F (36.4 C) (Oral)   Wt 139 lb 9.6 oz (63.3 kg)   SpO2 98%   BMI 21.86 kg/m    Subjective:    Patient ID: [name], male    DOB: March 13, 1961, 62 y.o.   MRN: [id number]  HPI: [name] is a 62 y.o. male  Chief Complaint  Patient presents with   Brain Injury   WEIGHT LOSS Patient presents to clinic today to discuss recent weight loss of 20lbs unintentionally.    Patient was started on adderall after the weight loss was noticed and has not seen any positive benefits with the medication and has no appetite.    Relevant past medical, surgical, family and social history reviewed and updated as indicated. Interim medical history since our last visit reviewed. Allergies and medications reviewed and updated.  Review of Systems  Constitutional:  Positive for appetite change and unexpected weight change.    Per HPI unless specifically indicated above     Objective:    BP 118/76   Pulse 68   Temp 97.6 F (36.4 C) (Oral)   Wt 139 lb 9.6 oz (63.3 kg)   SpO2 98%   BMI 21.86 kg/m   Wt Readings from Last 3 Encounters:  05/28/23 139 lb 9.6 oz (63.3 kg)  05/19/23 140 lb (63.5 kg)  04/25/23 149 lb (67.6 kg)    Physical Exam Vitals and nursing note reviewed.  Constitutional:      General: He is not in acute distress.    Appearance: Normal appearance. He is not ill-appearing, toxic-appearing or diaphoretic.  HENT:     Head: Normocephalic.     Right Ear: External ear normal.     Left Ear: External ear normal.     Nose: Nose normal. No congestion or rhinorrhea.     Mouth/Throat:     Mouth: Mucous membranes are moist.  Eyes:     General:        Right eye: No discharge.        Left eye: No discharge.     Extraocular Movements: Extraocular movements intact.     Conjunctiva/sclera: Conjunctivae normal.     Pupils: Pupils are equal, round, and reactive to light.  Cardiovascular:     Rate and Rhythm: Normal rate and regular  rhythm.     Heart sounds: No murmur heard. Pulmonary:     Effort: Pulmonary effort is normal. No respiratory distress.     Breath sounds: Normal breath sounds. No wheezing, rhonchi or rales.  Abdominal:     General: Abdomen is flat. Bowel sounds are normal.  Musculoskeletal:     Cervical back: Normal range of motion and neck supple.  Skin:    General: Skin is warm and dry.     Capillary Refill: Capillary refill takes less than 2 seconds.  Neurological:     General: No focal deficit present.     Mental Status: He is alert and oriented to person, place, and time.  Psychiatric:        Mood and Affect: Mood normal.        Behavior: Behavior normal.        Thought Content: Thought content normal.        Judgment: Judgment normal.     Results for orders placed or performed in visit on 01/08/23  B12  Result Value Ref Range   Vitamin B-12 1,038 232 - 1,245 pg/mL  HgB  A1c  Result Value Ref Range   Hgb A1c MFr Bld 6.1 (H) 4.8 - 5.6 %   Est. average glucose Bld gHb Est-mCnc 128 mg/dL  Lipid Profile  Result Value Ref Range   Cholesterol, Total 190 100 - 199 mg/dL   Triglycerides 308 0 - 149 mg/dL   HDL 68 >65 mg/dL   VLDL Cholesterol Cal 21 5 - 40 mg/dL   LDL Chol Calc (NIH) 784 (H) 0 - 99 mg/dL   Chol/HDL Ratio 2.8 0.0 - 5.0 ratio  Comp Met (CMET)  Result Value Ref Range   Glucose 90 70 - 99 mg/dL   BUN 12 8 - 27 mg/dL   Creatinine, Ser 6.96 0.76 - 1.27 mg/dL   eGFR 86 >29 BM/WUX/3.24   BUN/Creatinine Ratio 12 10 - 24   Sodium 139 134 - 144 mmol/L   Potassium 4.7 3.5 - 5.2 mmol/L   Chloride 101 96 - 106 mmol/L   CO2 25 20 - 29 mmol/L   Calcium 9.4 8.6 - 10.2 mg/dL   Total Protein 6.4 6.0 - 8.5 g/dL   Albumin 4.3 3.9 - 4.9 g/dL   Globulin, Total 2.1 1.5 - 4.5 g/dL   Albumin/Globulin Ratio 2.0 1.2 - 2.2   Bilirubin Total 1.1 0.0 - 1.2 mg/dL   Alkaline Phosphatase 82 44 - 121 IU/L   AST 19 0 - 40 IU/L   ALT 13 0 - 44 IU/L  Vitamin D (25 hydroxy)  Result Value Ref Range    Vit D, 25-Hydroxy 49.8 30.0 - 100.0 ng/mL      Assessment & Plan:   Problem List Items Addressed This Visit       Other   Vitamin D deficiency- Labs ordered at visit today. Will make recommendations based on lab results.    Relevant Orders   Vitamin D (25 hydroxy)   Other Visit Diagnoses     Unintentional weight loss    -  Primary   16lb weight loss in less than 6 months. Endorses eating lean protein and appetite has not changed outside of the two weeks he was taking adderall. Plan to stop Adderall due to decrease in appetite.  Thyroid labs checked at visit today.  Vitamin D also checked.  Referral placed for Nutritionist.  Recommend increasing protein intake.  Add protein shake with two meals per day. Follow up in 1 month.    Relevant Orders   Ambulatory Referral to DSME/T   Thyroid peroxidase antibody   Thyroid Panel With TSH   Need for influenza vaccination       Relevant Orders   Flu vaccine trivalent PF, 6mos and older(Flulaval,Afluria,Fluarix,Fluzone) (Completed)        Follow up plan: Return Keep appt for November.

## 2023-05-29 LAB — THYROID PEROXIDASE ANTIBODY: Thyroperoxidase Ab SerPl-aCnc: <9 [IU]/mL (ref 0–34)

## 2023-05-29 LAB — THYROID PANEL WITH TSH
Free Thyroxine Index: 2 (ref 1.2–4.9)
T3 Uptake Ratio: 24 % (ref 24–39)
T4, Total: 8.2 ug/dL (ref 4.5–12.0)
TSH: 0.973 u[IU]/mL (ref 0.450–4.500)

## 2023-05-29 LAB — VITAMIN D 25 HYDROXY (VIT D DEFICIENCY, FRACTURES): Vit D, 25-Hydroxy: 71.3 ng/mL (ref 30.0–100.0)

## 2023-05-29 NOTE — Progress Notes (Signed)
Good Morning.  It was nice to see you yesterday.  Your lab work looks good.  Thyroid labs are within normal range.  Vitamin D is also within normal range.  No concerns at this time. Continue with your current medication regimen.  Follow up as discussed.  Please let me know if you have any questions.

## 2023-05-30 ENCOUNTER — Ambulatory Visit: Payer: 59

## 2023-06-04 ENCOUNTER — Encounter (HOSPITAL_BASED_OUTPATIENT_CLINIC_OR_DEPARTMENT_OTHER): Payer: 59 | Admitting: Psychology

## 2023-06-04 DIAGNOSIS — F840 Autistic disorder: Secondary | ICD-10-CM

## 2023-06-04 DIAGNOSIS — R413 Other amnesia: Secondary | ICD-10-CM

## 2023-06-04 DIAGNOSIS — F02818 Dementia in other diseases classified elsewhere, unspecified severity, with other behavioral disturbance: Secondary | ICD-10-CM

## 2023-06-04 DIAGNOSIS — S069XAS Unspecified intracranial injury with loss of consciousness status unknown, sequela: Secondary | ICD-10-CM

## 2023-06-04 DIAGNOSIS — R41840 Attention and concentration deficit: Secondary | ICD-10-CM | POA: Diagnosis not present

## 2023-06-05 NOTE — Therapy (Signed)
OUTPATIENT PHYSICAL THERAPY NEURO TREATMENT/ Physical Therapy Progress Note   Dates of reporting period  01/02/23   to   06/06/23     Patient Name: [name] MRN: [id number] DOB:11/03/1960, 62 y.o., male Today's Date: 06/06/2023   PCP: Larae Grooms NP REFERRING PROVIDER: Susanne Borders PA  END OF SESSION:  PT End of Session - 06/06/23 1456     Visit Number 20    Number of Visits 29    Date for PT Re-Evaluation 07/04/23    Authorization Type eval 2/21    PT Start Time 1454    PT Stop Time 1529    PT Time Calculation (min) 35 min    Equipment Utilized During Treatment Gait belt    Activity Tolerance Patient tolerated treatment well    Behavior During Therapy [facility] for tasks assessed/performed                             Past Medical History:  Diagnosis Date   ADHD    Arthritis    Concussion    COVID 12/2020   Hypertension    Multiple lacunar infarcts (HCC)    MVP (mitral valve prolapse)    Pernicious anemia    [facility] spotted fever    Spinal stenosis    Vestibular migraine    Vitamin B12 deficiency    Vitamin D deficiency    Past Surgical History:  Procedure Laterality Date   ANTERIOR CERVICAL DECOMP/DISCECTOMY FUSION N/A 07/30/2022   Procedure: C4-7 ANTERIOR CERVICAL DISCECTOMY AND FUSION (GLOBUS HEDRON);  Surgeon: Venetia Night, MD;  Location: [facility] ORS;  Service: Neurosurgery;  Laterality: N/A;   BILATERAL CARPAL TUNNEL RELEASE     CERVICAL WOUND DEBRIDEMENT N/A 08/01/2022   Procedure: CERVICAL WOUND DEBRIDEMENT OF HEMATOMA;  Surgeon: Venetia Night, MD;  Location: [facility] ORS;  Service: Neurosurgery;  Laterality: N/A;   FRACTURE SURGERY Left 1984   intramedullary rod   hip bone transplant  1984   LEG SURGERY Left    oseotomy   REPAIR ANKLE LIGAMENT Right 1978   REPAIR KNEE LIGAMENT Bilateral    acl   TRIGGER FINGER RELEASE Bilateral    thumbs   Patient Active Problem List   Diagnosis Date Noted    Postoperative hematoma of musculoskeletal structure following musculoskeletal procedure 08/01/2022   Cervical myelopathy (HCC) 07/30/2022   Cervical spinal stenosis 07/30/2022   Radiculopathy, cervical 07/30/2022   S/P cervical spinal fusion 07/30/2022   Vitamin D deficiency 06/25/2022   B12 deficiency 06/25/2022   Elevated LDL cholesterol level 06/25/2022   Prediabetes 03/27/2021   History of nausea- reported 01/27/2020 02/02/2020   Accidental fall from ladder 02/02/2020   Acute right-sided thoracic back pain 02/02/2020   Neck stiffness 02/02/2020   Concussion with no loss of consciousness 02/02/2020   Multiple falls 02/02/2020   Head trauma, initial encounter 02/02/2020   Acute post-traumatic headache, not intractable 02/02/2020   Essential hypertension 08/26/2018   Pre-bariatric surgery nutrition evaluation 06/19/2018   Migraine with vertigo 01/05/2014   Arthralgia of multiple joints 04/28/2009   Carpal tunnel syndrome 08/30/2008    ONSET DATE: summer 2022  REFERRING DIAG: LE weakness and gait instability   THERAPY DIAG:  Muscle weakness (generalized)  Unsteadiness on feet  Difficulty in walking, not elsewhere classified  Rationale for Evaluation and Treatment: Rehabilitation  SUBJECTIVE:  SUBJECTIVE STATEMENT: Patient had an MRI, showed that he had history of strokes.  Patient has been absent from PT since last month.  Pt accompanied by: self  PERTINENT HISTORY:  Patient presents for LE weakness and gait instability. He is s/p ACDF C-7 on 07/30/22. Patient has a leg length discrepancy and limited L knee flexion since his 20's. Patient has a lot of atrophy and lower back pain, foot drop bilaterally.  First symptoms was stumbling up stairs, with LLE. Foot drop was improved after ACDF. Now  has numbness and tingling at the end of the day. Does have a few lesions in his pons per wife, on blood pressure medication now. PMH includes ADHD, arthritis, concussion COVID, HTN, multiple lacunar infarcts, MVP, pernicious anemia, rocky mountain spotted fever, spinal stenosis, vestibular migraine, vitamin B deficiency.   PAIN:  Are you having pain?  Denies pain   PRECAUTIONS: Other: cervical; no lifting more than 20 lb   WEIGHT BEARING RESTRICTIONS: No  FALLS: Has patient fallen in last 6 months? Yes. Number of falls multiple a week  LIVING ENVIRONMENT: Lives with: lives with their family Lives in: House/apartment Stairs: Yes: Internal: flight steps; is sleeping downstairs Has following equipment at home: Single point cane  PLOF: Independent, uses a walking stick  PATIENT GOALS: gait control of feet, strengthen legs, balance, atrophy in hand  OBJECTIVE:   DIAGNOSTIC FINDINGS:  09/11/22: No acute fracture. No spondylolisthesis. No compression deformities. Disc space narrowing C3-4 with marginal osteophytes consistent with degenerative disc disease. Postop changes ACDF C4-C7 with anatomic alignment. Normal prevertebral and cervicocranial soft tissues.   06/19/22: 1. L5 chronic pars defects with L5-S1 anterolisthesis and advanced disc degeneration causing marked biforaminal L5 impingement. 2. Degeneration and short pedicles causes compressive spinal stenosis from L1-2 to L3-4, most advanced at L2-3. Moderate bilateral foraminal narrowing at the same levels.   COGNITION: Overall cognitive status: Within functional limits for tasks assessed    COORDINATION: Limited LLE  MUSCLE LENGTH: Significant limitation L hamstring; 60 degree flexion deficit.   POSTURE:  weight shift to the R  LOWER EXTREMITY ROM:     L knee flexion contraction -60 contraction LOWER EXTREMITY MMT:    MMT Right Eval R 5/15 R 8/22 Left Eval L 5/15 L 8/22  Hip flexion 4 5 5 4  4+ 4  Hip extension         Hip abduction 4- 4 4 4- 4 4  Hip adduction 4- 4 4 4- 4- 4  Hip internal rotation        Hip external rotation        Knee flexion 4- 4 4 3  4- 4-  Knee extension 4 4+ 5 4 4  4-  Ankle dorsiflexion 3 3 3+ 2+ 2+ 2+  Ankle plantarflexion 3 3+ 4- 3 3+ 3+  Ankle inversion        Ankle eversion        (Blank rows = not tested)  01/02/23 Right 8/22: RECERT  10/17 PN Left 8/22: Recert LUE 10/17: PN   Shoulder Flexion 4 4+ 5 4- 4+ 4+  Shoulder Abduction 4- 4 4+ 4- 4 4+  Shoulder Extension 4- 4+ 4 4- 4   Shoulder adduction 4- 4  3+ 4 4+  Bicep  4 4+ 5 4 4+ 4  Triceps 4- 4- 5 3+ 4 4      BED MOBILITY:  Perform next session  TRANSFERS: Assistive device utilized: Single point cane  Sit to stand: CGA Stand to  sit: CGA Chair to chair: CGA   GAIT: Gait pattern: L knee limited extension, L foot drop, L foot eversion Distance walked: 1200 Assistive device utilized: Single point cane Level of assistance: CGA Comments: Patient has increased eversion and rolling of L foot, LLE leg length discrepancy.   FUNCTIONAL TESTS:  5 times sit to stand: 5.6 seconds with minor use of LLE no UE support Timed up and go (TUG): n/a 6 minute walk test: 1200 ft 10 meter walk test: 7 seconds BERG: 36/56   PATIENT SURVEYS:  FOTO 54  TODAY'S TREATMENT:                                                                                                                              DATE: 06/06/23 Unless otherwise stated, CGA was provided and gait belt donned in order to ensure pt safety Goals performed see below:  BERG: 28/56    PATIENT EDUCATION: Education details: goals, POC, educated on safety during updated exercises  Person educated: Patient and Spouse Education method: Explanation, Demonstration, Tactile cues, and Verbal cues Education comprehension: verbalized understanding, returned demonstration, verbal cues required, and tactile cues required  HOME EXERCISE PROGRAM: Access Code:  [email or web address] URL: [email or web address] Date: 10/11/2022 Prepared by: Precious Bard  Exercises - Seated Ankle Alphabet  - 1 x daily - 7 x weekly - 2 sets - 10 reps - 5 hold - Seated Heel Toe Raises  - 1 x daily - 7 x weekly - 2 sets - 10 reps - 5 hold - Standing March with Counter Support  - 1 x daily - 7 x weekly - 2 sets - 10 reps - 5 hold  Access Code: [email or web address] URL: [email or web address] Date: 10/30/2022 Prepared by: Precious Bard  Exercises - Seated Ankle Plantarflexion with Resistance  - 1 x daily - 7 x weekly - 2 sets - 10 reps - 5 hold - Seated Ankle Eversion with Resistance  - 1 x daily - 7 x weekly - 2 sets - 10 reps - 5 hold  Access Code: [email or web address] URL: [email or web address] Date: 11/14/2022 Prepared by: Precious Bard  Exercises - Seated Heel Toe Raises  - 1 x daily - 7 x weekly - 2 sets - 10 reps - 5 hold - Seated Hip Internal Rotation AROM  - 1 x daily - 7 x weekly - 2 sets - 10 reps - 5 hold - Bilateral Long Arc Quad  - 1 x daily - 7 x weekly - 2 sets - 10 reps - 5 hold - Foam Balance Tandem stance  - 1 x daily - 7 x weekly - 2 sets - 2 reps - 30 hold - Single Leg Balance on Foam  - 1 x daily - 7 x weekly - 2 sets - 30 reps - 2 hold - Seated Hip Flexion March with Ankle Weights  - 1 x daily - 7 x weekly - 2 sets - 10 reps - 5 hold - Standing Hip  Abduction with Ankle Weight  - 1 x daily - 7 x weekly - 2 sets - 10 reps - 5 hold - Standing Hip Extension with Ankle Weight  - 1 x daily - 7 x weekly - 2 sets - 10 reps - 5 hold - Standing Hip Flexion with Ankle Weight  - 1 x daily - 7 x weekly - 2 sets - 10 reps - 5 hold - Seated Long Arc Quad with Ankle Weight  - 1 x daily - 7 x weekly - 2 sets - 10 reps - 5 hold   GOALS: Goals reviewed with patient? Yes  SHORT TERM GOALS: Target date: 05/09/2023        Patient will be independent in home exercise program to improve strength/mobility for better functional independence with  ADLs. Baseline: 2/21: HEP give next session 5/15 : intermittent compliance 8/22: intermittent compliance due to illness  Goal status: progress    LONG TERM GOALS: Target date:07/04/2023        Patient will increase FOTO score to equal to or greater than  64%   to demonstrate statistically significant improvement in mobility and quality of life.  Baseline: 2/21: 54% 8/22: 52% 10/17: 57% Goal status: I On going   2.  Patient will increase Berg Balance score by > 6 points (42/56)to demonstrate decreased fall risk during functional activities. Baseline: 2/21: 36/56 5/15: 45   Goal status: MET  3.  Patient will increase six minute walk test distance to >1500 for progression to age norm community ambulator and improve gait ability Baseline: 2/21: 1200 ft with [facility] 5/15: 1300 ft with [facility]  8/22: 905 ft with [facility] 10/17: 1100 ft with [facility]  Goal status: In progress  4.  Patient will increase BLE gross strength to 4+/5 as to improve functional strength for independent gait, increased standing tolerance and increased ADL ability. Baseline: 2/21 :see above 5/15: see above 10/17: RLE 5/5 LLE 4/5 L ankle 3/5  Goal status:  In progress   6.  Patient will increase Berg Balance score by > 6 points (51/56) to demonstrate decreased fall risk during functional activities. Baseline:  5/15: 45 8/22: 41/56 10/17: 28 Goal status: Ongoing  7.   Patient will increase UE strength to >4+/5 for ADLs, AD usage, and quality of life.  Baseline: 5/15: see above 8/22: see above Goal status: Partially Met    ASSESSMENT:  CLINICAL IMPRESSION: Patient's condition has the potential to improve in response to therapy. Maximum improvement is yet to be obtained. The anticipated improvement is attainable and reasonable in a generally predictable time.  Patient has decreased balance but improved strength from compliance with HEP but limited therapy   Patient will benefit from skilled physical therapy to improve strength,  mobility, and stability for improved quality of life.   OBJECTIVE IMPAIRMENTS: Abnormal gait, decreased activity tolerance, decreased balance, decreased coordination, decreased endurance, decreased mobility, difficulty walking, decreased ROM, decreased strength, hypomobility, impaired flexibility, improper body mechanics, and postural dysfunction.   ACTIVITY LIMITATIONS: carrying, lifting, bending, standing, squatting, stairs, transfers, reach over head, locomotion level, and caring for others  PARTICIPATION LIMITATIONS: meal prep, cleaning, laundry, personal finances, driving, shopping, community activity, and yard work  PERSONAL FACTORS: Age, Past/current experiences, Time since onset of injury/illness/exacerbation, and 3+ comorbidities: DHD, arthritis, concussion COVID, HTN, multiple lacunar infarcts, MVP, pernicious anemia, rocky mountain spotted fever, spinal stenosis, vestibular migraine, vitamin B deficiency.   are also affecting patient's functional outcome.   REHAB POTENTIAL: Good  CLINICAL DECISION  MAKING: Evolving/moderate complexity  EVALUATION COMPLEXITY: Moderate  PLAN:   PT FREQUENCY: 1x/week  PT DURATION: 12 weeks  PLANNED INTERVENTIONS: Therapeutic exercises, Therapeutic activity, Neuromuscular re-education, Balance training, Gait training, Patient/Family education, Self Care, Joint mobilization, Stair training, Vestibular training, Canalith repositioning, Visual/preceptual remediation/compensation, Orthotic/Fit training, DME instructions, Cognitive remediation, Electrical stimulation, Spinal mobilization, Cryotherapy, Moist heat, Splintting, Taping, Traction, Ultrasound, Manual therapy, and Re-evaluation  PLAN FOR NEXT SESSION:  Continue to progress UE strength w/ use of DB (pt has them at home) and balance for decreasing risk of falls. TENS for DF.    Precious Bard, PT, DPT Physical Therapist - [facility]  Outpatient Physical Therapy-  [facility] [phone]

## 2023-06-06 ENCOUNTER — Ambulatory Visit: Payer: 59 | Attending: Neurosurgery

## 2023-06-06 DIAGNOSIS — M6281 Muscle weakness (generalized): Secondary | ICD-10-CM | POA: Diagnosis not present

## 2023-06-06 DIAGNOSIS — R2681 Unsteadiness on feet: Secondary | ICD-10-CM | POA: Insufficient documentation

## 2023-06-06 DIAGNOSIS — F840 Autistic disorder: Secondary | ICD-10-CM | POA: Diagnosis not present

## 2023-06-06 DIAGNOSIS — R262 Difficulty in walking, not elsewhere classified: Secondary | ICD-10-CM | POA: Insufficient documentation

## 2023-06-13 ENCOUNTER — Ambulatory Visit: Payer: 59

## 2023-06-13 DIAGNOSIS — R4681 Obsessive-compulsive behavior: Secondary | ICD-10-CM | POA: Diagnosis not present

## 2023-06-13 DIAGNOSIS — F0670 Mild neurocognitive disorder due to known physiological condition without behavioral disturbance: Secondary | ICD-10-CM | POA: Diagnosis not present

## 2023-06-13 DIAGNOSIS — F840 Autistic disorder: Secondary | ICD-10-CM | POA: Diagnosis not present

## 2023-06-18 NOTE — Therapy (Signed)
OUTPATIENT PHYSICAL THERAPY NEURO TREATMENT   Patient Name: [name] MRN: [id number] DOB:1960-11-12, 62 y.o., male Today's Date: 06/20/2023   PCP: Larae Grooms NP REFERRING PROVIDER: Susanne Borders PA  END OF SESSION:  PT End of Session - 06/20/23 1406     Visit Number 21    Number of Visits 29    Date for PT Re-Evaluation 07/04/23    Authorization Type eval 2/21    PT Start Time 1405    PT Stop Time 1444    PT Time Calculation (min) 39 min    Equipment Utilized During Treatment Gait belt    Activity Tolerance Patient tolerated treatment well    Behavior During Therapy WFL for tasks assessed/performed                              Past Medical History:  Diagnosis Date   ADHD    Arthritis    Concussion    COVID 12/2020   Hypertension    Multiple lacunar infarcts (HCC)    MVP (mitral valve prolapse)    Pernicious anemia    [facility] spotted fever    Spinal stenosis    Vestibular migraine    Vitamin B12 deficiency    Vitamin D deficiency    Past Surgical History:  Procedure Laterality Date   ANTERIOR CERVICAL DECOMP/DISCECTOMY FUSION N/A 07/30/2022   Procedure: C4-7 ANTERIOR CERVICAL DISCECTOMY AND FUSION (GLOBUS HEDRON);  Surgeon: Venetia Night, MD;  Location: [facility] ORS;  Service: Neurosurgery;  Laterality: N/A;   BILATERAL CARPAL TUNNEL RELEASE     CERVICAL WOUND DEBRIDEMENT N/A 08/01/2022   Procedure: CERVICAL WOUND DEBRIDEMENT OF HEMATOMA;  Surgeon: Venetia Night, MD;  Location: [facility] ORS;  Service: Neurosurgery;  Laterality: N/A;   FRACTURE SURGERY Left 1984   intramedullary rod   hip bone transplant  1984   LEG SURGERY Left    oseotomy   REPAIR ANKLE LIGAMENT Right 1978   REPAIR KNEE LIGAMENT Bilateral    acl   TRIGGER FINGER RELEASE Bilateral    thumbs   Patient Active Problem List   Diagnosis Date Noted   Postoperative hematoma of musculoskeletal structure following musculoskeletal procedure  08/01/2022   Cervical myelopathy (HCC) 07/30/2022   Cervical spinal stenosis 07/30/2022   Radiculopathy, cervical 07/30/2022   S/P cervical spinal fusion 07/30/2022   Vitamin D deficiency 06/25/2022   B12 deficiency 06/25/2022   Elevated LDL cholesterol level 06/25/2022   Prediabetes 03/27/2021   History of nausea- reported 01/27/2020 02/02/2020   Accidental fall from ladder 02/02/2020   Acute right-sided thoracic back pain 02/02/2020   Neck stiffness 02/02/2020   Concussion with no loss of consciousness 02/02/2020   Multiple falls 02/02/2020   Head trauma, initial encounter 02/02/2020   Acute post-traumatic headache, not intractable 02/02/2020   Essential hypertension 08/26/2018   Pre-bariatric surgery nutrition evaluation 06/19/2018   Migraine with vertigo 01/05/2014   Arthralgia of multiple joints 04/28/2009   Carpal tunnel syndrome 08/30/2008    ONSET DATE: summer 2022  REFERRING DIAG: LE weakness and gait instability   THERAPY DIAG:  Muscle weakness (generalized)  Unsteadiness on feet  Difficulty in walking, not elsewhere classified  Rationale for Evaluation and Treatment: Rehabilitation  SUBJECTIVE:  SUBJECTIVE STATEMENT: Patient took a paddle trip since last session. No pain reported. Reports one fall since last session.   Pt accompanied by: self  PERTINENT HISTORY:  Patient presents for LE weakness and gait instability. He is s/p ACDF C-7 on 07/30/22. Patient has a leg length discrepancy and limited L knee flexion since his 20's. Patient has a lot of atrophy and lower back pain, foot drop bilaterally.  First symptoms was stumbling up stairs, with LLE. Foot drop was improved after ACDF. Now has numbness and tingling at the end of the day. Does have a few lesions in his pons per wife,  on blood pressure medication now. PMH includes ADHD, arthritis, concussion COVID, HTN, multiple lacunar infarcts, MVP, pernicious anemia, rocky mountain spotted fever, spinal stenosis, vestibular migraine, vitamin B deficiency.   PAIN:  Are you having pain?  Denies pain   PRECAUTIONS: Other: cervical; no lifting more than 20 lb   WEIGHT BEARING RESTRICTIONS: No  FALLS: Has patient fallen in last 6 months? Yes. Number of falls multiple a week  LIVING ENVIRONMENT: Lives with: lives with their family Lives in: House/apartment Stairs: Yes: Internal: flight steps; is sleeping downstairs Has following equipment at home: Single point cane  PLOF: Independent, uses a walking stick  PATIENT GOALS: gait control of feet, strengthen legs, balance, atrophy in hand  OBJECTIVE:   DIAGNOSTIC FINDINGS:  09/11/22: No acute fracture. No spondylolisthesis. No compression deformities. Disc space narrowing C3-4 with marginal osteophytes consistent with degenerative disc disease. Postop changes ACDF C4-C7 with anatomic alignment. Normal prevertebral and cervicocranial soft tissues.   06/19/22: 1. L5 chronic pars defects with L5-S1 anterolisthesis and advanced disc degeneration causing marked biforaminal L5 impingement. 2. Degeneration and short pedicles causes compressive spinal stenosis from L1-2 to L3-4, most advanced at L2-3. Moderate bilateral foraminal narrowing at the same levels.   COGNITION: Overall cognitive status: Within functional limits for tasks assessed    COORDINATION: Limited LLE  MUSCLE LENGTH: Significant limitation L hamstring; 60 degree flexion deficit.   POSTURE:  weight shift to the R  LOWER EXTREMITY ROM:     L knee flexion contraction -60 contraction LOWER EXTREMITY MMT:    MMT Right Eval R 5/15 R 8/22 Left Eval L 5/15 L 8/22  Hip flexion 4 5 5 4  4+ 4  Hip extension        Hip abduction 4- 4 4 4- 4 4  Hip adduction 4- 4 4 4- 4- 4  Hip internal rotation         Hip external rotation        Knee flexion 4- 4 4 3  4- 4-  Knee extension 4 4+ 5 4 4  4-  Ankle dorsiflexion 3 3 3+ 2+ 2+ 2+  Ankle plantarflexion 3 3+ 4- 3 3+ 3+  Ankle inversion        Ankle eversion        (Blank rows = not tested)  01/02/23 Right 8/22: RECERT  10/17 PN Left 8/22: Recert LUE 10/17: PN   Shoulder Flexion 4 4+ 5 4- 4+ 4+  Shoulder Abduction 4- 4 4+ 4- 4 4+  Shoulder Extension 4- 4+ 4 4- 4   Shoulder adduction 4- 4  3+ 4 4+  Bicep  4 4+ 5 4 4+ 4  Triceps 4- 4- 5 3+ 4 4      BED MOBILITY:  Perform next session  TRANSFERS: Assistive device utilized: Single point cane  Sit to stand: CGA Stand to sit: CGA Chair  to chair: CGA   GAIT: Gait pattern: L knee limited extension, L foot drop, L foot eversion Distance walked: 1200 Assistive device utilized: Single point cane Level of assistance: CGA Comments: Patient has increased eversion and rolling of L foot, LLE leg length discrepancy.   FUNCTIONAL TESTS:  5 times sit to stand: 5.6 seconds with minor use of LLE no UE support Timed up and go (TUG): n/a 6 minute walk test: 1200 ft 10 meter walk test: 7 seconds BERG: 36/56   PATIENT SURVEYS:  FOTO 54  TODAY'S TREATMENT:                                                                                                                              DATE: 06/20/23 Unless otherwise stated, CGA was provided and gait belt donned in order to ensure pt safety  TherEx 6" step toe taps with UE support ;10x each LE 6" step: lateral step ups 10x each side with BUE support  3lb ankle weights: ambulate 300 ft ; cues for heel toe strike and foot clearance   3lb ankle weights; -seated march 15x each LE  -soccer ball kicks x 4 minutes  Slider inversion/eversion 15x   Neuro Re-ed Standing with CGA next to support surface:  Airex pad: static stand 30 seconds x 2 trials, noticeable trembling of ankles/LE's with fatigue and challenge to maintain stability; second set with  eyes closed  Airex pad: horizontal head turns 30 seconds scanning room 10x ; cueing for arc of motion  Airex pad: vertical head turns 30 seconds, cueing for arc of motion, noticeable sway with upward gaze increasing demand on ankle righting reaction musculature Airex pad: one foot on 6" step one foot on airex pad, hold position for 30 seconds, switch legs, 2x each LE; Airex pad RTB row 15x    PATIENT EDUCATION: Education details: goals, POC, educated on safety during updated exercises  Person educated: Patient and Spouse Education method: Explanation, Demonstration, Tactile cues, and Verbal cues Education comprehension: verbalized understanding, returned demonstration, verbal cues required, and tactile cues required  HOME EXERCISE PROGRAM: Access Code: [email or web address] URL: [email or web address] Date: 10/11/2022 Prepared by: Precious Bard  Exercises - Seated Ankle Alphabet  - 1 x daily - 7 x weekly - 2 sets - 10 reps - 5 hold - Seated Heel Toe Raises  - 1 x daily - 7 x weekly - 2 sets - 10 reps - 5 hold - Standing March with Counter Support  - 1 x daily - 7 x weekly - 2 sets - 10 reps - 5 hold  Access Code: [email or web address] URL: [email or web address] Date: 10/30/2022 Prepared by: Precious Bard  Exercises - Seated Ankle Plantarflexion with Resistance  - 1 x daily - 7 x weekly - 2 sets - 10 reps - 5 hold - Seated Ankle Eversion with Resistance  - 1 x daily - 7 x weekly - 2 sets - 10 reps - 5 hold  Access Code: [email or web address]  URL: [email or web address] Date: 11/14/2022 Prepared by: Precious Bard  Exercises - Seated Heel Toe Raises  - 1 x daily - 7 x weekly - 2 sets - 10 reps - 5 hold - Seated Hip Internal Rotation AROM  - 1 x daily - 7 x weekly - 2 sets - 10 reps - 5 hold - Bilateral Long Arc Quad  - 1 x daily - 7 x weekly - 2 sets - 10 reps - 5 hold - Foam Balance Tandem stance  - 1 x daily - 7 x weekly - 2 sets - 2 reps - 30 hold - Single Leg Balance on  Foam  - 1 x daily - 7 x weekly - 2 sets - 30 reps - 2 hold - Seated Hip Flexion March with Ankle Weights  - 1 x daily - 7 x weekly - 2 sets - 10 reps - 5 hold - Standing Hip Abduction with Ankle Weight  - 1 x daily - 7 x weekly - 2 sets - 10 reps - 5 hold - Standing Hip Extension with Ankle Weight  - 1 x daily - 7 x weekly - 2 sets - 10 reps - 5 hold - Standing Hip Flexion with Ankle Weight  - 1 x daily - 7 x weekly - 2 sets - 10 reps - 5 hold - Seated Long Arc Quad with Ankle Weight  - 1 x daily - 7 x weekly - 2 sets - 10 reps - 5 hold   GOALS: Goals reviewed with patient? Yes  SHORT TERM GOALS: Target date: 05/09/2023        Patient will be independent in home exercise program to improve strength/mobility for better functional independence with ADLs. Baseline: 2/21: HEP give next session 5/15 : intermittent compliance 8/22: intermittent compliance due to illness  Goal status: progress    LONG TERM GOALS: Target date:07/04/2023        Patient will increase FOTO score to equal to or greater than  64%   to demonstrate statistically significant improvement in mobility and quality of life.  Baseline: 2/21: 54% 8/22: 52% 10/17: 57% Goal status: I On going   2.  Patient will increase Berg Balance score by > 6 points (42/56)to demonstrate decreased fall risk during functional activities. Baseline: 2/21: 36/56 5/15: 45   Goal status: MET  3.  Patient will increase six minute walk test distance to >1500 for progression to age norm community ambulator and improve gait ability Baseline: 2/21: 1200 ft with [facility] 5/15: 1300 ft with [facility]  8/22: 905 ft with [facility] 10/17: 1100 ft with [facility]  Goal status: In progress  4.  Patient will increase BLE gross strength to 4+/5 as to improve functional strength for independent gait, increased standing tolerance and increased ADL ability. Baseline: 2/21 :see above 5/15: see above 10/17: RLE 5/5 LLE 4/5 L ankle 3/5  Goal status:  In progress   6.   Patient will increase Berg Balance score by > 6 points (51/56) to demonstrate decreased fall risk during functional activities. Baseline:  5/15: 45 8/22: 41/56 10/17: 28 Goal status: Ongoing  7.   Patient will increase UE strength to >4+/5 for ADLs, AD usage, and quality of life.  Baseline: 5/15: see above 8/22: see above Goal status: Partially Met    ASSESSMENT:  CLINICAL IMPRESSION: Patient bringing up discussion of potential discharge. Patient educated on steps to lead to discharge, verbalized agreement. Will discuss with wife about what he lacks at home. Patient tolerates  progressive stabilization and strengthening interventions well this session.   Patient will benefit from skilled physical therapy to improve strength, mobility, and stability for improved quality of life.   OBJECTIVE IMPAIRMENTS: Abnormal gait, decreased activity tolerance, decreased balance, decreased coordination, decreased endurance, decreased mobility, difficulty walking, decreased ROM, decreased strength, hypomobility, impaired flexibility, improper body mechanics, and postural dysfunction.   ACTIVITY LIMITATIONS: carrying, lifting, bending, standing, squatting, stairs, transfers, reach over head, locomotion level, and caring for others  PARTICIPATION LIMITATIONS: meal prep, cleaning, laundry, personal finances, driving, shopping, community activity, and yard work  PERSONAL FACTORS: Age, Past/current experiences, Time since onset of injury/illness/exacerbation, and 3+ comorbidities: DHD, arthritis, concussion COVID, HTN, multiple lacunar infarcts, MVP, pernicious anemia, rocky mountain spotted fever, spinal stenosis, vestibular migraine, vitamin B deficiency.   are also affecting patient's functional outcome.   REHAB POTENTIAL: Good  CLINICAL DECISION MAKING: Evolving/moderate complexity  EVALUATION COMPLEXITY: Moderate  PLAN:   PT FREQUENCY: 1x/week  PT DURATION: 12 weeks  PLANNED INTERVENTIONS:  Therapeutic exercises, Therapeutic activity, Neuromuscular re-education, Balance training, Gait training, Patient/Family education, Self Care, Joint mobilization, Stair training, Vestibular training, Canalith repositioning, Visual/preceptual remediation/compensation, Orthotic/Fit training, DME instructions, Cognitive remediation, Electrical stimulation, Spinal mobilization, Cryotherapy, Moist heat, Splintting, Taping, Traction, Ultrasound, Manual therapy, and Re-evaluation  PLAN FOR NEXT SESSION:  Continue to progress UE strength w/ use of DB (pt has them at home) and balance for decreasing risk of falls. TENS for DF.    Precious Bard, PT, DPT Physical Therapist - [facility]  Outpatient Physical Therapy- Main Campus [phone]

## 2023-06-20 ENCOUNTER — Ambulatory Visit: Payer: 59

## 2023-06-20 DIAGNOSIS — M6281 Muscle weakness (generalized): Secondary | ICD-10-CM | POA: Diagnosis not present

## 2023-06-20 DIAGNOSIS — R262 Difficulty in walking, not elsewhere classified: Secondary | ICD-10-CM

## 2023-06-20 DIAGNOSIS — R2681 Unsteadiness on feet: Secondary | ICD-10-CM | POA: Diagnosis not present

## 2023-06-20 DIAGNOSIS — R4681 Obsessive-compulsive behavior: Secondary | ICD-10-CM | POA: Diagnosis not present

## 2023-06-20 DIAGNOSIS — F0670 Mild neurocognitive disorder due to known physiological condition without behavioral disturbance: Secondary | ICD-10-CM | POA: Diagnosis not present

## 2023-06-20 DIAGNOSIS — F840 Autistic disorder: Secondary | ICD-10-CM | POA: Diagnosis not present

## 2023-06-25 DIAGNOSIS — F0670 Mild neurocognitive disorder due to known physiological condition without behavioral disturbance: Secondary | ICD-10-CM | POA: Diagnosis not present

## 2023-06-25 DIAGNOSIS — R4681 Obsessive-compulsive behavior: Secondary | ICD-10-CM | POA: Diagnosis not present

## 2023-06-25 DIAGNOSIS — F840 Autistic disorder: Secondary | ICD-10-CM | POA: Diagnosis not present

## 2023-06-26 DIAGNOSIS — R636 Underweight: Secondary | ICD-10-CM | POA: Diagnosis not present

## 2023-06-26 DIAGNOSIS — G252 Other specified forms of tremor: Secondary | ICD-10-CM | POA: Diagnosis not present

## 2023-06-26 DIAGNOSIS — I10 Essential (primary) hypertension: Secondary | ICD-10-CM | POA: Diagnosis not present

## 2023-06-26 DIAGNOSIS — R634 Abnormal weight loss: Secondary | ICD-10-CM | POA: Diagnosis not present

## 2023-06-26 DIAGNOSIS — I619 Nontraumatic intracerebral hemorrhage, unspecified: Secondary | ICD-10-CM | POA: Diagnosis not present

## 2023-06-26 DIAGNOSIS — R3912 Poor urinary stream: Secondary | ICD-10-CM | POA: Diagnosis not present

## 2023-06-26 DIAGNOSIS — G43109 Migraine with aura, not intractable, without status migrainosus: Secondary | ICD-10-CM | POA: Diagnosis not present

## 2023-06-26 DIAGNOSIS — R1013 Epigastric pain: Secondary | ICD-10-CM | POA: Diagnosis not present

## 2023-06-26 DIAGNOSIS — F902 Attention-deficit hyperactivity disorder, combined type: Secondary | ICD-10-CM | POA: Diagnosis not present

## 2023-06-26 DIAGNOSIS — R630 Anorexia: Secondary | ICD-10-CM | POA: Diagnosis not present

## 2023-06-26 DIAGNOSIS — M6258 Muscle wasting and atrophy, not elsewhere classified, other site: Secondary | ICD-10-CM | POA: Diagnosis not present

## 2023-06-26 NOTE — Therapy (Signed)
OUTPATIENT PHYSICAL THERAPY NEURO TREATMENT   Patient Name: [name] MRN: [id number] DOB:1960/11/05, 62 y.o., male Today's Date: 06/27/2023   PCP: Larae Grooms NP REFERRING PROVIDER: Susanne Borders PA  END OF SESSION:  PT End of Session - 06/27/23 1358     Visit Number 22    Number of Visits 29    Date for PT Re-Evaluation 07/04/23    Authorization Type eval 2/21    PT Start Time 1400    PT Stop Time 1444    PT Time Calculation (min) 44 min    Equipment Utilized During Treatment Gait belt    Activity Tolerance Patient tolerated treatment well    Behavior During Therapy WFL for tasks assessed/performed                               Past Medical History:  Diagnosis Date   ADHD    Arthritis    Concussion    COVID 12/2020   Hypertension    Multiple lacunar infarcts (HCC)    MVP (mitral valve prolapse)    Pernicious anemia    [facility] spotted fever    Spinal stenosis    Vestibular migraine    Vitamin B12 deficiency    Vitamin D deficiency    Past Surgical History:  Procedure Laterality Date   ANTERIOR CERVICAL DECOMP/DISCECTOMY FUSION N/A 07/30/2022   Procedure: C4-7 ANTERIOR CERVICAL DISCECTOMY AND FUSION (GLOBUS HEDRON);  Surgeon: Venetia Night, MD;  Location: [facility] ORS;  Service: Neurosurgery;  Laterality: N/A;   BILATERAL CARPAL TUNNEL RELEASE     CERVICAL WOUND DEBRIDEMENT N/A 08/01/2022   Procedure: CERVICAL WOUND DEBRIDEMENT OF HEMATOMA;  Surgeon: Venetia Night, MD;  Location: [facility] ORS;  Service: Neurosurgery;  Laterality: N/A;   FRACTURE SURGERY Left 1984   intramedullary rod   hip bone transplant  1984   LEG SURGERY Left    oseotomy   REPAIR ANKLE LIGAMENT Right 1978   REPAIR KNEE LIGAMENT Bilateral    acl   TRIGGER FINGER RELEASE Bilateral    thumbs   Patient Active Problem List   Diagnosis Date Noted   Postoperative hematoma of musculoskeletal structure following musculoskeletal procedure  08/01/2022   Cervical myelopathy (HCC) 07/30/2022   Cervical spinal stenosis 07/30/2022   Radiculopathy, cervical 07/30/2022   S/P cervical spinal fusion 07/30/2022   Vitamin D deficiency 06/25/2022   B12 deficiency 06/25/2022   Elevated LDL cholesterol level 06/25/2022   Prediabetes 03/27/2021   History of nausea- reported 01/27/2020 02/02/2020   Accidental fall from ladder 02/02/2020   Acute right-sided thoracic back pain 02/02/2020   Neck stiffness 02/02/2020   Concussion with no loss of consciousness 02/02/2020   Multiple falls 02/02/2020   Head trauma, initial encounter 02/02/2020   Acute post-traumatic headache, not intractable 02/02/2020   Essential hypertension 08/26/2018   Pre-bariatric surgery nutrition evaluation 06/19/2018   Migraine with vertigo 01/05/2014   Arthralgia of multiple joints 04/28/2009   Carpal tunnel syndrome 08/30/2008    ONSET DATE: summer 2022  REFERRING DIAG: LE weakness and gait instability   THERAPY DIAG:  Muscle weakness (generalized)  Unsteadiness on feet  Difficulty in walking, not elsewhere classified  Rationale for Evaluation and Treatment: Rehabilitation  SUBJECTIVE:  SUBJECTIVE STATEMENT: Patient aware that he is getting close to discharge. Patient is wearing 2.5 ankle weights.   Pt accompanied by: self  PERTINENT HISTORY:  Patient presents for LE weakness and gait instability. He is s/p ACDF C-7 on 07/30/22. Patient has a leg length discrepancy and limited L knee flexion since his 20's. Patient has a lot of atrophy and lower back pain, foot drop bilaterally.  First symptoms was stumbling up stairs, with LLE. Foot drop was improved after ACDF. Now has numbness and tingling at the end of the day. Does have a few lesions in his pons per wife, on blood  pressure medication now. PMH includes ADHD, arthritis, concussion COVID, HTN, multiple lacunar infarcts, MVP, pernicious anemia, rocky mountain spotted fever, spinal stenosis, vestibular migraine, vitamin B deficiency.   PAIN:  Are you having pain?  Denies pain   PRECAUTIONS: Other: cervical; no lifting more than 20 lb   WEIGHT BEARING RESTRICTIONS: No  FALLS: Has patient fallen in last 6 months? Yes. Number of falls multiple a week  LIVING ENVIRONMENT: Lives with: lives with their family Lives in: House/apartment Stairs: Yes: Internal: flight steps; is sleeping downstairs Has following equipment at home: Single point cane  PLOF: Independent, uses a walking stick  PATIENT GOALS: gait control of feet, strengthen legs, balance, atrophy in hand  OBJECTIVE:   DIAGNOSTIC FINDINGS:  09/11/22: No acute fracture. No spondylolisthesis. No compression deformities. Disc space narrowing C3-4 with marginal osteophytes consistent with degenerative disc disease. Postop changes ACDF C4-C7 with anatomic alignment. Normal prevertebral and cervicocranial soft tissues.   06/19/22: 1. L5 chronic pars defects with L5-S1 anterolisthesis and advanced disc degeneration causing marked biforaminal L5 impingement. 2. Degeneration and short pedicles causes compressive spinal stenosis from L1-2 to L3-4, most advanced at L2-3. Moderate bilateral foraminal narrowing at the same levels.   COGNITION: Overall cognitive status: Within functional limits for tasks assessed    COORDINATION: Limited LLE  MUSCLE LENGTH: Significant limitation L hamstring; 60 degree flexion deficit.   POSTURE:  weight shift to the R  LOWER EXTREMITY ROM:     L knee flexion contraction -60 contraction LOWER EXTREMITY MMT:    MMT Right Eval R 5/15 R 8/22 Left Eval L 5/15 L 8/22  Hip flexion 4 5 5 4  4+ 4  Hip extension        Hip abduction 4- 4 4 4- 4 4  Hip adduction 4- 4 4 4- 4- 4  Hip internal rotation        Hip  external rotation        Knee flexion 4- 4 4 3  4- 4-  Knee extension 4 4+ 5 4 4  4-  Ankle dorsiflexion 3 3 3+ 2+ 2+ 2+  Ankle plantarflexion 3 3+ 4- 3 3+ 3+  Ankle inversion        Ankle eversion        (Blank rows = not tested)  01/02/23 Right 8/22: RECERT  10/17 PN Left 8/22: Recert LUE 10/17: PN   Shoulder Flexion 4 4+ 5 4- 4+ 4+  Shoulder Abduction 4- 4 4+ 4- 4 4+  Shoulder Extension 4- 4+ 4 4- 4   Shoulder adduction 4- 4  3+ 4 4+  Bicep  4 4+ 5 4 4+ 4  Triceps 4- 4- 5 3+ 4 4      BED MOBILITY:  Perform next session  TRANSFERS: Assistive device utilized: Single point cane  Sit to stand: CGA Stand to sit: CGA Chair to chair:  CGA   GAIT: Gait pattern: L knee limited extension, L foot drop, L foot eversion Distance walked: 1200 Assistive device utilized: Single point cane Level of assistance: CGA Comments: Patient has increased eversion and rolling of L foot, LLE leg length discrepancy.   FUNCTIONAL TESTS:  5 times sit to stand: 5.6 seconds with minor use of LLE no UE support Timed up and go (TUG): n/a 6 minute walk test: 1200 ft 10 meter walk test: 7 seconds BERG: 36/56   PATIENT SURVEYS:  FOTO 54  TODAY'S TREATMENT:                                                                                                                              DATE: 06/27/23 Unless otherwise stated, CGA was provided and gait belt donned in order to ensure pt safety   2.5 ankle weights worn by patient from home.   TherEx 6" step forward step upwith UE support ;10x each LE 6" step: lateral step ups 10x each side with BUE support   Sit to stand holding KB (15lb) pressing at top of stand 10x RDL seated 15 LB KB ; 10; x 2 sets; progressed to 2 KB for additional 2 sets  Jimmye Norman carry 20 meters ; challenging to balance    Slider inversion/eversion 15x   Neuro Re-ed Standing with CGA next to support surface:  Airex balance beam:  -lateral step 6x length SUE support -static stand  60 seconds -reach and sort alphabet     PATIENT EDUCATION: Education details: goals, POC, educated on safety during updated exercises  Person educated: Patient and Spouse Education method: Explanation, Demonstration, Tactile cues, and Verbal cues Education comprehension: verbalized understanding, returned demonstration, verbal cues required, and tactile cues required  HOME EXERCISE PROGRAM: Access Code: [email or web address] URL: [email or web address] Date: 10/11/2022 Prepared by: Precious Bard  Exercises - Seated Ankle Alphabet  - 1 x daily - 7 x weekly - 2 sets - 10 reps - 5 hold - Seated Heel Toe Raises  - 1 x daily - 7 x weekly - 2 sets - 10 reps - 5 hold - Standing March with Counter Support  - 1 x daily - 7 x weekly - 2 sets - 10 reps - 5 hold  Access Code: [email or web address] URL: [email or web address] Date: 10/30/2022 Prepared by: Precious Bard  Exercises - Seated Ankle Plantarflexion with Resistance  - 1 x daily - 7 x weekly - 2 sets - 10 reps - 5 hold - Seated Ankle Eversion with Resistance  - 1 x daily - 7 x weekly - 2 sets - 10 reps - 5 hold  Access Code: [email or web address] URL: [email or web address] Date: 11/14/2022 Prepared by: Precious Bard  Exercises - Seated Heel Toe Raises  - 1 x daily - 7 x weekly - 2 sets - 10 reps - 5 hold - Seated Hip Internal Rotation AROM  - 1 x daily - 7 x weekly - 2 sets - 10 reps - 5  hold - Bilateral Long Arc Quad  - 1 x daily - 7 x weekly - 2 sets - 10 reps - 5 hold - Foam Balance Tandem stance  - 1 x daily - 7 x weekly - 2 sets - 2 reps - 30 hold - Single Leg Balance on Foam  - 1 x daily - 7 x weekly - 2 sets - 30 reps - 2 hold - Seated Hip Flexion March with Ankle Weights  - 1 x daily - 7 x weekly - 2 sets - 10 reps - 5 hold - Standing Hip Abduction with Ankle Weight  - 1 x daily - 7 x weekly - 2 sets - 10 reps - 5 hold - Standing Hip Extension with Ankle Weight  - 1 x daily - 7 x weekly - 2 sets - 10 reps - 5 hold -  Standing Hip Flexion with Ankle Weight  - 1 x daily - 7 x weekly - 2 sets - 10 reps - 5 hold - Seated Long Arc Quad with Ankle Weight  - 1 x daily - 7 x weekly - 2 sets - 10 reps - 5 hold   GOALS: Goals reviewed with patient? Yes  SHORT TERM GOALS: Target date: 05/09/2023   Patient will be independent in home exercise program to improve strength/mobility for better functional independence with ADLs. Baseline: 2/21: HEP give next session 5/15 : intermittent compliance 8/22: intermittent compliance due to illness  Goal status: progress    LONG TERM GOALS: Target date:07/04/2023   Patient will increase FOTO score to equal to or greater than  64%   to demonstrate statistically significant improvement in mobility and quality of life.  Baseline: 2/21: 54% 8/22: 52% 10/17: 57% Goal status: I On going   2.  Patient will increase Berg Balance score by > 6 points (42/56)to demonstrate decreased fall risk during functional activities. Baseline: 2/21: 36/56 5/15: 45   Goal status: MET  3.  Patient will increase six minute walk test distance to >1500 for progression to age norm community ambulator and improve gait ability Baseline: 2/21: 1200 ft with [facility] 5/15: 1300 ft with [facility]  8/22: 905 ft with [facility] 10/17: 1100 ft with [facility]  Goal status: In progress  4.  Patient will increase BLE gross strength to 4+/5 as to improve functional strength for independent gait, increased standing tolerance and increased ADL ability. Baseline: 2/21 :see above 5/15: see above 10/17: RLE 5/5 LLE 4/5 L ankle 3/5  Goal status:  In progress   6.  Patient will increase Berg Balance score by > 6 points (51/56) to demonstrate decreased fall risk during functional activities. Baseline:  5/15: 45 8/22: 41/56 10/17: 28 Goal status: Ongoing  7.   Patient will increase UE strength to >4+/5 for ADLs, AD usage, and quality of life.  Baseline: 5/15: see above 8/22: see above Goal status: Partially Met     ASSESSMENT:  CLINICAL IMPRESSION: Patient bringing up discussion of potential discharge Will discuss with his wife prior to next session. Weighted strengthening interventions challenging but tolerated well with intermittent cues for form. He is highly motivated throughout session.   Patient will benefit from skilled physical therapy to improve strength, mobility, and stability for improved quality of life.   OBJECTIVE IMPAIRMENTS: Abnormal gait, decreased activity tolerance, decreased balance, decreased coordination, decreased endurance, decreased mobility, difficulty walking, decreased ROM, decreased strength, hypomobility, impaired flexibility, improper body mechanics, and postural dysfunction.   ACTIVITY LIMITATIONS: carrying, lifting, bending, standing, squatting,  stairs, transfers, reach over head, locomotion level, and caring for others  PARTICIPATION LIMITATIONS: meal prep, cleaning, laundry, personal finances, driving, shopping, community activity, and yard work  PERSONAL FACTORS: Age, Past/current experiences, Time since onset of injury/illness/exacerbation, and 3+ comorbidities: DHD, arthritis, concussion COVID, HTN, multiple lacunar infarcts, MVP, pernicious anemia, rocky mountain spotted fever, spinal stenosis, vestibular migraine, vitamin B deficiency.   are also affecting patient's functional outcome.   REHAB POTENTIAL: Good  CLINICAL DECISION MAKING: Evolving/moderate complexity  EVALUATION COMPLEXITY: Moderate  PLAN:   PT FREQUENCY: 1x/week  PT DURATION: 12 weeks  PLANNED INTERVENTIONS: Therapeutic exercises, Therapeutic activity, Neuromuscular re-education, Balance training, Gait training, Patient/Family education, Self Care, Joint mobilization, Stair training, Vestibular training, Canalith repositioning, Visual/preceptual remediation/compensation, Orthotic/Fit training, DME instructions, Cognitive remediation, Electrical stimulation, Spinal mobilization, Cryotherapy,  Moist heat, Splintting, Taping, Traction, Ultrasound, Manual therapy, and Re-evaluation  PLAN FOR NEXT SESSION:  Continue to progress UE strength w/ use of DB (pt has them at home) and balance for decreasing risk of falls. TENS for DF.    Precious Bard, PT, DPT Physical Therapist - [place] [facility]  Outpatient Physical Therapy- [facility] [phone]

## 2023-06-27 ENCOUNTER — Ambulatory Visit: Payer: 59

## 2023-06-27 ENCOUNTER — Ambulatory Visit: Payer: 59 | Attending: Neurosurgery

## 2023-06-27 DIAGNOSIS — M6281 Muscle weakness (generalized): Secondary | ICD-10-CM | POA: Diagnosis not present

## 2023-06-27 DIAGNOSIS — R262 Difficulty in walking, not elsewhere classified: Secondary | ICD-10-CM | POA: Diagnosis not present

## 2023-06-27 DIAGNOSIS — R2681 Unsteadiness on feet: Secondary | ICD-10-CM | POA: Diagnosis not present

## 2023-06-28 DIAGNOSIS — G252 Other specified forms of tremor: Secondary | ICD-10-CM | POA: Diagnosis not present

## 2023-06-28 DIAGNOSIS — I10 Essential (primary) hypertension: Secondary | ICD-10-CM | POA: Diagnosis not present

## 2023-06-28 DIAGNOSIS — R634 Abnormal weight loss: Secondary | ICD-10-CM | POA: Diagnosis not present

## 2023-07-03 DIAGNOSIS — F840 Autistic disorder: Secondary | ICD-10-CM | POA: Diagnosis not present

## 2023-07-03 DIAGNOSIS — R4681 Obsessive-compulsive behavior: Secondary | ICD-10-CM | POA: Diagnosis not present

## 2023-07-03 DIAGNOSIS — F0670 Mild neurocognitive disorder due to known physiological condition without behavioral disturbance: Secondary | ICD-10-CM | POA: Diagnosis not present

## 2023-07-04 ENCOUNTER — Ambulatory Visit: Payer: 59

## 2023-07-04 DIAGNOSIS — R262 Difficulty in walking, not elsewhere classified: Secondary | ICD-10-CM

## 2023-07-04 DIAGNOSIS — M6281 Muscle weakness (generalized): Secondary | ICD-10-CM | POA: Diagnosis not present

## 2023-07-04 DIAGNOSIS — R2681 Unsteadiness on feet: Secondary | ICD-10-CM | POA: Diagnosis not present

## 2023-07-04 NOTE — Therapy (Signed)
OUTPATIENT PHYSICAL THERAPY NEURO TREATMENT/ RECERT   Patient Name: [name] MRN: [id number] DOB:1961-07-29, 62 y.o., male Today's Date: 07/04/2023   PCP: Larae Grooms NP REFERRING PROVIDER: Susanne Borders PA  END OF SESSION:  PT End of Session - 07/04/23 1401     Visit Number 23    Number of Visits 35    Date for PT Re-Evaluation 09/26/23    Authorization Type eval 2/21    PT Start Time 1400    PT Stop Time 1444    PT Time Calculation (min) 44 min    Equipment Utilized During Treatment Gait belt    Activity Tolerance Patient tolerated treatment well    Behavior During Therapy WFL for tasks assessed/performed                                Past Medical History:  Diagnosis Date   ADHD    Arthritis    Concussion    COVID 12/2020   Hypertension    Multiple lacunar infarcts (HCC)    MVP (mitral valve prolapse)    Pernicious anemia    [facility] spotted fever    Spinal stenosis    Vestibular migraine    Vitamin B12 deficiency    Vitamin D deficiency    Past Surgical History:  Procedure Laterality Date   ANTERIOR CERVICAL DECOMP/DISCECTOMY FUSION N/A 07/30/2022   Procedure: C4-7 ANTERIOR CERVICAL DISCECTOMY AND FUSION (GLOBUS HEDRON);  Surgeon: Venetia Night, MD;  Location: [facility] ORS;  Service: Neurosurgery;  Laterality: N/A;   BILATERAL CARPAL TUNNEL RELEASE     CERVICAL WOUND DEBRIDEMENT N/A 08/01/2022   Procedure: CERVICAL WOUND DEBRIDEMENT OF HEMATOMA;  Surgeon: Venetia Night, MD;  Location: [facility] ORS;  Service: Neurosurgery;  Laterality: N/A;   FRACTURE SURGERY Left 1984   intramedullary rod   hip bone transplant  1984   LEG SURGERY Left    oseotomy   REPAIR ANKLE LIGAMENT Right 1978   REPAIR KNEE LIGAMENT Bilateral    acl   TRIGGER FINGER RELEASE Bilateral    thumbs   Patient Active Problem List   Diagnosis Date Noted   Postoperative hematoma of musculoskeletal structure following musculoskeletal  procedure 08/01/2022   Cervical myelopathy (HCC) 07/30/2022   Cervical spinal stenosis 07/30/2022   Radiculopathy, cervical 07/30/2022   S/P cervical spinal fusion 07/30/2022   Vitamin D deficiency 06/25/2022   B12 deficiency 06/25/2022   Elevated LDL cholesterol level 06/25/2022   Prediabetes 03/27/2021   History of nausea- reported 01/27/2020 02/02/2020   Accidental fall from ladder 02/02/2020   Acute right-sided thoracic back pain 02/02/2020   Neck stiffness 02/02/2020   Concussion with no loss of consciousness 02/02/2020   Multiple falls 02/02/2020   Head trauma, initial encounter 02/02/2020   Acute post-traumatic headache, not intractable 02/02/2020   Essential hypertension 08/26/2018   Pre-bariatric surgery nutrition evaluation 06/19/2018   Migraine with vertigo 01/05/2014   Arthralgia of multiple joints 04/28/2009   Carpal tunnel syndrome 08/30/2008    ONSET DATE: summer 2022  REFERRING DIAG: LE weakness and gait instability   THERAPY DIAG:  Muscle weakness (generalized)  Unsteadiness on feet  Difficulty in walking, not elsewhere classified  Rationale for Evaluation and Treatment: Rehabilitation  SUBJECTIVE:  SUBJECTIVE STATEMENT: Patient's wife called reports patient falling weekly, patient reports he is not falling.   Pt accompanied by: self  PERTINENT HISTORY:  Patient presents for LE weakness and gait instability. He is s/p ACDF C-7 on 07/30/22. Patient has a leg length discrepancy and limited L knee flexion since his 20's. Patient has a lot of atrophy and lower back pain, foot drop bilaterally.  First symptoms was stumbling up stairs, with LLE. Foot drop was improved after ACDF. Now has numbness and tingling at the end of the day. Does have a few lesions in his pons per wife, on  blood pressure medication now. PMH includes ADHD, arthritis, concussion COVID, HTN, multiple lacunar infarcts, MVP, pernicious anemia, rocky mountain spotted fever, spinal stenosis, vestibular migraine, vitamin B deficiency.   PAIN:  Are you having pain?  Denies pain   PRECAUTIONS: Other: cervical; no lifting more than 20 lb   WEIGHT BEARING RESTRICTIONS: No  FALLS: Has patient fallen in last 6 months? Yes. Number of falls multiple a week  LIVING ENVIRONMENT: Lives with: lives with their family Lives in: House/apartment Stairs: Yes: Internal: flight steps; is sleeping downstairs Has following equipment at home: Single point cane  PLOF: Independent, uses a walking stick  PATIENT GOALS: gait control of feet, strengthen legs, balance, atrophy in hand  OBJECTIVE:   DIAGNOSTIC FINDINGS:  09/11/22: No acute fracture. No spondylolisthesis. No compression deformities. Disc space narrowing C3-4 with marginal osteophytes consistent with degenerative disc disease. Postop changes ACDF C4-C7 with anatomic alignment. Normal prevertebral and cervicocranial soft tissues.   06/19/22: 1. L5 chronic pars defects with L5-S1 anterolisthesis and advanced disc degeneration causing marked biforaminal L5 impingement. 2. Degeneration and short pedicles causes compressive spinal stenosis from L1-2 to L3-4, most advanced at L2-3. Moderate bilateral foraminal narrowing at the same levels.   COGNITION: Overall cognitive status: Within functional limits for tasks assessed    COORDINATION: Limited LLE  MUSCLE LENGTH: Significant limitation L hamstring; 60 degree flexion deficit.   POSTURE:  weight shift to the R  LOWER EXTREMITY ROM:     L knee flexion contraction -60 contraction LOWER EXTREMITY MMT:    MMT Right Eval R 5/15 R 8/22 Left Eval L 5/15 L 8/22  Hip flexion 4 5 5 4  4+ 4  Hip extension        Hip abduction 4- 4 4 4- 4 4  Hip adduction 4- 4 4 4- 4- 4  Hip internal rotation         Hip external rotation        Knee flexion 4- 4 4 3  4- 4-  Knee extension 4 4+ 5 4 4  4-  Ankle dorsiflexion 3 3 3+ 2+ 2+ 2+  Ankle plantarflexion 3 3+ 4- 3 3+ 3+  Ankle inversion        Ankle eversion        (Blank rows = not tested)  01/02/23 Right 8/22: RECERT  10/17 PN Left 8/22: Recert LUE 10/17: PN   Shoulder Flexion 4 4+ 5 4- 4+ 4+  Shoulder Abduction 4- 4 4+ 4- 4 4+  Shoulder Extension 4- 4+ 4 4- 4   Shoulder adduction 4- 4  3+ 4 4+  Bicep  4 4+ 5 4 4+ 4  Triceps 4- 4- 5 3+ 4 4      BED MOBILITY:  Perform next session  TRANSFERS: Assistive device utilized: Single point cane  Sit to stand: CGA Stand to sit: CGA Chair to chair: CGA  GAIT: Gait pattern: L knee limited extension, L foot drop, L foot eversion Distance walked: 1200 Assistive device utilized: Single point cane Level of assistance: CGA Comments: Patient has increased eversion and rolling of L foot, LLE leg length discrepancy.   FUNCTIONAL TESTS:  5 times sit to stand: 5.6 seconds with minor use of LLE no UE support Timed up and go (TUG): n/a 6 minute walk test: 1200 ft 10 meter walk test: 7 seconds BERG: 36/56   PATIENT SURVEYS:  FOTO 54  TODAY'S TREATMENT:                                                                                                                              DATE: 07/04/23 Unless otherwise stated, CGA was provided and gait belt donned in order to ensure pt safety   Physical therapy treatment session today consisted of completing assessment of goals and administration of testing as demonstrated and documented in flow sheet, treatment, and goals section of this note. Addition treatments may be found below.         PATIENT EDUCATION: Education details: goals, POC, educated on safety during updated exercises  Person educated: Patient and Spouse Education method: Explanation, Demonstration, Tactile cues, and Verbal cues Education comprehension: verbalized understanding,  returned demonstration, verbal cues required, and tactile cues required  HOME EXERCISE PROGRAM: Access Code: [email or web address] URL: [email or web address] Date: 10/11/2022 Prepared by: Precious Bard  Exercises - Seated Ankle Alphabet  - 1 x daily - 7 x weekly - 2 sets - 10 reps - 5 hold - Seated Heel Toe Raises  - 1 x daily - 7 x weekly - 2 sets - 10 reps - 5 hold - Standing March with Counter Support  - 1 x daily - 7 x weekly - 2 sets - 10 reps - 5 hold  Access Code: [email or web address] URL: [email or web address] Date: 10/30/2022 Prepared by: Precious Bard  Exercises - Seated Ankle Plantarflexion with Resistance  - 1 x daily - 7 x weekly - 2 sets - 10 reps - 5 hold - Seated Ankle Eversion with Resistance  - 1 x daily - 7 x weekly - 2 sets - 10 reps - 5 hold  Access Code: [email or web address] URL: [email or web address] Date: 11/14/2022 Prepared by: Precious Bard  Exercises - Seated Heel Toe Raises  - 1 x daily - 7 x weekly - 2 sets - 10 reps - 5 hold - Seated Hip Internal Rotation AROM  - 1 x daily - 7 x weekly - 2 sets - 10 reps - 5 hold - Bilateral Long Arc Quad  - 1 x daily - 7 x weekly - 2 sets - 10 reps - 5 hold - Foam Balance Tandem stance  - 1 x daily - 7 x weekly - 2 sets - 2 reps - 30 hold - Single Leg Balance on Foam  - 1 x daily - 7 x weekly - 2 sets - 30 reps - 2 hold - Seated  Hip Flexion March with Ankle Weights  - 1 x daily - 7 x weekly - 2 sets - 10 reps - 5 hold - Standing Hip Abduction with Ankle Weight  - 1 x daily - 7 x weekly - 2 sets - 10 reps - 5 hold - Standing Hip Extension with Ankle Weight  - 1 x daily - 7 x weekly - 2 sets - 10 reps - 5 hold - Standing Hip Flexion with Ankle Weight  - 1 x daily - 7 x weekly - 2 sets - 10 reps - 5 hold - Seated Long Arc Quad with Ankle Weight  - 1 x daily - 7 x weekly - 2 sets - 10 reps - 5 hold   GOALS: Goals reviewed with patient? Yes  SHORT TERM GOALS: Target date: 05/09/2023   Patient will be  independent in home exercise program to improve strength/mobility for better functional independence with ADLs. Baseline: 2/21: HEP give next session 5/15 : intermittent compliance 8/22: intermittent compliance due to illness 11/14: compliant  Goal status: MET    LONG TERM GOALS: Target date: 09/26/2023  Patient will increase FOTO score to equal to or greater than  64%   to demonstrate statistically significant improvement in mobility and quality of life.  Baseline: 2/21: 54% 8/22: 52% 10/17: 57% 11/14: 53% Goal status: On going   2.  Patient will increase Berg Balance score by > 6 points (42/56)to demonstrate decreased fall risk during functional activities. Baseline: 2/21: 36/56 5/15: 45   Goal status: MET  3.  Patient will increase six minute walk test distance to >1500 for progression to age norm community ambulator and improve gait ability Baseline: 2/21: 1200 ft with [facility] 5/15: 1300 ft with [facility]  8/22: 905 ft with [facility] 10/17: 1100 ft with [facility] 11/14: 1110 ft with [facility]  Goal status: In progress  4.  Patient will increase BLE gross strength to 4+/5 as to improve functional strength for independent gait, increased standing tolerance and increased ADL ability. Baseline: 2/21 :see above 5/15: see above 10/17: RLE 5/5 LLE 4/5 L ankle 3/5  11/14: RLE 5/5 LLE 4+/5 hamstring 4/5, ankle: 3+/5 Goal status:  In progress   6.  Patient will increase Berg Balance score by > 6 points (51/56) to demonstrate decreased fall risk during functional activities. Baseline:  5/15: 45 8/22: 41/56 10/17: 28 11/14: 36  Goal status: Ongoing  7.   Patient will increase UE strength to >4+/5 for ADLs, AD usage, and quality of life.  Baseline: 5/15: see above 8/22: see above 11/14: >4+/5 bilaterally  Goal status: MET  8.   Patient will increase lower extremity functional scale to >60/80 to demonstrate improved functional mobility and increased tolerance with ADLs.  Baseline: 11/14: 29%  Goal status:  Initial   ASSESSMENT:  CLINICAL IMPRESSION:  Patient has met his UE strength goal. He additionally is making improvements towards his balance , however is not yet to his Pre-illness stability levels. New goal for LEFS added to POC due to patient difficulty levels with daily tasks and mobility. His LE strength has improved with ankle and L hamstring being primarily areas not yet resolved. Patient will benefit from skilled physical therapy to improve strength, mobility, and stability for improved quality of life.   OBJECTIVE IMPAIRMENTS: Abnormal gait, decreased activity tolerance, decreased balance, decreased coordination, decreased endurance, decreased mobility, difficulty walking, decreased ROM, decreased strength, hypomobility, impaired flexibility, improper body mechanics, and postural dysfunction.   ACTIVITY LIMITATIONS: carrying, lifting,  bending, standing, squatting, stairs, transfers, reach over head, locomotion level, and caring for others  PARTICIPATION LIMITATIONS: meal prep, cleaning, laundry, personal finances, driving, shopping, community activity, and yard work  PERSONAL FACTORS: Age, Past/current experiences, Time since onset of injury/illness/exacerbation, and 3+ comorbidities: DHD, arthritis, concussion COVID, HTN, multiple lacunar infarcts, MVP, pernicious anemia, rocky mountain spotted fever, spinal stenosis, vestibular migraine, vitamin B deficiency.   are also affecting patient's functional outcome.   REHAB POTENTIAL: Good  CLINICAL DECISION MAKING: Evolving/moderate complexity  EVALUATION COMPLEXITY: Moderate  PLAN:   PT FREQUENCY: 1x/week  PT DURATION: 12 weeks  PLANNED INTERVENTIONS: Therapeutic exercises, Therapeutic activity, Neuromuscular re-education, Balance training, Gait training, Patient/Family education, Self Care, Joint mobilization, Stair training, Vestibular training, Canalith repositioning, Visual/preceptual remediation/compensation, Orthotic/Fit  training, DME instructions, Cognitive remediation, Electrical stimulation, Spinal mobilization, Cryotherapy, Moist heat, Splintting, Taping, Traction, Ultrasound, Manual therapy, and Re-evaluation  PLAN FOR NEXT SESSION:  balance, strength, unstable surfaces    Precious Bard, PT, DPT Physical Therapist - [place] [facility]  Outpatient Physical Therapy- Main Campus [phone]

## 2023-07-05 ENCOUNTER — Ambulatory Visit (INDEPENDENT_AMBULATORY_CARE_PROVIDER_SITE_OTHER): Payer: 59

## 2023-07-05 DIAGNOSIS — Z23 Encounter for immunization: Secondary | ICD-10-CM

## 2023-07-09 DIAGNOSIS — R4681 Obsessive-compulsive behavior: Secondary | ICD-10-CM | POA: Diagnosis not present

## 2023-07-09 DIAGNOSIS — F840 Autistic disorder: Secondary | ICD-10-CM | POA: Diagnosis not present

## 2023-07-09 DIAGNOSIS — F0670 Mild neurocognitive disorder due to known physiological condition without behavioral disturbance: Secondary | ICD-10-CM | POA: Diagnosis not present

## 2023-07-11 ENCOUNTER — Encounter: Payer: Self-pay | Admitting: Nurse Practitioner

## 2023-07-11 ENCOUNTER — Ambulatory Visit (INDEPENDENT_AMBULATORY_CARE_PROVIDER_SITE_OTHER): Payer: 59 | Admitting: Nurse Practitioner

## 2023-07-11 ENCOUNTER — Ambulatory Visit: Payer: 59

## 2023-07-11 ENCOUNTER — Other Ambulatory Visit: Payer: Self-pay | Admitting: Nurse Practitioner

## 2023-07-11 VITALS — BP 99/65 | HR 74 | Temp 98.0°F | Ht 67.0 in | Wt 141.2 lb

## 2023-07-11 DIAGNOSIS — R7303 Prediabetes: Secondary | ICD-10-CM | POA: Diagnosis not present

## 2023-07-11 DIAGNOSIS — R2681 Unsteadiness on feet: Secondary | ICD-10-CM | POA: Diagnosis not present

## 2023-07-11 DIAGNOSIS — Z136 Encounter for screening for cardiovascular disorders: Secondary | ICD-10-CM

## 2023-07-11 DIAGNOSIS — M6281 Muscle weakness (generalized): Secondary | ICD-10-CM | POA: Diagnosis not present

## 2023-07-11 DIAGNOSIS — Z0000 Encounter for general adult medical examination without abnormal findings: Secondary | ICD-10-CM | POA: Diagnosis not present

## 2023-07-11 DIAGNOSIS — R7989 Other specified abnormal findings of blood chemistry: Secondary | ICD-10-CM | POA: Diagnosis not present

## 2023-07-11 DIAGNOSIS — E538 Deficiency of other specified B group vitamins: Secondary | ICD-10-CM

## 2023-07-11 DIAGNOSIS — R262 Difficulty in walking, not elsewhere classified: Secondary | ICD-10-CM

## 2023-07-11 DIAGNOSIS — E559 Vitamin D deficiency, unspecified: Secondary | ICD-10-CM | POA: Diagnosis not present

## 2023-07-11 LAB — URINALYSIS, ROUTINE W REFLEX MICROSCOPIC
Bilirubin, UA: NEGATIVE
Glucose, UA: NEGATIVE
Ketones, UA: NEGATIVE
Leukocytes,UA: NEGATIVE
Nitrite, UA: NEGATIVE
Protein,UA: NEGATIVE
RBC, UA: NEGATIVE
Specific Gravity, UA: 1.02 (ref 1.005–1.030)
Urobilinogen, Ur: 0.2 mg/dL (ref 0.2–1.0)
pH, UA: 7 (ref 5.0–7.5)

## 2023-07-11 MED ORDER — CYANOCOBALAMIN 1000 MCG/ML IJ SOLN
1000.0000 ug | Freq: Once | INTRAMUSCULAR | Status: AC
  Administered 2023-07-11: 1000 ug via INTRAMUSCULAR

## 2023-07-11 NOTE — Therapy (Signed)
OUTPATIENT PHYSICAL THERAPY NEURO TREATMENT   Patient Name: [name] MRN: [id number] DOB:1961-01-17, 62 y.o., male Today's Date: 07/11/2023   PCP: Larae Grooms NP REFERRING PROVIDER: Susanne Borders PA  END OF SESSION:  PT End of Session - 07/11/23 1357     Visit Number 24    Number of Visits 35    Date for PT Re-Evaluation 09/26/23    Authorization Type eval 2/21    PT Start Time 1400    PT Stop Time 1444    PT Time Calculation (min) 44 min    Equipment Utilized During Treatment Gait belt    Activity Tolerance Patient tolerated treatment well    Behavior During Therapy [facility] for tasks assessed/performed                                 Past Medical History:  Diagnosis Date   ADHD    Arthritis    Concussion    COVID 12/2020   Hypertension    Multiple lacunar infarcts (HCC)    MVP (mitral valve prolapse)    Pernicious anemia    [facility] spotted fever    Spinal stenosis    Vestibular migraine    Vitamin B12 deficiency    Vitamin D deficiency    Past Surgical History:  Procedure Laterality Date   ANTERIOR CERVICAL DECOMP/DISCECTOMY FUSION N/A 07/30/2022   Procedure: C4-7 ANTERIOR CERVICAL DISCECTOMY AND FUSION (GLOBUS HEDRON);  Surgeon: Venetia Night, MD;  Location: [facility] ORS;  Service: Neurosurgery;  Laterality: N/A;   BILATERAL CARPAL TUNNEL RELEASE     CERVICAL WOUND DEBRIDEMENT N/A 08/01/2022   Procedure: CERVICAL WOUND DEBRIDEMENT OF HEMATOMA;  Surgeon: Venetia Night, MD;  Location: [facility] ORS;  Service: Neurosurgery;  Laterality: N/A;   FRACTURE SURGERY Left 1984   intramedullary rod   hip bone transplant  1984   LEG SURGERY Left    oseotomy   REPAIR ANKLE LIGAMENT Right 1978   REPAIR KNEE LIGAMENT Bilateral    acl   TRIGGER FINGER RELEASE Bilateral    thumbs   Patient Active Problem List   Diagnosis Date Noted   Postoperative hematoma of musculoskeletal structure following musculoskeletal procedure  08/01/2022   Cervical myelopathy (HCC) 07/30/2022   Cervical spinal stenosis 07/30/2022   Radiculopathy, cervical 07/30/2022   S/P cervical spinal fusion 07/30/2022   Vitamin D deficiency 06/25/2022   B12 deficiency 06/25/2022   Elevated LDL cholesterol level 06/25/2022   Prediabetes 03/27/2021   History of nausea- reported 01/27/2020 02/02/2020   Accidental fall from ladder 02/02/2020   Acute right-sided thoracic back pain 02/02/2020   Neck stiffness 02/02/2020   Concussion with no loss of consciousness 02/02/2020   Multiple falls 02/02/2020   Head trauma, initial encounter 02/02/2020   Acute post-traumatic headache, not intractable 02/02/2020   Essential hypertension 08/26/2018   Pre-bariatric surgery nutrition evaluation 06/19/2018   Migraine with vertigo 01/05/2014   Arthralgia of multiple joints 04/28/2009   Carpal tunnel syndrome 08/30/2008    ONSET DATE: summer 2022  REFERRING DIAG: LE weakness and gait instability   THERAPY DIAG:  Muscle weakness (generalized)  Unsteadiness on feet  Difficulty in walking, not elsewhere classified  Rationale for Evaluation and Treatment: Rehabilitation  SUBJECTIVE:  SUBJECTIVE STATEMENT: Patient reports feeling exhausted.   Pt accompanied by: self  PERTINENT HISTORY:  Patient presents for LE weakness and gait instability. He is s/p ACDF C-7 on 07/30/22. Patient has a leg length discrepancy and limited L knee flexion since his 20's. Patient has a lot of atrophy and lower back pain, foot drop bilaterally.  First symptoms was stumbling up stairs, with LLE. Foot drop was improved after ACDF. Now has numbness and tingling at the end of the day. Does have a few lesions in his pons per wife, on blood pressure medication now. PMH includes ADHD, arthritis,  concussion COVID, HTN, multiple lacunar infarcts, MVP, pernicious anemia, rocky mountain spotted fever, spinal stenosis, vestibular migraine, vitamin B deficiency.   PAIN:  Are you having pain?  Denies pain   PRECAUTIONS: Other: cervical; no lifting more than 20 lb   WEIGHT BEARING RESTRICTIONS: No  FALLS: Has patient fallen in last 6 months? Yes. Number of falls multiple a week  LIVING ENVIRONMENT: Lives with: lives with their family Lives in: House/apartment Stairs: Yes: Internal: flight steps; is sleeping downstairs Has following equipment at home: Single point cane  PLOF: Independent, uses a walking stick  PATIENT GOALS: gait control of feet, strengthen legs, balance, atrophy in hand  OBJECTIVE:   DIAGNOSTIC FINDINGS:  09/11/22: No acute fracture. No spondylolisthesis. No compression deformities. Disc space narrowing C3-4 with marginal osteophytes consistent with degenerative disc disease. Postop changes ACDF C4-C7 with anatomic alignment. Normal prevertebral and cervicocranial soft tissues.   06/19/22: 1. L5 chronic pars defects with L5-S1 anterolisthesis and advanced disc degeneration causing marked biforaminal L5 impingement. 2. Degeneration and short pedicles causes compressive spinal stenosis from L1-2 to L3-4, most advanced at L2-3. Moderate bilateral foraminal narrowing at the same levels.   COGNITION: Overall cognitive status: Within functional limits for tasks assessed    COORDINATION: Limited LLE  MUSCLE LENGTH: Significant limitation L hamstring; 60 degree flexion deficit.   POSTURE:  weight shift to the R  LOWER EXTREMITY ROM:     L knee flexion contraction -60 contraction LOWER EXTREMITY MMT:    MMT Right Eval R 5/15 R 8/22 Left Eval L 5/15 L 8/22  Hip flexion 4 5 5 4  4+ 4  Hip extension        Hip abduction 4- 4 4 4- 4 4  Hip adduction 4- 4 4 4- 4- 4  Hip internal rotation        Hip external rotation        Knee flexion 4- 4 4 3  4- 4-   Knee extension 4 4+ 5 4 4  4-  Ankle dorsiflexion 3 3 3+ 2+ 2+ 2+  Ankle plantarflexion 3 3+ 4- 3 3+ 3+  Ankle inversion        Ankle eversion        (Blank rows = not tested)  01/02/23 Right 8/22: RECERT  10/17 PN Left 8/22: Recert LUE 10/17: PN   Shoulder Flexion 4 4+ 5 4- 4+ 4+  Shoulder Abduction 4- 4 4+ 4- 4 4+  Shoulder Extension 4- 4+ 4 4- 4   Shoulder adduction 4- 4  3+ 4 4+  Bicep  4 4+ 5 4 4+ 4  Triceps 4- 4- 5 3+ 4 4      BED MOBILITY:  Perform next session  TRANSFERS: Assistive device utilized: Single point cane  Sit to stand: CGA Stand to sit: CGA Chair to chair: CGA   GAIT: Gait pattern: L knee limited extension, L  foot drop, L foot eversion Distance walked: 1200 Assistive device utilized: Single point cane Level of assistance: CGA Comments: Patient has increased eversion and rolling of L foot, LLE leg length discrepancy.   FUNCTIONAL TESTS:  5 times sit to stand: 5.6 seconds with minor use of LLE no UE support Timed up and go (TUG): n/a 6 minute walk test: 1200 ft 10 meter walk test: 7 seconds BERG: 36/56   PATIENT SURVEYS:  FOTO 54  TODAY'S TREATMENT:                                                                                                                              DATE: 07/11/23 Unless otherwise stated, CGA was provided and gait belt donned in order to ensure pt safety  TherEx  Standing with # 3 ankle weight: CGA for stability  -Hip extension with b upper extremity support, cueing for neutral hip alignment, upright posture for optimal muscle recruitment, and sequencing, 10x each LE,  -Hip abduction with b upper extremity support, cueing for neutral foot alignment for correct muscle activation, 10x each LE -Hip flexion with b upper extremity support, cueing for body mechanics, speed of muscle recruitment for optimal strengthening and stabilization 10x each LE -Hamstring curl with b upper extremity support, cueing for knee alignment for  recruitment of hamstring musculature, 10x each LE   Seated with # 3 ankle weights  -Seated marches with upright posture, back away from back of chair for abdominal/trunk activation/stabilization, 10x each LE -Seated LAQ with 3 second holds, 10x each LE, cueing for muscle activation and sequencing for neutral alignment -Seated IR/ER with cueing for stabilizing knee placement with lateral foot movement for optimal muscle recruitment, 10x each LE  6" step forward step upwith UE support ;10x each LE 6" step: lateral step ups 10x each side with BUE support   Seated: Lateral step over hedehog 10x each LE On dynadisc: -static sit 60 seconds -cross body hand clap 10x -lateral weight shift 10x   Neuro Re-ed Standing with CGA next to support surface:  Airex pad: -SLS 30 seconds each LE x2 sets -static stand eyes closed 30 seconds -6" step tandem stance 30 seconds each LE placement     PATIENT EDUCATION: Education details: goals, POC, educated on safety during updated exercises  Person educated: Patient and Spouse Education method: Explanation, Demonstration, Tactile cues, and Verbal cues Education comprehension: verbalized understanding, returned demonstration, verbal cues required, and tactile cues required  HOME EXERCISE PROGRAM: Access Code: [email or web address] URL: [email or web address] Date: 10/11/2022 Prepared by: Precious Bard  Exercises - Seated Ankle Alphabet  - 1 x daily - 7 x weekly - 2 sets - 10 reps - 5 hold - Seated Heel Toe Raises  - 1 x daily - 7 x weekly - 2 sets - 10 reps - 5 hold - Standing March with Counter Support  - 1 x daily - 7 x weekly - 2 sets - 10 reps - 5 hold  Access Code: [email or web address] URL: [email or web address] Date: 10/30/2022 Prepared by: Precious Bard  Exercises - Seated Ankle Plantarflexion with Resistance  - 1 x daily - 7 x weekly - 2 sets - 10 reps - 5 hold - Seated Ankle Eversion with Resistance  - 1 x daily - 7 x weekly - 2  sets - 10 reps - 5 hold  Access Code: [email or web address] URL: [email or web address] Date: 11/14/2022 Prepared by: Precious Bard  Exercises - Seated Heel Toe Raises  - 1 x daily - 7 x weekly - 2 sets - 10 reps - 5 hold - Seated Hip Internal Rotation AROM  - 1 x daily - 7 x weekly - 2 sets - 10 reps - 5 hold - Bilateral Long Arc Quad  - 1 x daily - 7 x weekly - 2 sets - 10 reps - 5 hold - Foam Balance Tandem stance  - 1 x daily - 7 x weekly - 2 sets - 2 reps - 30 hold - Single Leg Balance on Foam  - 1 x daily - 7 x weekly - 2 sets - 30 reps - 2 hold - Seated Hip Flexion March with Ankle Weights  - 1 x daily - 7 x weekly - 2 sets - 10 reps - 5 hold - Standing Hip Abduction with Ankle Weight  - 1 x daily - 7 x weekly - 2 sets - 10 reps - 5 hold - Standing Hip Extension with Ankle Weight  - 1 x daily - 7 x weekly - 2 sets - 10 reps - 5 hold - Standing Hip Flexion with Ankle Weight  - 1 x daily - 7 x weekly - 2 sets - 10 reps - 5 hold - Seated Long Arc Quad with Ankle Weight  - 1 x daily - 7 x weekly - 2 sets - 10 reps - 5 hold   GOALS: Goals reviewed with patient? Yes  SHORT TERM GOALS: Target date: 05/09/2023   Patient will be independent in home exercise program to improve strength/mobility for better functional independence with ADLs. Baseline: 2/21: HEP give next session 5/15 : intermittent compliance 8/22: intermittent compliance due to illness 11/14: compliant  Goal status: MET    LONG TERM GOALS: Target date: 09/26/2023  Patient will increase FOTO score to equal to or greater than  64%   to demonstrate statistically significant improvement in mobility and quality of life.  Baseline: 2/21: 54% 8/22: 52% 10/17: 57% 11/14: 53% Goal status: On going   2.  Patient will increase Berg Balance score by > 6 points (42/56)to demonstrate decreased fall risk during functional activities. Baseline: 2/21: 36/56 5/15: 45   Goal status: MET  3.  Patient will increase six minute walk test  distance to >1500 for progression to age norm community ambulator and improve gait ability Baseline: 2/21: 1200 ft with [facility] 5/15: 1300 ft with [facility]  8/22: 905 ft with [facility] 10/17: 1100 ft with [facility] 11/14: 1110 ft with [facility]  Goal status: In progress  4.  Patient will increase BLE gross strength to 4+/5 as to improve functional strength for independent gait, increased standing tolerance and increased ADL ability. Baseline: 2/21 :see above 5/15: see above 10/17: RLE 5/5 LLE 4/5 L ankle 3/5  11/14: RLE 5/5 LLE 4+/5 hamstring 4/5, ankle: 3+/5 Goal status:  In progress   6.  Patient will increase Berg Balance score by > 6 points (51/56) to demonstrate decreased fall risk during functional activities. Baseline:  5/15: 45 8/22: 41/56  10/17: 28 11/14: 36  Goal status: Ongoing  7.   Patient will increase UE strength to >4+/5 for ADLs, AD usage, and quality of life.  Baseline: 5/15: see above 8/22: see above 11/14: >4+/5 bilaterally  Goal status: MET  8.   Patient will increase lower extremity functional scale to >60/80 to demonstrate improved functional mobility and increased tolerance with ADLs.  Baseline: 11/14: 29%  Goal status: Initial   ASSESSMENT:  CLINICAL IMPRESSION: Patient presents with excellent motivation throughout session. Patient is challenged due to leg length discrepancy for single leg strengthening tasks requiring BUE support. He tolerates use of 3lb ankle weights this session. Patient will benefit from skilled physical therapy to improve strength, mobility, and stability for improved quality of life.   OBJECTIVE IMPAIRMENTS: Abnormal gait, decreased activity tolerance, decreased balance, decreased coordination, decreased endurance, decreased mobility, difficulty walking, decreased ROM, decreased strength, hypomobility, impaired flexibility, improper body mechanics, and postural dysfunction.   ACTIVITY LIMITATIONS: carrying, lifting, bending, standing, squatting, stairs, transfers,  reach over head, locomotion level, and caring for others  PARTICIPATION LIMITATIONS: meal prep, cleaning, laundry, personal finances, driving, shopping, community activity, and yard work  PERSONAL FACTORS: Age, Past/current experiences, Time since onset of injury/illness/exacerbation, and 3+ comorbidities: DHD, arthritis, concussion COVID, HTN, multiple lacunar infarcts, MVP, pernicious anemia, rocky mountain spotted fever, spinal stenosis, vestibular migraine, vitamin B deficiency.   are also affecting patient's functional outcome.   REHAB POTENTIAL: Good  CLINICAL DECISION MAKING: Evolving/moderate complexity  EVALUATION COMPLEXITY: Moderate  PLAN:   PT FREQUENCY: 1x/week  PT DURATION: 12 weeks  PLANNED INTERVENTIONS: Therapeutic exercises, Therapeutic activity, Neuromuscular re-education, Balance training, Gait training, Patient/Family education, Self Care, Joint mobilization, Stair training, Vestibular training, Canalith repositioning, Visual/preceptual remediation/compensation, Orthotic/Fit training, DME instructions, Cognitive remediation, Electrical stimulation, Spinal mobilization, Cryotherapy, Moist heat, Splintting, Taping, Traction, Ultrasound, Manual therapy, and Re-evaluation  PLAN FOR NEXT SESSION:  balance, strength, unstable surfaces    Precious Bard, PT, DPT Physical Therapist - [facility]  Outpatient Physical Therapy- Main Campus [phone]

## 2023-07-11 NOTE — Assessment & Plan Note (Signed)
Labs ordered at visit today.  Will make recommendations based on lab results.   

## 2023-07-11 NOTE — Progress Notes (Signed)
BP 99/65 (BP Location: Left Arm, Patient Position: Sitting, Cuff Size: Normal)   Pulse 74   Temp 98 F (36.7 C) (Oral)   Ht 5\' 7"  (1.702 m)   Wt 141 lb 3.2 oz (64 kg)   SpO2 99%   BMI 22.12 kg/m    Subjective:    Patient ID: [name], male    DOB: 1960-09-14, 62 y.o.   MRN: [id number]  HPI: [name] is a 62 y.o. male presenting on 07/11/2023 for comprehensive medical examination. Current medical complaints include: weight loss   He currently lives with: Interim Problems from his last visit: no  HYPERTENSION with Chronic Kidney Disease Hypertension status: controlled Satisfied with current treatment? no Duration of hypertension: years BP monitoring frequency:  not checking BP range:  BP medication side effects:  no Medication compliance: excellent compliance Previous BP meds:none Aspirin: no Recurrent headaches: no Visual changes: no Palpitations: no Dyspnea: no Chest pain: no Lower extremity edema: no Dizzy/lightheaded: no  Patient has had unexplained weight loss.  He has seen [facility].  His renin levels were elevated and it was recommended that he see Endocrinology.  He is waiting for an appointment.  He is also going to see a Insurance underwriter.  Wife is still very worried.  He does not have any appetite.  He has gained 2lbs since last visit.      Depression Screen done today and results listed below:     07/11/2023   10:59 AM 10/30/2022    1:15 PM 09/27/2022   11:16 AM 07/26/2022    2:36 PM 06/25/2022    3:05 PM  Depression screen PHQ 2/9  Decreased Interest 0 0 0 0 0  Down, Depressed, Hopeless 0 0 0 0 0  PHQ - 2 Score 0 0 0 0 0  Altered sleeping 0 0 0 0 0  Tired, decreased energy 0 0 2 0 1  Change in appetite 0 0 0 0 0  Feeling bad or failure about yourself  0 0 0 0 0  Trouble concentrating 0 0 0 0 0  Moving slowly or fidgety/restless 0 0 0 0 0  Suicidal thoughts 0 0 0 0 0  PHQ-9 Score 0 0 2 0 1  Difficult doing work/chores   Not difficult at all Somewhat difficult Not difficult at all Not difficult at all    The patient does a history of falls. I did complete a risk assessment for falls. A plan of care for falls was documented.   Past Medical History:  Past Medical History:  Diagnosis Date   ADHD    Arthritis    Concussion    COVID 12/2020   Hypertension    Multiple lacunar infarcts (HCC)    MVP (mitral valve prolapse)    Pernicious anemia    [facility] spotted fever    Spinal stenosis    Vestibular migraine    Vitamin B12 deficiency    Vitamin D deficiency     Surgical History:  Past Surgical History:  Procedure Laterality Date   ANTERIOR CERVICAL DECOMP/DISCECTOMY FUSION N/A 07/30/2022   Procedure: C4-7 ANTERIOR CERVICAL DISCECTOMY AND FUSION (GLOBUS HEDRON);  Surgeon: Venetia Night, MD;  Location: [facility] ORS;  Service: Neurosurgery;  Laterality: N/A;   BILATERAL CARPAL TUNNEL RELEASE     CERVICAL WOUND DEBRIDEMENT N/A 08/01/2022   Procedure: CERVICAL WOUND DEBRIDEMENT OF HEMATOMA;  Surgeon: Venetia Night, MD;  Location: [facility] ORS;  Service: Neurosurgery;  Laterality: N/A;   FRACTURE  SURGERY Left 1984   intramedullary rod   hip bone transplant  1984   LEG SURGERY Left    oseotomy   REPAIR ANKLE LIGAMENT Right 1978   REPAIR KNEE LIGAMENT Bilateral    acl   TRIGGER FINGER RELEASE Bilateral    thumbs    Medications:  Current Outpatient Medications on File Prior to Visit  Medication Sig   acetaminophen (TYLENOL) 500 MG tablet Take 500 mg by mouth every 6 (six) hours as needed.   aspirin EC 81 MG tablet Take 81 mg by mouth daily. Swallow whole.   Chlorpheniramine-Acetaminophen (CORICIDIN HBP COLD/FLU PO) Take by mouth as needed.   Cholecalciferol (VITAMIN D3 PO) Take 5,000 Int'l Units by mouth daily.   Cyanocobalamin (VITAMIN B 12 PO) Take 800 mcg by mouth daily.   diphenhydrAMINE (BENADRYL) 25 mg capsule Take 50 mg by mouth at bedtime as needed.   Docusate Sodium (COLACE PO)  Take by mouth daily. Daily   lisinopril (ZESTRIL) 20 MG tablet TAKE 1 TABLET BY MOUTH EVERY DAY   rizatriptan (MAXALT) 10 MG tablet Take 1 tablet (10 mg total) by mouth as needed for migraine. May repeat in 2 hours if needed   No current facility-administered medications on file prior to visit.    Allergies:  No Known Allergies  Social History:  Social History   Socioeconomic History   Marital status: Married    Spouse name: Not on file   Number of children: Not on file   Years of education: Not on file   Highest education level: Not on file  Occupational History   Not on file  Tobacco Use   Smoking status: Never   Smokeless tobacco: Never  Vaping Use   Vaping status: Never Used  Substance and Sexual Activity   Alcohol use: Not Currently   Drug use: No   Sexual activity: Not Currently  Other Topics Concern   Not on file  Social History Narrative   Not on file   Social Determinants of Health   Financial Resource Strain: Not on file  Food Insecurity: No Food Insecurity (08/06/2022)   Hunger Vital Sign    Worried About Running Out of Food in the Last Year: Never true    Ran Out of Food in the Last Year: Never true  Transportation Needs: No Transportation Needs (08/06/2022)   PRAPARE - Administrator, Civil Service (Medical): No    Lack of Transportation (Non-Medical): No  Physical Activity: Not on file  Stress: Not on file  Social Connections: Not on file  Intimate Partner Violence: Not At Risk (08/01/2022)   Humiliation, Afraid, Rape, and Kick questionnaire    Fear of Current or Ex-Partner: No    Emotionally Abused: No    Physically Abused: No    Sexually Abused: No   Social History   Tobacco Use  Smoking Status Never  Smokeless Tobacco Never   Social History   Substance and Sexual Activity  Alcohol Use Not Currently    Family History:  Family History  Problem Relation Age of Onset   Breast cancer Mother    Thyroid cancer Mother     Lymphoma Father    CVA Father    Heart attack Sister    Hypertension Sister     Past medical history, surgical history, medications, allergies, family history and social history reviewed with patient today and changes made to appropriate areas of the chart.   Review of Systems  Eyes:  Negative for  blurred vision and double vision.  Respiratory:  Negative for shortness of breath.   Cardiovascular:  Negative for chest pain, palpitations and leg swelling.  Neurological:  Positive for tingling and weakness. Negative for dizziness and headaches.   All other ROS negative except what is listed above and in the HPI.      Objective:    BP 99/65 (BP Location: Left Arm, Patient Position: Sitting, Cuff Size: Normal)   Pulse 74   Temp 98 F (36.7 C) (Oral)   Ht 5\' 7"  (1.702 m)   Wt 141 lb 3.2 oz (64 kg)   SpO2 99%   BMI 22.12 kg/m   Wt Readings from Last 3 Encounters:  07/11/23 141 lb 3.2 oz (64 kg)  05/28/23 139 lb 9.6 oz (63.3 kg)  05/19/23 140 lb (63.5 kg)    Physical Exam Vitals and nursing note reviewed.  Constitutional:      General: He is not in acute distress.    Appearance: Normal appearance. He is normal weight. He is not ill-appearing, toxic-appearing or diaphoretic.  HENT:     Head: Normocephalic.     Right Ear: Tympanic membrane, ear canal and external ear normal.     Left Ear: Tympanic membrane, ear canal and external ear normal.     Nose: Nose normal. No congestion or rhinorrhea.     Mouth/Throat:     Mouth: Mucous membranes are moist.  Eyes:     General:        Right eye: No discharge.        Left eye: No discharge.     Extraocular Movements: Extraocular movements intact.     Conjunctiva/sclera: Conjunctivae normal.     Pupils: Pupils are equal, round, and reactive to light.  Cardiovascular:     Rate and Rhythm: Normal rate and regular rhythm.     Heart sounds: No murmur heard. Pulmonary:     Effort: Pulmonary effort is normal. No respiratory distress.      Breath sounds: Normal breath sounds. No wheezing, rhonchi or rales.  Abdominal:     General: Abdomen is flat. Bowel sounds are normal. There is no distension.     Palpations: Abdomen is soft.     Tenderness: There is no abdominal tenderness. There is no guarding.  Musculoskeletal:     Cervical back: Normal range of motion and neck supple.  Skin:    General: Skin is warm and dry.     Capillary Refill: Capillary refill takes less than 2 seconds.  Neurological:     General: No focal deficit present.     Mental Status: He is alert and oriented to person, place, and time.     Cranial Nerves: No cranial nerve deficit.     Motor: No weakness.     Deep Tendon Reflexes: Reflexes normal.  Psychiatric:        Mood and Affect: Mood normal.        Behavior: Behavior normal.        Thought Content: Thought content normal.        Judgment: Judgment normal.     Results for orders placed or performed in visit on 05/28/23  Thyroid peroxidase antibody  Result Value Ref Range   Thyroperoxidase Ab SerPl-aCnc <9 0 - 34 IU/mL  Thyroid Panel With TSH  Result Value Ref Range   TSH 0.973 0.450 - 4.500 uIU/mL   T4, Total 8.2 4.5 - 12.0 ug/dL   T3 Uptake Ratio 24 24 - 39 %  Free Thyroxine Index 2.0 1.2 - 4.9  Vitamin D (25 hydroxy)  Result Value Ref Range   Vit D, 25-Hydroxy 71.3 30.0 - 100.0 ng/mL      Assessment & Plan:   Problem List Items Addressed This Visit       Other   Prediabetes    Labs ordered at visit today.  Will make recommendations based on lab results.        Relevant Orders   HgB A1c   Vitamin D deficiency    Labs ordered at visit today.  Will make recommendations based on lab results.        Relevant Orders   Vitamin D (25 hydroxy)   B12 deficiency    Labs ordered at visit today.  Will make recommendations based on lab results.        Relevant Medications   cyanocobalamin (VITAMIN B12) injection 1,000 mcg (Start on 07/11/2023 11:30 AM)   Other Relevant Orders    B12   Other Visit Diagnoses     Annual physical exam    -  Primary   Health maintenance reviewed during visit today.  Labs ordered.  Vaccines up to date.  Colonoscopy up to date.   Relevant Orders   TSH   PSA   Lipid panel   CBC with Differential/Platelet   Comprehensive metabolic panel   Urinalysis, Routine w reflex microscopic   B12   High serum renin       Relevant Orders   Ambulatory referral to Nephrology   Screening for ischemic heart disease       Relevant Orders   Lipid panel         Discussed aspirin prophylaxis for myocardial infarction prevention and decision was it was not indicated  LABORATORY TESTING:  Health maintenance labs ordered today as discussed above.   The natural history of prostate cancer and ongoing controversy regarding screening and potential treatment outcomes of prostate cancer has been discussed with the patient. The meaning of a false positive PSA and a false negative PSA has been discussed. He indicates understanding of the limitations of this screening test and wishes to proceed with screening PSA testing.   IMMUNIZATIONS:   - Tdap: Tetanus vaccination status reviewed: last tetanus booster within 10 years. - Influenza: Administered today - Pneumovax: Not applicable - Prevnar: Not applicable - COVID: Up to date - HPV: Not applicable - Shingrix vaccine: Up to date  SCREENING: - Colonoscopy: Up to date  Discussed with patient purpose of the colonoscopy is to detect colon cancer at curable precancerous or early stages   - AAA Screening: Not applicable  -Hearing Test: Not applicable  -Spirometry: Not applicable   PATIENT COUNSELING:    Sexuality: Discussed sexually transmitted diseases, partner selection, use of condoms, avoidance of unintended pregnancy  and contraceptive alternatives.   Advised to avoid cigarette smoking.  I discussed with the patient that most people either abstain from alcohol or drink within safe limits  (<=14/week and <=4 drinks/occasion for males, <=7/weeks and <= 3 drinks/occasion for females) and that the risk for alcohol disorders and other health effects rises proportionally with the number of drinks per week and how often a drinker exceeds daily limits.  Discussed cessation/primary prevention of drug use and availability of treatment for abuse.   Diet: Encouraged to adjust caloric intake to maintain  or achieve ideal body weight, to reduce intake of dietary saturated fat and total fat, to limit sodium intake by avoiding high sodium foods and  not adding table salt, and to maintain adequate dietary potassium and calcium preferably from fresh fruits, vegetables, and low-fat dairy products.    stressed the importance of regular exercise  Injury prevention: Discussed safety belts, safety helmets, smoke detector, smoking near bedding or upholstery.   Dental health: Discussed importance of regular tooth brushing, flossing, and dental visits.   Follow up plan: NEXT PREVENTATIVE PHYSICAL DUE IN 1 YEAR. No follow-ups on file.

## 2023-07-12 ENCOUNTER — Telehealth: Payer: Self-pay | Admitting: Nurse Practitioner

## 2023-07-12 LAB — TSH: TSH: 0.933 u[IU]/mL (ref 0.450–4.500)

## 2023-07-12 LAB — CBC WITH DIFFERENTIAL/PLATELET
Basophils Absolute: 0 10*3/uL (ref 0.0–0.2)
Basos: 0 %
EOS (ABSOLUTE): 0 10*3/uL (ref 0.0–0.4)
Eos: 0 %
Hematocrit: 43.3 % (ref 37.5–51.0)
Hemoglobin: 14.4 g/dL (ref 13.0–17.7)
Immature Grans (Abs): 0 10*3/uL (ref 0.0–0.1)
Immature Granulocytes: 0 %
Lymphocytes Absolute: 1.7 10*3/uL (ref 0.7–3.1)
Lymphs: 21 %
MCH: 31.2 pg (ref 26.6–33.0)
MCHC: 33.3 g/dL (ref 31.5–35.7)
MCV: 94 fL (ref 79–97)
Monocytes Absolute: 0.5 10*3/uL (ref 0.1–0.9)
Monocytes: 6 %
Neutrophils Absolute: 5.8 10*3/uL (ref 1.4–7.0)
Neutrophils: 73 %
Platelets: 278 10*3/uL (ref 150–450)
RBC: 4.61 x10E6/uL (ref 4.14–5.80)
RDW: 12.6 % (ref 11.6–15.4)
WBC: 8.1 10*3/uL (ref 3.4–10.8)

## 2023-07-12 LAB — COMPREHENSIVE METABOLIC PANEL
ALT: 12 [IU]/L (ref 0–44)
AST: 16 [IU]/L (ref 0–40)
Albumin: 4.5 g/dL (ref 3.9–4.9)
Alkaline Phosphatase: 83 [IU]/L (ref 44–121)
BUN/Creatinine Ratio: 18 (ref 10–24)
BUN: 14 mg/dL (ref 8–27)
Bilirubin Total: 1 mg/dL (ref 0.0–1.2)
CO2: 26 mmol/L (ref 20–29)
Calcium: 9.1 mg/dL (ref 8.6–10.2)
Chloride: 100 mmol/L (ref 96–106)
Creatinine, Ser: 0.76 mg/dL (ref 0.76–1.27)
Globulin, Total: 2 g/dL (ref 1.5–4.5)
Glucose: 103 mg/dL — ABNORMAL HIGH (ref 70–99)
Potassium: 4.7 mmol/L (ref 3.5–5.2)
Sodium: 137 mmol/L (ref 134–144)
Total Protein: 6.5 g/dL (ref 6.0–8.5)
eGFR: 101 mL/min/{1.73_m2} (ref >59)

## 2023-07-12 LAB — LIPID PANEL
Chol/HDL Ratio: 2.5 ratio (ref 0.0–5.0)
Cholesterol, Total: 200 mg/dL — ABNORMAL HIGH (ref 100–199)
HDL: 81 mg/dL (ref >39)
LDL Chol Calc (NIH): 102 mg/dL — ABNORMAL HIGH (ref 0–99)
Triglycerides: 95 mg/dL (ref 0–149)
VLDL Cholesterol Cal: 17 mg/dL (ref 5–40)

## 2023-07-12 LAB — VITAMIN B12: Vitamin B-12: 924 pg/mL (ref 232–1245)

## 2023-07-12 LAB — PSA: Prostate Specific Ag, Serum: 1.1 ng/mL (ref 0.0–4.0)

## 2023-07-12 LAB — HEMOGLOBIN A1C
Est. average glucose Bld gHb Est-mCnc: 117 mg/dL
Hgb A1c MFr Bld: 5.7 % — ABNORMAL HIGH (ref 4.8–5.6)

## 2023-07-12 LAB — VITAMIN D 25 HYDROXY (VIT D DEFICIENCY, FRACTURES): Vit D, 25-Hydroxy: 69.7 ng/mL (ref 30.0–100.0)

## 2023-07-12 NOTE — Telephone Encounter (Signed)
Attempted to return call to [name] but the call went to voicemail. LMOM advising [name] to return my call.

## 2023-07-12 NOTE — Telephone Encounter (Signed)
Copied from CRM [id number]. Topic: General - Other >> Jul 12, 2023  9:21 AM Marlow Baars wrote: Reason for CRM: Darl Pikes the spouse of the patient called in on behalf of her husband and would like to speak with Laurelyn Sickle if possible. She states it is for 2 separate important things. She has an appt at [facility] but will be out after 11. Please assist patient further

## 2023-07-12 NOTE — Telephone Encounter (Signed)
Spoke with Mrs. [place] regarding concerns about [facility] receiving the referral.  Informed Mrs. Lessa that I would contact [facility] to ensure the referral was received. The caller also expressed concern about the rug located on the carpet in the lobby and requested that it be moved due to Mr. Kadir experiencing difficulty maneuvering around it when attempting to sit in the lobby. I let Mrs. Frasier know that the rug will be removed as it is our goal to ensure the safety of all of our patients.  Ms. Tassinari acknowledged understanding and thanked me for taking her call.

## 2023-07-16 ENCOUNTER — Ambulatory Visit: Payer: 59

## 2023-07-16 DIAGNOSIS — F0670 Mild neurocognitive disorder due to known physiological condition without behavioral disturbance: Secondary | ICD-10-CM | POA: Diagnosis not present

## 2023-07-16 DIAGNOSIS — R4681 Obsessive-compulsive behavior: Secondary | ICD-10-CM | POA: Diagnosis not present

## 2023-07-16 DIAGNOSIS — F840 Autistic disorder: Secondary | ICD-10-CM | POA: Diagnosis not present

## 2023-07-17 DIAGNOSIS — R7989 Other specified abnormal findings of blood chemistry: Secondary | ICD-10-CM | POA: Diagnosis not present

## 2023-07-19 ENCOUNTER — Encounter: Payer: Self-pay | Admitting: Psychology
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
## 2023-07-21 ENCOUNTER — Other Ambulatory Visit: Payer: Self-pay | Admitting: Nurse Practitioner

## 2023-07-23 ENCOUNTER — Ambulatory Visit: Payer: 59 | Admitting: Urology

## 2023-07-24 DIAGNOSIS — R7989 Other specified abnormal findings of blood chemistry: Secondary | ICD-10-CM | POA: Diagnosis not present

## 2023-07-24 DIAGNOSIS — R634 Abnormal weight loss: Secondary | ICD-10-CM | POA: Diagnosis not present

## 2023-07-24 NOTE — Telephone Encounter (Signed)
Requested Prescriptions  Pending Prescriptions Disp Refills   lisinopril (ZESTRIL) 20 MG tablet [Pharmacy Med Name: LISINOPRIL 20 MG TABLET] 30 tablet 2    Sig: TAKE 1 TABLET BY MOUTH EVERY DAY     Cardiovascular:  ACE Inhibitors Passed - 07/21/2023  8:57 AM      Passed - Cr in normal range and within 180 days    Creatinine  Date Value Ref Range Status  11/13/2011 0.70 0.60 - 1.30 mg/dL Final   Creatinine, Ser  Date Value Ref Range Status  07/11/2023 0.76 0.76 - 1.27 mg/dL Final         Passed - K in normal range and within 180 days    Potassium  Date Value Ref Range Status  07/11/2023 4.7 3.5 - 5.2 mmol/L Final  11/13/2011 4.1 3.5 - 5.1 mmol/L Final         Passed - Patient is not pregnant      Passed - Last BP in normal range    BP Readings from Last 1 Encounters:  07/11/23 99/65         Passed - Valid encounter within last 6 months    Recent Outpatient Visits           1 week ago Annual physical exam   [facility] Larae Grooms, NP   1 month ago Unintentional weight loss   [facility] Larae Grooms, NP   6 months ago Essential hypertension   [place] [facility] Larae Grooms, NP   7 months ago Viral upper respiratory tract infection   [facility] Larae Grooms, NP   8 months ago Essential hypertension   [place] [facility] Larae Grooms, NP       Future Appointments             In 11 months Larae Grooms, NP [facility]

## 2023-07-25 ENCOUNTER — Encounter: Payer: Self-pay | Admitting: Neurosurgery

## 2023-07-25 ENCOUNTER — Ambulatory Visit: Payer: 59

## 2023-07-25 DIAGNOSIS — R4681 Obsessive-compulsive behavior: Secondary | ICD-10-CM | POA: Diagnosis not present

## 2023-07-25 DIAGNOSIS — F0670 Mild neurocognitive disorder due to known physiological condition without behavioral disturbance: Secondary | ICD-10-CM | POA: Diagnosis not present

## 2023-07-25 DIAGNOSIS — F840 Autistic disorder: Secondary | ICD-10-CM | POA: Diagnosis not present

## 2023-07-29 ENCOUNTER — Encounter: Payer: Self-pay | Admitting: Nurse Practitioner

## 2023-07-29 DIAGNOSIS — R634 Abnormal weight loss: Secondary | ICD-10-CM

## 2023-07-31 DIAGNOSIS — R7989 Other specified abnormal findings of blood chemistry: Secondary | ICD-10-CM | POA: Diagnosis not present

## 2023-07-31 DIAGNOSIS — R634 Abnormal weight loss: Secondary | ICD-10-CM | POA: Diagnosis not present

## 2023-08-01 ENCOUNTER — Ambulatory Visit: Payer: 59 | Attending: Neurosurgery

## 2023-08-01 DIAGNOSIS — F0670 Mild neurocognitive disorder due to known physiological condition without behavioral disturbance: Secondary | ICD-10-CM | POA: Diagnosis not present

## 2023-08-01 DIAGNOSIS — R4681 Obsessive-compulsive behavior: Secondary | ICD-10-CM | POA: Diagnosis not present

## 2023-08-01 DIAGNOSIS — R2681 Unsteadiness on feet: Secondary | ICD-10-CM | POA: Diagnosis not present

## 2023-08-01 DIAGNOSIS — F840 Autistic disorder: Secondary | ICD-10-CM | POA: Diagnosis not present

## 2023-08-01 DIAGNOSIS — M6281 Muscle weakness (generalized): Secondary | ICD-10-CM | POA: Insufficient documentation

## 2023-08-01 DIAGNOSIS — R262 Difficulty in walking, not elsewhere classified: Secondary | ICD-10-CM | POA: Diagnosis not present

## 2023-08-01 NOTE — Therapy (Signed)
OUTPATIENT PHYSICAL THERAPY NEURO TREATMENT   Patient Name: [name] MRN: [id number] DOB:09/28/1960, 62 y.o., male Today's Date: 08/01/2023   PCP: Larae Grooms NP REFERRING PROVIDER: Susanne Borders PA  END OF SESSION:  PT End of Session - 08/01/23 1420     Visit Number 25    Number of Visits 35    Date for PT Re-Evaluation 09/26/23    Authorization Type eval 2/21    PT Start Time 1400    PT Stop Time 1444    PT Time Calculation (min) 44 min    Equipment Utilized During Treatment Gait belt    Activity Tolerance Patient tolerated treatment well    Behavior During Therapy WFL for tasks assessed/performed                                  Past Medical History:  Diagnosis Date   ADHD    Arthritis    Concussion    COVID 12/2020   Hypertension    Multiple lacunar infarcts (HCC)    MVP (mitral valve prolapse)    Pernicious anemia    [facility] spotted fever    Spinal stenosis    Vestibular migraine    Vitamin B12 deficiency    Vitamin D deficiency    Past Surgical History:  Procedure Laterality Date   ANTERIOR CERVICAL DECOMP/DISCECTOMY FUSION N/A 07/30/2022   Procedure: C4-7 ANTERIOR CERVICAL DISCECTOMY AND FUSION (GLOBUS HEDRON);  Surgeon: Venetia Night, MD;  Location: [facility] ORS;  Service: Neurosurgery;  Laterality: N/A;   BILATERAL CARPAL TUNNEL RELEASE     CERVICAL WOUND DEBRIDEMENT N/A 08/01/2022   Procedure: CERVICAL WOUND DEBRIDEMENT OF HEMATOMA;  Surgeon: Venetia Night, MD;  Location: [facility] ORS;  Service: Neurosurgery;  Laterality: N/A;   FRACTURE SURGERY Left 1984   intramedullary rod   hip bone transplant  1984   LEG SURGERY Left    oseotomy   REPAIR ANKLE LIGAMENT Right 1978   REPAIR KNEE LIGAMENT Bilateral    acl   TRIGGER FINGER RELEASE Bilateral    thumbs   Patient Active Problem List   Diagnosis Date Noted   Postoperative hematoma of musculoskeletal structure following musculoskeletal  procedure 08/01/2022   Cervical myelopathy (HCC) 07/30/2022   Cervical spinal stenosis 07/30/2022   Radiculopathy, cervical 07/30/2022   S/P cervical spinal fusion 07/30/2022   Vitamin D deficiency 06/25/2022   B12 deficiency 06/25/2022   Elevated LDL cholesterol level 06/25/2022   Prediabetes 03/27/2021   History of nausea- reported 01/27/2020 02/02/2020   Accidental fall from ladder 02/02/2020   Acute right-sided thoracic back pain 02/02/2020   Neck stiffness 02/02/2020   Concussion with no loss of consciousness 02/02/2020   Multiple falls 02/02/2020   Head trauma, initial encounter 02/02/2020   Acute post-traumatic headache, not intractable 02/02/2020   Essential hypertension 08/26/2018   Pre-bariatric surgery nutrition evaluation 06/19/2018   Migraine with vertigo 01/05/2014   Arthralgia of multiple joints 04/28/2009   Carpal tunnel syndrome 08/30/2008    ONSET DATE: summer 2022  REFERRING DIAG: LE weakness and gait instability   THERAPY DIAG:  Muscle weakness (generalized)  Unsteadiness on feet  Difficulty in walking, not elsewhere classified  Rationale for Evaluation and Treatment: Rehabilitation  SUBJECTIVE:  SUBJECTIVE STATEMENT: Patient reports his damage occurred between 2015-2022, guessing 2020 due to change in personality.   Pt accompanied by: self  PERTINENT HISTORY:  Patient presents for LE weakness and gait instability. He is s/p ACDF C-7 on 07/30/22. Patient has a leg length discrepancy and limited L knee flexion since his 20's. Patient has a lot of atrophy and lower back pain, foot drop bilaterally.  First symptoms was stumbling up stairs, with LLE. Foot drop was improved after ACDF. Now has numbness and tingling at the end of the day. Does have a few lesions in his pons  per wife, on blood pressure medication now. PMH includes ADHD, arthritis, concussion COVID, HTN, multiple lacunar infarcts, MVP, pernicious anemia, rocky mountain spotted fever, spinal stenosis, vestibular migraine, vitamin B deficiency.   PAIN:  Are you having pain?  Denies pain   PRECAUTIONS: Other: cervical; no lifting more than 20 lb   WEIGHT BEARING RESTRICTIONS: No  FALLS: Has patient fallen in last 6 months? Yes. Number of falls multiple a week  LIVING ENVIRONMENT: Lives with: lives with their family Lives in: House/apartment Stairs: Yes: Internal: flight steps; is sleeping downstairs Has following equipment at home: Single point cane  PLOF: Independent, uses a walking stick  PATIENT GOALS: gait control of feet, strengthen legs, balance, atrophy in hand  OBJECTIVE:   DIAGNOSTIC FINDINGS:  09/11/22: No acute fracture. No spondylolisthesis. No compression deformities. Disc space narrowing C3-4 with marginal osteophytes consistent with degenerative disc disease. Postop changes ACDF C4-C7 with anatomic alignment. Normal prevertebral and cervicocranial soft tissues.   06/19/22: 1. L5 chronic pars defects with L5-S1 anterolisthesis and advanced disc degeneration causing marked biforaminal L5 impingement. 2. Degeneration and short pedicles causes compressive spinal stenosis from L1-2 to L3-4, most advanced at L2-3. Moderate bilateral foraminal narrowing at the same levels.   COGNITION: Overall cognitive status: Within functional limits for tasks assessed    COORDINATION: Limited LLE  MUSCLE LENGTH: Significant limitation L hamstring; 60 degree flexion deficit.   POSTURE:  weight shift to the R  LOWER EXTREMITY ROM:     L knee flexion contraction -60 contraction LOWER EXTREMITY MMT:    MMT Right Eval R 5/15 R 8/22 Left Eval L 5/15 L 8/22  Hip flexion 4 5 5 4  4+ 4  Hip extension        Hip abduction 4- 4 4 4- 4 4  Hip adduction 4- 4 4 4- 4- 4  Hip internal  rotation        Hip external rotation        Knee flexion 4- 4 4 3  4- 4-  Knee extension 4 4+ 5 4 4  4-  Ankle dorsiflexion 3 3 3+ 2+ 2+ 2+  Ankle plantarflexion 3 3+ 4- 3 3+ 3+  Ankle inversion        Ankle eversion        (Blank rows = not tested)  01/02/23 Right 8/22: RECERT  10/17 PN Left 8/22: Recert LUE 10/17: PN   Shoulder Flexion 4 4+ 5 4- 4+ 4+  Shoulder Abduction 4- 4 4+ 4- 4 4+  Shoulder Extension 4- 4+ 4 4- 4   Shoulder adduction 4- 4  3+ 4 4+  Bicep  4 4+ 5 4 4+ 4  Triceps 4- 4- 5 3+ 4 4      BED MOBILITY:  Perform next session  TRANSFERS: Assistive device utilized: Single point cane  Sit to stand: CGA Stand to sit: CGA Chair to chair: CGA  GAIT: Gait pattern: L knee limited extension, L foot drop, L foot eversion Distance walked: 1200 Assistive device utilized: Single point cane Level of assistance: CGA Comments: Patient has increased eversion and rolling of L foot, LLE leg length discrepancy.   FUNCTIONAL TESTS:  5 times sit to stand: 5.6 seconds with minor use of LLE no UE support Timed up and go (TUG): n/a 6 minute walk test: 1200 ft 10 meter walk test: 7 seconds BERG: 36/56   PATIENT SURVEYS:  FOTO 54  TODAY'S TREATMENT:                                                                                                                              DATE: 08/01/23 Unless otherwise stated, CGA was provided and gait belt donned in order to ensure pt safety  TherEx  5lb bar:  Chest press 15x Bicep curl 15x Bicep curl to overhead press 15x Reverse curl to overhead press 15x Witches brew 10x, counterclockwise 10x   Hamstring curl sliders 20x each LE Swiss ball TrA 20x Swiss ball TrA with alternating UE raises 10x each side  LE raises raising swiss ball off ground 20x Df /pf on half faom roller 20x one leg at a time.      Neuro Re-ed Standing with CGA next to support surface:  Airex pad: -static stand 30 seconds x 3 trials   Opposite  arm/leg raises 20x   PATIENT EDUCATION: Education details: goals, POC, educated on safety during updated exercises  Person educated: Patient and Spouse Education method: Explanation, Demonstration, Tactile cues, and Verbal cues Education comprehension: verbalized understanding, returned demonstration, verbal cues required, and tactile cues required  HOME EXERCISE PROGRAM: Access Code: [email or web address] URL: [email or web address] Date: 10/11/2022 Prepared by: Precious Bard  Exercises - Seated Ankle Alphabet  - 1 x daily - 7 x weekly - 2 sets - 10 reps - 5 hold - Seated Heel Toe Raises  - 1 x daily - 7 x weekly - 2 sets - 10 reps - 5 hold - Standing March with Counter Support  - 1 x daily - 7 x weekly - 2 sets - 10 reps - 5 hold  Access Code: [email or web address] URL: [email or web address] Date: 10/30/2022 Prepared by: Precious Bard  Exercises - Seated Ankle Plantarflexion with Resistance  - 1 x daily - 7 x weekly - 2 sets - 10 reps - 5 hold - Seated Ankle Eversion with Resistance  - 1 x daily - 7 x weekly - 2 sets - 10 reps - 5 hold  Access Code: [email or web address] URL: [email or web address] Date: 11/14/2022 Prepared by: Precious Bard  Exercises - Seated Heel Toe Raises  - 1 x daily - 7 x weekly - 2 sets - 10 reps - 5 hold - Seated Hip Internal Rotation AROM  - 1 x daily - 7 x weekly - 2 sets - 10 reps - 5 hold - Bilateral Long Arc Quad  - 1 x daily - 7 x  weekly - 2 sets - 10 reps - 5 hold - Foam Balance Tandem stance  - 1 x daily - 7 x weekly - 2 sets - 2 reps - 30 hold - Single Leg Balance on Foam  - 1 x daily - 7 x weekly - 2 sets - 30 reps - 2 hold - Seated Hip Flexion March with Ankle Weights  - 1 x daily - 7 x weekly - 2 sets - 10 reps - 5 hold - Standing Hip Abduction with Ankle Weight  - 1 x daily - 7 x weekly - 2 sets - 10 reps - 5 hold - Standing Hip Extension with Ankle Weight  - 1 x daily - 7 x weekly - 2 sets - 10 reps - 5 hold - Standing Hip Flexion  with Ankle Weight  - 1 x daily - 7 x weekly - 2 sets - 10 reps - 5 hold - Seated Long Arc Quad with Ankle Weight  - 1 x daily - 7 x weekly - 2 sets - 10 reps - 5 hold   GOALS: Goals reviewed with patient? Yes  SHORT TERM GOALS: Target date: 05/09/2023   Patient will be independent in home exercise program to improve strength/mobility for better functional independence with ADLs. Baseline: 2/21: HEP give next session 5/15 : intermittent compliance 8/22: intermittent compliance due to illness 11/14: compliant  Goal status: MET    LONG TERM GOALS: Target date: 09/26/2023  Patient will increase FOTO score to equal to or greater than  64%   to demonstrate statistically significant improvement in mobility and quality of life.  Baseline: 2/21: 54% 8/22: 52% 10/17: 57% 11/14: 53% Goal status: On going   2.  Patient will increase Berg Balance score by > 6 points (42/56)to demonstrate decreased fall risk during functional activities. Baseline: 2/21: 36/56 5/15: 45   Goal status: MET  3.  Patient will increase six minute walk test distance to >1500 for progression to age norm community ambulator and improve gait ability Baseline: 2/21: 1200 ft with [facility] 5/15: 1300 ft with [facility]  8/22: 905 ft with [facility] 10/17: 1100 ft with [facility] 11/14: 1110 ft with [facility]  Goal status: In progress  4.  Patient will increase BLE gross strength to 4+/5 as to improve functional strength for independent gait, increased standing tolerance and increased ADL ability. Baseline: 2/21 :see above 5/15: see above 10/17: RLE 5/5 LLE 4/5 L ankle 3/5  11/14: RLE 5/5 LLE 4+/5 hamstring 4/5, ankle: 3+/5 Goal status:  In progress   6.  Patient will increase Berg Balance score by > 6 points (51/56) to demonstrate decreased fall risk during functional activities. Baseline:  5/15: 45 8/22: 41/56 10/17: 28 11/14: 36  Goal status: Ongoing  7.   Patient will increase UE strength to >4+/5 for ADLs, AD usage, and quality of life.  Baseline:  5/15: see above 8/22: see above 11/14: >4+/5 bilaterally  Goal status: MET  8.   Patient will increase lower extremity functional scale to >60/80 to demonstrate improved functional mobility and increased tolerance with ADLs.  Baseline: 11/14: 29%  Goal status: Initial   ASSESSMENT:  CLINICAL IMPRESSION: Patient presents with good motivation. He initially is tired from his walk into the PT clinic. He tolerates progressive strengthening and stabilization training. He is highly motivated throughout session and eager to progress his mobility. He is challenged with leaning forward to pick up objects off the ground.  Patient will benefit from skilled physical therapy  to improve strength, mobility, and stability for improved quality of life.   OBJECTIVE IMPAIRMENTS: Abnormal gait, decreased activity tolerance, decreased balance, decreased coordination, decreased endurance, decreased mobility, difficulty walking, decreased ROM, decreased strength, hypomobility, impaired flexibility, improper body mechanics, and postural dysfunction.   ACTIVITY LIMITATIONS: carrying, lifting, bending, standing, squatting, stairs, transfers, reach over head, locomotion level, and caring for others  PARTICIPATION LIMITATIONS: meal prep, cleaning, laundry, personal finances, driving, shopping, community activity, and yard work  PERSONAL FACTORS: Age, Past/current experiences, Time since onset of injury/illness/exacerbation, and 3+ comorbidities: DHD, arthritis, concussion COVID, HTN, multiple lacunar infarcts, MVP, pernicious anemia, rocky mountain spotted fever, spinal stenosis, vestibular migraine, vitamin B deficiency.   are also affecting patient's functional outcome.   REHAB POTENTIAL: Good  CLINICAL DECISION MAKING: Evolving/moderate complexity  EVALUATION COMPLEXITY: Moderate  PLAN:   PT FREQUENCY: 1x/week  PT DURATION: 12 weeks  PLANNED INTERVENTIONS: Therapeutic exercises, Therapeutic activity,  Neuromuscular re-education, Balance training, Gait training, Patient/Family education, Self Care, Joint mobilization, Stair training, Vestibular training, Canalith repositioning, Visual/preceptual remediation/compensation, Orthotic/Fit training, DME instructions, Cognitive remediation, Electrical stimulation, Spinal mobilization, Cryotherapy, Moist heat, Splintting, Taping, Traction, Ultrasound, Manual therapy, and Re-evaluation  PLAN FOR NEXT SESSION:  balance, strength, unstable surfaces    Precious Bard, PT, DPT Physical Therapist - [facility]  Outpatient Physical Therapy- Main Campus [phone]

## 2023-08-05 DIAGNOSIS — H2513 Age-related nuclear cataract, bilateral: Secondary | ICD-10-CM | POA: Diagnosis not present

## 2023-08-05 DIAGNOSIS — H40013 Open angle with borderline findings, low risk, bilateral: Secondary | ICD-10-CM | POA: Diagnosis not present

## 2023-08-07 DIAGNOSIS — I10 Essential (primary) hypertension: Secondary | ICD-10-CM | POA: Diagnosis not present

## 2023-08-07 NOTE — Therapy (Signed)
OUTPATIENT PHYSICAL THERAPY NEURO TREATMENT   Patient Name: [name] MRN: [id number] DOB:[birth year], 62 y.o., male Today's Date: 08/08/2023   PCP: Larae Grooms NP REFERRING PROVIDER: Susanne Borders PA  END OF SESSION:  PT End of Session - 08/08/23 1413     Visit Number 26    Number of Visits 35    Date for PT Re-Evaluation 09/26/23    Authorization Type eval 2/21    PT Start Time 1412    PT Stop Time 1444    PT Time Calculation (min) 32 min    Equipment Utilized During Treatment Gait belt    Activity Tolerance Patient tolerated treatment well    Behavior During Therapy [facility] for tasks assessed/performed                                   Past Medical History:  Diagnosis Date   ADHD    Arthritis    Concussion    COVID 12/2020   Hypertension    Multiple lacunar infarcts (HCC)    MVP (mitral valve prolapse)    Pernicious anemia    [facility] spotted fever    Spinal stenosis    Vestibular migraine    Vitamin B12 deficiency    Vitamin D deficiency    Past Surgical History:  Procedure Laterality Date   ANTERIOR CERVICAL DECOMP/DISCECTOMY FUSION N/A 07/30/2022   Procedure: C4-7 ANTERIOR CERVICAL DISCECTOMY AND FUSION (GLOBUS HEDRON);  Surgeon: Venetia Night, MD;  Location: [facility] ORS;  Service: Neurosurgery;  Laterality: N/A;   BILATERAL CARPAL TUNNEL RELEASE     CERVICAL WOUND DEBRIDEMENT N/A 08/01/2022   Procedure: CERVICAL WOUND DEBRIDEMENT OF HEMATOMA;  Surgeon: Venetia Night, MD;  Location: [facility] ORS;  Service: Neurosurgery;  Laterality: N/A;   FRACTURE SURGERY Left 1984   intramedullary rod   hip bone transplant  1984   LEG SURGERY Left    oseotomy   REPAIR ANKLE LIGAMENT Right 1978   REPAIR KNEE LIGAMENT Bilateral    acl   TRIGGER FINGER RELEASE Bilateral    thumbs   Patient Active Problem List   Diagnosis Date Noted   Postoperative hematoma of musculoskeletal structure following musculoskeletal  procedure 08/01/2022   Cervical myelopathy (HCC) 07/30/2022   Cervical spinal stenosis 07/30/2022   Radiculopathy, cervical 07/30/2022   S/P cervical spinal fusion 07/30/2022   Vitamin D deficiency 06/25/2022   B12 deficiency 06/25/2022   Elevated LDL cholesterol level 06/25/2022   Prediabetes 03/27/2021   History of nausea- reported 01/27/2020 02/02/2020   Accidental fall from ladder 02/02/2020   Acute right-sided thoracic back pain 02/02/2020   Neck stiffness 02/02/2020   Concussion with no loss of consciousness 02/02/2020   Multiple falls 02/02/2020   Head trauma, initial encounter 02/02/2020   Acute post-traumatic headache, not intractable 02/02/2020   Essential hypertension 08/26/2018   Pre-bariatric surgery nutrition evaluation 06/19/2018   Migraine with vertigo 01/05/2014   Arthralgia of multiple joints 04/28/2009   Carpal tunnel syndrome 08/30/2008    ONSET DATE: summer 2022  REFERRING DIAG: LE weakness and gait instability   THERAPY DIAG:  Muscle weakness (generalized)  Unsteadiness on feet  Difficulty in walking, not elsewhere classified  Rationale for Evaluation and Treatment: Rehabilitation  SUBJECTIVE:  SUBJECTIVE STATEMENT: Patient is late to PT session, forgot he had a session and ran out of the house to make it.   Pt accompanied by: self  PERTINENT HISTORY:  Patient presents for LE weakness and gait instability. He is s/p ACDF C-7 on 07/30/22. Patient has a leg length discrepancy and limited L knee flexion since his 20's. Patient has a lot of atrophy and lower back pain, foot drop bilaterally.  First symptoms was stumbling up stairs, with LLE. Foot drop was improved after ACDF. Now has numbness and tingling at the end of the day. Does have a few lesions in his pons per wife,  on blood pressure medication now. PMH includes ADHD, arthritis, concussion COVID, HTN, multiple lacunar infarcts, MVP, pernicious anemia, rocky mountain spotted fever, spinal stenosis, vestibular migraine, vitamin B deficiency.   PAIN:  Are you having pain?  Denies pain   PRECAUTIONS: Other: cervical; no lifting more than 20 lb   WEIGHT BEARING RESTRICTIONS: No  FALLS: Has patient fallen in last 6 months? Yes. Number of falls multiple a week  LIVING ENVIRONMENT: Lives with: lives with their family Lives in: House/apartment Stairs: Yes: Internal: flight steps; is sleeping downstairs Has following equipment at home: Single point cane  PLOF: Independent, uses a walking stick  PATIENT GOALS: gait control of feet, strengthen legs, balance, atrophy in hand  OBJECTIVE:   DIAGNOSTIC FINDINGS:  09/11/22: No acute fracture. No spondylolisthesis. No compression deformities. Disc space narrowing C3-4 with marginal osteophytes consistent with degenerative disc disease. Postop changes ACDF C4-C7 with anatomic alignment. Normal prevertebral and cervicocranial soft tissues.   06/19/22: 1. L5 chronic pars defects with L5-S1 anterolisthesis and advanced disc degeneration causing marked biforaminal L5 impingement. 2. Degeneration and short pedicles causes compressive spinal stenosis from L1-2 to L3-4, most advanced at L2-3. Moderate bilateral foraminal narrowing at the same levels.   COGNITION: Overall cognitive status: Within functional limits for tasks assessed    COORDINATION: Limited LLE  MUSCLE LENGTH: Significant limitation L hamstring; 60 degree flexion deficit.   POSTURE:  weight shift to the R  LOWER EXTREMITY ROM:     L knee flexion contraction -60 contraction LOWER EXTREMITY MMT:    MMT Right Eval R 5/15 R 8/22 Left Eval L 5/15 L 8/22  Hip flexion 4 5 5 4  4+ 4  Hip extension        Hip abduction 4- 4 4 4- 4 4  Hip adduction 4- 4 4 4- 4- 4  Hip internal rotation         Hip external rotation        Knee flexion 4- 4 4 3  4- 4-  Knee extension 4 4+ 5 4 4  4-  Ankle dorsiflexion 3 3 3+ 2+ 2+ 2+  Ankle plantarflexion 3 3+ 4- 3 3+ 3+  Ankle inversion        Ankle eversion        (Blank rows = not tested)  01/02/23 Right 8/22: RECERT  10/17 PN Left 8/22: Recert LUE 10/17: PN   Shoulder Flexion 4 4+ 5 4- 4+ 4+  Shoulder Abduction 4- 4 4+ 4- 4 4+  Shoulder Extension 4- 4+ 4 4- 4   Shoulder adduction 4- 4  3+ 4 4+  Bicep  4 4+ 5 4 4+ 4  Triceps 4- 4- 5 3+ 4 4      BED MOBILITY:  Perform next session  TRANSFERS: Assistive device utilized: Single point cane  Sit to stand: CGA Stand to  sit: CGA Chair to chair: CGA   GAIT: Gait pattern: L knee limited extension, L foot drop, L foot eversion Distance walked: 1200 Assistive device utilized: Single point cane Level of assistance: CGA Comments: Patient has increased eversion and rolling of L foot, LLE leg length discrepancy.   FUNCTIONAL TESTS:  5 times sit to stand: 5.6 seconds with minor use of LLE no UE support Timed up and go (TUG): n/a 6 minute walk test: 1200 ft 10 meter walk test: 7 seconds BERG: 36/56   PATIENT SURVEYS:  FOTO 54  TODAY'S TREATMENT:                                                                                                                              DATE: 08/08/23 Unless otherwise stated, CGA was provided and gait belt donned in order to ensure pt safety  TherEx  BTB abduction 20x BTB march 20x   Wrist assessment for L wrist: painful to extension and pronation.      Neuro Re-ed Standing with CGA next to support surface:  Airex pad: static stand 30 seconds x 2 trials, noticeable trembling of ankles/LE's with fatigue and challenge to maintain stability Airex pad: horizontal head turns 30 seconds scanning room 10x ; cueing for arc of motion  Airex pad: eyes closed 2x30 seconds Airex pad: vertical head turns 30 seconds, cueing for arc of motion, noticeable  sway with upward gaze increasing demand on ankle righting reaction musculature Airex pad: one foot on 6" step one foot on airex pad, hold position for 30 seconds, switch legs, 2x each LE; Airex dual task word game x 6 minutes    PATIENT EDUCATION: Education details: goals, POC, educated on safety during updated exercises  Person educated: Patient and Spouse Education method: Explanation, Demonstration, Tactile cues, and Verbal cues Education comprehension: verbalized understanding, returned demonstration, verbal cues required, and tactile cues required  HOME EXERCISE PROGRAM: Access Code: [email or web address] URL: [email or web address] Date: 10/11/2022 Prepared by: Precious Bard  Exercises - Seated Ankle Alphabet  - 1 x daily - 7 x weekly - 2 sets - 10 reps - 5 hold - Seated Heel Toe Raises  - 1 x daily - 7 x weekly - 2 sets - 10 reps - 5 hold - Standing March with Counter Support  - 1 x daily - 7 x weekly - 2 sets - 10 reps - 5 hold  Access Code: [email or web address] URL: [email or web address] Date: 10/30/2022 Prepared by: Precious Bard  Exercises - Seated Ankle Plantarflexion with Resistance  - 1 x daily - 7 x weekly - 2 sets - 10 reps - 5 hold - Seated Ankle Eversion with Resistance  - 1 x daily - 7 x weekly - 2 sets - 10 reps - 5 hold  Access Code: [email or web address] URL: [email or web address] Date: 11/14/2022 Prepared by: Precious Bard  Exercises - Seated Heel Toe Raises  - 1 x daily - 7 x weekly - 2 sets - 10 reps -  5 hold - Seated Hip Internal Rotation AROM  - 1 x daily - 7 x weekly - 2 sets - 10 reps - 5 hold - Bilateral Long Arc Quad  - 1 x daily - 7 x weekly - 2 sets - 10 reps - 5 hold - Foam Balance Tandem stance  - 1 x daily - 7 x weekly - 2 sets - 2 reps - 30 hold - Single Leg Balance on Foam  - 1 x daily - 7 x weekly - 2 sets - 30 reps - 2 hold - Seated Hip Flexion March with Ankle Weights  - 1 x daily - 7 x weekly - 2 sets - 10 reps - 5 hold - Standing  Hip Abduction with Ankle Weight  - 1 x daily - 7 x weekly - 2 sets - 10 reps - 5 hold - Standing Hip Extension with Ankle Weight  - 1 x daily - 7 x weekly - 2 sets - 10 reps - 5 hold - Standing Hip Flexion with Ankle Weight  - 1 x daily - 7 x weekly - 2 sets - 10 reps - 5 hold - Seated Long Arc Quad with Ankle Weight  - 1 x daily - 7 x weekly - 2 sets - 10 reps - 5 hold   GOALS: Goals reviewed with patient? Yes  SHORT TERM GOALS: Target date: 05/09/2023   Patient will be independent in home exercise program to improve strength/mobility for better functional independence with ADLs. Baseline: 2/21: HEP give next session 5/15 : intermittent compliance 8/22: intermittent compliance due to illness 11/14: compliant  Goal status: MET    LONG TERM GOALS: Target date: 09/26/2023  Patient will increase FOTO score to equal to or greater than  64%   to demonstrate statistically significant improvement in mobility and quality of life.  Baseline: 2/21: 54% 8/22: 52% 10/17: 57% 11/14: 53% Goal status: On going   2.  Patient will increase Berg Balance score by > 6 points (42/56)to demonstrate decreased fall risk during functional activities. Baseline: 2/21: 36/56 5/15: 45   Goal status: MET  3.  Patient will increase six minute walk test distance to >1500 for progression to age norm community ambulator and improve gait ability Baseline: 2/21: 1200 ft with [facility] 5/15: 1300 ft with [facility]  8/22: 905 ft with [facility] 10/17: 1100 ft with [facility] 11/14: 1110 ft with [facility]  Goal status: In progress  4.  Patient will increase BLE gross strength to 4+/5 as to improve functional strength for independent gait, increased standing tolerance and increased ADL ability. Baseline: 2/21 :see above 5/15: see above 10/17: RLE 5/5 LLE 4/5 L ankle 3/5  11/14: RLE 5/5 LLE 4+/5 hamstring 4/5, ankle: 3+/5 Goal status:  In progress   6.  Patient will increase Berg Balance score by > 6 points (51/56) to demonstrate decreased fall risk  during functional activities. Baseline:  5/15: 45 8/22: 41/56 10/17: 28 11/14: 36  Goal status: Ongoing  7.   Patient will increase UE strength to >4+/5 for ADLs, AD usage, and quality of life.  Baseline: 5/15: see above 8/22: see above 11/14: >4+/5 bilaterally  Goal status: MET  8.   Patient will increase lower extremity functional scale to >60/80 to demonstrate improved functional mobility and increased tolerance with ADLs.  Baseline: 11/14: 29%  Goal status: Initial   ASSESSMENT:  CLINICAL IMPRESSION: Patient session limited by late arrival. He does have new onset of wrist pain, reports is chronic  but wants to know if he should wear a brace. Patient tolerates stability exercises well this session.  Patient will benefit from skilled physical therapy to improve strength, mobility, and stability for improved quality of life.   OBJECTIVE IMPAIRMENTS: Abnormal gait, decreased activity tolerance, decreased balance, decreased coordination, decreased endurance, decreased mobility, difficulty walking, decreased ROM, decreased strength, hypomobility, impaired flexibility, improper body mechanics, and postural dysfunction.   ACTIVITY LIMITATIONS: carrying, lifting, bending, standing, squatting, stairs, transfers, reach over head, locomotion level, and caring for others  PARTICIPATION LIMITATIONS: meal prep, cleaning, laundry, personal finances, driving, shopping, community activity, and yard work  PERSONAL FACTORS: Age, Past/current experiences, Time since onset of injury/illness/exacerbation, and 3+ comorbidities: DHD, arthritis, concussion COVID, HTN, multiple lacunar infarcts, MVP, pernicious anemia, rocky mountain spotted fever, spinal stenosis, vestibular migraine, vitamin B deficiency.   are also affecting patient's functional outcome.   REHAB POTENTIAL: Good  CLINICAL DECISION MAKING: Evolving/moderate complexity  EVALUATION COMPLEXITY: Moderate  PLAN:   PT FREQUENCY: 1x/week  PT  DURATION: 12 weeks  PLANNED INTERVENTIONS: Therapeutic exercises, Therapeutic activity, Neuromuscular re-education, Balance training, Gait training, Patient/Family education, Self Care, Joint mobilization, Stair training, Vestibular training, Canalith repositioning, Visual/preceptual remediation/compensation, Orthotic/Fit training, DME instructions, Cognitive remediation, Electrical stimulation, Spinal mobilization, Cryotherapy, Moist heat, Splintting, Taping, Traction, Ultrasound, Manual therapy, and Re-evaluation  PLAN FOR NEXT SESSION:  balance, strength, unstable surfaces    Precious Bard, PT, DPT Physical Therapist - [facility]  Outpatient Physical Therapy- Main Campus [phone]

## 2023-08-08 ENCOUNTER — Ambulatory Visit: Payer: 59

## 2023-08-08 DIAGNOSIS — F840 Autistic disorder: Secondary | ICD-10-CM | POA: Diagnosis not present

## 2023-08-08 DIAGNOSIS — M6281 Muscle weakness (generalized): Secondary | ICD-10-CM

## 2023-08-08 DIAGNOSIS — R2681 Unsteadiness on feet: Secondary | ICD-10-CM

## 2023-08-08 DIAGNOSIS — R4681 Obsessive-compulsive behavior: Secondary | ICD-10-CM | POA: Diagnosis not present

## 2023-08-08 DIAGNOSIS — R262 Difficulty in walking, not elsewhere classified: Secondary | ICD-10-CM | POA: Diagnosis not present

## 2023-08-08 DIAGNOSIS — F0670 Mild neurocognitive disorder due to known physiological condition without behavioral disturbance: Secondary | ICD-10-CM | POA: Diagnosis not present

## 2023-08-13 NOTE — Therapy (Signed)
OUTPATIENT PHYSICAL THERAPY NEURO TREATMENT   Patient Name: [name] MRN: [id number] DOB:12/19/1960, 62 y.o., male Today's Date: 08/15/2023   PCP: Larae Grooms NP REFERRING PROVIDER: Susanne Borders PA  END OF SESSION:  PT End of Session - 08/15/23 1402     Visit Number 27    Number of Visits 35    Date for PT Re-Evaluation 09/26/23    Authorization Type eval 2/21    PT Start Time 1402    PT Stop Time 1444    PT Time Calculation (min) 42 min    Equipment Utilized During Treatment Gait belt    Activity Tolerance Patient tolerated treatment well    Behavior During Therapy WFL for tasks assessed/performed                                    Past Medical History:  Diagnosis Date   ADHD    Arthritis    Concussion    COVID 12/2020   Hypertension    Multiple lacunar infarcts (HCC)    MVP (mitral valve prolapse)    Pernicious anemia    [facility] spotted fever    Spinal stenosis    Vestibular migraine    Vitamin B12 deficiency    Vitamin D deficiency    Past Surgical History:  Procedure Laterality Date   ANTERIOR CERVICAL DECOMP/DISCECTOMY FUSION N/A 07/30/2022   Procedure: C4-7 ANTERIOR CERVICAL DISCECTOMY AND FUSION (GLOBUS HEDRON);  Surgeon: Venetia Night, MD;  Location: [facility] ORS;  Service: Neurosurgery;  Laterality: N/A;   BILATERAL CARPAL TUNNEL RELEASE     CERVICAL WOUND DEBRIDEMENT N/A 08/01/2022   Procedure: CERVICAL WOUND DEBRIDEMENT OF HEMATOMA;  Surgeon: Venetia Night, MD;  Location: [facility] ORS;  Service: Neurosurgery;  Laterality: N/A;   FRACTURE SURGERY Left 1984   intramedullary rod   hip bone transplant  1984   LEG SURGERY Left    oseotomy   REPAIR ANKLE LIGAMENT Right 1978   REPAIR KNEE LIGAMENT Bilateral    acl   TRIGGER FINGER RELEASE Bilateral    thumbs   Patient Active Problem List   Diagnosis Date Noted   Postoperative hematoma of musculoskeletal structure following musculoskeletal  procedure 08/01/2022   Cervical myelopathy (HCC) 07/30/2022   Cervical spinal stenosis 07/30/2022   Radiculopathy, cervical 07/30/2022   S/P cervical spinal fusion 07/30/2022   Vitamin D deficiency 06/25/2022   B12 deficiency 06/25/2022   Elevated LDL cholesterol level 06/25/2022   Prediabetes 03/27/2021   History of nausea- reported 01/27/2020 02/02/2020   Accidental fall from ladder 02/02/2020   Acute right-sided thoracic back pain 02/02/2020   Neck stiffness 02/02/2020   Concussion with no loss of consciousness 02/02/2020   Multiple falls 02/02/2020   Head trauma, initial encounter 02/02/2020   Acute post-traumatic headache, not intractable 02/02/2020   Essential hypertension 08/26/2018   Pre-bariatric surgery nutrition evaluation 06/19/2018   Migraine with vertigo 01/05/2014   Arthralgia of multiple joints 04/28/2009   Carpal tunnel syndrome 08/30/2008    ONSET DATE: summer 2022  REFERRING DIAG: LE weakness and gait instability   THERAPY DIAG:  Muscle weakness (generalized)  Unsteadiness on feet  Difficulty in walking, not elsewhere classified  Rationale for Evaluation and Treatment: Rehabilitation  SUBJECTIVE:  SUBJECTIVE STATEMENT: Patient has started to wear a L wrist brace. Had a near fall but was able to catch self.   Pt accompanied by: self  PERTINENT HISTORY:  Patient presents for LE weakness and gait instability. He is s/p ACDF C-7 on 07/30/22. Patient has a leg length discrepancy and limited L knee flexion since his 20's. Patient has a lot of atrophy and lower back pain, foot drop bilaterally.  First symptoms was stumbling up stairs, with LLE. Foot drop was improved after ACDF. Now has numbness and tingling at the end of the day. Does have a few lesions in his pons per wife, on  blood pressure medication now. PMH includes ADHD, arthritis, concussion COVID, HTN, multiple lacunar infarcts, MVP, pernicious anemia, rocky mountain spotted fever, spinal stenosis, vestibular migraine, vitamin B deficiency.   PAIN:  Are you having pain?  Denies pain   PRECAUTIONS: Other: cervical; no lifting more than 20 lb   WEIGHT BEARING RESTRICTIONS: No  FALLS: Has patient fallen in last 6 months? Yes. Number of falls multiple a week  LIVING ENVIRONMENT: Lives with: lives with their family Lives in: House/apartment Stairs: Yes: Internal: flight steps; is sleeping downstairs Has following equipment at home: Single point cane  PLOF: Independent, uses a walking stick  PATIENT GOALS: gait control of feet, strengthen legs, balance, atrophy in hand  OBJECTIVE:   DIAGNOSTIC FINDINGS:  09/11/22: No acute fracture. No spondylolisthesis. No compression deformities. Disc space narrowing C3-4 with marginal osteophytes consistent with degenerative disc disease. Postop changes ACDF C4-C7 with anatomic alignment. Normal prevertebral and cervicocranial soft tissues.   06/19/22: 1. L5 chronic pars defects with L5-S1 anterolisthesis and advanced disc degeneration causing marked biforaminal L5 impingement. 2. Degeneration and short pedicles causes compressive spinal stenosis from L1-2 to L3-4, most advanced at L2-3. Moderate bilateral foraminal narrowing at the same levels.   COGNITION: Overall cognitive status: Within functional limits for tasks assessed    COORDINATION: Limited LLE  MUSCLE LENGTH: Significant limitation L hamstring; 60 degree flexion deficit.   POSTURE:  weight shift to the R  LOWER EXTREMITY ROM:     L knee flexion contraction -60 contraction LOWER EXTREMITY MMT:    MMT Right Eval R 5/15 R 8/22 Left Eval L 5/15 L 8/22  Hip flexion 4 5 5 4  4+ 4  Hip extension        Hip abduction 4- 4 4 4- 4 4  Hip adduction 4- 4 4 4- 4- 4  Hip internal rotation         Hip external rotation        Knee flexion 4- 4 4 3  4- 4-  Knee extension 4 4+ 5 4 4  4-  Ankle dorsiflexion 3 3 3+ 2+ 2+ 2+  Ankle plantarflexion 3 3+ 4- 3 3+ 3+  Ankle inversion        Ankle eversion        (Blank rows = not tested)  01/02/23 Right 8/22: RECERT  10/17 PN Left 8/22: Recert LUE 10/17: PN   Shoulder Flexion 4 4+ 5 4- 4+ 4+  Shoulder Abduction 4- 4 4+ 4- 4 4+  Shoulder Extension 4- 4+ 4 4- 4   Shoulder adduction 4- 4  3+ 4 4+  Bicep  4 4+ 5 4 4+ 4  Triceps 4- 4- 5 3+ 4 4      BED MOBILITY:  Perform next session  TRANSFERS: Assistive device utilized: Single point cane  Sit to stand: CGA Stand to sit:  CGA Chair to chair: CGA   GAIT: Gait pattern: L knee limited extension, L foot drop, L foot eversion Distance walked: 1200 Assistive device utilized: Single point cane Level of assistance: CGA Comments: Patient has increased eversion and rolling of L foot, LLE leg length discrepancy.   FUNCTIONAL TESTS:  5 times sit to stand: 5.6 seconds with minor use of LLE no UE support Timed up and go (TUG): n/a 6 minute walk test: 1200 ft 10 meter walk test: 7 seconds BERG: 36/56   PATIENT SURVEYS:  FOTO 54  TODAY'S TREATMENT:                                                                                                                              DATE: 08/15/23 Unless otherwise stated, CGA was provided and gait belt donned in order to ensure pt safety  TherEx 6" step toe taps 15x each LE 6" step: step up/down 12x each LE with UE support 6" step lateral step up/down 12x each LE   Black TB abduction 20x Black TB march 20x   Slider:  -hamstring curl 20x each LE; 2 sets -eversion/inversion 20x      Neuro Re-ed Bosu ball: flat side up static stand 30 seconds x2 sets Bosu ball round side up: -crane step up 12x each LE -lateral crane step up 10x each LE     PATIENT EDUCATION: Education details: goals, POC, educated on safety during updated exercises   Person educated: Patient and Spouse Education method: Explanation, Demonstration, Tactile cues, and Verbal cues Education comprehension: verbalized understanding, returned demonstration, verbal cues required, and tactile cues required  HOME EXERCISE PROGRAM: Access Code: [email or web address] URL: [email or web address] Date: 10/11/2022 Prepared by: Precious Bard  Exercises - Seated Ankle Alphabet  - 1 x daily - 7 x weekly - 2 sets - 10 reps - 5 hold - Seated Heel Toe Raises  - 1 x daily - 7 x weekly - 2 sets - 10 reps - 5 hold - Standing March with Counter Support  - 1 x daily - 7 x weekly - 2 sets - 10 reps - 5 hold  Access Code: [email or web address] URL: [email or web address] Date: 10/30/2022 Prepared by: Precious Bard  Exercises - Seated Ankle Plantarflexion with Resistance  - 1 x daily - 7 x weekly - 2 sets - 10 reps - 5 hold - Seated Ankle Eversion with Resistance  - 1 x daily - 7 x weekly - 2 sets - 10 reps - 5 hold  Access Code: [email or web address] URL: [email or web address] Date: 11/14/2022 Prepared by: Precious Bard  Exercises - Seated Heel Toe Raises  - 1 x daily - 7 x weekly - 2 sets - 10 reps - 5 hold - Seated Hip Internal Rotation AROM  - 1 x daily - 7 x weekly - 2 sets - 10 reps - 5 hold - Bilateral Long Arc Quad  - 1 x daily - 7 x weekly - 2 sets - 10 reps -  5 hold - Foam Balance Tandem stance  - 1 x daily - 7 x weekly - 2 sets - 2 reps - 30 hold - Single Leg Balance on Foam  - 1 x daily - 7 x weekly - 2 sets - 30 reps - 2 hold - Seated Hip Flexion March with Ankle Weights  - 1 x daily - 7 x weekly - 2 sets - 10 reps - 5 hold - Standing Hip Abduction with Ankle Weight  - 1 x daily - 7 x weekly - 2 sets - 10 reps - 5 hold - Standing Hip Extension with Ankle Weight  - 1 x daily - 7 x weekly - 2 sets - 10 reps - 5 hold - Standing Hip Flexion with Ankle Weight  - 1 x daily - 7 x weekly - 2 sets - 10 reps - 5 hold - Seated Long Arc Quad with Ankle Weight  - 1 x  daily - 7 x weekly - 2 sets - 10 reps - 5 hold   GOALS: Goals reviewed with patient? Yes  SHORT TERM GOALS: Target date: 05/09/2023   Patient will be independent in home exercise program to improve strength/mobility for better functional independence with ADLs. Baseline: 2/21: HEP give next session 5/15 : intermittent compliance 8/22: intermittent compliance due to illness 11/14: compliant  Goal status: MET    LONG TERM GOALS: Target date: 09/26/2023  Patient will increase FOTO score to equal to or greater than  64%   to demonstrate statistically significant improvement in mobility and quality of life.  Baseline: 2/21: 54% 8/22: 52% 10/17: 57% 11/14: 53% Goal status: On going   2.  Patient will increase Berg Balance score by > 6 points (42/56)to demonstrate decreased fall risk during functional activities. Baseline: 2/21: 36/56 5/15: 45   Goal status: MET  3.  Patient will increase six minute walk test distance to >1500 for progression to age norm community ambulator and improve gait ability Baseline: 2/21: 1200 ft with [facility] 5/15: 1300 ft with [facility]  8/22: 905 ft with [facility] 10/17: 1100 ft with [facility] 11/14: 1110 ft with [facility]  Goal status: In progress  4.  Patient will increase BLE gross strength to 4+/5 as to improve functional strength for independent gait, increased standing tolerance and increased ADL ability. Baseline: 2/21 :see above 5/15: see above 10/17: RLE 5/5 LLE 4/5 L ankle 3/5  11/14: RLE 5/5 LLE 4+/5 hamstring 4/5, ankle: 3+/5 Goal status:  In progress   6.  Patient will increase Berg Balance score by > 6 points (51/56) to demonstrate decreased fall risk during functional activities. Baseline:  5/15: 45 8/22: 41/56 10/17: 28 11/14: 36  Goal status: Ongoing  7.   Patient will increase UE strength to >4+/5 for ADLs, AD usage, and quality of life.  Baseline: 5/15: see above 8/22: see above 11/14: >4+/5 bilaterally  Goal status: MET  8.   Patient will increase lower extremity  functional scale to >60/80 to demonstrate improved functional mobility and increased tolerance with ADLs.  Baseline: 11/14: 29%  Goal status: Initial   ASSESSMENT:  CLINICAL IMPRESSION:  Patient tolerates LE strengthening and stability with LLE fatiguing quicker. Patient highly motivated throughout session. Eversion continues to be challenging as well as single limb stance. Patient will benefit from skilled physical therapy to improve strength, mobility, and stability for improved quality of life.   OBJECTIVE IMPAIRMENTS: Abnormal gait, decreased activity tolerance, decreased balance, decreased coordination, decreased endurance, decreased mobility, difficulty walking,  decreased ROM, decreased strength, hypomobility, impaired flexibility, improper body mechanics, and postural dysfunction.   ACTIVITY LIMITATIONS: carrying, lifting, bending, standing, squatting, stairs, transfers, reach over head, locomotion level, and caring for others  PARTICIPATION LIMITATIONS: meal prep, cleaning, laundry, personal finances, driving, shopping, community activity, and yard work  PERSONAL FACTORS: Age, Past/current experiences, Time since onset of injury/illness/exacerbation, and 3+ comorbidities: DHD, arthritis, concussion COVID, HTN, multiple lacunar infarcts, MVP, pernicious anemia, rocky mountain spotted fever, spinal stenosis, vestibular migraine, vitamin B deficiency.   are also affecting patient's functional outcome.   REHAB POTENTIAL: Good  CLINICAL DECISION MAKING: Evolving/moderate complexity  EVALUATION COMPLEXITY: Moderate  PLAN:   PT FREQUENCY: 1x/week  PT DURATION: 12 weeks  PLANNED INTERVENTIONS: Therapeutic exercises, Therapeutic activity, Neuromuscular re-education, Balance training, Gait training, Patient/Family education, Self Care, Joint mobilization, Stair training, Vestibular training, Canalith repositioning, Visual/preceptual remediation/compensation, Orthotic/Fit training, DME  instructions, Cognitive remediation, Electrical stimulation, Spinal mobilization, Cryotherapy, Moist heat, Splintting, Taping, Traction, Ultrasound, Manual therapy, and Re-evaluation  PLAN FOR NEXT SESSION:  balance, strength, unstable surfaces    Precious Bard, PT, DPT Physical Therapist - [facility]  Outpatient Physical Therapy- Main Campus [phone]

## 2023-08-15 ENCOUNTER — Ambulatory Visit: Payer: 59

## 2023-08-15 DIAGNOSIS — R2681 Unsteadiness on feet: Secondary | ICD-10-CM | POA: Diagnosis not present

## 2023-08-15 DIAGNOSIS — R262 Difficulty in walking, not elsewhere classified: Secondary | ICD-10-CM | POA: Diagnosis not present

## 2023-08-15 DIAGNOSIS — F840 Autistic disorder: Secondary | ICD-10-CM | POA: Diagnosis not present

## 2023-08-15 DIAGNOSIS — R4681 Obsessive-compulsive behavior: Secondary | ICD-10-CM | POA: Diagnosis not present

## 2023-08-15 DIAGNOSIS — F0670 Mild neurocognitive disorder due to known physiological condition without behavioral disturbance: Secondary | ICD-10-CM | POA: Diagnosis not present

## 2023-08-15 DIAGNOSIS — M6281 Muscle weakness (generalized): Secondary | ICD-10-CM | POA: Diagnosis not present

## 2023-08-16 ENCOUNTER — Telehealth: Payer: Self-pay | Admitting: Nurse Practitioner

## 2023-08-16 NOTE — Telephone Encounter (Signed)
Prefilled and back in providers box.

## 2023-08-16 NOTE — Telephone Encounter (Signed)
PT's wife dropped off Parking Placard form to be filled out by provider.  Placed in provider's folder.

## 2023-08-19 NOTE — Telephone Encounter (Signed)
Form completed.

## 2023-08-19 NOTE — Telephone Encounter (Signed)
Copy of form placed in scan bin and original placed in bin for pick up. Called and notified patient's wife that form was ready to be picked up.

## 2023-08-20 NOTE — Therapy (Signed)
 OUTPATIENT PHYSICAL THERAPY NEURO TREATMENT   Patient Name: [name] MRN: [id number] DOB:01-17-1961, 62 y.o., male Today's Date: 08/22/2023   PCP: Melvin Pao NP REFERRING PROVIDER: Gregory Edsel Ruth PA  END OF SESSION:  PT End of Session - 08/22/23 1411     Visit Number 28    Number of Visits 35    Date for PT Re-Evaluation 09/26/23    Authorization Type eval 2/21    PT Start Time 1411    PT Stop Time 1444    PT Time Calculation (min) 33 min    Equipment Utilized During Treatment Gait belt    Activity Tolerance Patient tolerated treatment well    Behavior During Therapy [facility] for tasks assessed/performed                                     Past Medical History:  Diagnosis Date   ADHD    Arthritis    Concussion    COVID 12/2020   Hypertension    Multiple lacunar infarcts (HCC)    MVP (mitral valve prolapse)    Pernicious anemia    [facility] spotted fever    Spinal stenosis    Vestibular migraine    Vitamin B12 deficiency    Vitamin D  deficiency    Past Surgical History:  Procedure Laterality Date   ANTERIOR CERVICAL DECOMP/DISCECTOMY FUSION N/A 07/30/2022   Procedure: C4-7 ANTERIOR CERVICAL DISCECTOMY AND FUSION (GLOBUS HEDRON);  Surgeon: Clois Fret, MD;  Location: [facility] ORS;  Service: Neurosurgery;  Laterality: N/A;   BILATERAL CARPAL TUNNEL RELEASE     CERVICAL WOUND DEBRIDEMENT N/A 08/01/2022   Procedure: CERVICAL WOUND DEBRIDEMENT OF HEMATOMA;  Surgeon: Clois Fret, MD;  Location: [facility] ORS;  Service: Neurosurgery;  Laterality: N/A;   FRACTURE SURGERY Left 1984   intramedullary rod   hip bone transplant  1984   LEG SURGERY Left    oseotomy   REPAIR ANKLE LIGAMENT Right 1978   REPAIR KNEE LIGAMENT Bilateral    acl   TRIGGER FINGER RELEASE Bilateral    thumbs   Patient Active Problem List   Diagnosis Date Noted   Postoperative hematoma of musculoskeletal structure following musculoskeletal  procedure 08/01/2022   Cervical myelopathy (HCC) 07/30/2022   Cervical spinal stenosis 07/30/2022   Radiculopathy, cervical 07/30/2022   S/P cervical spinal fusion 07/30/2022   Vitamin D  deficiency 06/25/2022   B12 deficiency 06/25/2022   Elevated LDL cholesterol level 06/25/2022   Prediabetes 03/27/2021   History of nausea- reported 01/27/2020 02/02/2020   Accidental fall from ladder 02/02/2020   Acute right-sided thoracic back pain 02/02/2020   Neck stiffness 02/02/2020   Concussion with no loss of consciousness 02/02/2020   Multiple falls 02/02/2020   Head trauma, initial encounter 02/02/2020   Acute post-traumatic headache, not intractable 02/02/2020   Essential hypertension 08/26/2018   Pre-bariatric surgery nutrition evaluation 06/19/2018   Migraine with vertigo 01/05/2014   Arthralgia of multiple joints 04/28/2009   Carpal tunnel syndrome 08/30/2008    ONSET DATE: summer 2022  REFERRING DIAG: LE weakness and gait instability   THERAPY DIAG:  Muscle weakness (generalized)  Unsteadiness on feet  Difficulty in walking, not elsewhere classified  Rationale for Evaluation and Treatment: Rehabilitation  SUBJECTIVE:  SUBJECTIVE STATEMENT: Patient reports he has been using his cycle. Reports no falls but a few stumbles.   Pt accompanied by: self  PERTINENT HISTORY:  Patient presents for LE weakness and gait instability. He is s/p ACDF C-7 on 07/30/22. Patient has a leg length discrepancy and limited L knee flexion since his 20's. Patient has a lot of atrophy and lower back pain, foot drop bilaterally.  First symptoms was stumbling up stairs, with LLE. Foot drop was improved after ACDF. Now has numbness and tingling at the end of the day. Does have a few lesions in his pons per wife, on blood  pressure medication now. PMH includes ADHD, arthritis, concussion COVID, HTN, multiple lacunar infarcts, MVP, pernicious anemia, rocky mountain spotted fever, spinal stenosis, vestibular migraine, vitamin B deficiency.   PAIN:  Are you having pain?  Denies pain   PRECAUTIONS: Other: cervical; no lifting more than 20 lb   WEIGHT BEARING RESTRICTIONS: No  FALLS: Has patient fallen in last 6 months? Yes. Number of falls multiple a week  LIVING ENVIRONMENT: Lives with: lives with their family Lives in: House/apartment Stairs: Yes: Internal: flight steps; is sleeping downstairs Has following equipment at home: Single point cane  PLOF: Independent, uses a walking stick  PATIENT GOALS: gait control of feet, strengthen legs, balance, atrophy in hand  OBJECTIVE:   DIAGNOSTIC FINDINGS:  09/11/22: No acute fracture. No spondylolisthesis. No compression deformities. Disc space narrowing C3-4 with marginal osteophytes consistent with degenerative disc disease. Postop changes ACDF C4-C7 with anatomic alignment. Normal prevertebral and cervicocranial soft tissues.   06/19/22: 1. L5 chronic pars defects with L5-S1 anterolisthesis and advanced disc degeneration causing marked biforaminal L5 impingement. 2. Degeneration and short pedicles causes compressive spinal stenosis from L1-2 to L3-4, most advanced at L2-3. Moderate bilateral foraminal narrowing at the same levels.   COGNITION: Overall cognitive status: Within functional limits for tasks assessed    COORDINATION: Limited LLE  MUSCLE LENGTH: Significant limitation L hamstring; 60 degree flexion deficit.   POSTURE:  weight shift to the R  LOWER EXTREMITY ROM:     L knee flexion contraction -60 contraction LOWER EXTREMITY MMT:    MMT Right Eval R 5/15 R 8/22 Left Eval L 5/15 L 8/22  Hip flexion 4 5 5 4  4+ 4  Hip extension        Hip abduction 4- 4 4 4- 4 4  Hip adduction 4- 4 4 4- 4- 4  Hip internal rotation        Hip  external rotation        Knee flexion 4- 4 4 3  4- 4-  Knee extension 4 4+ 5 4 4  4-  Ankle dorsiflexion 3 3 3+ 2+ 2+ 2+  Ankle plantarflexion 3 3+ 4- 3 3+ 3+  Ankle inversion        Ankle eversion        (Blank rows = not tested)  01/02/23 Right 8/22: RECERT  10/17 PN Left 8/22: Recert LUE 10/17: PN   Shoulder Flexion 4 4+ 5 4- 4+ 4+  Shoulder Abduction 4- 4 4+ 4- 4 4+  Shoulder Extension 4- 4+ 4 4- 4   Shoulder adduction 4- 4  3+ 4 4+  Bicep  4 4+ 5 4 4+ 4  Triceps 4- 4- 5 3+ 4 4      BED MOBILITY:  Perform next session  TRANSFERS: Assistive device utilized: Single point cane  Sit to stand: CGA Stand to sit: CGA Chair to chair:  CGA   GAIT: Gait pattern: L knee limited extension, L foot drop, L foot eversion Distance walked: 1200 Assistive device utilized: Single point cane Level of assistance: CGA Comments: Patient has increased eversion and rolling of L foot, LLE leg length discrepancy.   FUNCTIONAL TESTS:  5 times sit to stand: 5.6 seconds with minor use of LLE no UE support Timed up and go (TUG): n/a 6 minute walk test: 1200 ft 10 meter walk test: 7 seconds BERG: 36/56   PATIENT SURVEYS:  FOTO 54  TODAY'S TREATMENT:                                                                                                                              DATE: 08/22/23 Unless otherwise stated, CGA was provided and gait belt donned in order to ensure pt safety  TherEx   L arm finger wall walk 5x L arm towel slide 10x Medium soft putty: -hand squeezes 10x each hand -finger pinches 10x each hand -key finger pinch 10x each hand    Neuro Re-ed  Standing with CGA next to support surface:  Airex pad: static stand 30 seconds x 2 trials, noticeable trembling of ankles/LE's with fatigue and challenge to maintain stability Airex pad: horizontal head turns 30 seconds scanning room 10x ; cueing for arc of motion  Airex pad: vertical head turns 30 seconds, cueing for arc of  motion, noticeable sway with upward gaze increasing demand on ankle righting reaction musculature Airex pad: one foot on 6 step one foot on airex pad, hold position for 30 seconds, switch legs, 2x each LE;   Arm in to sleeve 10x;  Opposite arm into sleeve fo full donning of coat 10x  Opposite arm/leg raises 10x seated; progressed to standing 10x   PATIENT EDUCATION: Education details: goals, POC, educated on safety during updated exercises  Person educated: Patient and Spouse Education method: Explanation, Demonstration, Tactile cues, and Verbal cues Education comprehension: verbalized understanding, returned demonstration, verbal cues required, and tactile cues required  HOME EXERCISE PROGRAM: Access Code: [email or web address] URL: [email or web address] Date: 10/11/2022 Prepared by: Eavan Gonterman  Exercises - Seated Ankle Alphabet  - 1 x daily - 7 x weekly - 2 sets - 10 reps - 5 hold - Seated Heel Toe Raises  - 1 x daily - 7 x weekly - 2 sets - 10 reps - 5 hold - Standing March with Counter Support  - 1 x daily - 7 x weekly - 2 sets - 10 reps - 5 hold  Access Code: [email or web address] URL: [email or web address] Date: 10/30/2022 Prepared by: Rose-Marie Hickling  Exercises - Seated Ankle Plantarflexion with Resistance  - 1 x daily - 7 x weekly - 2 sets - 10 reps - 5 hold - Seated Ankle Eversion with Resistance  - 1 x daily - 7 x weekly - 2 sets - 10 reps - 5 hold  Access Code: [email or web address] URL: [email or web address] Date: 11/14/2022 Prepared by: Channon Ambrosini  Exercises -  Seated Heel Toe Raises  - 1 x daily - 7 x weekly - 2 sets - 10 reps - 5 hold - Seated Hip Internal Rotation AROM  - 1 x daily - 7 x weekly - 2 sets - 10 reps - 5 hold - Bilateral Long Arc Quad  - 1 x daily - 7 x weekly - 2 sets - 10 reps - 5 hold - Foam Balance Tandem stance  - 1 x daily - 7 x weekly - 2 sets - 2 reps - 30 hold - Single Leg Balance on Foam  - 1 x daily - 7 x weekly - 2 sets - 30  reps - 2 hold - Seated Hip Flexion March with Ankle Weights  - 1 x daily - 7 x weekly - 2 sets - 10 reps - 5 hold - Standing Hip Abduction with Ankle Weight  - 1 x daily - 7 x weekly - 2 sets - 10 reps - 5 hold - Standing Hip Extension with Ankle Weight  - 1 x daily - 7 x weekly - 2 sets - 10 reps - 5 hold - Standing Hip Flexion with Ankle Weight  - 1 x daily - 7 x weekly - 2 sets - 10 reps - 5 hold - Seated Long Arc Quad with Ankle Weight  - 1 x daily - 7 x weekly - 2 sets - 10 reps - 5 hold Access Code: [email or web address] URL: [email or web address] Date: 08/22/2023 Prepared by: Eladia Frame  Exercises - Seated Finger Tip Pinch with Putty  - 1 x daily - 7 x weekly - 2 sets - 10 reps - 5 hold - Hand Squeezes  - 1 x daily - 7 x weekly - 2 sets - 10 reps - 5 hold - Key Pinch with Putty  - 1 x daily - 7 x weekly - 2 sets - 10 reps - 5 hold  GOALS: Goals reviewed with patient? Yes  SHORT TERM GOALS: Target date: 05/09/2023   Patient will be independent in home exercise program to improve strength/mobility for better functional independence with ADLs. Baseline: 2/21: HEP give next session 5/15 : intermittent compliance 8/22: intermittent compliance due to illness 11/14: compliant  Goal status: MET    LONG TERM GOALS: Target date: 09/26/2023  Patient will increase FOTO score to equal to or greater than  64%   to demonstrate statistically significant improvement in mobility and quality of life.  Baseline: 2/21: 54% 8/22: 52% 10/17: 57% 11/14: 53% Goal status: On going   2.  Patient will increase Berg Balance score by > 6 points (42/56)to demonstrate decreased fall risk during functional activities. Baseline: 2/21: 36/56 5/15: 45   Goal status: MET  3.  Patient will increase six minute walk test distance to >1500 for progression to age norm community ambulator and improve gait ability Baseline: 2/21: 1200 ft with [facility] 5/15: 1300 ft with [facility]  8/22: 905 ft with [facility] 10/17: 1100 ft with  [facility] 11/14: 1110 ft with [facility]  Goal status: In progress  4.  Patient will increase BLE gross strength to 4+/5 as to improve functional strength for independent gait, increased standing tolerance and increased ADL ability. Baseline: 2/21 :see above 5/15: see above 10/17: RLE 5/5 LLE 4/5 L ankle 3/5  11/14: RLE 5/5 LLE 4+/5 hamstring 4/5, ankle: 3+/5 Goal status:  In progress   6.  Patient will increase Berg Balance score by > 6 points (51/56) to demonstrate decreased fall risk during functional activities. Baseline:  5/15: 45 8/22: 41/56 10/17: 28 11/14: 36  Goal status: Ongoing  7.   Patient will increase UE strength to >4+/5 for ADLs, AD usage, and quality of life.  Baseline: 5/15: see above 8/22: see above 11/14: >4+/5 bilaterally  Goal status: MET  8.   Patient will increase lower extremity functional scale to >60/80 to demonstrate improved functional mobility and increased tolerance with ADLs.  Baseline: 11/14: 29%  Goal status: Initial   ASSESSMENT:  CLINICAL IMPRESSION: Patient is challenged with donning, doffing coat as well as utilizing buttons. Coordination and ADL task portions added to plan for today's session with ptaient demonstrating improvement with repetition. Patient will benefit from skilled physical therapy to improve strength, mobility, and stability for improved quality of life.   OBJECTIVE IMPAIRMENTS: Abnormal gait, decreased activity tolerance, decreased balance, decreased coordination, decreased endurance, decreased mobility, difficulty walking, decreased ROM, decreased strength, hypomobility, impaired flexibility, improper body mechanics, and postural dysfunction.   ACTIVITY LIMITATIONS: carrying, lifting, bending, standing, squatting, stairs, transfers, reach over head, locomotion level, and caring for others  PARTICIPATION LIMITATIONS: meal prep, cleaning, laundry, personal finances, driving, shopping, community activity, and yard work  PERSONAL FACTORS: Age,  Past/current experiences, Time since onset of injury/illness/exacerbation, and 3+ comorbidities: DHD, arthritis, concussion COVID, HTN, multiple lacunar infarcts, MVP, pernicious anemia, rocky mountain spotted fever, spinal stenosis, vestibular migraine, vitamin B deficiency.   are also affecting patient's functional outcome.   REHAB POTENTIAL: Good  CLINICAL DECISION MAKING: Evolving/moderate complexity  EVALUATION COMPLEXITY: Moderate  PLAN:   PT FREQUENCY: 1x/week  PT DURATION: 12 weeks  PLANNED INTERVENTIONS: Therapeutic exercises, Therapeutic activity, Neuromuscular re-education, Balance training, Gait training, Patient/Family education, Self Care, Joint mobilization, Stair training, Vestibular training, Canalith repositioning, Visual/preceptual remediation/compensation, Orthotic/Fit training, DME instructions, Cognitive remediation, Electrical stimulation, Spinal mobilization, Cryotherapy, Moist heat, Splintting, Taping, Traction, Ultrasound, Manual therapy, and Re-evaluation  PLAN FOR NEXT SESSION:  balance, strength, unstable surfaces    Cullin Dishman  Leopoldo, PT, DPT Physical Therapist - [place] [facility]  Outpatient Physical Therapy- Main Campus [phone]

## 2023-08-22 ENCOUNTER — Ambulatory Visit: Payer: 59 | Attending: Neurosurgery

## 2023-08-22 DIAGNOSIS — F0670 Mild neurocognitive disorder due to known physiological condition without behavioral disturbance: Secondary | ICD-10-CM | POA: Diagnosis not present

## 2023-08-22 DIAGNOSIS — R262 Difficulty in walking, not elsewhere classified: Secondary | ICD-10-CM | POA: Diagnosis not present

## 2023-08-22 DIAGNOSIS — F840 Autistic disorder: Secondary | ICD-10-CM | POA: Diagnosis not present

## 2023-08-22 DIAGNOSIS — R2681 Unsteadiness on feet: Secondary | ICD-10-CM | POA: Insufficient documentation

## 2023-08-22 DIAGNOSIS — M6281 Muscle weakness (generalized): Secondary | ICD-10-CM | POA: Diagnosis not present

## 2023-08-22 DIAGNOSIS — R4681 Obsessive-compulsive behavior: Secondary | ICD-10-CM | POA: Diagnosis not present

## 2023-08-27 DIAGNOSIS — R251 Tremor, unspecified: Secondary | ICD-10-CM | POA: Diagnosis not present

## 2023-08-27 DIAGNOSIS — I10 Essential (primary) hypertension: Secondary | ICD-10-CM | POA: Diagnosis not present

## 2023-08-29 ENCOUNTER — Ambulatory Visit: Payer: 59

## 2023-08-29 DIAGNOSIS — R262 Difficulty in walking, not elsewhere classified: Secondary | ICD-10-CM | POA: Diagnosis not present

## 2023-08-29 DIAGNOSIS — M6281 Muscle weakness (generalized): Secondary | ICD-10-CM

## 2023-08-29 DIAGNOSIS — R2681 Unsteadiness on feet: Secondary | ICD-10-CM

## 2023-08-29 DIAGNOSIS — F840 Autistic disorder: Secondary | ICD-10-CM | POA: Diagnosis not present

## 2023-08-29 DIAGNOSIS — R4681 Obsessive-compulsive behavior: Secondary | ICD-10-CM | POA: Diagnosis not present

## 2023-08-29 DIAGNOSIS — F0670 Mild neurocognitive disorder due to known physiological condition without behavioral disturbance: Secondary | ICD-10-CM | POA: Diagnosis not present

## 2023-08-29 NOTE — Therapy (Signed)
 OUTPATIENT PHYSICAL THERAPY NEURO TREATMENT   Patient Name: IKENNA OHMS MRN: [id number] DOB:04/05/1961, 63 y.o., male Today's Date: 08/29/2023   PCP: Melvin Pao NP REFERRING PROVIDER: Gregory Edsel Ruth PA  END OF SESSION:  PT End of Session - 08/29/23 1408     Visit Number 29    Number of Visits 35    Date for PT Re-Evaluation 09/26/23    Authorization Type eval 2/21    PT Start Time 1406    PT Stop Time 1444    PT Time Calculation (min) 38 min    Equipment Utilized During Treatment Gait belt    Activity Tolerance Patient tolerated treatment well    Behavior During Therapy WFL for tasks assessed/performed                                      Past Medical History:  Diagnosis Date   ADHD    Arthritis    Concussion    COVID 12/2020   Hypertension    Multiple lacunar infarcts (HCC)    MVP (mitral valve prolapse)    Pernicious anemia    [facility] spotted fever    Spinal stenosis    Vestibular migraine    Vitamin B12 deficiency    Vitamin D  deficiency    Past Surgical History:  Procedure Laterality Date   ANTERIOR CERVICAL DECOMP/DISCECTOMY FUSION N/A 07/30/2022   Procedure: C4-7 ANTERIOR CERVICAL DISCECTOMY AND FUSION (GLOBUS HEDRON);  Surgeon: Clois Fret, MD;  Location: [facility] ORS;  Service: Neurosurgery;  Laterality: N/A;   BILATERAL CARPAL TUNNEL RELEASE     CERVICAL WOUND DEBRIDEMENT N/A 08/01/2022   Procedure: CERVICAL WOUND DEBRIDEMENT OF HEMATOMA;  Surgeon: Clois Fret, MD;  Location: [facility] ORS;  Service: Neurosurgery;  Laterality: N/A;   FRACTURE SURGERY Left 1984   intramedullary rod   hip bone transplant  1984   LEG SURGERY Left    oseotomy   REPAIR ANKLE LIGAMENT Right 1978   REPAIR KNEE LIGAMENT Bilateral    acl   TRIGGER FINGER RELEASE Bilateral    thumbs   Patient Active Problem List   Diagnosis Date Noted   Postoperative hematoma of musculoskeletal structure following musculoskeletal  procedure 08/01/2022   Cervical myelopathy (HCC) 07/30/2022   Cervical spinal stenosis 07/30/2022   Radiculopathy, cervical 07/30/2022   S/P cervical spinal fusion 07/30/2022   Vitamin D  deficiency 06/25/2022   B12 deficiency 06/25/2022   Elevated LDL cholesterol level 06/25/2022   Prediabetes 03/27/2021   History of nausea- reported 01/27/2020 02/02/2020   Accidental fall from ladder 02/02/2020   Acute right-sided thoracic back pain 02/02/2020   Neck stiffness 02/02/2020   Concussion with no loss of consciousness 02/02/2020   Multiple falls 02/02/2020   Head trauma, initial encounter 02/02/2020   Acute post-traumatic headache, not intractable 02/02/2020   Essential hypertension 08/26/2018   Pre-bariatric surgery nutrition evaluation 06/19/2018   Migraine with vertigo 01/05/2014   Arthralgia of multiple joints 04/28/2009   Carpal tunnel syndrome 08/30/2008    ONSET DATE: summer 2022  REFERRING DIAG: LE weakness and gait instability   THERAPY DIAG:  Muscle weakness (generalized)  Unsteadiness on feet  Difficulty in walking, not elsewhere classified  Rationale for Evaluation and Treatment: Rehabilitation  SUBJECTIVE:  SUBJECTIVE STATEMENT: Patient reports he is feeling more unstable.   Pt accompanied by: self  PERTINENT HISTORY:  Patient presents for LE weakness and gait instability. He is s/p ACDF C-7 on 07/30/22. Patient has a leg length discrepancy and limited L knee flexion since his 20's. Patient has a lot of atrophy and lower back pain, foot drop bilaterally.  First symptoms was stumbling up stairs, with LLE. Foot drop was improved after ACDF. Now has numbness and tingling at the end of the day. Does have a few lesions in his pons per wife, on blood pressure medication now. PMH includes  ADHD, arthritis, concussion COVID, HTN, multiple lacunar infarcts, MVP, pernicious anemia, rocky mountain spotted fever, spinal stenosis, vestibular migraine, vitamin B deficiency.   PAIN:  Are you having pain?  Denies pain   PRECAUTIONS: Other: cervical; no lifting more than 20 lb   WEIGHT BEARING RESTRICTIONS: No  FALLS: Has patient fallen in last 6 months? Yes. Number of falls multiple a week  LIVING ENVIRONMENT: Lives with: lives with their family Lives in: House/apartment Stairs: Yes: Internal: flight steps; is sleeping downstairs Has following equipment at home: Single point cane  PLOF: Independent, uses a walking stick  PATIENT GOALS: gait control of feet, strengthen legs, balance, atrophy in hand  OBJECTIVE:   DIAGNOSTIC FINDINGS:  09/11/22: No acute fracture. No spondylolisthesis. No compression deformities. Disc space narrowing C3-4 with marginal osteophytes consistent with degenerative disc disease. Postop changes ACDF C4-C7 with anatomic alignment. Normal prevertebral and cervicocranial soft tissues.   06/19/22: 1. L5 chronic pars defects with L5-S1 anterolisthesis and advanced disc degeneration causing marked biforaminal L5 impingement. 2. Degeneration and short pedicles causes compressive spinal stenosis from L1-2 to L3-4, most advanced at L2-3. Moderate bilateral foraminal narrowing at the same levels.   COGNITION: Overall cognitive status: Within functional limits for tasks assessed    COORDINATION: Limited LLE  MUSCLE LENGTH: Significant limitation L hamstring; 60 degree flexion deficit.   POSTURE:  weight shift to the R  LOWER EXTREMITY ROM:     L knee flexion contraction -60 contraction LOWER EXTREMITY MMT:    MMT Right Eval R 5/15 R 8/22 Left Eval L 5/15 L 8/22  Hip flexion 4 5 5 4  4+ 4  Hip extension        Hip abduction 4- 4 4 4- 4 4  Hip adduction 4- 4 4 4- 4- 4  Hip internal rotation        Hip external rotation        Knee flexion  4- 4 4 3  4- 4-  Knee extension 4 4+ 5 4 4  4-  Ankle dorsiflexion 3 3 3+ 2+ 2+ 2+  Ankle plantarflexion 3 3+ 4- 3 3+ 3+  Ankle inversion        Ankle eversion        (Blank rows = not tested)  01/02/23 Right 8/22: RECERT  10/17 PN Left 8/22: Recert LUE 10/17: PN   Shoulder Flexion 4 4+ 5 4- 4+ 4+  Shoulder Abduction 4- 4 4+ 4- 4 4+  Shoulder Extension 4- 4+ 4 4- 4   Shoulder adduction 4- 4  3+ 4 4+  Bicep  4 4+ 5 4 4+ 4  Triceps 4- 4- 5 3+ 4 4      BED MOBILITY:  Perform next session  TRANSFERS: Assistive device utilized: Single point cane  Sit to stand: CGA Stand to sit: CGA Chair to chair: CGA   GAIT: Gait pattern: L knee  limited extension, L foot drop, L foot eversion Distance walked: 1200 Assistive device utilized: Single point cane Level of assistance: CGA Comments: Patient has increased eversion and rolling of L foot, LLE leg length discrepancy.   FUNCTIONAL TESTS:  5 times sit to stand: 5.6 seconds with minor use of LLE no UE support Timed up and go (TUG): n/a 6 minute walk test: 1200 ft 10 meter walk test: 7 seconds BERG: 36/56   PATIENT SURVEYS:  FOTO 54  TODAY'S TREATMENT:                                                                                                                              DATE: 08/29/23 Unless otherwise stated, CGA was provided and gait belt donned in order to ensure pt safety  TherEx Sit to stand 10x with airex pad Sit to stand forward step/back 10x each side   Standing with # 4 ankle weight: CGA for stability  -Hip extension with b upper extremity support, cueing for neutral hip alignment, upright posture for optimal muscle recruitment, and sequencing, 10x each LE,  -Hip abduction with b upper extremity support, cueing for neutral foot alignment for correct muscle activation, 10x each LE -Hip flexion with b upper extremity support, cueing for body mechanics, speed of muscle recruitment for optimal strengthening and  stabilization 10x each LE    Seated with # 4 ankle weights  -Seated marches with upright posture, back away from back of chair for abdominal/trunk activation/stabilization, 10x each LE -Seated LAQ with 3 second holds, 10x each LE, cueing for muscle activation and sequencing for neutral alignment -Seated IR/ER with cueing for stabilizing knee placement with lateral foot movement for optimal muscle recruitment, 10x each LE  Neuro Re-ed  Standing with CGA next to support surface:  Airex pad: static stand 30 seconds x 2 trials, noticeable trembling of ankles/LE's with fatigue and challenge to maintain stability Airex pad: horizontal head turns 30 seconds scanning room 10x ; cueing for arc of motion  Airex pad: vertical head turns 30 seconds, cueing for arc of motion, noticeable sway with upward gaze increasing demand on ankle righting reaction musculature Airex pad: one foot on 6 step one foot on airex pad, hold position for 30 seconds, switch legs, 2x each LE;  Airex pad: dual task reaching and placing letters in boxes. X 9 minutes   Balloon taps x 3 minutes   PATIENT EDUCATION: Education details: goals, POC, educated on safety during updated exercises  Person educated: Patient and Spouse Education method: Explanation, Demonstration, Tactile cues, and Verbal cues Education comprehension: verbalized understanding, returned demonstration, verbal cues required, and tactile cues required  HOME EXERCISE PROGRAM: Access Code: [email or web address] URL: [email or web address] Date: 10/11/2022 Prepared by: Shruthi Northrup  Exercises - Seated Ankle Alphabet  - 1 x daily - 7 x weekly - 2 sets - 10 reps - 5 hold - Seated Heel Toe Raises  - 1 x daily - 7 x weekly -  2 sets - 10 reps - 5 hold - Standing March with Counter Support  - 1 x daily - 7 x weekly - 2 sets - 10 reps - 5 hold  Access Code: [email or web address] URL: [email or web address] Date: 10/30/2022 Prepared by: Josuel Koeppen  Exercises - Seated Ankle Plantarflexion with Resistance  - 1 x daily - 7 x weekly - 2 sets - 10 reps - 5 hold - Seated Ankle Eversion with Resistance  - 1 x daily - 7 x weekly - 2 sets - 10 reps - 5 hold  Access Code: [email or web address] URL: [email or web address] Date: 11/14/2022 Prepared by: Rawlin Reaume  Exercises - Seated Heel Toe Raises  - 1 x daily - 7 x weekly - 2 sets - 10 reps - 5 hold - Seated Hip Internal Rotation AROM  - 1 x daily - 7 x weekly - 2 sets - 10 reps - 5 hold - Bilateral Long Arc Quad  - 1 x daily - 7 x weekly - 2 sets - 10 reps - 5 hold - Foam Balance Tandem stance  - 1 x daily - 7 x weekly - 2 sets - 2 reps - 30 hold - Single Leg Balance on Foam  - 1 x daily - 7 x weekly - 2 sets - 30 reps - 2 hold - Seated Hip Flexion March with Ankle Weights  - 1 x daily - 7 x weekly - 2 sets - 10 reps - 5 hold - Standing Hip Abduction with Ankle Weight  - 1 x daily - 7 x weekly - 2 sets - 10 reps - 5 hold - Standing Hip Extension with Ankle Weight  - 1 x daily - 7 x weekly - 2 sets - 10 reps - 5 hold - Standing Hip Flexion with Ankle Weight  - 1 x daily - 7 x weekly - 2 sets - 10 reps - 5 hold - Seated Long Arc Quad with Ankle Weight  - 1 x daily - 7 x weekly - 2 sets - 10 reps - 5 hold Access Code: [email or web address] URL: [email or web address] Date: 08/22/2023 Prepared by: Vinita Prentiss  Exercises - Seated Finger Tip Pinch with Putty  - 1 x daily - 7 x weekly - 2 sets - 10 reps - 5 hold - Hand Squeezes  - 1 x daily - 7 x weekly - 2 sets - 10 reps - 5 hold - Key Pinch with Putty  - 1 x daily - 7 x weekly - 2 sets - 10 reps - 5 hold  GOALS: Goals reviewed with patient? Yes  SHORT TERM GOALS: Target date: 05/09/2023   Patient will be independent in home exercise program to improve strength/mobility for better functional independence with ADLs. Baseline: 2/21: HEP give next session 5/15 : intermittent compliance 8/22: intermittent compliance due to illness  11/14: compliant  Goal status: MET    LONG TERM GOALS: Target date: 09/26/2023  Patient will increase FOTO score to equal to or greater than  64%   to demonstrate statistically significant improvement in mobility and quality of life.  Baseline: 2/21: 54% 8/22: 52% 10/17: 57% 11/14: 53% Goal status: On going   2.  Patient will increase Berg Balance score by > 6 points (42/56)to demonstrate decreased fall risk during functional activities. Baseline: 2/21: 36/56 5/15: 45   Goal status: MET  3.  Patient will increase six minute walk test distance to >1500 for progression to age norm community ambulator and improve gait ability Baseline: 2/21: 1200  ft with [facility] 5/15: 1300 ft with [facility]  8/22: 905 ft with [facility] 10/17: 1100 ft with [facility] 11/14: 1110 ft with [facility]  Goal status: In progress  4.  Patient will increase BLE gross strength to 4+/5 as to improve functional strength for independent gait, increased standing tolerance and increased ADL ability. Baseline: 2/21 :see above 5/15: see above 10/17: RLE 5/5 LLE 4/5 L ankle 3/5  11/14: RLE 5/5 LLE 4+/5 hamstring 4/5, ankle: 3+/5 Goal status:  In progress   6.  Patient will increase Berg Balance score by > 6 points (51/56) to demonstrate decreased fall risk during functional activities. Baseline:  5/15: 45 8/22: 41/56 10/17: 28 11/14: 36  Goal status: Ongoing  7.   Patient will increase UE strength to >4+/5 for ADLs, AD usage, and quality of life.  Baseline: 5/15: see above 8/22: see above 11/14: >4+/5 bilaterally  Goal status: MET  8.   Patient will increase lower extremity functional scale to >60/80 to demonstrate improved functional mobility and increased tolerance with ADLs.  Baseline: 11/14: 29%  Goal status: Initial   ASSESSMENT:  CLINICAL IMPRESSION: Patient tolerates progressive strengthening and stabilization techniques. Patient requests stretching for low back in future sessions, will add to plan. Patient is challenged with LLE  coordination.  Patient will benefit from skilled physical therapy to improve strength, mobility, and stability for improved quality of life.   OBJECTIVE IMPAIRMENTS: Abnormal gait, decreased activity tolerance, decreased balance, decreased coordination, decreased endurance, decreased mobility, difficulty walking, decreased ROM, decreased strength, hypomobility, impaired flexibility, improper body mechanics, and postural dysfunction.   ACTIVITY LIMITATIONS: carrying, lifting, bending, standing, squatting, stairs, transfers, reach over head, locomotion level, and caring for others  PARTICIPATION LIMITATIONS: meal prep, cleaning, laundry, personal finances, driving, shopping, community activity, and yard work  PERSONAL FACTORS: Age, Past/current experiences, Time since onset of injury/illness/exacerbation, and 3+ comorbidities: DHD, arthritis, concussion COVID, HTN, multiple lacunar infarcts, MVP, pernicious anemia, rocky mountain spotted fever, spinal stenosis, vestibular migraine, vitamin B deficiency.   are also affecting patient's functional outcome.   REHAB POTENTIAL: Good  CLINICAL DECISION MAKING: Evolving/moderate complexity  EVALUATION COMPLEXITY: Moderate  PLAN:   PT FREQUENCY: 1x/week  PT DURATION: 12 weeks  PLANNED INTERVENTIONS: Therapeutic exercises, Therapeutic activity, Neuromuscular re-education, Balance training, Gait training, Patient/Family education, Self Care, Joint mobilization, Stair training, Vestibular training, Canalith repositioning, Visual/preceptual remediation/compensation, Orthotic/Fit training, DME instructions, Cognitive remediation, Electrical stimulation, Spinal mobilization, Cryotherapy, Moist heat, Splintting, Taping, Traction, Ultrasound, Manual therapy, and Re-evaluation  PLAN FOR NEXT SESSION:  balance, strength, unstable surfaces    Rahima Fleishman  Leopoldo, PT, DPT Physical Therapist - [place] [facility]  Outpatient Physical  Therapy- Main Campus [phone]

## 2023-09-03 DIAGNOSIS — I10 Essential (primary) hypertension: Secondary | ICD-10-CM | POA: Diagnosis not present

## 2023-09-03 DIAGNOSIS — R944 Abnormal results of kidney function studies: Secondary | ICD-10-CM | POA: Diagnosis not present

## 2023-09-04 NOTE — Therapy (Signed)
OUTPATIENT PHYSICAL THERAPY NEURO TREATMENT/ Physical Therapy Progress Note   Dates of reporting period  06/06/23   to   09/05/23     Patient Name: [name] MRN: [id number] DOB:08/16/1961, 63 y.o., male Today's Date: 09/05/2023   PCP: Larae Grooms NP REFERRING PROVIDER: Susanne Borders PA  END OF SESSION:  PT End of Session - 09/05/23 1354     Visit Number 30    Number of Visits 35    Date for PT Re-Evaluation 09/26/23    Authorization Type eval 2/21    PT Start Time 1358    PT Stop Time 1444    PT Time Calculation (min) 46 min    Equipment Utilized During Treatment Gait belt    Activity Tolerance Patient tolerated treatment well    Behavior During Therapy [facility] for tasks assessed/performed                                       Past Medical History:  Diagnosis Date   ADHD    Arthritis    Concussion    COVID 12/2020   Hypertension    Multiple lacunar infarcts (HCC)    MVP (mitral valve prolapse)    Pernicious anemia    [facility] spotted fever    Spinal stenosis    Vestibular migraine    Vitamin B12 deficiency    Vitamin D deficiency    Past Surgical History:  Procedure Laterality Date   ANTERIOR CERVICAL DECOMP/DISCECTOMY FUSION N/A 07/30/2022   Procedure: C4-7 ANTERIOR CERVICAL DISCECTOMY AND FUSION (GLOBUS HEDRON);  Surgeon: Venetia Night, MD;  Location: [facility] ORS;  Service: Neurosurgery;  Laterality: N/A;   BILATERAL CARPAL TUNNEL RELEASE     CERVICAL WOUND DEBRIDEMENT N/A 08/01/2022   Procedure: CERVICAL WOUND DEBRIDEMENT OF HEMATOMA;  Surgeon: Venetia Night, MD;  Location: [facility] ORS;  Service: Neurosurgery;  Laterality: N/A;   FRACTURE SURGERY Left 1984   intramedullary rod   hip bone transplant  1984   LEG SURGERY Left    oseotomy   REPAIR ANKLE LIGAMENT Right 1978   REPAIR KNEE LIGAMENT Bilateral    acl   TRIGGER FINGER RELEASE Bilateral    thumbs   Patient Active Problem List    Diagnosis Date Noted   Postoperative hematoma of musculoskeletal structure following musculoskeletal procedure 08/01/2022   Cervical myelopathy (HCC) 07/30/2022   Cervical spinal stenosis 07/30/2022   Radiculopathy, cervical 07/30/2022   S/P cervical spinal fusion 07/30/2022   Vitamin D deficiency 06/25/2022   B12 deficiency 06/25/2022   Elevated LDL cholesterol level 06/25/2022   Prediabetes 03/27/2021   History of nausea- reported 01/27/2020 02/02/2020   Accidental fall from ladder 02/02/2020   Acute right-sided thoracic back pain 02/02/2020   Neck stiffness 02/02/2020   Concussion with no loss of consciousness 02/02/2020   Multiple falls 02/02/2020   Head trauma, initial encounter 02/02/2020   Acute post-traumatic headache, not intractable 02/02/2020   Essential hypertension 08/26/2018   Pre-bariatric surgery nutrition evaluation 06/19/2018   Migraine with vertigo 01/05/2014   Arthralgia of multiple joints 04/28/2009   Carpal tunnel syndrome 08/30/2008    ONSET DATE: summer 2022  REFERRING DIAG: LE weakness and gait instability   THERAPY DIAG:  Muscle weakness (generalized)  Unsteadiness on feet  Difficulty in walking, not elsewhere classified  Rationale for Evaluation and Treatment: Rehabilitation  SUBJECTIVE:  SUBJECTIVE STATEMENT: Patient will be leaving for a month to spend time with family.   Pt accompanied by: self  PERTINENT HISTORY:  Patient presents for LE weakness and gait instability. He is s/p ACDF C-7 on 07/30/22. Patient has a leg length discrepancy and limited L knee flexion since his 20's. Patient has a lot of atrophy and lower back pain, foot drop bilaterally.  First symptoms was stumbling up stairs, with LLE. Foot drop was improved after ACDF. Now has numbness and  tingling at the end of the day. Does have a few lesions in his pons per wife, on blood pressure medication now. PMH includes ADHD, arthritis, concussion COVID, HTN, multiple lacunar infarcts, MVP, pernicious anemia, rocky mountain spotted fever, spinal stenosis, vestibular migraine, vitamin B deficiency.   PAIN:  Are you having pain?  Denies pain   PRECAUTIONS: Other: cervical; no lifting more than 20 lb   WEIGHT BEARING RESTRICTIONS: No  FALLS: Has patient fallen in last 6 months? Yes. Number of falls multiple a week  LIVING ENVIRONMENT: Lives with: lives with their family Lives in: House/apartment Stairs: Yes: Internal: flight steps; is sleeping downstairs Has following equipment at home: Single point cane  PLOF: Independent, uses a walking stick  PATIENT GOALS: gait control of feet, strengthen legs, balance, atrophy in hand  OBJECTIVE:   DIAGNOSTIC FINDINGS:  09/11/22: No acute fracture. No spondylolisthesis. No compression deformities. Disc space narrowing C3-4 with marginal osteophytes consistent with degenerative disc disease. Postop changes ACDF C4-C7 with anatomic alignment. Normal prevertebral and cervicocranial soft tissues.   06/19/22: 1. L5 chronic pars defects with L5-S1 anterolisthesis and advanced disc degeneration causing marked biforaminal L5 impingement. 2. Degeneration and short pedicles causes compressive spinal stenosis from L1-2 to L3-4, most advanced at L2-3. Moderate bilateral foraminal narrowing at the same levels.   COGNITION: Overall cognitive status: Within functional limits for tasks assessed    COORDINATION: Limited LLE  MUSCLE LENGTH: Significant limitation L hamstring; 60 degree flexion deficit.   POSTURE:  weight shift to the R  LOWER EXTREMITY ROM:     L knee flexion contraction -60 contraction LOWER EXTREMITY MMT:    MMT Right Eval R 5/15 R 8/22 Left Eval L 5/15 L 8/22  Hip flexion 4 5 5 4  4+ 4  Hip extension        Hip  abduction 4- 4 4 4- 4 4  Hip adduction 4- 4 4 4- 4- 4  Hip internal rotation        Hip external rotation        Knee flexion 4- 4 4 3  4- 4-  Knee extension 4 4+ 5 4 4  4-  Ankle dorsiflexion 3 3 3+ 2+ 2+ 2+  Ankle plantarflexion 3 3+ 4- 3 3+ 3+  Ankle inversion        Ankle eversion        (Blank rows = not tested)  01/02/23 Right 8/22: RECERT  10/17 PN Left 8/22: Recert LUE 10/17: PN   Shoulder Flexion 4 4+ 5 4- 4+ 4+  Shoulder Abduction 4- 4 4+ 4- 4 4+  Shoulder Extension 4- 4+ 4 4- 4   Shoulder adduction 4- 4  3+ 4 4+  Bicep  4 4+ 5 4 4+ 4  Triceps 4- 4- 5 3+ 4 4      BED MOBILITY:  Perform next session  TRANSFERS: Assistive device utilized: Single point cane  Sit to stand: CGA Stand to sit: CGA Chair to chair: CGA  GAIT: Gait pattern: L knee limited extension, L foot drop, L foot eversion Distance walked: 1200 Assistive device utilized: Single point cane Level of assistance: CGA Comments: Patient has increased eversion and rolling of L foot, LLE leg length discrepancy.   FUNCTIONAL TESTS:  5 times sit to stand: 5.6 seconds with minor use of LLE no UE support Timed up and go (TUG): n/a 6 minute walk test: 1200 ft 10 meter walk test: 7 seconds BERG: 36/56   PATIENT SURVEYS:  FOTO 54  TODAY'S TREATMENT:                                                                                                                              DATE: 09/05/23 Unless otherwise stated, CGA was provided and gait belt donned in order to ensure pt safety   Physical therapy treatment session today consisted of completing assessment of goals and administration of testing as demonstrated and documented in flow sheet, treatment, and goals section of this note. Addition treatments may be found below.    TherEx  Seated with # 4 ankle weights ; x 2 sets each  -Seated marches with upright posture, back away from back of chair for abdominal/trunk activation/stabilization, 10x each  LE -Seated LAQ with 3 second holds, 10x each LE, cueing for muscle activation and sequencing for neutral alignment -Seated IR/ER with cueing for stabilizing knee placement with lateral foot movement for optimal muscle recruitment, 10x each LE -heel raise 10x  RTB abduction 10x  RTB hamstring curl 10x     PATIENT EDUCATION: Education details: goals, POC, educated on safety during updated exercises  Person educated: Patient and Spouse Education method: Explanation, Demonstration, Tactile cues, and Verbal cues Education comprehension: verbalized understanding, returned demonstration, verbal cues required, and tactile cues required  HOME EXERCISE PROGRAM: Access Code: [email or web address] URL: [email or web address] Date: 10/11/2022 Prepared by: Precious Bard  Exercises - Seated Ankle Alphabet  - 1 x daily - 7 x weekly - 2 sets - 10 reps - 5 hold - Seated Heel Toe Raises  - 1 x daily - 7 x weekly - 2 sets - 10 reps - 5 hold - Standing March with Counter Support  - 1 x daily - 7 x weekly - 2 sets - 10 reps - 5 hold  Access Code: [email or web address] URL: [email or web address] Date: 10/30/2022 Prepared by: Precious Bard  Exercises - Seated Ankle Plantarflexion with Resistance  - 1 x daily - 7 x weekly - 2 sets - 10 reps - 5 hold - Seated Ankle Eversion with Resistance  - 1 x daily - 7 x weekly - 2 sets - 10 reps - 5 hold  Access Code: [email or web address] URL: [email or web address] Date: 11/14/2022 Prepared by: Precious Bard  Exercises - Seated Heel Toe Raises  - 1 x daily - 7 x weekly - 2 sets - 10 reps - 5 hold - Seated Hip Internal Rotation AROM  - 1 x daily - 7 x weekly - 2  sets - 10 reps - 5 hold - Bilateral Long Arc Quad  - 1 x daily - 7 x weekly - 2 sets - 10 reps - 5 hold - Foam Balance Tandem stance  - 1 x daily - 7 x weekly - 2 sets - 2 reps - 30 hold - Single Leg Balance on Foam  - 1 x daily - 7 x weekly - 2 sets - 30 reps - 2 hold - Seated Hip Flexion March  with Ankle Weights  - 1 x daily - 7 x weekly - 2 sets - 10 reps - 5 hold - Standing Hip Abduction with Ankle Weight  - 1 x daily - 7 x weekly - 2 sets - 10 reps - 5 hold - Standing Hip Extension with Ankle Weight  - 1 x daily - 7 x weekly - 2 sets - 10 reps - 5 hold - Standing Hip Flexion with Ankle Weight  - 1 x daily - 7 x weekly - 2 sets - 10 reps - 5 hold - Seated Long Arc Quad with Ankle Weight  - 1 x daily - 7 x weekly - 2 sets - 10 reps - 5 hold Access Code: [email or web address] URL: [email or web address] Date: 08/22/2023 Prepared by: Precious Bard  Exercises - Seated Finger Tip Pinch with Putty  - 1 x daily - 7 x weekly - 2 sets - 10 reps - 5 hold - Hand Squeezes  - 1 x daily - 7 x weekly - 2 sets - 10 reps - 5 hold - Key Pinch with Putty  - 1 x daily - 7 x weekly - 2 sets - 10 reps - 5 hold  GOALS: Goals reviewed with patient? Yes  SHORT TERM GOALS: Target date: 05/09/2023   Patient will be independent in home exercise program to improve strength/mobility for better functional independence with ADLs. Baseline: 2/21: HEP give next session 5/15 : intermittent compliance 8/22: intermittent compliance due to illness 11/14: compliant  Goal status: MET    LONG TERM GOALS: Target date: 09/26/2023  Patient will increase FOTO score to equal to or greater than  64%   to demonstrate statistically significant improvement in mobility and quality of life.  Baseline: 2/21: 54% 8/22: 52% 10/17: 57% 11/14: 53% 1/16: 47% Goal status: On going   2.  Patient will increase Berg Balance score by > 6 points (42/56)to demonstrate decreased fall risk during functional activities. Baseline: 2/21: 36/56 5/15: 45   Goal status: MET  3.  Patient will increase six minute walk test distance to >1500 for progression to age norm community ambulator and improve gait ability Baseline: 2/21: 1200 ft with [facility] 5/15: 1300 ft with [facility]  8/22: 905 ft with [facility] 10/17: 1100 ft with [facility] 11/14: 1110 ft with [facility] 1/16:  1210 ft with [facility] Goal status: In progress  4.  Patient will increase BLE gross strength to 4+/5 as to improve functional strength for independent gait, increased standing tolerance and increased ADL ability. Baseline: 2/21 :see above 5/15: see above 10/17: RLE 5/5 LLE 4/5 L ankle 3/5  11/14: RLE 5/5 LLE 4+/5 hamstring 4/5, ankle: 3+/5 1/16: RLE 5/5 LLE 4+/5 ankle 4-/5 Goal status:  In progress   6.  Patient will increase Berg Balance score by > 6 points (51/56) to demonstrate decreased fall risk during functional activities. Baseline:  5/15: 45 8/22: 41/56 10/17: 28 11/14: 36 1/16: 36 Goal status: Ongoing  7.   Patient will increase UE strength to >4+/5 for  ADLs, AD usage, and quality of life.  Baseline: 5/15: see above 8/22: see above 11/14: >4+/5 bilaterally  Goal status: MET  8.   Patient will increase lower extremity functional scale to >60/80 to demonstrate improved functional mobility and increased tolerance with ADLs.  Baseline: 11/14: 29%  1/16: 29%  Goal status: ongoing   ASSESSMENT:  CLINICAL IMPRESSION:  Patient's condition has the potential to improve in response to therapy. Maximum improvement is yet to be obtained. The anticipated improvement is attainable and reasonable in a generally predictable time. Patient agreeable to be seen until he leaves for [place] ;will assess d/c as time gets closer.    Patient will benefit from skilled physical therapy to improve strength, mobility, and stability for improved quality of life.   OBJECTIVE IMPAIRMENTS: Abnormal gait, decreased activity tolerance, decreased balance, decreased coordination, decreased endurance, decreased mobility, difficulty walking, decreased ROM, decreased strength, hypomobility, impaired flexibility, improper body mechanics, and postural dysfunction.   ACTIVITY LIMITATIONS: carrying, lifting, bending, standing, squatting, stairs, transfers, reach over head, locomotion level, and caring for  others  PARTICIPATION LIMITATIONS: meal prep, cleaning, laundry, personal finances, driving, shopping, community activity, and yard work  PERSONAL FACTORS: Age, Past/current experiences, Time since onset of injury/illness/exacerbation, and 3+ comorbidities: DHD, arthritis, concussion COVID, HTN, multiple lacunar infarcts, MVP, pernicious anemia, rocky mountain spotted fever, spinal stenosis, vestibular migraine, vitamin B deficiency.   are also affecting patient's functional outcome.   REHAB POTENTIAL: Good  CLINICAL DECISION MAKING: Evolving/moderate complexity  EVALUATION COMPLEXITY: Moderate  PLAN:   PT FREQUENCY: 1x/week  PT DURATION: 12 weeks  PLANNED INTERVENTIONS: Therapeutic exercises, Therapeutic activity, Neuromuscular re-education, Balance training, Gait training, Patient/Family education, Self Care, Joint mobilization, Stair training, Vestibular training, Canalith repositioning, Visual/preceptual remediation/compensation, Orthotic/Fit training, DME instructions, Cognitive remediation, Electrical stimulation, Spinal mobilization, Cryotherapy, Moist heat, Splintting, Taping, Traction, Ultrasound, Manual therapy, and Re-evaluation  PLAN FOR NEXT SESSION:  balance, strength, unstable surfaces    Precious Bard, PT, DPT Physical Therapist - [facility]  Outpatient Physical Therapy- [facility] [phone]

## 2023-09-05 ENCOUNTER — Ambulatory Visit: Payer: 59

## 2023-09-05 DIAGNOSIS — R262 Difficulty in walking, not elsewhere classified: Secondary | ICD-10-CM | POA: Diagnosis not present

## 2023-09-05 DIAGNOSIS — M6281 Muscle weakness (generalized): Secondary | ICD-10-CM | POA: Diagnosis not present

## 2023-09-05 DIAGNOSIS — F840 Autistic disorder: Secondary | ICD-10-CM | POA: Diagnosis not present

## 2023-09-05 DIAGNOSIS — R2681 Unsteadiness on feet: Secondary | ICD-10-CM | POA: Diagnosis not present

## 2023-09-05 DIAGNOSIS — R4681 Obsessive-compulsive behavior: Secondary | ICD-10-CM | POA: Diagnosis not present

## 2023-09-05 DIAGNOSIS — F0670 Mild neurocognitive disorder due to known physiological condition without behavioral disturbance: Secondary | ICD-10-CM | POA: Diagnosis not present

## 2023-09-12 ENCOUNTER — Ambulatory Visit: Payer: 59

## 2023-09-12 ENCOUNTER — Ambulatory Visit: Payer: 59 | Admitting: Nurse Practitioner

## 2023-09-12 DIAGNOSIS — F0670 Mild neurocognitive disorder due to known physiological condition without behavioral disturbance: Secondary | ICD-10-CM | POA: Diagnosis not present

## 2023-09-12 DIAGNOSIS — F840 Autistic disorder: Secondary | ICD-10-CM | POA: Diagnosis not present

## 2023-09-12 DIAGNOSIS — R4681 Obsessive-compulsive behavior: Secondary | ICD-10-CM | POA: Diagnosis not present

## 2023-09-16 ENCOUNTER — Encounter: Payer: Self-pay | Admitting: Nurse Practitioner

## 2023-09-16 DIAGNOSIS — R1012 Left upper quadrant pain: Secondary | ICD-10-CM | POA: Diagnosis not present

## 2023-09-16 DIAGNOSIS — D510 Vitamin B12 deficiency anemia due to intrinsic factor deficiency: Secondary | ICD-10-CM | POA: Diagnosis not present

## 2023-09-16 DIAGNOSIS — K219 Gastro-esophageal reflux disease without esophagitis: Secondary | ICD-10-CM | POA: Diagnosis not present

## 2023-09-16 DIAGNOSIS — I10 Essential (primary) hypertension: Secondary | ICD-10-CM

## 2023-09-16 DIAGNOSIS — R634 Abnormal weight loss: Secondary | ICD-10-CM | POA: Diagnosis not present

## 2023-09-19 ENCOUNTER — Ambulatory Visit: Payer: 59

## 2023-09-26 ENCOUNTER — Ambulatory Visit: Payer: 59

## 2023-10-03 ENCOUNTER — Ambulatory Visit: Payer: 59

## 2023-10-07 ENCOUNTER — Ambulatory Visit: Payer: 59 | Admitting: Nurse Practitioner

## 2023-10-10 ENCOUNTER — Ambulatory Visit: Payer: 59

## 2023-10-17 ENCOUNTER — Ambulatory Visit: Payer: 59

## 2023-10-21 IMAGING — MR MR HEAD WO/W CM
13 series · 48 of 48 positions shown · IV contrast (gadavist)
Comparison: Head CT 02/03/2020.  Brain MRI 11/18/2013.

CLINICAL DATA: Provided history: Migraine with vertigo. Additional
history provided by scanning technologist: Patient reports dizziness
for 5 years, history of TBI and right eye blindness, uncontrolled
hypertension.

EXAM:
MRI HEAD WITHOUT AND WITH CONTRAST
TECHNIQUE: Multiplanar, multiecho pulse sequences of the brain and surrounding
structures were obtained without and with intravenous contrast.
CONTRAST:  7.5mL GADAVIST GADOBUTROL 1 MMOL/ML IV SOLN

[Series 5: ax dwi_tracew · axial · 3.0mm · 0.65mm/px · z∈[-20,+133]mm · 3 of 48 slices shown]
[im 1/48]
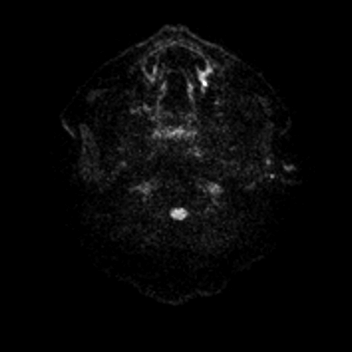
[im 24/48]
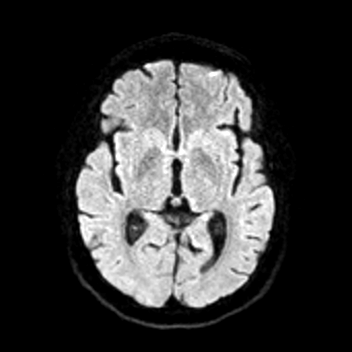
[im 48/48]
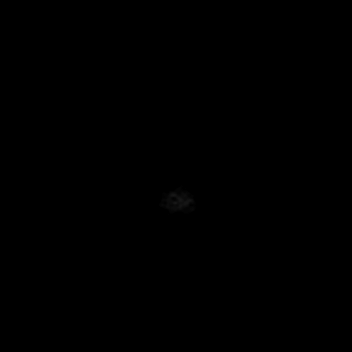

[Series 6: ax dwi_adc · axial · 3.0mm · 0.65mm/px · z∈[-20,+126]mm · 3 of 46 slices shown]
[im 1/46]
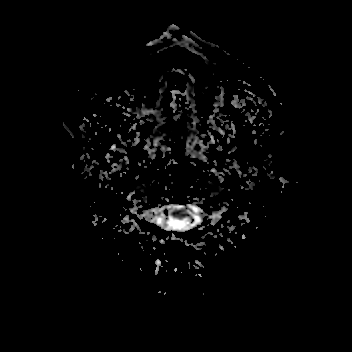
[im 23/46]
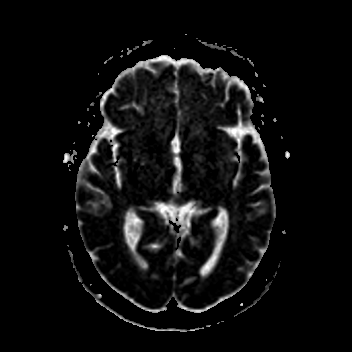
[im 46/46]
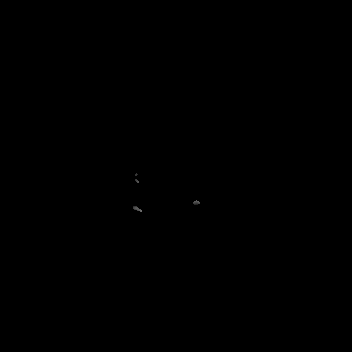

[Series 7: cor dwi_tracew · coronal · 5.0mm · 0.68mm/px · 2 of 40 slices shown]
[im 1/40]
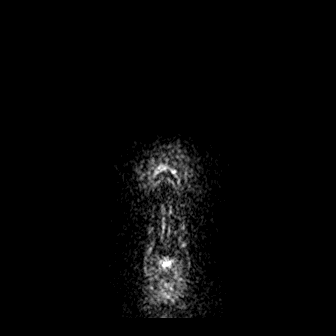
[im 40/40]
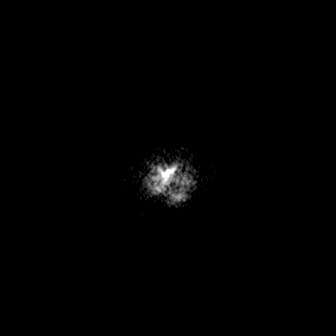

[Series 8: cor dwi_adc · coronal · 5.0mm · 0.68mm/px · 2 of 40 slices shown]
[im 1/40]
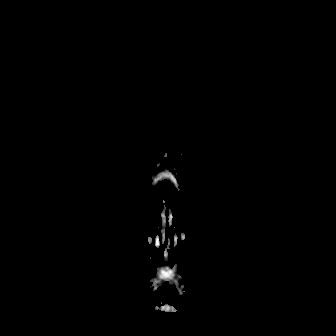
[im 40/40]
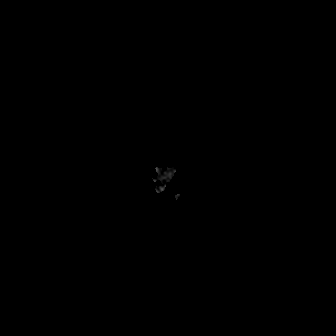

[Series 9: T1 · sagittal · 5.0mm · 0.62mm/px · 1 of 21 slices shown (1 of 2)]
[im 1/21]
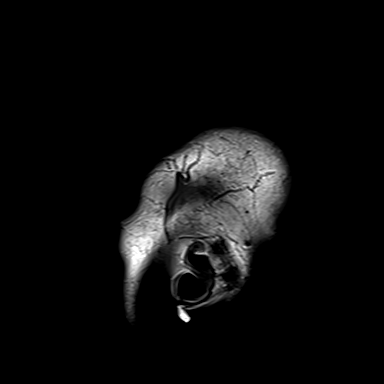

[Series 10: T2 · axial · 5.0mm · 0.53mm/px · z∈[-11,+144]mm · 2 of 27 slices shown]
[im 1/27]
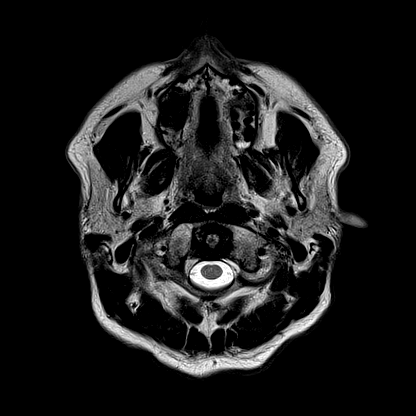
[im 27/27]
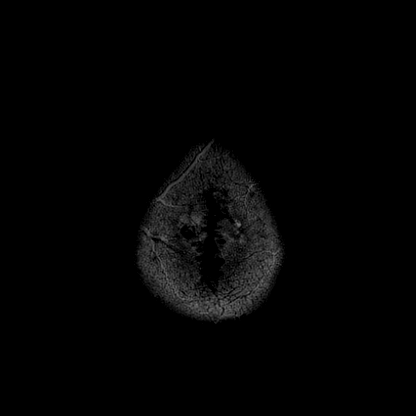

[Series 12: pha_images · axial · 3.0mm · 0.90mm/px · z∈[-16,+148]mm · 3 of 56 slices shown]
[im 1/56]
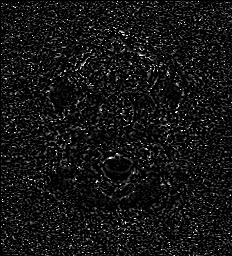
[im 28/56]
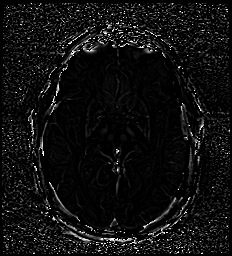
[im 56/56]
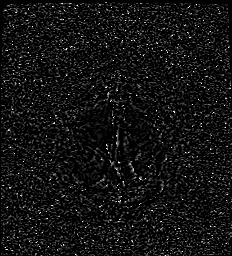

[Series 13: swi_images · axial · 3.0mm · 0.90mm/px · z∈[-16,+148]mm · 3 of 56 slices shown]
[im 1/56]
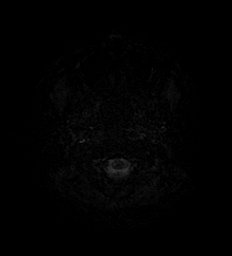
[im 28/56]
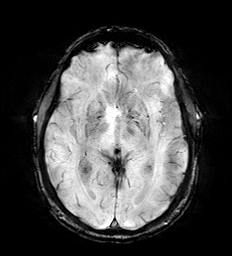
[im 56/56]
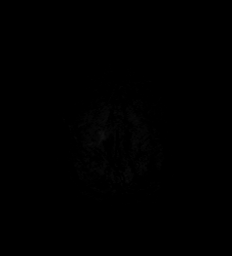

[Series 15: FLAIR · axial · 3.0mm · 0.53mm/px · z∈[-14,+147]mm · 3 of 55 slices shown]
[im 1/55]
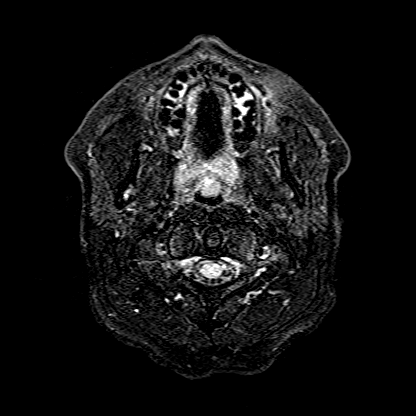
[im 28/55]
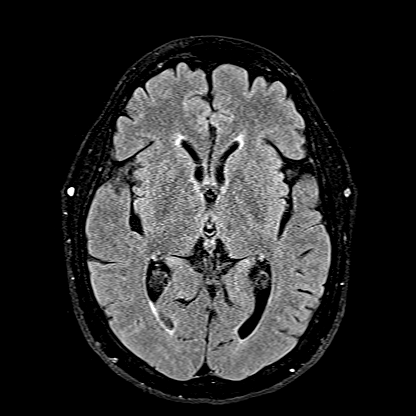
[im 55/55]
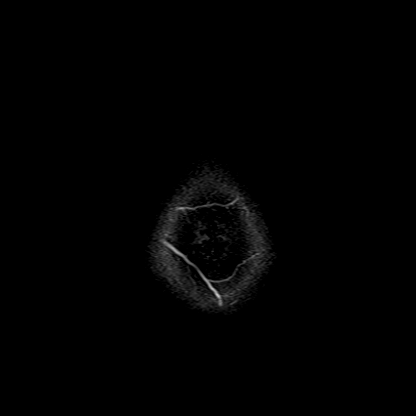

[Series 16: T1 · axial · 1.0mm · 0.98mm/px · z∈[-22,+152]mm · 11 of 176 slices shown (2 of 2)]
[im 1/176]
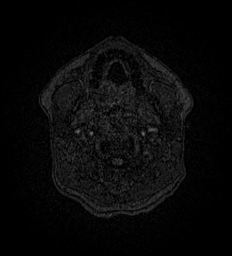
[im 18/176]
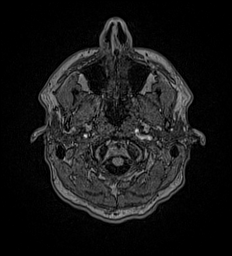
[im 36/176]
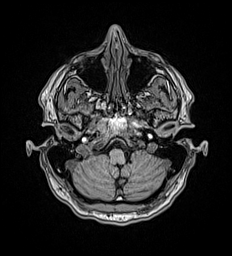
[im 53/176]
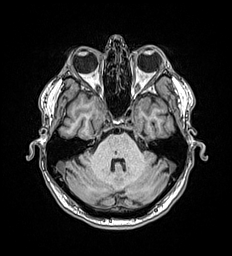
[im 71/176]
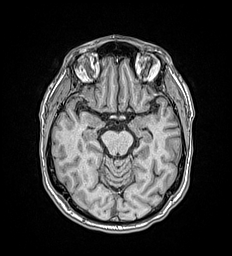
[im 88/176]
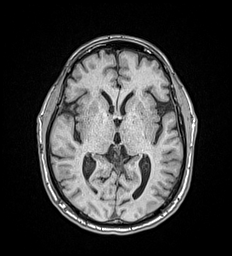
[im 106/176]
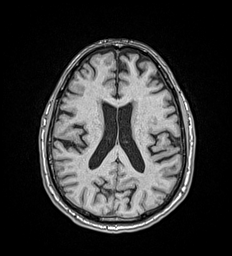
[im 123/176]
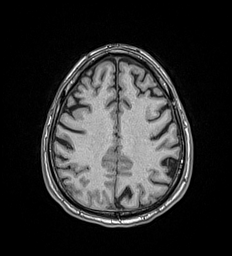
[im 141/176]
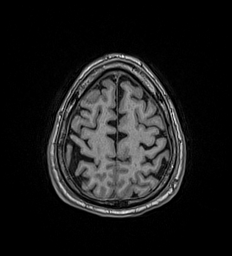
[im 158/176]
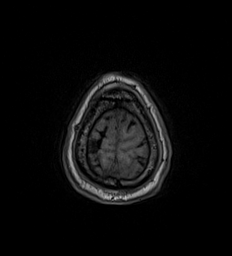
[im 176/176]
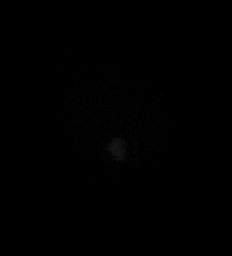

[Series 17: T2 post-contrast · coronal · 5.0mm · 0.57mm/px · 2 of 29 slices shown]
[im 1/29]
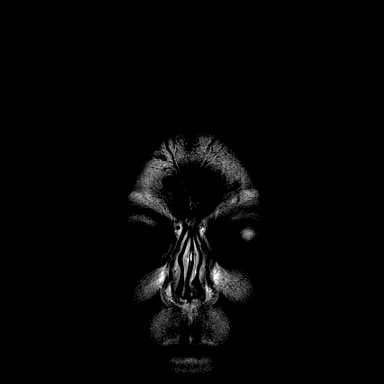
[im 29/29]
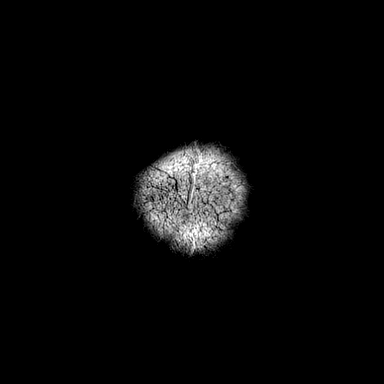

[Series 18: T1 post-contrast · axial · 1.0mm · 0.98mm/px · z∈[-22,+152]mm · 11 of 176 slices shown (1 of 2)]
[im 1/176]
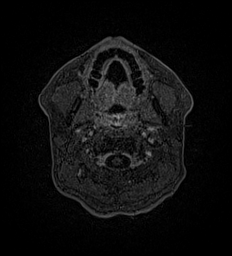
[im 18/176]
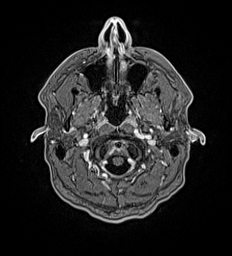
[im 36/176]
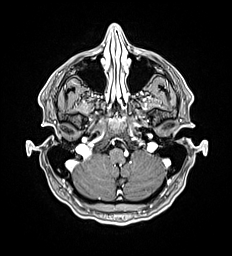
[im 53/176]
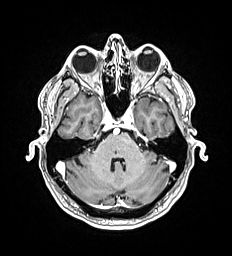
[im 71/176]
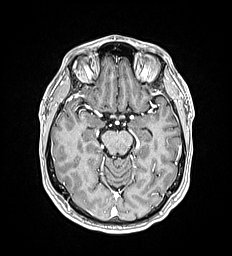
[im 88/176]
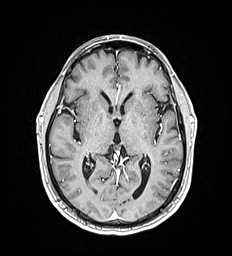
[im 106/176]
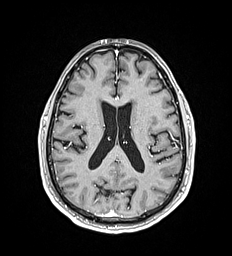
[im 123/176]
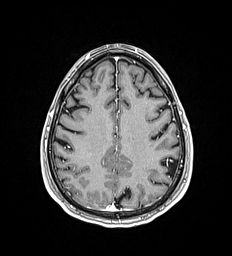
[im 141/176]
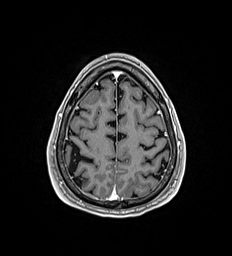
[im 158/176]
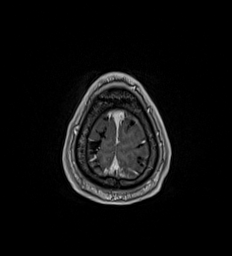
[im 176/176]
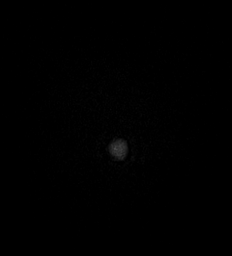

[Series 19: T1 post-contrast · coronal · 5.0mm · 0.57mm/px · 2 of 29 slices shown (2 of 2)]
[im 1/29]
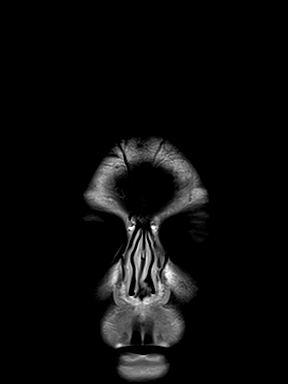
[im 29/29]
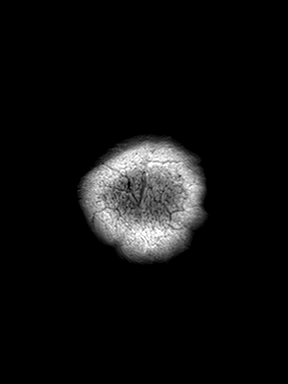

[48 of 48 positions shown; findings below may reference images not displayed]

FINDINGS: Brain:

Mild generalized cerebral atrophy.

No cortical encephalomalacia is identified. No significant cerebral
white matter disease for age.

Redemonstrated prominent perivascular spaces within the pons and
medial right cerebellum.

There is no acute infarct.

No evidence of an intracranial mass.

No extra-axial fluid collection.

No midline shift.

No pathologic intracranial enhancement identified.

Vascular: Maintained flow voids within the proximal large arterial
vessels.

Skull and upper cervical spine: No focal suspicious marrow lesion.

Sinuses/Orbits: Visualized orbits show no acute finding. No
significant paranasal sinus disease.
IMPRESSION: No evidence of acute intracranial abnormality.

Mild generalized cerebral atrophy.

Otherwise unremarkable MRI appearance of the brain for age.

## 2023-10-23 NOTE — Therapy (Signed)
 OUTPATIENT PHYSICAL THERAPY NEURO TREATMENT/ RECERT     Patient Name: [name] MRN: [id number] DOB:1961-08-15, 63 y.o., male Today's Date: 10/24/2023   PCP: Larae Grooms NP REFERRING PROVIDER: Susanne Borders PA  END OF SESSION:  PT End of Session - 10/24/23 1406     Visit Number 31    Number of Visits 43    Date for PT Re-Evaluation 01/16/24    Authorization Type eval 2/21    PT Start Time 1405    PT Stop Time 1444    PT Time Calculation (min) 39 min    Equipment Utilized During Treatment Gait belt    Activity Tolerance Patient tolerated treatment well    Behavior During Therapy WFL for tasks assessed/performed                                        Past Medical History:  Diagnosis Date   ADHD    Arthritis    Concussion    COVID 12/2020   Hypertension    Multiple lacunar infarcts (HCC)    MVP (mitral valve prolapse)    Pernicious anemia    [facility] spotted fever    Spinal stenosis    Vestibular migraine    Vitamin B12 deficiency    Vitamin D deficiency    Past Surgical History:  Procedure Laterality Date   ANTERIOR CERVICAL DECOMP/DISCECTOMY FUSION N/A 07/30/2022   Procedure: C4-7 ANTERIOR CERVICAL DISCECTOMY AND FUSION (GLOBUS HEDRON);  Surgeon: Venetia Night, MD;  Location: [facility] ORS;  Service: Neurosurgery;  Laterality: N/A;   BILATERAL CARPAL TUNNEL RELEASE     CERVICAL WOUND DEBRIDEMENT N/A 08/01/2022   Procedure: CERVICAL WOUND DEBRIDEMENT OF HEMATOMA;  Surgeon: Venetia Night, MD;  Location: [facility] ORS;  Service: Neurosurgery;  Laterality: N/A;   FRACTURE SURGERY Left 1984   intramedullary rod   hip bone transplant  1984   LEG SURGERY Left    oseotomy   REPAIR ANKLE LIGAMENT Right 1978   REPAIR KNEE LIGAMENT Bilateral    acl   TRIGGER FINGER RELEASE Bilateral    thumbs   Patient Active Problem List   Diagnosis Date Noted   Postoperative hematoma of musculoskeletal structure following  musculoskeletal procedure 08/01/2022   Cervical myelopathy (HCC) 07/30/2022   Cervical spinal stenosis 07/30/2022   Radiculopathy, cervical 07/30/2022   S/P cervical spinal fusion 07/30/2022   Vitamin D deficiency 06/25/2022   B12 deficiency 06/25/2022   Elevated LDL cholesterol level 06/25/2022   Prediabetes 03/27/2021   History of nausea- reported 01/27/2020 02/02/2020   Accidental fall from ladder 02/02/2020   Acute right-sided thoracic back pain 02/02/2020   Neck stiffness 02/02/2020   Concussion with no loss of consciousness 02/02/2020   Multiple falls 02/02/2020   Head trauma, initial encounter 02/02/2020   Acute post-traumatic headache, not intractable 02/02/2020   Essential hypertension 08/26/2018   Pre-bariatric surgery nutrition evaluation 06/19/2018   Migraine with vertigo 01/05/2014   Arthralgia of multiple joints 04/28/2009   Carpal tunnel syndrome 08/30/2008    ONSET DATE: summer 2022  REFERRING DIAG: LE weakness and gait instability   THERAPY DIAG:  Muscle weakness (generalized)  Unsteadiness on feet  Difficulty in walking, not elsewhere classified  Rationale for Evaluation and Treatment: Rehabilitation  SUBJECTIVE:  SUBJECTIVE STATEMENT: Patient returning after two month absence.  Patient reports a few falls, getting caught into something and falling over.  Patient reports he is full body shaking and having difficulty with coordination of hands.   Pt accompanied by: self  PERTINENT HISTORY:  Patient presents for LE weakness and gait instability. He is s/p ACDF C-7 on 07/30/22. Patient has a leg length discrepancy and limited L knee flexion since his 20's. Patient has a lot of atrophy and lower back pain, foot drop bilaterally.  First symptoms was stumbling up stairs, with  LLE. Foot drop was improved after ACDF. Now has numbness and tingling at the end of the day. Does have a few lesions in his pons per wife, on blood pressure medication now. PMH includes ADHD, arthritis, concussion COVID, HTN, multiple lacunar infarcts, MVP, pernicious anemia, rocky mountain spotted fever, spinal stenosis, vestibular migraine, vitamin B deficiency.   PAIN:  Are you having pain?  Denies pain   PRECAUTIONS: Other: cervical; no lifting more than 20 lb   WEIGHT BEARING RESTRICTIONS: No  FALLS: Has patient fallen in last 6 months? Yes. Number of falls multiple a week  LIVING ENVIRONMENT: Lives with: lives with their family Lives in: House/apartment Stairs: Yes: Internal: flight steps; is sleeping downstairs Has following equipment at home: Single point cane  PLOF: Independent, uses a walking stick  PATIENT GOALS: gait control of feet, strengthen legs, balance, atrophy in hand  OBJECTIVE:   DIAGNOSTIC FINDINGS:  09/11/22: No acute fracture. No spondylolisthesis. No compression deformities. Disc space narrowing C3-4 with marginal osteophytes consistent with degenerative disc disease. Postop changes ACDF C4-C7 with anatomic alignment. Normal prevertebral and cervicocranial soft tissues.   06/19/22: 1. L5 chronic pars defects with L5-S1 anterolisthesis and advanced disc degeneration causing marked biforaminal L5 impingement. 2. Degeneration and short pedicles causes compressive spinal stenosis from L1-2 to L3-4, most advanced at L2-3. Moderate bilateral foraminal narrowing at the same levels.   COGNITION: Overall cognitive status: Within functional limits for tasks assessed    COORDINATION: Limited LLE  MUSCLE LENGTH: Significant limitation L hamstring; 60 degree flexion deficit.   POSTURE:  weight shift to the R  LOWER EXTREMITY ROM:     L knee flexion contraction -60 contraction LOWER EXTREMITY MMT:    MMT Right Eval R 5/15 R 8/22 Left Eval L 5/15 L  8/22  Hip flexion 4 5 5 4  4+ 4  Hip extension        Hip abduction 4- 4 4 4- 4 4  Hip adduction 4- 4 4 4- 4- 4  Hip internal rotation        Hip external rotation        Knee flexion 4- 4 4 3  4- 4-  Knee extension 4 4+ 5 4 4  4-  Ankle dorsiflexion 3 3 3+ 2+ 2+ 2+  Ankle plantarflexion 3 3+ 4- 3 3+ 3+  Ankle inversion        Ankle eversion        (Blank rows = not tested)  01/02/23 Right 8/22: RECERT  10/17 PN Left 8/22: Recert LUE 10/17: PN   Shoulder Flexion 4 4+ 5 4- 4+ 4+  Shoulder Abduction 4- 4 4+ 4- 4 4+  Shoulder Extension 4- 4+ 4 4- 4   Shoulder adduction 4- 4  3+ 4 4+  Bicep  4 4+ 5 4 4+ 4  Triceps 4- 4- 5 3+ 4 4     Right Left  Hip flexion 12.2 10.2  Hip Abduction 7.4 7.0  Hip Adduction 10.8 10.5  Knee Extension  13.8 13.4  Knee Flexion 13.4 13.2  DF    PF       BED MOBILITY:  Perform next session  TRANSFERS: Assistive device utilized: Single point cane  Sit to stand: CGA Stand to sit: CGA Chair to chair: CGA   GAIT: Gait pattern: L knee limited extension, L foot drop, L foot eversion Distance walked: 1200 Assistive device utilized: Single point cane Level of assistance: CGA Comments: Patient has increased eversion and rolling of L foot, LLE leg length discrepancy.   FUNCTIONAL TESTS:  5 times sit to stand: 5.6 seconds with minor use of LLE no UE support Timed up and go (TUG): n/a 6 minute walk test: 1200 ft 10 meter walk test: 7 seconds BERG: 36/56   PATIENT SURVEYS:  FOTO 54  TODAY'S TREATMENT:                                                                                                                              DATE: 10/24/23 Unless otherwise stated, CGA was provided and gait belt donned in order to ensure pt safety   Physical therapy treatment session today consisted of completing assessment of goals and administration of testing as demonstrated and documented in flow sheet, treatment, and goals section of this note. Addition  treatments may be found below.    [facility] PT Assessment - 10/24/23 0001       Standardized Balance Assessment   Standardized Balance Assessment Berg Balance Test      Berg Balance Test   Sit to Stand Able to stand  independently using hands    Standing Unsupported Able to stand 30 seconds unsupported    Sitting with Back Unsupported but Feet Supported on Floor or Stool Able to sit safely and securely 2 minutes    Stand to Sit Uses backs of legs against chair to control descent    Transfers Able to transfer with verbal cueing and /or supervision    Standing Unsupported with Eyes Closed Able to stand 3 seconds    Standing Unsupported with Feet Together Able to place feet together independently but unable to hold for 30 seconds    From Standing, Reach Forward with Outstretched Arm Reaches forward but needs supervision    From Standing Position, Pick up Object from Floor Able to pick up shoe, needs supervision    From Standing Position, Turn to Look Behind Over each Shoulder Turn sideways only but maintains balance    Turn 360 Degrees Needs close supervision or verbal cueing    Standing Unsupported, Alternately Place Feet on Step/Stool Able to complete >2 steps/needs minimal assist    Standing Unsupported, One Foot in Front Loses balance while stepping or standing    Standing on One Leg Unable to try or needs assist to prevent fall    Total Score 25  PATIENT EDUCATION: Education details: goals, POC, educated on safety during updated exercises  Person educated: Patient and Spouse Education method: Explanation, Demonstration, Tactile cues, and Verbal cues Education comprehension: verbalized understanding, returned demonstration, verbal cues required, and tactile cues required  HOME EXERCISE PROGRAM: Access Code: [email or web address] URL: [email or web address] Date: 10/11/2022 Prepared by: Precious Bard  Exercises - Seated Ankle Alphabet  - 1 x daily - 7 x weekly -  2 sets - 10 reps - 5 hold - Seated Heel Toe Raises  - 1 x daily - 7 x weekly - 2 sets - 10 reps - 5 hold - Standing March with Counter Support  - 1 x daily - 7 x weekly - 2 sets - 10 reps - 5 hold  Access Code: [email or web address] URL: [email or web address] Date: 10/30/2022 Prepared by: Precious Bard  Exercises - Seated Ankle Plantarflexion with Resistance  - 1 x daily - 7 x weekly - 2 sets - 10 reps - 5 hold - Seated Ankle Eversion with Resistance  - 1 x daily - 7 x weekly - 2 sets - 10 reps - 5 hold  Access Code: [email or web address] URL: [email or web address] Date: 11/14/2022 Prepared by: Precious Bard  Exercises - Seated Heel Toe Raises  - 1 x daily - 7 x weekly - 2 sets - 10 reps - 5 hold - Seated Hip Internal Rotation AROM  - 1 x daily - 7 x weekly - 2 sets - 10 reps - 5 hold - Bilateral Long Arc Quad  - 1 x daily - 7 x weekly - 2 sets - 10 reps - 5 hold - Foam Balance Tandem stance  - 1 x daily - 7 x weekly - 2 sets - 2 reps - 30 hold - Single Leg Balance on Foam  - 1 x daily - 7 x weekly - 2 sets - 30 reps - 2 hold - Seated Hip Flexion March with Ankle Weights  - 1 x daily - 7 x weekly - 2 sets - 10 reps - 5 hold - Standing Hip Abduction with Ankle Weight  - 1 x daily - 7 x weekly - 2 sets - 10 reps - 5 hold - Standing Hip Extension with Ankle Weight  - 1 x daily - 7 x weekly - 2 sets - 10 reps - 5 hold - Standing Hip Flexion with Ankle Weight  - 1 x daily - 7 x weekly - 2 sets - 10 reps - 5 hold - Seated Long Arc Quad with Ankle Weight  - 1 x daily - 7 x weekly - 2 sets - 10 reps - 5 hold Access Code: [email or web address] URL: [email or web address] Date: 08/22/2023 Prepared by: Precious Bard  Exercises - Seated Finger Tip Pinch with Putty  - 1 x daily - 7 x weekly - 2 sets - 10 reps - 5 hold - Hand Squeezes  - 1 x daily - 7 x weekly - 2 sets - 10 reps - 5 hold - Key Pinch with Putty  - 1 x daily - 7 x weekly - 2 sets - 10 reps - 5 hold  GOALS: Goals reviewed with  patient? Yes  SHORT TERM GOALS: Target date: 05/09/2023   Patient will be independent in home exercise program to improve strength/mobility for better functional independence with ADLs. Baseline: 2/21: HEP give next session 5/15 : intermittent compliance 8/22: intermittent compliance due to illness 11/14: compliant  Goal status: MET    LONG TERM GOALS: Target date: 01/16/2024    Patient will increase  FOTO score to equal to or greater than  64%   to demonstrate statistically significant improvement in mobility and quality of life.  Baseline: 2/21: 54% 8/22: 52% 10/17: 57% 11/14: 53% 1/16: 47% Goal status: terminated  2.  Patient will increase Berg Balance score by > 6 points (42/56)to demonstrate decreased fall risk during functional activities. Baseline: 2/21: 36/56 5/15: 45   Goal status: MET  3.  Patient will increase six minute walk test distance to >1500 for progression to age norm community ambulator and improve gait ability Baseline: 2/21: 1200 ft with [facility] 5/15: 1300 ft with [facility]  8/22: 905 ft with [facility] 10/17: 1100 ft with [facility] 11/14: 1110 ft with [facility] 1/16: 1210 ft with [facility] 3/6: 1100 ft with two canes Goal status: In progress  4.  Patient will increase BLE gross strength to 4+/5  or within 1 lb of each LE as to improve functional strength for independent gait, increased standing tolerance and increased ADL ability. Baseline: 2/21 :see above 5/15: see above 10/17: RLE 5/5 LLE 4/5 L ankle 3/5  11/14: RLE 5/5 LLE 4+/5 hamstring 4/5, ankle: 3+/5 1/16: RLE 5/5 LLE 4+/5 ankle 4-/5 3/6: see above  Goal status:  In progress / modified  6.  Patient will increase Berg Balance score by > 6 points (51/56) to demonstrate decreased fall risk during functional activities. Baseline:  5/15: 45 8/22: 41/56 10/17: 28 11/14: 36 1/16: 36 3/6: 25 Goal status: Ongoing  7.   Patient will increase UE strength to >4+/5 for ADLs, AD usage, and quality of life.  Baseline: 5/15: see above 8/22: see above  11/14: >4+/5 bilaterally  Goal status: MET  8.   Patient will increase lower extremity functional scale to >60/80 to demonstrate improved functional mobility and increased tolerance with ADLs.  Baseline: 11/14: 29%  1/16: 29%  3/6: 19%  Goal status: ongoing   ASSESSMENT:  CLINICAL IMPRESSION:  Patient's balance has decreased during his two months absence. He has increased forward trunk flexion resulting in back stepping to return stability.  He has decreased capacity from prolonged ambulation as can be seen in 6 MWT. Patient's absence from therapy has demonstrated a significant need for continued therapy in order to progress his  mobility, stability, and strength.  Patient will benefit from skilled physical therapy to improve strength, mobility, and stability for improved quality of life.   OBJECTIVE IMPAIRMENTS: Abnormal gait, decreased activity tolerance, decreased balance, decreased coordination, decreased endurance, decreased mobility, difficulty walking, decreased ROM, decreased strength, hypomobility, impaired flexibility, improper body mechanics, and postural dysfunction.   ACTIVITY LIMITATIONS: carrying, lifting, bending, standing, squatting, stairs, transfers, reach over head, locomotion level, and caring for others  PARTICIPATION LIMITATIONS: meal prep, cleaning, laundry, personal finances, driving, shopping, community activity, and yard work  PERSONAL FACTORS: Age, Past/current experiences, Time since onset of injury/illness/exacerbation, and 3+ comorbidities: DHD, arthritis, concussion COVID, HTN, multiple lacunar infarcts, MVP, pernicious anemia, rocky mountain spotted fever, spinal stenosis, vestibular migraine, vitamin B deficiency.   are also affecting patient's functional outcome.   REHAB POTENTIAL: Good  CLINICAL DECISION MAKING: Evolving/moderate complexity  EVALUATION COMPLEXITY: Moderate  PLAN:   PT FREQUENCY: 1x/week  PT DURATION: 12 weeks  PLANNED  INTERVENTIONS: Therapeutic exercises, Therapeutic activity, Neuromuscular re-education, Balance training, Gait training, Patient/Family education, Self Care, Joint mobilization, Stair training, Vestibular training, Canalith repositioning, Visual/preceptual remediation/compensation, Orthotic/Fit training, DME instructions, Cognitive remediation, Electrical stimulation, Spinal mobilization, Cryotherapy, Moist heat, Splintting, Taping, Traction, Ultrasound, Manual therapy, and Re-evaluation  PLAN FOR NEXT  SESSION:  balance, strength, unstable surfaces    Precious Bard, PT, DPT Physical Therapist - [facility]  Outpatient Physical Therapy- Main Campus [phone]

## 2023-10-24 ENCOUNTER — Ambulatory Visit: Payer: 59 | Attending: Neurosurgery

## 2023-10-24 DIAGNOSIS — R262 Difficulty in walking, not elsewhere classified: Secondary | ICD-10-CM | POA: Insufficient documentation

## 2023-10-24 DIAGNOSIS — R2681 Unsteadiness on feet: Secondary | ICD-10-CM | POA: Diagnosis not present

## 2023-10-24 DIAGNOSIS — M6281 Muscle weakness (generalized): Secondary | ICD-10-CM | POA: Insufficient documentation

## 2023-10-24 DIAGNOSIS — F840 Autistic disorder: Secondary | ICD-10-CM | POA: Diagnosis not present

## 2023-10-24 DIAGNOSIS — R4681 Obsessive-compulsive behavior: Secondary | ICD-10-CM | POA: Diagnosis not present

## 2023-10-24 DIAGNOSIS — F0670 Mild neurocognitive disorder due to known physiological condition without behavioral disturbance: Secondary | ICD-10-CM | POA: Diagnosis not present

## 2023-10-29 ENCOUNTER — Other Ambulatory Visit: Payer: Self-pay

## 2023-10-29 ENCOUNTER — Encounter: Payer: Self-pay | Admitting: *Deleted

## 2023-10-29 ENCOUNTER — Emergency Department

## 2023-10-29 ENCOUNTER — Emergency Department
Admission: EM | Admit: 2023-10-29 | Discharge: 2023-10-30 | Disposition: A | Discharge status: Home / Self Care | Attending: Emergency Medicine | Admitting: Emergency Medicine

## 2023-10-29 DIAGNOSIS — R42 Dizziness and giddiness: Secondary | ICD-10-CM | POA: Diagnosis not present

## 2023-10-29 DIAGNOSIS — M6281 Muscle weakness (generalized): Secondary | ICD-10-CM | POA: Diagnosis not present

## 2023-10-29 DIAGNOSIS — I10 Essential (primary) hypertension: Secondary | ICD-10-CM | POA: Diagnosis not present

## 2023-10-29 DIAGNOSIS — I6782 Cerebral ischemia: Secondary | ICD-10-CM | POA: Diagnosis not present

## 2023-10-29 DIAGNOSIS — R2681 Unsteadiness on feet: Secondary | ICD-10-CM | POA: Diagnosis not present

## 2023-10-29 DIAGNOSIS — R55 Syncope and collapse: Secondary | ICD-10-CM | POA: Diagnosis not present

## 2023-10-29 DIAGNOSIS — R262 Difficulty in walking, not elsewhere classified: Secondary | ICD-10-CM | POA: Diagnosis not present

## 2023-10-29 LAB — BASIC METABOLIC PANEL
Anion gap: 7 (ref 5–15)
BUN: 13 mg/dL (ref 8–23)
CO2: 29 mmol/L (ref 22–32)
Calcium: 9.3 mg/dL (ref 8.9–10.3)
Chloride: 102 mmol/L (ref 98–111)
Creatinine, Ser: 0.78 mg/dL (ref 0.61–1.24)
GFR, Estimated: >60 mL/min (ref >60)
Glucose, Bld: 116 mg/dL — ABNORMAL HIGH (ref 70–99)
Potassium: 4.3 mmol/L (ref 3.5–5.1)
Sodium: 138 mmol/L (ref 135–145)

## 2023-10-29 LAB — CBC
HCT: 48.5 % (ref 39.0–52.0)
Hemoglobin: 16.3 g/dL (ref 13.0–17.0)
MCH: 31 pg (ref 26.0–34.0)
MCHC: 33.6 g/dL (ref 30.0–36.0)
MCV: 92.2 fL (ref 80.0–100.0)
Platelets: 246 10*3/uL (ref 150–400)
RBC: 5.26 MIL/uL (ref 4.22–5.81)
RDW: 12.4 % (ref 11.5–15.5)
WBC: 7.1 10*3/uL (ref 4.0–10.5)
nRBC: 0 % (ref 0.0–0.2)

## 2023-10-29 LAB — TROPONIN I (HIGH SENSITIVITY): Troponin I (High Sensitivity): 6 ng/L (ref <18)

## 2023-10-29 MED ORDER — LORAZEPAM 1 MG PO TABS
1.0000 mg | ORAL_TABLET | Freq: Once | ORAL | Status: AC
  Administered 2023-10-29: 1 mg via ORAL
  Filled 2023-10-29: qty 1

## 2023-10-29 NOTE — ED Provider Notes (Signed)
 [facility] Provider Note    Event Date/Time   First MD Initiated Contact with Patient 10/29/23 2146     (approximate)  History   Chief Complaint: Dizziness  HPI  [name] is a 63 y.o. male with a past medical history of ADHD, hypertension, lacunar infarcts in the past, vestibular migraines, presents to the emergency department for acute onset of significant dizziness/spinning sensation around 7 PM tonight.  According to the patient he was bending over and when he stood up he had acute onset of significant dizziness which she describes as a spinning sensation starting at 7 PM tonight.  Patient has a history of vestibular migraines in the past, took his abortive medication, however he states the dizziness seemed more intense than usual and it occurred much more suddenly.  Patient states his blood pressure was significantly elevated and they were concerned so they came to the emergency department.  Patient has a history of a prior lacunar infarct but no significant infarct in the past.  Patient denies any weakness or numbness of any arm or leg.  No slurred speech or confusion.  Physical Exam   Triage Vital Signs: ED Triage Vitals [10/29/23 2108]  Encounter Vitals Group     BP (!) 193/100     Systolic BP Percentile      Diastolic BP Percentile      Pulse Rate 83     Resp 20     Temp 98 F (36.7 C)     Temp Source Oral     SpO2 97 %     Weight 146 lb (66.2 kg)     Height 5\' 7"  (1.702 m)     Head Circumference      Peak Flow      Pain Score 0     Pain Loc      Pain Education      Exclude from Growth Chart     Most recent vital signs: Vitals:   10/29/23 2108  BP: (!) 193/100  Pulse: 83  Resp: 20  Temp: 98 F (36.7 C)  SpO2: 97%    General: Awake, no distress.  CV:  Good peripheral perfusion.  Regular rate and rhythm  Resp:  Normal effort.  Equal breath sounds bilaterally.  Abd:  No distention.  Soft, nontender.  No rebound or  guarding. Other:  Equal grip strength bilaterally.  No pronator drift.  5/5 extremity strength, no cranial nerve deficits identified.   ED Results / Procedures / Treatments   EKG  EKG viewed and interpreted by myself shows a normal sinus rhythm at 78 bpm with a narrow QRS, normal axis, normal intervals, no concerning ST changes.  RADIOLOGY  MRI pending   MEDICATIONS ORDERED IN ED: Medications  LORazepam (ATIVAN) tablet 1 mg (has no administration in time range)     IMPRESSION / MDM / ASSESSMENT AND PLAN / ED COURSE  I reviewed the triage vital signs and the nursing notes.  Patient's presentation is most consistent with acute presentation with potential threat to life or bodily function.  Patient presents emergency department for acute onset of dizziness which she describes as a spinning sensation starting at 7 PM tonight.  I have personally seen and evaluated the patient do not believe his symptoms are suggestive of CVA they seem much more vestibular/vertiginous.  Given the patient has a history of vestibular migraines highly suspect that is likely the cause of the patient's symptoms tonight.  However given his  history of lacunar infarct hypertension and acute onset of symptoms we will obtain an MRI as a precaution.  Patient CBC chemistry and troponin are reassuring.  Patient states his symptoms have already dramatically improved since taking his triptan medication after the symptoms began which he typically takes for his vestibular migraines.  Given the patient's improving symptoms with reassuring neurological exam and history of vestibular migraines we do not believe the patient meets code stroke criteria.  We will get the workup performed as well as the MRI as a precaution.  Patient agreeable to plan of care.  Patient's workup shows a reassuring CBC and chemistry.  Negative troponin.  MRI is pending.  Patient care signed out to oncoming provider.  FINAL CLINICAL IMPRESSION(S) / ED  DIAGNOSES   Dizziness   Note:  This document was prepared using Dragon voice recognition software and may include unintentional dictation errors.   Minna Antis, MD 10/29/23 2242

## 2023-10-29 NOTE — ED Notes (Signed)
 Patient to MRI at this time.

## 2023-10-29 NOTE — ED Triage Notes (Signed)
 Pt to triage via wheelchair.  Pt reports dizziness when bending over to tie shoes this evening. .  Pt did not pass out or fall.  Pt reports htn.  Pt denies headache.  No n/v/d   no chest pain or sob.  Pt alert  speech clear.

## 2023-10-29 NOTE — ED Notes (Signed)
 Reviewed pt's triage notes....Marland KitchenMarland KitchenDr Lenard Lance called to triage for [facility]

## 2023-10-30 ENCOUNTER — Ambulatory Visit (INDEPENDENT_AMBULATORY_CARE_PROVIDER_SITE_OTHER): Payer: 59 | Admitting: Nurse Practitioner

## 2023-10-30 ENCOUNTER — Encounter: Payer: Self-pay | Admitting: Nurse Practitioner

## 2023-10-30 ENCOUNTER — Ambulatory Visit: Payer: 59 | Admitting: Nurse Practitioner

## 2023-10-30 VITALS — BP 105/74 | HR 77 | Ht 67.0 in | Wt 144.2 lb

## 2023-10-30 DIAGNOSIS — E559 Vitamin D deficiency, unspecified: Secondary | ICD-10-CM | POA: Diagnosis not present

## 2023-10-30 DIAGNOSIS — R42 Dizziness and giddiness: Secondary | ICD-10-CM

## 2023-10-30 DIAGNOSIS — E538 Deficiency of other specified B group vitamins: Secondary | ICD-10-CM

## 2023-10-30 DIAGNOSIS — G939 Disorder of brain, unspecified: Secondary | ICD-10-CM | POA: Diagnosis not present

## 2023-10-30 DIAGNOSIS — G629 Polyneuropathy, unspecified: Secondary | ICD-10-CM | POA: Diagnosis not present

## 2023-10-30 DIAGNOSIS — Z8673 Personal history of transient ischemic attack (TIA), and cerebral infarction without residual deficits: Secondary | ICD-10-CM | POA: Diagnosis not present

## 2023-10-30 DIAGNOSIS — G43119 Migraine with aura, intractable, without status migrainosus: Secondary | ICD-10-CM | POA: Diagnosis not present

## 2023-10-30 DIAGNOSIS — I10 Essential (primary) hypertension: Secondary | ICD-10-CM

## 2023-10-30 DIAGNOSIS — G250 Essential tremor: Secondary | ICD-10-CM | POA: Diagnosis not present

## 2023-10-30 DIAGNOSIS — G43809 Other migraine, not intractable, without status migrainosus: Secondary | ICD-10-CM | POA: Diagnosis not present

## 2023-10-30 MED ORDER — LORAZEPAM 0.5 MG PO TABS: 0.5000 mg | ORAL_TABLET | Freq: Two times a day (BID) | ORAL | 0 refills | Status: AC | PRN

## 2023-10-30 MED ORDER — CYANOCOBALAMIN 1000 MCG/ML IJ SOLN
1000.0000 ug | Freq: Once | INTRAMUSCULAR | Status: AC
  Administered 2023-10-30: 1000 ug via INTRAMUSCULAR

## 2023-10-30 NOTE — Assessment & Plan Note (Signed)
 Labs ordered at visit today.  Will make recommendations based on lab results. B12 injection given during visit.

## 2023-10-30 NOTE — Discharge Instructions (Signed)
 Please follow-up as planned with your primary care provider and neurology team tomorrow.

## 2023-10-30 NOTE — ED Provider Notes (Signed)
-----------------------------------------   12:52 AM on 10/30/2023 -----------------------------------------   Blood pressure 130/82, pulse 74, temperature 98 F (36.7 C), temperature source Oral, resp. rate 17, height 5\' 7"  (1.702 m), weight 66.2 kg, SpO2 96%.  Received signout on patient.  63 year old male with history of vestibular migraines presenting today for dizziness.  Took his regular home medication and was feeling better at this time but patient signed out pending MRI of brain.  MRI brain shows no acute pathology.  Patient was reassessed and feels completely back to baseline at this time.  Suspect it was a different presentation of his known vestibular migraines.  Will discharge patient has follow-up with his primary care provider and neurology tomorrow.   Janith Lima, MD 10/30/23 [phone]

## 2023-10-30 NOTE — Progress Notes (Signed)
 BP 105/74 (BP Location: Left Arm, Patient Position: Sitting, Cuff Size: Large)   Pulse 77   Ht 5\' 7"  (1.702 m)   Wt 144 lb 3.2 oz (65.4 kg)   SpO2 98%   BMI 22.58 kg/m    Subjective:    Patient ID: [name], male    DOB: 1961/06/04, 63 y.o.   MRN: [id number]  HPI: [name] is a 63 y.o. male  Chief Complaint  Patient presents with   Weight Loss    # month follow up,    blood work    Vitamin D and B12    HYPERTENSION with Chronic Kid[facility] Disease Dr. Cherylann Ratel changed Lisinopril to Amlodipine.  The lisinopril seemed to be contributing to his weight loss.  Hypertension status: controlled Satisfied with current treatment? no Duration of hypertension: years BP monitoring frequency:  not checking BP range:  BP medication side effects:  no Medication compliance: excellent compliance Previous BP meds:none Aspirin: no Recurrent headaches: no Visual changes: no Palpitations: no Dyspnea: no Chest pain: no Lower extremity edema: no Dizzy/lightheaded: no  Patient had an extreme bout of dizziness last evening.  He went to the ER with elevated BP and dizziness.  He states it did not feel like his vestibular migraines.  He is more stressed right now.  His Migraines are triggered by stress.  But this one was worse than prior. He did have significant improvement in symptoms with a dose of lorazepam last night.  Will be seeing Dr. Sherryll Burger this afternoon.  Does have a lot of anxiety on a regular basis.      Relevant past medical, surgical, family and social history reviewed and updated as indicated. Interim medical history since our last visit reviewed. Allergies and medications reviewed and updated.  Review of Systems  Eyes:  Negative for visual disturbance.  Respiratory:  Negative for shortness of breath.   Cardiovascular:  Negative for chest pain and leg swelling.  Neurological:  Positive for dizziness. Negative for light-headedness and headaches.   Psychiatric/Behavioral:  The patient is nervous/anxious.     Per HPI unless specifically indicated above     Objective:    BP 105/74 (BP Location: Left Arm, Patient Position: Sitting, Cuff Size: Large)   Pulse 77   Ht 5\' 7"  (1.702 m)   Wt 144 lb 3.2 oz (65.4 kg)   SpO2 98%   BMI 22.58 kg/m   Wt Readings from Last 3 Encounters:  10/30/23 144 lb 3.2 oz (65.4 kg)  10/29/23 146 lb (66.2 kg)  07/11/23 141 lb 3.2 oz (64 kg)    Physical Exam Vitals and nursing note reviewed.  Constitutional:      General: He is not in acute distress.    Appearance: Normal appearance. He is not ill-appearing, toxic-appearing or diaphoretic.  HENT:     Head: Normocephalic.     Right Ear: External ear normal.     Left Ear: External ear normal.     Nose: Nose normal. No congestion or rhinorrhea.     Mouth/Throat:     Mouth: Mucous membranes are moist.  Eyes:     General:        Right eye: No discharge.        Left eye: No discharge.     Extraocular Movements: Extraocular movements intact.     Conjunctiva/sclera: Conjunctivae normal.     Pupils: Pupils are equal, round, and reactive to light.  Cardiovascular:     Rate and Rhythm: Normal  rate and regular rhythm.     Heart sounds: No murmur heard. Pulmonary:     Effort: Pulmonary effort is normal. No respiratory distress.     Breath sounds: Normal breath sounds. No wheezing, rhonchi or rales.  Abdominal:     General: Abdomen is flat. Bowel sounds are normal.  Musculoskeletal:     Cervical back: Normal range of motion and neck supple.  Skin:    General: Skin is warm and dry.     Capillary Refill: Capillary refill takes less than 2 seconds.  Neurological:     General: No focal deficit present.     Mental Status: He is alert and oriented to person, place, and time.  Psychiatric:        Mood and Affect: Mood normal.        Behavior: Behavior normal.        Thought Content: Thought content normal.        Judgment: Judgment normal.      Results for orders placed or performed during the hospital encounter of 10/29/23  Basic metabolic panel   Collection Time: 10/29/23  9:11 PM  Result Value Ref Range   Sodium 138 135 - 145 mmol/L   Potassium 4.3 3.5 - 5.1 mmol/L   Chloride 102 98 - 111 mmol/L   CO2 29 22 - 32 mmol/L   Glucose, Bld 116 (H) 70 - 99 mg/dL   BUN 13 8 - 23 mg/dL   Creatinine, Ser 3.08 0.61 - 1.24 mg/dL   Calcium 9.3 8.9 - 65.7 mg/dL   GFR, Estimated >84 >69 mL/min   Anion gap 7 5 - 15  CBC   Collection Time: 10/29/23  9:11 PM  Result Value Ref Range   WBC 7.1 4.0 - 10.5 K/uL   RBC 5.26 4.22 - 5.81 MIL/uL   Hemoglobin 16.3 13.0 - 17.0 g/dL   HCT 62.9 52.8 - 41.3 %   MCV 92.2 80.0 - 100.0 fL   MCH 31.0 26.0 - 34.0 pg   MCHC 33.6 30.0 - 36.0 g/dL   RDW 24.4 01.0 - 27.2 %   Platelets 246 150 - 400 K/uL   nRBC 0.0 0.0 - 0.2 %  Troponin I (High Sensitivity)   Collection Time: 10/29/23  9:11 PM  Result Value Ref Range   Troponin I (High Sensitivity) 6 <18 ng/L      Assessment & Plan:   Problem List Items Addressed This Visit       Cardiovascular and Mediastinum   Essential hypertension   Chronic.  Difficult to determine whether elevations are due to anxiety vs Hypertension.  Recommend patient check blood pressure at home and bring log to next visit.  Will increase Amlodipine if needed at that time.       Relevant Medications   amLODipine (NORVASC) 5 MG tablet   Other Relevant Orders   Comp Met (CMET)     Other   Vitamin D deficiency - Primary   Labs ordered at visit today.  Will make recommendations based on lab results.        Relevant Orders   Vitamin D (25 hydroxy)   B12 deficiency   Labs ordered at visit today.  Will make recommendations based on lab results. B12 injection given during visit.        Relevant Orders   B12   Dizziness   Significant symptoms that started yesterday.  Improved with Lorazepam.  Side effects and benefits of medication discussed during visit.  Short supply given during visit.  Discussed getting anxiety under control with something such as SSRI.  Will discuss further in two weeks.  Keep appt with Neurologist for this afternoon.        Follow up plan: Return in about 2 weeks (around 11/13/2023) for BP Check.   A total of 30 minutes were spent on this encounter today.  When total time is documented, this includes both the face-to-face and non-face-to-face time personally spent before, during and after the visit on the date of the encounter symptoms, plan of care, imaging, medications and follow up.

## 2023-10-30 NOTE — Assessment & Plan Note (Signed)
 Significant symptoms that started yesterday.  Improved with Lorazepam.  Side effects and benefits of medication discussed during visit.  Short supply given during visit.  Discussed getting anxiety under control with something such as SSRI.  Will discuss further in two weeks.  Keep appt with Neurologist for this afternoon.

## 2023-10-30 NOTE — Progress Notes (Deleted)
   There were no vitals taken for this visit.   Subjective:    Patient ID: [name], male    DOB: 11/09/1960, 63 y.o.   MRN: [id number]  HPI: [name] is a 63 y.o. male  No chief complaint on file.   Relevant past medical, surgical, family and social history reviewed and updated as indicated. Interim medical history since our last visit reviewed. Allergies and medications reviewed and updated.  Review of Systems  Per HPI unless specifically indicated above     Objective:    There were no vitals taken for this visit.  Wt Readings from Last 3 Encounters:  10/29/23 146 lb (66.2 kg)  07/11/23 141 lb 3.2 oz (64 kg)  05/28/23 139 lb 9.6 oz (63.3 kg)    Physical Exam  Results for orders placed or performed during the hospital encounter of 10/29/23  Basic metabolic panel   Collection Time: 10/29/23  9:11 PM  Result Value Ref Range   Sodium 138 135 - 145 mmol/L   Potassium 4.3 3.5 - 5.1 mmol/L   Chloride 102 98 - 111 mmol/L   CO2 29 22 - 32 mmol/L   Glucose, Bld 116 (H) 70 - 99 mg/dL   BUN 13 8 - 23 mg/dL   Creatinine, Ser 3.66 0.61 - 1.24 mg/dL   Calcium 9.3 8.9 - 44.0 mg/dL   GFR, Estimated >34 >74 mL/min   Anion gap 7 5 - 15  CBC   Collection Time: 10/29/23  9:11 PM  Result Value Ref Range   WBC 7.1 4.0 - 10.5 K/uL   RBC 5.26 4.22 - 5.81 MIL/uL   Hemoglobin 16.3 13.0 - 17.0 g/dL   HCT 25.9 56.3 - 87.5 %   MCV 92.2 80.0 - 100.0 fL   MCH 31.0 26.0 - 34.0 pg   MCHC 33.6 30.0 - 36.0 g/dL   RDW 64.3 32.9 - 51.8 %   Platelets 246 150 - 400 K/uL   nRBC 0.0 0.0 - 0.2 %  Troponin I (High Sensitivity)   Collection Time: 10/29/23  9:11 PM  Result Value Ref Range   Troponin I (High Sensitivity) 6 <18 ng/L      Assessment & Plan:   Problem List Items Addressed This Visit   None    Follow up plan: No follow-ups on file.

## 2023-10-30 NOTE — Assessment & Plan Note (Signed)
 Labs ordered at visit today.  Will make recommendations based on lab results.

## 2023-10-30 NOTE — Therapy (Signed)
 OUTPATIENT PHYSICAL THERAPY NEURO TREATMENT/ RECERT     Patient Name: [name] MRN: [id number] DOB:[birth year], 63 y.o., male Today's Date: 10/30/2023   PCP: Larae Grooms NP REFERRING PROVIDER: Susanne Borders PA  END OF SESSION:                               Past Medical History:  Diagnosis Date   ADHD    Arthritis    Concussion    COVID 12/2020   Hypertension    Multiple lacunar infarcts (HCC)    MVP (mitral valve prolapse)    Pernicious anemia    [facility] spotted fever    Spinal stenosis    Vestibular migraine    Vitamin B12 deficiency    Vitamin D deficiency    Past Surgical History:  Procedure Laterality Date   ANTERIOR CERVICAL DECOMP/DISCECTOMY FUSION N/A 07/30/2022   Procedure: C4-7 ANTERIOR CERVICAL DISCECTOMY AND FUSION (GLOBUS HEDRON);  Surgeon: Venetia Night, MD;  Location: [facility] ORS;  Service: Neurosurgery;  Laterality: N/A;   BILATERAL CARPAL TUNNEL RELEASE     CERVICAL WOUND DEBRIDEMENT N/A 08/01/2022   Procedure: CERVICAL WOUND DEBRIDEMENT OF HEMATOMA;  Surgeon: Venetia Night, MD;  Location: [facility] ORS;  Service: Neurosurgery;  Laterality: N/A;   FRACTURE SURGERY Left 1984   intramedullary rod   hip bone transplant  1984   LEG SURGERY Left    oseotomy   REPAIR ANKLE LIGAMENT Right 1978   REPAIR KNEE LIGAMENT Bilateral    acl   TRIGGER FINGER RELEASE Bilateral    thumbs   Patient Active Problem List   Diagnosis Date Noted   Dizziness 10/30/2023   Postoperative hematoma of musculoskeletal structure following musculoskeletal procedure 08/01/2022   Cervical myelopathy (HCC) 07/30/2022   Cervical spinal stenosis 07/30/2022   Radiculopathy, cervical 07/30/2022   S/P cervical spinal fusion 07/30/2022   Vitamin D deficiency 06/25/2022   B12 deficiency 06/25/2022   Elevated LDL cholesterol level 06/25/2022   Prediabetes 03/27/2021   History of nausea- reported 01/27/2020 02/02/2020    Accidental fall from ladder 02/02/2020   Acute right-sided thoracic back pain 02/02/2020   Neck stiffness 02/02/2020   Concussion with no loss of consciousness 02/02/2020   Multiple falls 02/02/2020   Head trauma, initial encounter 02/02/2020   Acute post-traumatic headache, not intractable 02/02/2020   Essential hypertension 08/26/2018   Pre-bariatric surgery nutrition evaluation 06/19/2018   Migraine with vertigo 01/05/2014   Arthralgia of multiple joints 04/28/2009   Carpal tunnel syndrome 08/30/2008    ONSET DATE: summer 2022  REFERRING DIAG: LE weakness and gait instability   THERAPY DIAG:  No diagnosis found.  Rationale for Evaluation and Treatment: Rehabilitation  SUBJECTIVE:  SUBJECTIVE STATEMENT: Patient went to the ED Tuesday for dizziness. Saw physician yesterday.   Pt accompanied by: self  PERTINENT HISTORY:  Patient presents for LE weakness and gait instability. He is s/p ACDF C-7 on 07/30/22. Patient has a leg length discrepancy and limited L knee flexion since his 20's. Patient has a lot of atrophy and lower back pain, foot drop bilaterally.  First symptoms was stumbling up stairs, with LLE. Foot drop was improved after ACDF. Now has numbness and tingling at the end of the day. Does have a few lesions in his pons per wife, on blood pressure medication now. PMH includes ADHD, arthritis, concussion COVID, HTN, multiple lacunar infarcts, MVP, pernicious anemia, rocky mountain spotted fever, spinal stenosis, vestibular migraine, vitamin B deficiency.   PAIN:  Are you having pain?  Denies pain   PRECAUTIONS: Other: cervical; no lifting more than 20 lb   WEIGHT BEARING RESTRICTIONS: No  FALLS: Has patient fallen in last 6 months? Yes. Number of falls multiple a week  LIVING  ENVIRONMENT: Lives with: lives with their family Lives in: House/apartment Stairs: Yes: Internal: flight steps; is sleeping downstairs Has following equipment at home: Single point cane  PLOF: Independent, uses a walking stick  PATIENT GOALS: gait control of feet, strengthen legs, balance, atrophy in hand  OBJECTIVE:   DIAGNOSTIC FINDINGS:  09/11/22: No acute fracture. No spondylolisthesis. No compression deformities. Disc space narrowing C3-4 with marginal osteophytes consistent with degenerative disc disease. Postop changes ACDF C4-C7 with anatomic alignment. Normal prevertebral and cervicocranial soft tissues.   06/19/22: 1. L5 chronic pars defects with L5-S1 anterolisthesis and advanced disc degeneration causing marked biforaminal L5 impingement. 2. Degeneration and short pedicles causes compressive spinal stenosis from L1-2 to L3-4, most advanced at L2-3. Moderate bilateral foraminal narrowing at the same levels.   COGNITION: Overall cognitive status: Within functional limits for tasks assessed    COORDINATION: Limited LLE  MUSCLE LENGTH: Significant limitation L hamstring; 60 degree flexion deficit.   POSTURE:  weight shift to the R  LOWER EXTREMITY ROM:     L knee flexion contraction -60 contraction LOWER EXTREMITY MMT:    MMT Right Eval R 5/15 R 8/22 Left Eval L 5/15 L 8/22  Hip flexion 4 5 5 4  4+ 4  Hip extension        Hip abduction 4- 4 4 4- 4 4  Hip adduction 4- 4 4 4- 4- 4  Hip internal rotation        Hip external rotation        Knee flexion 4- 4 4 3  4- 4-  Knee extension 4 4+ 5 4 4  4-  Ankle dorsiflexion 3 3 3+ 2+ 2+ 2+  Ankle plantarflexion 3 3+ 4- 3 3+ 3+  Ankle inversion        Ankle eversion        (Blank rows = not tested)  01/02/23 Right 8/22: RECERT  10/17 PN Left 8/22: Recert LUE 10/17: PN   Shoulder Flexion 4 4+ 5 4- 4+ 4+  Shoulder Abduction 4- 4 4+ 4- 4 4+  Shoulder Extension 4- 4+ 4 4- 4   Shoulder adduction 4- 4  3+ 4 4+   Bicep  4 4+ 5 4 4+ 4  Triceps 4- 4- 5 3+ 4 4     Right Left  Hip flexion 12.2 10.2  Hip Abduction 7.4 7.0  Hip Adduction 10.8 10.5  Knee Extension  13.8 13.4  Knee Flexion 13.4 13.2  DF  PF       BED MOBILITY:  Perform next session  TRANSFERS: Assistive device utilized: Single point cane  Sit to stand: CGA Stand to sit: CGA Chair to chair: CGA   GAIT: Gait pattern: L knee limited extension, L foot drop, L foot eversion Distance walked: 1200 Assistive device utilized: Single point cane Level of assistance: CGA Comments: Patient has increased eversion and rolling of L foot, LLE leg length discrepancy.   FUNCTIONAL TESTS:  5 times sit to stand: 5.6 seconds with minor use of LLE no UE support Timed up and go (TUG): n/a 6 minute walk test: 1200 ft 10 meter walk test: 7 seconds BERG: 36/56   PATIENT SURVEYS:  FOTO 54  TODAY'S TREATMENT:                                                                                                                              DATE: 10/30/23 Unless otherwise stated, CGA was provided and gait belt donned in order to ensure pt safety   TherAct: Superset: x3 trials with 3lb ankle weights Sit to stand 10x Ambulate 150 ft  TherEx: Seated: LAQ 10x  Overhead weighted ball press 10x March 10x Chest press weighted ball 10x      PATIENT EDUCATION: Education details: goals, POC, educated on safety during updated exercises  Person educated: Patient and Spouse Education method: Explanation, Demonstration, Tactile cues, and Verbal cues Education comprehension: verbalized understanding, returned demonstration, verbal cues required, and tactile cues required  HOME EXERCISE PROGRAM: Access Code: [email or web address] URL: [email or web address] Date: 10/11/2022 Prepared by: Precious Bard  Exercises - Seated Ankle Alphabet  - 1 x daily - 7 x weekly - 2 sets - 10 reps - 5 hold - Seated Heel Toe Raises  - 1 x daily - 7 x weekly - 2  sets - 10 reps - 5 hold - Standing March with Counter Support  - 1 x daily - 7 x weekly - 2 sets - 10 reps - 5 hold  Access Code: [email or web address] URL: [email or web address] Date: 10/30/2022 Prepared by: Precious Bard  Exercises - Seated Ankle Plantarflexion with Resistance  - 1 x daily - 7 x weekly - 2 sets - 10 reps - 5 hold - Seated Ankle Eversion with Resistance  - 1 x daily - 7 x weekly - 2 sets - 10 reps - 5 hold  Access Code: [email or web address] URL: [email or web address] Date: 11/14/2022 Prepared by: Precious Bard  Exercises - Seated Heel Toe Raises  - 1 x daily - 7 x weekly - 2 sets - 10 reps - 5 hold - Seated Hip Internal Rotation AROM  - 1 x daily - 7 x weekly - 2 sets - 10 reps - 5 hold - Bilateral Long Arc Quad  - 1 x daily - 7 x weekly - 2 sets - 10 reps - 5 hold - Foam Balance Tandem stance  - 1 x daily - 7 x weekly - 2 sets -  2 reps - 30 hold - Single Leg Balance on Foam  - 1 x daily - 7 x weekly - 2 sets - 30 reps - 2 hold - Seated Hip Flexion March with Ankle Weights  - 1 x daily - 7 x weekly - 2 sets - 10 reps - 5 hold - Standing Hip Abduction with Ankle Weight  - 1 x daily - 7 x weekly - 2 sets - 10 reps - 5 hold - Standing Hip Extension with Ankle Weight  - 1 x daily - 7 x weekly - 2 sets - 10 reps - 5 hold - Standing Hip Flexion with Ankle Weight  - 1 x daily - 7 x weekly - 2 sets - 10 reps - 5 hold - Seated Long Arc Quad with Ankle Weight  - 1 x daily - 7 x weekly - 2 sets - 10 reps - 5 hold Access Code: [email or web address] URL: [email or web address] Date: 08/22/2023 Prepared by: Precious Bard  Exercises - Seated Finger Tip Pinch with Putty  - 1 x daily - 7 x weekly - 2 sets - 10 reps - 5 hold - Hand Squeezes  - 1 x daily - 7 x weekly - 2 sets - 10 reps - 5 hold - Key Pinch with Putty  - 1 x daily - 7 x weekly - 2 sets - 10 reps - 5 hold  GOALS: Goals reviewed with patient? Yes  SHORT TERM GOALS: Target date: 05/09/2023   Patient will be  independent in home exercise program to improve strength/mobility for better functional independence with ADLs. Baseline: 2/21: HEP give next session 5/15 : intermittent compliance 8/22: intermittent compliance due to illness 11/14: compliant  Goal status: MET    LONG TERM GOALS: Target date: 01/16/2024    Patient will increase FOTO score to equal to or greater than  64%   to demonstrate statistically significant improvement in mobility and quality of life.  Baseline: 2/21: 54% 8/22: 52% 10/17: 57% 11/14: 53% 1/16: 47% Goal status: terminated  2.  Patient will increase Berg Balance score by > 6 points (42/56)to demonstrate decreased fall risk during functional activities. Baseline: 2/21: 36/56 5/15: 45   Goal status: MET  3.  Patient will increase six minute walk test distance to >1500 for progression to age norm community ambulator and improve gait ability Baseline: 2/21: 1200 ft with [facility] 5/15: 1300 ft with [facility]  8/22: 905 ft with [facility] 10/17: 1100 ft with [facility] 11/14: 1110 ft with [facility] 1/16: 1210 ft with [facility] 3/6: 1100 ft with two canes Goal status: In progress  4.  Patient will increase BLE gross strength to 4+/5  or within 1 lb of each LE as to improve functional strength for independent gait, increased standing tolerance and increased ADL ability. Baseline: 2/21 :see above 5/15: see above 10/17: RLE 5/5 LLE 4/5 L ankle 3/5  11/14: RLE 5/5 LLE 4+/5 hamstring 4/5, ankle: 3+/5 1/16: RLE 5/5 LLE 4+/5 ankle 4-/5 3/6: see above  Goal status:  In progress / modified  6.  Patient will increase Berg Balance score by > 6 points (51/56) to demonstrate decreased fall risk during functional activities. Baseline:  5/15: 45 8/22: 41/56 10/17: 28 11/14: 36 1/16: 36 3/6: 25 Goal status: Ongoing  7.   Patient will increase UE strength to >4+/5 for ADLs, AD usage, and quality of life.  Baseline: 5/15: see above 8/22: see above 11/14: >4+/5 bilaterally  Goal status: MET  8.   Patient will  increase lower  extremity functional scale to >60/80 to demonstrate improved functional mobility and increased tolerance with ADLs.  Baseline: 11/14: 29%  1/16: 29%  3/6: 19%  Goal status: ongoing   ASSESSMENT:  CLINICAL IMPRESSION: ***  Patient will benefit from skilled physical therapy to improve strength, mobility, and stability for improved quality of life.   OBJECTIVE IMPAIRMENTS: Abnormal gait, decreased activity tolerance, decreased balance, decreased coordination, decreased endurance, decreased mobility, difficulty walking, decreased ROM, decreased strength, hypomobility, impaired flexibility, improper body mechanics, and postural dysfunction.   ACTIVITY LIMITATIONS: carrying, lifting, bending, standing, squatting, stairs, transfers, reach over head, locomotion level, and caring for others  PARTICIPATION LIMITATIONS: meal prep, cleaning, laundry, personal finances, driving, shopping, community activity, and yard work  PERSONAL FACTORS: Age, Past/current experiences, Time since onset of injury/illness/exacerbation, and 3+ comorbidities: DHD, arthritis, concussion COVID, HTN, multiple lacunar infarcts, MVP, pernicious anemia, rocky mountain spotted fever, spinal stenosis, vestibular migraine, vitamin B deficiency.   are also affecting patient's functional outcome.   REHAB POTENTIAL: Good  CLINICAL DECISION MAKING: Evolving/moderate complexity  EVALUATION COMPLEXITY: Moderate  PLAN:   PT FREQUENCY: 1x/week  PT DURATION: 12 weeks  PLANNED INTERVENTIONS: Therapeutic exercises, Therapeutic activity, Neuromuscular re-education, Balance training, Gait training, Patient/Family education, Self Care, Joint mobilization, Stair training, Vestibular training, Canalith repositioning, Visual/preceptual remediation/compensation, Orthotic/Fit training, DME instructions, Cognitive remediation, Electrical stimulation, Spinal mobilization, Cryotherapy, Moist heat, Splintting, Taping, Traction, Ultrasound, Manual  therapy, and Re-evaluation  PLAN FOR NEXT SESSION:  balance, strength, unstable surfaces    Precious Bard, PT, DPT Physical Therapist - [facility]  Outpatient Physical Therapy- Main Campus [phone]

## 2023-10-30 NOTE — Assessment & Plan Note (Signed)
 Chronic.  Difficult to determine whether elevations are due to anxiety vs Hypertension.  Recommend patient check blood pressure at home and bring log to next visit.  Will increase Amlodipine if needed at that time.

## 2023-10-31 ENCOUNTER — Encounter: Payer: Self-pay | Admitting: Nurse Practitioner

## 2023-10-31 ENCOUNTER — Ambulatory Visit: Payer: 59

## 2023-10-31 DIAGNOSIS — R262 Difficulty in walking, not elsewhere classified: Secondary | ICD-10-CM

## 2023-10-31 DIAGNOSIS — F0670 Mild neurocognitive disorder due to known physiological condition without behavioral disturbance: Secondary | ICD-10-CM | POA: Diagnosis not present

## 2023-10-31 DIAGNOSIS — R4681 Obsessive-compulsive behavior: Secondary | ICD-10-CM | POA: Diagnosis not present

## 2023-10-31 DIAGNOSIS — R2681 Unsteadiness on feet: Secondary | ICD-10-CM

## 2023-10-31 DIAGNOSIS — F840 Autistic disorder: Secondary | ICD-10-CM | POA: Diagnosis not present

## 2023-10-31 DIAGNOSIS — M6281 Muscle weakness (generalized): Secondary | ICD-10-CM | POA: Diagnosis not present

## 2023-10-31 LAB — COMPREHENSIVE METABOLIC PANEL
ALT: 10 IU/L (ref 0–44)
AST: 19 IU/L (ref 0–40)
Albumin: 4.5 g/dL (ref 3.9–4.9)
Alkaline Phosphatase: 89 IU/L (ref 44–121)
BUN/Creatinine Ratio: 16 (ref 10–24)
BUN: 13 mg/dL (ref 8–27)
Bilirubin Total: 0.6 mg/dL (ref 0.0–1.2)
CO2: 23 mmol/L (ref 20–29)
Calcium: 9.6 mg/dL (ref 8.6–10.2)
Chloride: 100 mmol/L (ref 96–106)
Creatinine, Ser: 0.83 mg/dL (ref 0.76–1.27)
Globulin, Total: 2.5 g/dL (ref 1.5–4.5)
Glucose: 91 mg/dL (ref 70–99)
Potassium: 4.6 mmol/L (ref 3.5–5.2)
Sodium: 139 mmol/L (ref 134–144)
Total Protein: 7 g/dL (ref 6.0–8.5)
eGFR: 98 mL/min/{1.73_m2} (ref >59)

## 2023-10-31 LAB — VITAMIN D 25 HYDROXY (VIT D DEFICIENCY, FRACTURES): Vit D, 25-Hydroxy: 71.1 ng/mL (ref 30.0–100.0)

## 2023-10-31 LAB — VITAMIN B12: Vitamin B-12: 1344 pg/mL — ABNORMAL HIGH (ref 232–1245)

## 2023-11-06 NOTE — Therapy (Signed)
 OUTPATIENT PHYSICAL THERAPY NEURO TREATMENT     Patient Name: [name] MRN: [id number] DOB:[birth year], 63 y.o., male Today's Date: 11/07/2023   PCP: Larae Grooms NP REFERRING PROVIDER: Susanne Borders PA  END OF SESSION:  PT End of Session - 11/07/23 1305     Visit Number 33    Number of Visits 43    Date for PT Re-Evaluation 01/16/24    Authorization Type eval 2/21    PT Start Time 1405    PT Stop Time 1444    PT Time Calculation (min) 39 min    Equipment Utilized During Treatment Gait belt    Activity Tolerance Patient tolerated treatment well    Behavior During Therapy [facility] for tasks assessed/performed                                          Past Medical History:  Diagnosis Date   ADHD    Arthritis    Concussion    COVID 12/2020   Hypertension    Multiple lacunar infarcts (HCC)    MVP (mitral valve prolapse)    Pernicious anemia    [facility] spotted fever    Spinal stenosis    Vestibular migraine    Vitamin B12 deficiency    Vitamin D deficiency    Past Surgical History:  Procedure Laterality Date   ANTERIOR CERVICAL DECOMP/DISCECTOMY FUSION N/A 07/30/2022   Procedure: C4-7 ANTERIOR CERVICAL DISCECTOMY AND FUSION (GLOBUS HEDRON);  Surgeon: Venetia Night, MD;  Location: [facility] ORS;  Service: Neurosurgery;  Laterality: N/A;   BILATERAL CARPAL TUNNEL RELEASE     CERVICAL WOUND DEBRIDEMENT N/A 08/01/2022   Procedure: CERVICAL WOUND DEBRIDEMENT OF HEMATOMA;  Surgeon: Venetia Night, MD;  Location: [facility] ORS;  Service: Neurosurgery;  Laterality: N/A;   FRACTURE SURGERY Left 1984   intramedullary rod   hip bone transplant  1984   LEG SURGERY Left    oseotomy   REPAIR ANKLE LIGAMENT Right 1978   REPAIR KNEE LIGAMENT Bilateral    acl   TRIGGER FINGER RELEASE Bilateral    thumbs   Patient Active Problem List   Diagnosis Date Noted   Dizziness 10/30/2023   Postoperative hematoma of musculoskeletal  structure following musculoskeletal procedure 08/01/2022   Cervical myelopathy (HCC) 07/30/2022   Cervical spinal stenosis 07/30/2022   Radiculopathy, cervical 07/30/2022   S/P cervical spinal fusion 07/30/2022   Vitamin D deficiency 06/25/2022   B12 deficiency 06/25/2022   Elevated LDL cholesterol level 06/25/2022   Prediabetes 03/27/2021   History of nausea- reported 01/27/2020 02/02/2020   Accidental fall from ladder 02/02/2020   Acute right-sided thoracic back pain 02/02/2020   Neck stiffness 02/02/2020   Concussion with no loss of consciousness 02/02/2020   Multiple falls 02/02/2020   Head trauma, initial encounter 02/02/2020   Acute post-traumatic headache, not intractable 02/02/2020   Essential hypertension 08/26/2018   Pre-bariatric surgery nutrition evaluation 06/19/2018   Migraine with vertigo 01/05/2014   Arthralgia of multiple joints 04/28/2009   Carpal tunnel syndrome 08/30/2008    ONSET DATE: summer 2022  REFERRING DIAG: LE weakness and gait instability   THERAPY DIAG:  Muscle weakness (generalized)  Unsteadiness on feet  Difficulty in walking, not elsewhere classified  Rationale for Evaluation and Treatment: Rehabilitation  SUBJECTIVE:  SUBJECTIVE STATEMENT: Patient had OT eval today prior to PT session. Patient fell prior to session in front of hospital coming off scooter.   Pt accompanied by: self  PERTINENT HISTORY:  Patient presents for LE weakness and gait instability. He is s/p ACDF C-7 on 07/30/22. Patient has a leg length discrepancy and limited L knee flexion since his 20's. Patient has a lot of atrophy and lower back pain, foot drop bilaterally.  First symptoms was stumbling up stairs, with LLE. Foot drop was improved after ACDF. Now has numbness and tingling at the  end of the day. Does have a few lesions in his pons per wife, on blood pressure medication now. PMH includes ADHD, arthritis, concussion COVID, HTN, multiple lacunar infarcts, MVP, pernicious anemia, rocky mountain spotted fever, spinal stenosis, vestibular migraine, vitamin B deficiency.   PAIN:  Are you having pain?  Denies pain   PRECAUTIONS: Other: cervical; no lifting more than 20 lb   WEIGHT BEARING RESTRICTIONS: No  FALLS: Has patient fallen in last 6 months? Yes. Number of falls multiple a week  LIVING ENVIRONMENT: Lives with: lives with their family Lives in: House/apartment Stairs: Yes: Internal: flight steps; is sleeping downstairs Has following equipment at home: Single point cane  PLOF: Independent, uses a walking stick  PATIENT GOALS: gait control of feet, strengthen legs, balance, atrophy in hand  OBJECTIVE:   DIAGNOSTIC FINDINGS:  09/11/22: No acute fracture. No spondylolisthesis. No compression deformities. Disc space narrowing C3-4 with marginal osteophytes consistent with degenerative disc disease. Postop changes ACDF C4-C7 with anatomic alignment. Normal prevertebral and cervicocranial soft tissues.   06/19/22: 1. L5 chronic pars defects with L5-S1 anterolisthesis and advanced disc degeneration causing marked biforaminal L5 impingement. 2. Degeneration and short pedicles causes compressive spinal stenosis from L1-2 to L3-4, most advanced at L2-3. Moderate bilateral foraminal narrowing at the same levels.   COGNITION: Overall cognitive status: Within functional limits for tasks assessed    COORDINATION: Limited LLE  MUSCLE LENGTH: Significant limitation L hamstring; 60 degree flexion deficit.   POSTURE:  weight shift to the R  LOWER EXTREMITY ROM:     L knee flexion contraction -60 contraction LOWER EXTREMITY MMT:    MMT Right Eval R 5/15 R 8/22 Left Eval L 5/15 L 8/22  Hip flexion 4 5 5 4  4+ 4  Hip extension        Hip abduction 4- 4 4 4-  4 4  Hip adduction 4- 4 4 4- 4- 4  Hip internal rotation        Hip external rotation        Knee flexion 4- 4 4 3  4- 4-  Knee extension 4 4+ 5 4 4  4-  Ankle dorsiflexion 3 3 3+ 2+ 2+ 2+  Ankle plantarflexion 3 3+ 4- 3 3+ 3+  Ankle inversion        Ankle eversion        (Blank rows = not tested)  01/02/23 Right 8/22: RECERT  10/17 PN Left 8/22: Recert LUE 10/17: PN   Shoulder Flexion 4 4+ 5 4- 4+ 4+  Shoulder Abduction 4- 4 4+ 4- 4 4+  Shoulder Extension 4- 4+ 4 4- 4   Shoulder adduction 4- 4  3+ 4 4+  Bicep  4 4+ 5 4 4+ 4  Triceps 4- 4- 5 3+ 4 4     Right Left  Hip flexion 12.2 10.2  Hip Abduction 7.4 7.0  Hip Adduction 10.8 10.5  Knee Extension  13.8 13.4  Knee Flexion 13.4 13.2  DF    PF       BED MOBILITY:  Perform next session  TRANSFERS: Assistive device utilized: Single point cane  Sit to stand: CGA Stand to sit: CGA Chair to chair: CGA   GAIT: Gait pattern: L knee limited extension, L foot drop, L foot eversion Distance walked: 1200 Assistive device utilized: Single point cane Level of assistance: CGA Comments: Patient has increased eversion and rolling of L foot, LLE leg length discrepancy.   FUNCTIONAL TESTS:  5 times sit to stand: 5.6 seconds with minor use of LLE no UE support Timed up and go (TUG): n/a 6 minute walk test: 1200 ft 10 meter walk test: 7 seconds BERG: 36/56   PATIENT SURVEYS:  FOTO 54  TODAY'S TREATMENT:                                                                                                                              DATE: 11/07/23 Unless otherwise stated, CGA was provided and gait belt donned in order to ensure pt safety     TherEx: Seated: 3 sets  LAQ 10x with 3lb aw Overhead weighted bar press 10x March 10x with 3lb aw Chest press weighted bar press 10x 5lb bicep curl 15x   Dyndisc: -df/pf 30x -eversion 20x  -clockwise 20x  Neuro re-ed.  Standing with CGA next to support surface:  Airex pad: static  stand 30 seconds x 2 trials, noticeable trembling of ankles/LE's with fatigue and challenge to maintain stability Airex pad: horizontal head turns 30 seconds scanning room 10x ; cueing for arc of motion  Airex pad: vertical head turns 30 seconds, cueing for arc of motion, noticeable sway with upward gaze increasing demand on ankle righting reaction musculature Airex pad: march with cues for foot placement x 12 minutes Airex pad: tap hedgehog 15x each LE ; initially with finger tips then progressed to no hands; very challenging.       PATIENT EDUCATION: Education details: goals, POC, educated on safety during updated exercises  Person educated: Patient and Spouse Education method: Explanation, Demonstration, Tactile cues, and Verbal cues Education comprehension: verbalized understanding, returned demonstration, verbal cues required, and tactile cues required  HOME EXERCISE PROGRAM: Access Code: [email or web address] URL: [email or web address] Date: 10/11/2022 Prepared by: Precious Bard  Exercises - Seated Ankle Alphabet  - 1 x daily - 7 x weekly - 2 sets - 10 reps - 5 hold - Seated Heel Toe Raises  - 1 x daily - 7 x weekly - 2 sets - 10 reps - 5 hold - Standing March with Counter Support  - 1 x daily - 7 x weekly - 2 sets - 10 reps - 5 hold  Access Code: [email or web address] URL: [email or web address] Date: 10/30/2022 Prepared by: Precious Bard  Exercises - Seated Ankle Plantarflexion with Resistance  - 1 x daily - 7 x weekly - 2 sets - 10 reps - 5 hold -  Seated Ankle Eversion with Resistance  - 1 x daily - 7 x weekly - 2 sets - 10 reps - 5 hold  Access Code: [email or web address] URL: [email or web address] Date: 11/14/2022 Prepared by: Precious Bard  Exercises - Seated Heel Toe Raises  - 1 x daily - 7 x weekly - 2 sets - 10 reps - 5 hold - Seated Hip Internal Rotation AROM  - 1 x daily - 7 x weekly - 2 sets - 10 reps - 5 hold - Bilateral Long Arc Quad  - 1 x daily - 7 x  weekly - 2 sets - 10 reps - 5 hold - Foam Balance Tandem stance  - 1 x daily - 7 x weekly - 2 sets - 2 reps - 30 hold - Single Leg Balance on Foam  - 1 x daily - 7 x weekly - 2 sets - 30 reps - 2 hold - Seated Hip Flexion March with Ankle Weights  - 1 x daily - 7 x weekly - 2 sets - 10 reps - 5 hold - Standing Hip Abduction with Ankle Weight  - 1 x daily - 7 x weekly - 2 sets - 10 reps - 5 hold - Standing Hip Extension with Ankle Weight  - 1 x daily - 7 x weekly - 2 sets - 10 reps - 5 hold - Standing Hip Flexion with Ankle Weight  - 1 x daily - 7 x weekly - 2 sets - 10 reps - 5 hold - Seated Long Arc Quad with Ankle Weight  - 1 x daily - 7 x weekly - 2 sets - 10 reps - 5 hold Access Code: [email or web address] URL: [email or web address] Date: 08/22/2023 Prepared by: Precious Bard  Exercises - Seated Finger Tip Pinch with Putty  - 1 x daily - 7 x weekly - 2 sets - 10 reps - 5 hold - Hand Squeezes  - 1 x daily - 7 x weekly - 2 sets - 10 reps - 5 hold - Key Pinch with Putty  - 1 x daily - 7 x weekly - 2 sets - 10 reps - 5 hold  GOALS: Goals reviewed with patient? Yes  SHORT TERM GOALS: Target date: 05/09/2023   Patient will be independent in home exercise program to improve strength/mobility for better functional independence with ADLs. Baseline: 2/21: HEP give next session 5/15 : intermittent compliance 8/22: intermittent compliance due to illness 11/14: compliant  Goal status: MET    LONG TERM GOALS: Target date: 01/16/2024    Patient will increase FOTO score to equal to or greater than  64%   to demonstrate statistically significant improvement in mobility and quality of life.  Baseline: 2/21: 54% 8/22: 52% 10/17: 57% 11/14: 53% 1/16: 47% Goal status: terminated  2.  Patient will increase Berg Balance score by > 6 points (42/56)to demonstrate decreased fall risk during functional activities. Baseline: 2/21: 36/56 5/15: 45   Goal status: MET  3.  Patient will increase six  minute walk test distance to >1500 for progression to age norm community ambulator and improve gait ability Baseline: 2/21: 1200 ft with [facility] 5/15: 1300 ft with [facility]  8/22: 905 ft with [facility] 10/17: 1100 ft with [facility] 11/14: 1110 ft with [facility] 1/16: 1210 ft with [facility] 3/6: 1100 ft with two canes Goal status: In progress  4.  Patient will increase BLE gross strength to 4+/5  or within 1 lb of each LE as to improve functional strength for independent gait, increased standing tolerance  and increased ADL ability. Baseline: 2/21 :see above 5/15: see above 10/17: RLE 5/5 LLE 4/5 L ankle 3/5  11/14: RLE 5/5 LLE 4+/5 hamstring 4/5, ankle: 3+/5 1/16: RLE 5/5 LLE 4+/5 ankle 4-/5 3/6: see above  Goal status:  In progress / modified  6.  Patient will increase Berg Balance score by > 6 points (51/56) to demonstrate decreased fall risk during functional activities. Baseline:  5/15: 45 8/22: 41/56 10/17: 28 11/14: 36 1/16: 36 3/6: 25 Goal status: Ongoing  7.   Patient will increase UE strength to >4+/5 for ADLs, AD usage, and quality of life.  Baseline: 5/15: see above 8/22: see above 11/14: >4+/5 bilaterally  Goal status: MET  8.   Patient will increase lower extremity functional scale to >60/80 to demonstrate improved functional mobility and increased tolerance with ADLs.  Baseline: 11/14: 29%  1/16: 29%  3/6: 19%  Goal status: ongoing   ASSESSMENT:  CLINICAL IMPRESSION: Patient is unstable on airex pad with frequent posterior LOB. Patient is highly motivated throughout session. L ankle eversion occurred with weight acceptance.  Patient will benefit from skilled physical therapy to improve strength, mobility, and stability for improved quality of life.   OBJECTIVE IMPAIRMENTS: Abnormal gait, decreased activity tolerance, decreased balance, decreased coordination, decreased endurance, decreased mobility, difficulty walking, decreased ROM, decreased strength, hypomobility, impaired flexibility, improper body  mechanics, and postural dysfunction.   ACTIVITY LIMITATIONS: carrying, lifting, bending, standing, squatting, stairs, transfers, reach over head, locomotion level, and caring for others  PARTICIPATION LIMITATIONS: meal prep, cleaning, laundry, personal finances, driving, shopping, community activity, and yard work  PERSONAL FACTORS: Age, Past/current experiences, Time since onset of injury/illness/exacerbation, and 3+ comorbidities: DHD, arthritis, concussion COVID, HTN, multiple lacunar infarcts, MVP, pernicious anemia, rocky mountain spotted fever, spinal stenosis, vestibular migraine, vitamin B deficiency.   are also affecting patient's functional outcome.   REHAB POTENTIAL: Good  CLINICAL DECISION MAKING: Evolving/moderate complexity  EVALUATION COMPLEXITY: Moderate  PLAN:   PT FREQUENCY: 1x/week  PT DURATION: 12 weeks  PLANNED INTERVENTIONS: Therapeutic exercises, Therapeutic activity, Neuromuscular re-education, Balance training, Gait training, Patient/Family education, Self Care, Joint mobilization, Stair training, Vestibular training, Canalith repositioning, Visual/preceptual remediation/compensation, Orthotic/Fit training, DME instructions, Cognitive remediation, Electrical stimulation, Spinal mobilization, Cryotherapy, Moist heat, Splintting, Taping, Traction, Ultrasound, Manual therapy, and Re-evaluation  PLAN FOR NEXT SESSION:  balance, strength, unstable surfaces    Precious Bard, PT, DPT Physical Therapist - [place] [facility]  Outpatient Physical Therapy- Main Campus [phone]

## 2023-11-07 ENCOUNTER — Ambulatory Visit: Payer: 59

## 2023-11-07 ENCOUNTER — Ambulatory Visit: Admitting: Occupational Therapy

## 2023-11-07 DIAGNOSIS — F0670 Mild neurocognitive disorder due to known physiological condition without behavioral disturbance: Secondary | ICD-10-CM | POA: Diagnosis not present

## 2023-11-07 DIAGNOSIS — M6281 Muscle weakness (generalized): Secondary | ICD-10-CM

## 2023-11-07 DIAGNOSIS — R262 Difficulty in walking, not elsewhere classified: Secondary | ICD-10-CM

## 2023-11-07 DIAGNOSIS — R2681 Unsteadiness on feet: Secondary | ICD-10-CM

## 2023-11-07 DIAGNOSIS — R4681 Obsessive-compulsive behavior: Secondary | ICD-10-CM | POA: Diagnosis not present

## 2023-11-07 DIAGNOSIS — F840 Autistic disorder: Secondary | ICD-10-CM | POA: Diagnosis not present

## 2023-11-07 NOTE — Therapy (Signed)
 OUTPATIENT OCCUPATIONAL THERAPY NEURO EVALUATION  Patient Name: [name] MRN: [id number] DOB:[birth year], 63 y.o., male Today's Date: 11/07/2023  PCP: Larae Grooms, NP REFERRING PROVIDER: Lonell Face, MD  END OF SESSION:  OT End of Session - 11/07/23 1410     Visit Number 1    Number of Visits 24    Date for OT Re-Evaluation 01/30/24    OT Start Time 1315    OT Stop Time 1400    OT Time Calculation (min) 45 min    Activity Tolerance Patient tolerated treatment well    Behavior During Therapy [facility] for tasks assessed/performed             Past Medical History:  Diagnosis Date   ADHD    Arthritis    Concussion    COVID 12/2020   Hypertension    Multiple lacunar infarcts (HCC)    MVP (mitral valve prolapse)    Pernicious anemia    [facility] spotted fever    Spinal stenosis    Vestibular migraine    Vitamin B12 deficiency    Vitamin D deficiency    Past Surgical History:  Procedure Laterality Date   ANTERIOR CERVICAL DECOMP/DISCECTOMY FUSION N/A 07/30/2022   Procedure: C4-7 ANTERIOR CERVICAL DISCECTOMY AND FUSION (GLOBUS HEDRON);  Surgeon: Venetia Night, MD;  Location: [facility] ORS;  Service: Neurosurgery;  Laterality: N/A;   BILATERAL CARPAL TUNNEL RELEASE     CERVICAL WOUND DEBRIDEMENT N/A 08/01/2022   Procedure: CERVICAL WOUND DEBRIDEMENT OF HEMATOMA;  Surgeon: Venetia Night, MD;  Location: [facility] ORS;  Service: Neurosurgery;  Laterality: N/A;   FRACTURE SURGERY Left 1984   intramedullary rod   hip bone transplant  1984   LEG SURGERY Left    oseotomy   REPAIR ANKLE LIGAMENT Right 1978   REPAIR KNEE LIGAMENT Bilateral    acl   TRIGGER FINGER RELEASE Bilateral    thumbs   Patient Active Problem List   Diagnosis Date Noted   Dizziness 10/30/2023   Postoperative hematoma of musculoskeletal structure following musculoskeletal procedure 08/01/2022   Cervical myelopathy (HCC) 07/30/2022   Cervical spinal stenosis 07/30/2022    Radiculopathy, cervical 07/30/2022   S/P cervical spinal fusion 07/30/2022   Vitamin D deficiency 06/25/2022   B12 deficiency 06/25/2022   Elevated LDL cholesterol level 06/25/2022   Prediabetes 03/27/2021   History of nausea- reported 01/27/2020 02/02/2020   Accidental fall from ladder 02/02/2020   Acute right-sided thoracic back pain 02/02/2020   Neck stiffness 02/02/2020   Concussion with no loss of consciousness 02/02/2020   Multiple falls 02/02/2020   Head trauma, initial encounter 02/02/2020   Acute post-traumatic headache, not intractable 02/02/2020   Essential hypertension 08/26/2018   Pre-bariatric surgery nutrition evaluation 06/19/2018   Migraine with vertigo 01/05/2014   Arthralgia of multiple joints 04/28/2009   Carpal tunnel syndrome 08/30/2008    ONSET DATE: 07/30/2022  REFERRING DIAG: ACDF, Hx of CVAs  THERAPY DIAG:  Muscle weakness (generalized)  Rationale for Evaluation and Treatment: Rehabilitation  SUBJECTIVE:   SUBJECTIVE STATEMENT: Pt. Has PT treatment following the OT evaluation this afternoon. Pt accompanied by: self  PERTINENT HISTORY: Pt. underwent an ACDF C4-C7. L5-S1 anterolithesis, and disc degeneration causing marked biforminal L5 impingement, Spinal stenosis from L1-2 to L3-4 with L2-3 being the most advanced. Bilateral hand tremors. Bilateral Lacunar infarcts in 2020. See above list for PMHx .  PRECAUTIONS: None  WEIGHT BEARING RESTRICTIONS: No  PAIN:  Are you having pain? 3/10 standing, walking in from the  front entrance  FALLS: Has patient fallen in last 6 months? Yes. Number of falls 6-10 falls  LIVING ENVIRONMENT: Lives with: lives with their spouse Lives in: House/apartment 2 levels Stairs: 4-5 steps. Has following equipment at home: Single point cane, Walker - 2 wheeled, and shower chair  PLOF: Independent  PATIENT GOALS:  To improve bilateral hand cooridnation  OBJECTIVE:  Note: Objective measures were completed at  Evaluation unless otherwise noted.  HAND DOMINANCE: Right hand dominant  ADLs:  Transfers/ambulation related to ADLs: Eating: feels like hand, and mouth are not working together, drinks drip out of mouth, Difficulty using fork, and knife together for self-feeding. Grooming: Independent brushing teeth UB Dressing: Difficulty buttoning LB Dressing: Difficulty managing pants, shoes, and socks Toileting: Independent Bathing: incoordination with applying shampoo with bilateral hands Tub Shower transfers: Distant supervision   IADLs: Shopping: Delivery Light housekeeping: Difficulty with vacuuming-bending over Meal Prep:  Independent Community mobility:  Independent Medication management: Wife sets up pillbox-Pt. Takes pills independently  Financial management: No changes Handwriting: 75% legible  MOBILITY STATUS: Hx of falls  POSTURE COMMENTS:   Sitting balance: good  ACTIVITY TOLERANCE: Activity tolerance: Fair  FUNCTIONAL OUTCOME MEASURES: TBD  UPPER EXTREMITY ROM:    Active ROM Right Eval WFL Left Eval [facility]  Shoulder flexion    Shoulder abduction    Shoulder adduction    Shoulder extension    Shoulder internal rotation    Shoulder external rotation    Elbow flexion    Elbow extension    Wrist flexion    Wrist extension    Wrist ulnar deviation    Wrist radial deviation    Wrist pronation    Wrist supination    (Blank rows = not tested)  UPPER EXTREMITY MMT:     MMT Right Eval  Left Eval   Shoulder flexion 4+/5 4+/5  Shoulder abduction 4+/5 4+/5  Shoulder adduction    Shoulder extension    Shoulder internal rotation    Shoulder external rotation    Middle trapezius    Lower trapezius    Elbow flexion 5/5 5/5  Elbow extension 5/5 5/5  Wrist flexion    Wrist extension 5/5 5/5  Wrist ulnar deviation    Wrist radial deviation    Wrist pronation    Wrist supination    (Blank rows = not tested)  HAND FUNCTION: Grip strength: Right: 48 lbs;  Left: 44 lbs, Lateral pinch: Right: 17 lbs, Left: 16 lbs, and 3 point pinch: Right: 12 lbs, Left: 19 lbs  COORDINATION: 9 Hole Peg test: Right: 25 sec; Left: 26 sec  SENSATION: TBD. Pt. reports no changes  EDEMA: N/A  MUSCLE TONE: Intact;  Atrophy at the right dorsal interossei muscles.  COGNITION: Overall cognitive status: WFL for tasks performed  VISION:  Wears glasses. No change from baseline   PERCEPTION: intact   OBSERVATIONS: Pt. Left his 2 wheeled scooter in the waiting/reception area  TREATMENT DATE:   Initial evaluation was completed, and education was provided as indicated below  PATIENT EDUCATION: Education details: OT services, POC, goals, ADL/IADL functional status. Person educated: Patient Education method: Medical illustrator Education comprehension: verbalized understanding and returned demonstration  HOME EXERCISE PROGRAM: To be assessed, and provided as indicated   GOALS: Goals reviewed with patient? Yes  SHORT TERM GOALS: Target date: 12/19/2023  Pt. Will be independent with HEPs for BUEs  Baseline: Eval: No current HEP Goal status: INITIAL  LONG TERM GOALS: Target date: 01/30/2024  Pt. Will perform buttoning with modified independence Baseline: Eval: Buttoning is difficult Goal status: INITIAL  2.  Pt. Will be able to cut food with Modified independence Baseline: Eval: Pt. Has difficulty using bilateral hand ts cut food. Goal status: INITIAL  3.  Pt. Will improve bilateral UE strength by 2 mm grades to be able to sustain bilateral UEs in elevation while coordinating bilateral hands for shampooing. Baseline: Eval: Pt. Has difficulty coordinating UEs during shampooing. Goal status: INITIAL  4.  Pt. Will demonstrate compensatory/adaptive equipment strategies for LE dressing tasks.  Baseline: Eval:Pt. Has  difficulty performing LE dressing   Goal status: INITIAL  5.  Pt. Will identify 2 compensatory strategies for bilateral tremors during ADLs/IADLs Baseline: Eval: Education to be provided Goal status: INITIAL  ASSESSMENT:  CLINICAL IMPRESSION:  Patient is a 63 y.o. male who was referred to OT services for bilateral hand tremors. Pt. presents with decreased bilateral UE strength, and bilateral hand coordination skills which limit his ability to use his bilateral hands to efficiently shampoo and lather his hair, complete buttoning, and cut food using a fork, and knife. Pt. will benefit from OT services to work on improving BUE functioning, and maximize independence with ADLs, and IADL tasks.   PERFORMANCE DEFICITS: in functional skills including ADLs, IADLs, coordination, dexterity, ROM, strength, pain, Fine motor control, Gross motor control, and UE functional use, cognitive skills including , and psychosocial skills including coping strategies, environmental adaptation, and routines and behaviors.   IMPAIRMENTS: are limiting patient from ADLs, IADLs, and leisure.   CO-MORBIDITIES: may have co-morbidities  that affects occupational performance. Patient will benefit from skilled OT to address above impairments and improve overall function.  MODIFICATION OR ASSISTANCE TO COMPLETE EVALUATION: Min-Moderate modification of tasks or assist with assess necessary to complete an evaluation.  OT OCCUPATIONAL PROFILE AND HISTORY: Detailed assessment: Review of records and additional review of physical, cognitive, psychosocial history related to current functional performance.  CLINICAL DECISION MAKING: Moderate - several treatment options, min-mod task modification necessary  REHAB POTENTIAL: Good  EVALUATION COMPLEXITY: Moderate    PLAN:  OT FREQUENCY: 2x/month  OT DURATION: 12 weeks  PLANNED INTERVENTIONS: 97535 self care/ADL training, 40981 therapeutic exercise, 97530 therapeutic activity,  97112 neuromuscular re-education, 97140 manual therapy, 97018 paraffin, 19147 moist heat, 97034 contrast bath, energy conservation, patient/family education, and DME and/or AE instructions  RECOMMENDED OTHER SERVICES: PT  CONSULTED AND AGREED WITH PLAN OF CARE: Patient  PLAN FOR NEXT SESSION: Treatment   Olegario Messier, MS, OTR/L 11/07/2023, 3:42 PM

## 2023-11-11 ENCOUNTER — Ambulatory Visit: Admitting: Occupational Therapy

## 2023-11-11 ENCOUNTER — Ambulatory Visit: Payer: 59

## 2023-11-11 NOTE — Progress Notes (Deleted)
   There were no vitals taken for this visit.   Subjective:    Patient ID: [name], male    DOB: 10/07/1960, 63 y.o.   MRN: [id number]  HPI: [name] is a 63 y.o. male  No chief complaint on file.  HYPERTENSION with Chronic Kidney Disease Dr. Cherylann Ratel changed Lisinopril to Amlodipine.  The lisinopril seemed to be contributing to his weight loss.  Hypertension status: controlled Satisfied with current treatment? no Duration of hypertension: years BP monitoring frequency:  not checking BP range:  BP medication side effects:  no Medication compliance: excellent compliance Previous BP meds:none Aspirin: no Recurrent headaches: no Visual changes: no Palpitations: no Dyspnea: no Chest pain: no Lower extremity edema: no Dizzy/lightheaded: no   Relevant past medical, surgical, family and social history reviewed and updated as indicated. Interim medical history since our last visit reviewed. Allergies and medications reviewed and updated.  Review of Systems  Per HPI unless specifically indicated above     Objective:    There were no vitals taken for this visit.  Wt Readings from Last 3 Encounters:  10/30/23 144 lb 3.2 oz (65.4 kg)  10/29/23 146 lb (66.2 kg)  07/11/23 141 lb 3.2 oz (64 kg)    Physical Exam  Results for orders placed or performed in visit on 10/30/23  Comp Met (CMET)   Collection Time: 10/30/23 11:42 AM  Result Value Ref Range   Glucose 91 70 - 99 mg/dL   BUN 13 8 - 27 mg/dL   Creatinine, Ser 4.09 0.76 - 1.27 mg/dL   eGFR 98 >81 XB/JYN/8.29   BUN/Creatinine Ratio 16 10 - 24   Sodium 139 134 - 144 mmol/L   Potassium 4.6 3.5 - 5.2 mmol/L   Chloride 100 96 - 106 mmol/L   CO2 23 20 - 29 mmol/L   Calcium 9.6 8.6 - 10.2 mg/dL   Total Protein 7.0 6.0 - 8.5 g/dL   Albumin 4.5 3.9 - 4.9 g/dL   Globulin, Total 2.5 1.5 - 4.5 g/dL   Bilirubin Total 0.6 0.0 - 1.2 mg/dL   Alkaline Phosphatase 89 44 - 121 IU/L   AST 19 0 - 40 IU/L   ALT 10  0 - 44 IU/L  VITAMIN D 25 Hydroxy (Vit-D Deficiency, Fractures)   Collection Time: 10/30/23 11:42 AM  Result Value Ref Range   Vit D, 25-Hydroxy 71.1 30.0 - 100.0 ng/mL  Vitamin B12   Collection Time: 10/30/23 11:42 AM  Result Value Ref Range   Vitamin B-12 1,344 (H) 232 - 1,245 pg/mL      Assessment & Plan:   Problem List Items Addressed This Visit   None    Follow up plan: No follow-ups on file.

## 2023-11-12 ENCOUNTER — Ambulatory Visit: Admitting: Nurse Practitioner

## 2023-11-14 ENCOUNTER — Ambulatory Visit: Admitting: Occupational Therapy

## 2023-11-14 DIAGNOSIS — R262 Difficulty in walking, not elsewhere classified: Secondary | ICD-10-CM | POA: Diagnosis not present

## 2023-11-14 DIAGNOSIS — R4681 Obsessive-compulsive behavior: Secondary | ICD-10-CM | POA: Diagnosis not present

## 2023-11-14 DIAGNOSIS — F0670 Mild neurocognitive disorder due to known physiological condition without behavioral disturbance: Secondary | ICD-10-CM | POA: Diagnosis not present

## 2023-11-14 DIAGNOSIS — M6281 Muscle weakness (generalized): Secondary | ICD-10-CM | POA: Diagnosis not present

## 2023-11-14 DIAGNOSIS — R2681 Unsteadiness on feet: Secondary | ICD-10-CM | POA: Diagnosis not present

## 2023-11-14 DIAGNOSIS — F840 Autistic disorder: Secondary | ICD-10-CM | POA: Diagnosis not present

## 2023-11-14 NOTE — Therapy (Signed)
 OUTPATIENT OCCUPATIONAL THERAPY NEURO TREATMENT NOTE  Patient Name: [name] MRN: [id number] DOB:14-Nov-1960, 63 y.o., male Today's Date: 11/14/2023  PCP: Larae Grooms, NP REFERRING PROVIDER: Lonell Face, MD  END OF SESSION:  OT End of Session - 11/14/23 2157     Visit Number 2    Number of Visits 24    Date for OT Re-Evaluation 01/30/24    OT Start Time 1320    OT Stop Time 1400    OT Time Calculation (min) 40 min    Activity Tolerance Patient tolerated treatment well    Behavior During Therapy [facility] for tasks assessed/performed             Past Medical History:  Diagnosis Date   ADHD    Arthritis    Concussion    COVID 12/2020   Hypertension    Multiple lacunar infarcts (HCC)    MVP (mitral valve prolapse)    Pernicious anemia    [facility] spotted fever    Spinal stenosis    Vestibular migraine    Vitamin B12 deficiency    Vitamin D deficiency    Past Surgical History:  Procedure Laterality Date   ANTERIOR CERVICAL DECOMP/DISCECTOMY FUSION N/A 07/30/2022   Procedure: C4-7 ANTERIOR CERVICAL DISCECTOMY AND FUSION (GLOBUS HEDRON);  Surgeon: Venetia Night, MD;  Location: [facility] ORS;  Service: Neurosurgery;  Laterality: N/A;   BILATERAL CARPAL TUNNEL RELEASE     CERVICAL WOUND DEBRIDEMENT N/A 08/01/2022   Procedure: CERVICAL WOUND DEBRIDEMENT OF HEMATOMA;  Surgeon: Venetia Night, MD;  Location: [facility] ORS;  Service: Neurosurgery;  Laterality: N/A;   FRACTURE SURGERY Left 1984   intramedullary rod   hip bone transplant  1984   LEG SURGERY Left    oseotomy   REPAIR ANKLE LIGAMENT Right 1978   REPAIR KNEE LIGAMENT Bilateral    acl   TRIGGER FINGER RELEASE Bilateral    thumbs   Patient Active Problem List   Diagnosis Date Noted   Dizziness 10/30/2023   Postoperative hematoma of musculoskeletal structure following musculoskeletal procedure 08/01/2022   Cervical myelopathy (HCC) 07/30/2022   Cervical spinal stenosis 07/30/2022    Radiculopathy, cervical 07/30/2022   S/P cervical spinal fusion 07/30/2022   Vitamin D deficiency 06/25/2022   B12 deficiency 06/25/2022   Elevated LDL cholesterol level 06/25/2022   Prediabetes 03/27/2021   History of nausea- reported 01/27/2020 02/02/2020   Accidental fall from ladder 02/02/2020   Acute right-sided thoracic back pain 02/02/2020   Neck stiffness 02/02/2020   Concussion with no loss of consciousness 02/02/2020   Multiple falls 02/02/2020   Head trauma, initial encounter 02/02/2020   Acute post-traumatic headache, not intractable 02/02/2020   Essential hypertension 08/26/2018   Pre-bariatric surgery nutrition evaluation 06/19/2018   Migraine with vertigo 01/05/2014   Arthralgia of multiple joints 04/28/2009   Carpal tunnel syndrome 08/30/2008    ONSET DATE: 07/30/2022  REFERRING DIAG: ACDF, Hx of CVAs  THERAPY DIAG:  Muscle weakness (generalized)  Rationale for Evaluation and Treatment: Rehabilitation  SUBJECTIVE:   SUBJECTIVE STATEMENT: Pt. Uses a manual scooter to help his knee when navigating longer distances.   Pt accompanied by: self  PERTINENT HISTORY: Pt. underwent an ACDF C4-C7. L5-S1 anterolithesis, and disc degeneration causing marked biforminal L5 impingement, Spinal stenosis from L1-2 to L3-4 with L2-3 being the most advanced. Bilateral hand tremors. Bilateral Lacunar infarcts in 2020. See above list for PMHx .  PRECAUTIONS: None  WEIGHT BEARING RESTRICTIONS: No  PAIN:  Are you having pain?  None reported  FALLS: Has patient fallen in last 6 months? Yes. Number of falls 6-10 falls  LIVING ENVIRONMENT: Lives with: lives with their spouse Lives in: House/apartment 2 levels Stairs: 4-5 steps. Has following equipment at home: Single point cane, Walker - 2 wheeled, and shower chair  PLOF: Independent  PATIENT GOALS:  To improve bilateral hand cooridnation  OBJECTIVE:  Note: Objective measures were completed at Evaluation unless otherwise  noted.  HAND DOMINANCE: Right hand dominant  ADLs:  Transfers/ambulation related to ADLs: Eating: feels like hand, and mouth are not working together, drinks drip out of mouth, Difficulty using fork, and knife together for self-feeding. Grooming: Independent brushing teeth UB Dressing: Difficulty buttoning LB Dressing: Difficulty managing pants, shoes, and socks Toileting: Independent Bathing: incoordination with applying shampoo with bilateral hands Tub Shower transfers: Distant supervision   IADLs: Shopping: Delivery Light housekeeping: Difficulty with vacuuming-bending over Meal Prep:  Independent Community mobility:  Independent Medication management: Wife sets up pillbox-Pt. Takes pills independently  Financial management: No changes Handwriting: 75% legible  MOBILITY STATUS: Hx of falls  POSTURE COMMENTS:   Sitting balance: good  ACTIVITY TOLERANCE: Activity tolerance: Fair  FUNCTIONAL OUTCOME MEASURES: TBD  UPPER EXTREMITY ROM:    Active ROM Right Eval WFL Left Eval [facility]  Shoulder flexion    Shoulder abduction    Shoulder adduction    Shoulder extension    Shoulder internal rotation    Shoulder external rotation    Elbow flexion    Elbow extension    Wrist flexion    Wrist extension    Wrist ulnar deviation    Wrist radial deviation    Wrist pronation    Wrist supination    (Blank rows = not tested)  UPPER EXTREMITY MMT:     MMT Right Eval  Left Eval   Shoulder flexion 4+/5 4+/5  Shoulder abduction 4+/5 4+/5  Shoulder adduction    Shoulder extension    Shoulder internal rotation    Shoulder external rotation    Middle trapezius    Lower trapezius    Elbow flexion 5/5 5/5  Elbow extension 5/5 5/5  Wrist flexion    Wrist extension 5/5 5/5  Wrist ulnar deviation    Wrist radial deviation    Wrist pronation    Wrist supination    (Blank rows = not tested)  HAND FUNCTION: Grip strength: Right: 48 lbs; Left: 44 lbs, Lateral pinch:  Right: 17 lbs, Left: 16 lbs, and 3 point pinch: Right: 12 lbs, Left: 19 lbs  COORDINATION: 9 Hole Peg test: Right: 25 sec; Left: 26 sec  SENSATION: TBD. Pt. reports no changes  EDEMA: N/A  MUSCLE TONE: Intact;  Atrophy at the right dorsal interossei muscles.  COGNITION: Overall cognitive status: WFL for tasks performed  VISION:  Wears glasses. No change from baseline   PERCEPTION: intact   OBSERVATIONS: Pt. Left his 2 wheeled scooter in the waiting/reception area  TREATMENT DATE:  11/14/23  Therapeutic Activities:   -Theraputty bilateral hand strengthening exercises including: gross gripping, gross digit extension, lateral, and 3pt. pinch strengthening, thumb opposition, bilateral alternating twisting motion in opposite directions with supination/pronation, rolling circular spheres within the tips of the fingers, and manipulating putty within the tips of fingers to remove small objects. Pt. Was provided with a visual handout HEP through [employer] with a video access code.  -Lateral, and 3pt. Pinch strengthening using yellow, red, green, and blue level resistive clips  -Facilitated translatory movements moving clips from the lateral pinch position to the 3pt. Pinch position in preparation for securely placing them on the dowel.   Therapeutic Ex.:   Progressive gross grip strengthening with 28.9 # of grip strength resistive force.  -Incorporated reaching in multiple planes to further challenge the task.       PATIENT EDUCATION: Education details: OT services, POC, goals, ADL/IADL functional status. Person educated: Patient Education method: Medical illustrator Education comprehension: verbalized understanding and returned demonstration  HOME EXERCISE PROGRAM: To be assessed, and provided as indicated   GOALS: Goals reviewed with  patient? Yes  SHORT TERM GOALS: Target date: 12/19/2023  Pt. Will be independent with HEPs for BUEs  Baseline: Eval: No current HEP Goal status: INITIAL  LONG TERM GOALS: Target date: 01/30/2024  Pt. Will perform buttoning with modified independence Baseline: Eval: Buttoning is difficult Goal status: INITIAL  2.  Pt. Will be able to cut food with Modified independence Baseline: Eval: Pt. Has difficulty using bilateral hand ts cut food. Goal status: INITIAL  3.  Pt. Will improve bilateral UE strength by 2 mm grades to be able to sustain bilateral UEs in elevation while coordinating bilateral hands for shampooing. Baseline: Eval: Pt. Has difficulty coordinating UEs during shampooing. Goal status: INITIAL  4.  Pt. Will demonstrate compensatory/adaptive equipment strategies for LE dressing tasks.  Baseline: Eval:Pt. Has difficulty performing LE dressing   Goal status: INITIAL  5.  Pt. Will identify 2 compensatory strategies for bilateral tremors during ADLs/IADLs Baseline: Eval: Education to be provided Goal status: INITIAL  ASSESSMENT:  CLINICAL IMPRESSION:  Pt. Reports feeling better this week, and that his respiratory issues have improved. Pt. requires verbal cues, and cues for visual demonstration for the proper technique of the hand strengthening exercises with  theraputty, the gross grip strengthener, and the resistive clips. Pt. will benefit from OT services to work on improving BUE functioning, and maximize independence with ADLs, and IADL tasks.   PERFORMANCE DEFICITS: in functional skills including ADLs, IADLs, coordination, dexterity, ROM, strength, pain, Fine motor control, Gross motor control, and UE functional use, cognitive skills including , and psychosocial skills including coping strategies, environmental adaptation, and routines and behaviors.   IMPAIRMENTS: are limiting patient from ADLs, IADLs, and leisure.   CO-MORBIDITIES: may have co-morbidities  that affects  occupational performance. Patient will benefit from skilled OT to address above impairments and improve overall function.  MODIFICATION OR ASSISTANCE TO COMPLETE EVALUATION: Min-Moderate modification of tasks or assist with assess necessary to complete an evaluation.  OT OCCUPATIONAL PROFILE AND HISTORY: Detailed assessment: Review of records and additional review of physical, cognitive, psychosocial history related to current functional performance.  CLINICAL DECISION MAKING: Moderate - several treatment options, min-mod task modification necessary  REHAB POTENTIAL: Good  EVALUATION COMPLEXITY: Moderate    PLAN:  OT FREQUENCY: 2x/month  OT DURATION: 12 weeks  PLANNED INTERVENTIONS: 97535 self care/ADL training, 16109 therapeutic exercise, 97530 therapeutic activity, 97112 neuromuscular re-education, 97140 manual therapy,  81191 paraffin, 47829 moist heat, 97034 contrast bath, energy conservation, patient/family education, and DME and/or AE instructions  RECOMMENDED OTHER SERVICES: PT  CONSULTED AND AGREED WITH PLAN OF CARE: Patient  PLAN FOR NEXT SESSION: Treatment   Olegario Messier, MS, OTR/L 11/14/2023, 10:04 PM

## 2023-11-18 ENCOUNTER — Encounter: Payer: Self-pay | Admitting: Nurse Practitioner

## 2023-11-18 ENCOUNTER — Ambulatory Visit (INDEPENDENT_AMBULATORY_CARE_PROVIDER_SITE_OTHER): Admitting: Nurse Practitioner

## 2023-11-18 VITALS — BP 128/82 | HR 59 | Temp 98.6°F | Resp 16 | Ht 67.01 in | Wt 142.8 lb

## 2023-11-18 DIAGNOSIS — F419 Anxiety disorder, unspecified: Secondary | ICD-10-CM | POA: Diagnosis not present

## 2023-11-18 DIAGNOSIS — R634 Abnormal weight loss: Secondary | ICD-10-CM | POA: Diagnosis not present

## 2023-11-18 DIAGNOSIS — I10 Essential (primary) hypertension: Secondary | ICD-10-CM

## 2023-11-18 MED ORDER — PROPRANOLOL HCL 10 MG PO TABS: 10.0000 mg | ORAL_TABLET | Freq: Two times a day (BID) | ORAL | 2 refills | Status: DC

## 2023-11-18 NOTE — Assessment & Plan Note (Signed)
 Chronic.  Controlled.  Continue with current medication regimen of Amlodipine.  Labs ordered today.  Return to clinic in 3 months for reevaluation.  Call sooner if concerns arise.

## 2023-11-18 NOTE — Progress Notes (Signed)
 BP 128/82 (BP Location: Left Arm, Patient Position: Sitting, Cuff Size: Normal)   Pulse (!) 59   Temp 98.6 F (37 C) (Oral)   Resp 16   Ht 5' 7.01" (1.702 m)   Wt 142 lb 12.8 oz (64.8 kg)   SpO2 98%   BMI 22.36 kg/m    Subjective:    Patient ID: [name], male    DOB: 11-12-1960, 63 y.o.   MRN: [id number]  HPI: [name] is a 63 y.o. male  Chief Complaint  Patient presents with   Blood pressure   Weight Check    Down again, will sample ensure   HYPERTENSION with Chronic Kidney Disease Dr. Cherylann Ratel changed Lisinopril to Amlodipine.   Hypertension status: controlled Satisfied with current treatment? no Duration of hypertension: years BP monitoring frequency:  not checking BP range:  BP medication side effects:  no Medication compliance: excellent compliance Previous BP meds:none Aspirin: no Recurrent headaches: no Visual changes: no Palpitations: no Dyspnea: no Chest pain: no Lower extremity edema: no Dizzy/lightheaded: no   Patient has lost 2lbs since last visit.  He has not been eating like he was before. He acknowledges that he will need to eat more to maintain his weight.    ANXIETY Patient has had anxiety for most of his life.  Does not want to be on maintenance medication.  Only uses the lorazepam PRN.  Does agree that something for his physical symptoms of shaking would be beneficial.   Relevant past medical, surgical, family and social history reviewed and updated as indicated. Interim medical history since our last visit reviewed. Allergies and medications reviewed and updated.  Review of Systems  Constitutional:  Positive for unexpected weight change.  Eyes:  Negative for visual disturbance.  Respiratory:  Negative for shortness of breath.   Cardiovascular:  Negative for chest pain and leg swelling.  Neurological:  Negative for light-headedness and headaches.  Psychiatric/Behavioral:  The patient is nervous/anxious.     Per HPI  unless specifically indicated above     Objective:    BP 128/82 (BP Location: Left Arm, Patient Position: Sitting, Cuff Size: Normal)   Pulse (!) 59   Temp 98.6 F (37 C) (Oral)   Resp 16   Ht 5' 7.01" (1.702 m)   Wt 142 lb 12.8 oz (64.8 kg)   SpO2 98%   BMI 22.36 kg/m   Wt Readings from Last 3 Encounters:  11/18/23 142 lb 12.8 oz (64.8 kg)  10/30/23 144 lb 3.2 oz (65.4 kg)  10/29/23 146 lb (66.2 kg)    Physical Exam Vitals and nursing note reviewed.  Constitutional:      General: He is not in acute distress.    Appearance: Normal appearance. He is not ill-appearing, toxic-appearing or diaphoretic.  HENT:     Head: Normocephalic.     Right Ear: External ear normal.     Left Ear: External ear normal.     Nose: Nose normal. No congestion or rhinorrhea.     Mouth/Throat:     Mouth: Mucous membranes are moist.  Eyes:     General:        Right eye: No discharge.        Left eye: No discharge.     Extraocular Movements: Extraocular movements intact.     Conjunctiva/sclera: Conjunctivae normal.     Pupils: Pupils are equal, round, and reactive to light.  Cardiovascular:     Rate and Rhythm: Normal rate and regular  rhythm.     Heart sounds: No murmur heard. Pulmonary:     Effort: Pulmonary effort is normal. No respiratory distress.     Breath sounds: Normal breath sounds. No wheezing, rhonchi or rales.  Abdominal:     General: Abdomen is flat. Bowel sounds are normal.  Musculoskeletal:     Cervical back: Normal range of motion and neck supple.  Skin:    General: Skin is warm and dry.     Capillary Refill: Capillary refill takes less than 2 seconds.  Neurological:     General: No focal deficit present.     Mental Status: He is alert and oriented to person, place, and time.     Comments: tremor  Psychiatric:        Mood and Affect: Mood normal.        Behavior: Behavior normal.        Thought Content: Thought content normal.        Judgment: Judgment normal.      Results for orders placed or performed in visit on 10/30/23  Comp Met (CMET)   Collection Time: 10/30/23 11:42 AM  Result Value Ref Range   Glucose 91 70 - 99 mg/dL   BUN 13 8 - 27 mg/dL   Creatinine, Ser 5.62 0.76 - 1.27 mg/dL   eGFR 98 >13 YQ/MVH/8.46   BUN/Creatinine Ratio 16 10 - 24   Sodium 139 134 - 144 mmol/L   Potassium 4.6 3.5 - 5.2 mmol/L   Chloride 100 96 - 106 mmol/L   CO2 23 20 - 29 mmol/L   Calcium 9.6 8.6 - 10.2 mg/dL   Total Protein 7.0 6.0 - 8.5 g/dL   Albumin 4.5 3.9 - 4.9 g/dL   Globulin, Total 2.5 1.5 - 4.5 g/dL   Bilirubin Total 0.6 0.0 - 1.2 mg/dL   Alkaline Phosphatase 89 44 - 121 IU/L   AST 19 0 - 40 IU/L   ALT 10 0 - 44 IU/L  VITAMIN D 25 Hydroxy (Vit-D Deficiency, Fractures)   Collection Time: 10/30/23 11:42 AM  Result Value Ref Range   Vit D, 25-Hydroxy 71.1 30.0 - 100.0 ng/mL  Vitamin B12   Collection Time: 10/30/23 11:42 AM  Result Value Ref Range   Vitamin B-12 1,344 (H) 232 - 1,245 pg/mL      Assessment & Plan:   Problem List Items Addressed This Visit       Cardiovascular and Mediastinum   Essential hypertension - Primary   Chronic.  Controlled.  Continue with current medication regimen of Amlodipine.  Labs ordered today.  Return to clinic in 3 months for reevaluation.  Call sooner if concerns arise.        Relevant Medications   propranolol (INDERAL) 10 MG tablet   Other Visit Diagnoses       Anxiety       Will start Propranolol. Discussed with patient about low HR. Typical HR is 60-80s for patient. Encouraged him to increase calorie intake. Has an appointment upcoming with Cardiology. Can consider decreasing dose of Amlodipine if BP is soft.  Follow up in 2 months.         Follow up plan: Return in about 2 months (around 01/18/2024) for HTN, HLD, DM2 FU.

## 2023-11-20 NOTE — Therapy (Signed)
 OUTPATIENT PHYSICAL THERAPY NEURO TREATMENT     Patient Name: [name] MRN: [id number] DOB:01/25/1961, 63 y.o., male Today's Date: 11/21/2023   PCP: Larae Grooms NP REFERRING PROVIDER: Susanne Borders PA  END OF SESSION:  PT End of Session - 11/21/23 1342     Visit Number 34    Number of Visits 43    Date for PT Re-Evaluation 01/16/24    Authorization Type eval 2/21    PT Start Time 1405    PT Stop Time 1444    PT Time Calculation (min) 39 min    Equipment Utilized During Treatment Gait belt    Activity Tolerance Patient tolerated treatment well    Behavior During Therapy [facility] for tasks assessed/performed                                           Past Medical History:  Diagnosis Date   ADHD    Arthritis    Concussion    COVID 12/2020   Hypertension    Multiple lacunar infarcts (HCC)    MVP (mitral valve prolapse)    Pernicious anemia    [facility] spotted fever    Spinal stenosis    Vestibular migraine    Vitamin B12 deficiency    Vitamin D deficiency    Past Surgical History:  Procedure Laterality Date   ANTERIOR CERVICAL DECOMP/DISCECTOMY FUSION N/A 07/30/2022   Procedure: C4-7 ANTERIOR CERVICAL DISCECTOMY AND FUSION (GLOBUS HEDRON);  Surgeon: Venetia Night, MD;  Location: [facility] ORS;  Service: Neurosurgery;  Laterality: N/A;   BILATERAL CARPAL TUNNEL RELEASE     CERVICAL WOUND DEBRIDEMENT N/A 08/01/2022   Procedure: CERVICAL WOUND DEBRIDEMENT OF HEMATOMA;  Surgeon: Venetia Night, MD;  Location: [facility] ORS;  Service: Neurosurgery;  Laterality: N/A;   FRACTURE SURGERY Left 1984   intramedullary rod   hip bone transplant  1984   LEG SURGERY Left    oseotomy   REPAIR ANKLE LIGAMENT Right 1978   REPAIR KNEE LIGAMENT Bilateral    acl   TRIGGER FINGER RELEASE Bilateral    thumbs   Patient Active Problem List   Diagnosis Date Noted   Dizziness 10/30/2023   Postoperative hematoma of musculoskeletal  structure following musculoskeletal procedure 08/01/2022   Cervical myelopathy (HCC) 07/30/2022   Cervical spinal stenosis 07/30/2022   Radiculopathy, cervical 07/30/2022   S/P cervical spinal fusion 07/30/2022   Vitamin D deficiency 06/25/2022   B12 deficiency 06/25/2022   Elevated LDL cholesterol level 06/25/2022   Prediabetes 03/27/2021   History of nausea- reported 01/27/2020 02/02/2020   Accidental fall from ladder 02/02/2020   Acute right-sided thoracic back pain 02/02/2020   Neck stiffness 02/02/2020   Concussion with no loss of consciousness 02/02/2020   Multiple falls 02/02/2020   Head trauma, initial encounter 02/02/2020   Acute post-traumatic headache, not intractable 02/02/2020   Essential hypertension 08/26/2018   Pre-bariatric surgery nutrition evaluation 06/19/2018   Migraine with vertigo 01/05/2014   Arthralgia of multiple joints 04/28/2009   Carpal tunnel syndrome 08/30/2008    ONSET DATE: summer 2022  REFERRING DIAG: LE weakness and gait instability   THERAPY DIAG:  Muscle weakness (generalized)  Unsteadiness on feet  Difficulty in walking, not elsewhere classified  Rationale for Evaluation and Treatment: Rehabilitation  SUBJECTIVE:  SUBJECTIVE STATEMENT: Patient had one fall since last session. Patient was blowing leaves and stepped on his own foot.   Pt accompanied by: self  PERTINENT HISTORY:  Patient presents for LE weakness and gait instability. He is s/p ACDF C-7 on 07/30/22. Patient has a leg length discrepancy and limited L knee flexion since his 20's. Patient has a lot of atrophy and lower back pain, foot drop bilaterally.  First symptoms was stumbling up stairs, with LLE. Foot drop was improved after ACDF. Now has numbness and tingling at the end of the day. Does  have a few lesions in his pons per wife, on blood pressure medication now. PMH includes ADHD, arthritis, concussion COVID, HTN, multiple lacunar infarcts, MVP, pernicious anemia, rocky mountain spotted fever, spinal stenosis, vestibular migraine, vitamin B deficiency.   PAIN:  Are you having pain?  Denies pain   PRECAUTIONS: Other: cervical; no lifting more than 20 lb   WEIGHT BEARING RESTRICTIONS: No  FALLS: Has patient fallen in last 6 months? Yes. Number of falls multiple a week  LIVING ENVIRONMENT: Lives with: lives with their family Lives in: House/apartment Stairs: Yes: Internal: flight steps; is sleeping downstairs Has following equipment at home: Single point cane  PLOF: Independent, uses a walking stick  PATIENT GOALS: gait control of feet, strengthen legs, balance, atrophy in hand  OBJECTIVE:   DIAGNOSTIC FINDINGS:  09/11/22: No acute fracture. No spondylolisthesis. No compression deformities. Disc space narrowing C3-4 with marginal osteophytes consistent with degenerative disc disease. Postop changes ACDF C4-C7 with anatomic alignment. Normal prevertebral and cervicocranial soft tissues.   06/19/22: 1. L5 chronic pars defects with L5-S1 anterolisthesis and advanced disc degeneration causing marked biforaminal L5 impingement. 2. Degeneration and short pedicles causes compressive spinal stenosis from L1-2 to L3-4, most advanced at L2-3. Moderate bilateral foraminal narrowing at the same levels.   COGNITION: Overall cognitive status: Within functional limits for tasks assessed    COORDINATION: Limited LLE  MUSCLE LENGTH: Significant limitation L hamstring; 60 degree flexion deficit.   POSTURE:  weight shift to the R  LOWER EXTREMITY ROM:     L knee flexion contraction -60 contraction LOWER EXTREMITY MMT:    MMT Right Eval R 5/15 R 8/22 Left Eval L 5/15 L 8/22  Hip flexion 4 5 5 4  4+ 4  Hip extension        Hip abduction 4- 4 4 4- 4 4  Hip adduction  4- 4 4 4- 4- 4  Hip internal rotation        Hip external rotation        Knee flexion 4- 4 4 3  4- 4-  Knee extension 4 4+ 5 4 4  4-  Ankle dorsiflexion 3 3 3+ 2+ 2+ 2+  Ankle plantarflexion 3 3+ 4- 3 3+ 3+  Ankle inversion        Ankle eversion        (Blank rows = not tested)  01/02/23 Right 8/22: RECERT  10/17 PN Left 8/22: Recert LUE 10/17: PN   Shoulder Flexion 4 4+ 5 4- 4+ 4+  Shoulder Abduction 4- 4 4+ 4- 4 4+  Shoulder Extension 4- 4+ 4 4- 4   Shoulder adduction 4- 4  3+ 4 4+  Bicep  4 4+ 5 4 4+ 4  Triceps 4- 4- 5 3+ 4 4     Right Left  Hip flexion 12.2 10.2  Hip Abduction 7.4 7.0  Hip Adduction 10.8 10.5  Knee Extension  13.8 13.4  Knee  Flexion 13.4 13.2  DF    PF       BED MOBILITY:  Perform next session  TRANSFERS: Assistive device utilized: Single point cane  Sit to stand: CGA Stand to sit: CGA Chair to chair: CGA   GAIT: Gait pattern: L knee limited extension, L foot drop, L foot eversion Distance walked: 1200 Assistive device utilized: Single point cane Level of assistance: CGA Comments: Patient has increased eversion and rolling of L foot, LLE leg length discrepancy.   FUNCTIONAL TESTS:  5 times sit to stand: 5.6 seconds with minor use of LLE no UE support Timed up and go (TUG): n/a 6 minute walk test: 1200 ft 10 meter walk test: 7 seconds BERG: 36/56   PATIENT SURVEYS:  FOTO 54  TODAY'S TREATMENT:                                                                                                                              DATE: 11/21/23 Unless otherwise stated, CGA was provided and gait belt donned in order to ensure pt safety    TherAct: 6" step: -toe taps 10x each LE; focus on coordination/spatial awareness  -step up/down 10x each LE -eccentric step down with UE support 10x each LE   Activity Description: red=right, green-left 3 on floor 3 on table Activity Setting:  The [employer] Random setting was chosen to enhance cognitive  processing and agility, providing an unpredictable environment to simulate real-world scenarios, and fostering quick reactions and adaptability.   Number of Pods:  6 Cycles/Sets:  3 Duration (Time or Hit Count):  20  Sit to stand raise ball 10x ; 2 sets  TherEx:  Adductor squeeze 15x  Adduction squeeze with ER/IR 10x each LE   5lb ankle weights; -LAQ 15x each LE hold at top 3 seconds -march with arms crossed 15x each LE  -tap step 30 seconds x 3 trials      PATIENT EDUCATION: Education details: goals, POC, educated on safety during updated exercises  Person educated: Patient and Spouse Education method: Explanation, Demonstration, Tactile cues, and Verbal cues Education comprehension: verbalized understanding, returned demonstration, verbal cues required, and tactile cues required  HOME EXERCISE PROGRAM: Access Code: [email or web address] URL: [email or web address] Date: 10/11/2022 Prepared by: Precious Bard  Exercises - Seated Ankle Alphabet  - 1 x daily - 7 x weekly - 2 sets - 10 reps - 5 hold - Seated Heel Toe Raises  - 1 x daily - 7 x weekly - 2 sets - 10 reps - 5 hold - Standing March with Counter Support  - 1 x daily - 7 x weekly - 2 sets - 10 reps - 5 hold  Access Code: [email or web address] URL: [email or web address] Date: 10/30/2022 Prepared by: Precious Bard  Exercises - Seated Ankle Plantarflexion with Resistance  - 1 x daily - 7 x weekly - 2 sets - 10 reps - 5 hold - Seated Ankle Eversion with Resistance  - 1 x daily -  7 x weekly - 2 sets - 10 reps - 5 hold  Access Code: [email or web address] URL: [email or web address] Date: 11/14/2022 Prepared by: Precious Bard  Exercises - Seated Heel Toe Raises  - 1 x daily - 7 x weekly - 2 sets - 10 reps - 5 hold - Seated Hip Internal Rotation AROM  - 1 x daily - 7 x weekly - 2 sets - 10 reps - 5 hold - Bilateral Long Arc Quad  - 1 x daily - 7 x weekly - 2 sets - 10 reps - 5 hold - Foam Balance Tandem stance  - 1  x daily - 7 x weekly - 2 sets - 2 reps - 30 hold - Single Leg Balance on Foam  - 1 x daily - 7 x weekly - 2 sets - 30 reps - 2 hold - Seated Hip Flexion March with Ankle Weights  - 1 x daily - 7 x weekly - 2 sets - 10 reps - 5 hold - Standing Hip Abduction with Ankle Weight  - 1 x daily - 7 x weekly - 2 sets - 10 reps - 5 hold - Standing Hip Extension with Ankle Weight  - 1 x daily - 7 x weekly - 2 sets - 10 reps - 5 hold - Standing Hip Flexion with Ankle Weight  - 1 x daily - 7 x weekly - 2 sets - 10 reps - 5 hold - Seated Long Arc Quad with Ankle Weight  - 1 x daily - 7 x weekly - 2 sets - 10 reps - 5 hold Access Code: [email or web address] URL: [email or web address] Date: 08/22/2023 Prepared by: Precious Bard  Exercises - Seated Finger Tip Pinch with Putty  - 1 x daily - 7 x weekly - 2 sets - 10 reps - 5 hold - Hand Squeezes  - 1 x daily - 7 x weekly - 2 sets - 10 reps - 5 hold - Key Pinch with Putty  - 1 x daily - 7 x weekly - 2 sets - 10 reps - 5 hold  GOALS: Goals reviewed with patient? Yes  SHORT TERM GOALS: Target date: 05/09/2023   Patient will be independent in home exercise program to improve strength/mobility for better functional independence with ADLs. Baseline: 2/21: HEP give next session 5/15 : intermittent compliance 8/22: intermittent compliance due to illness 11/14: compliant  Goal status: MET    LONG TERM GOALS: Target date: 01/16/2024    Patient will increase FOTO score to equal to or greater than  64%   to demonstrate statistically significant improvement in mobility and quality of life.  Baseline: 2/21: 54% 8/22: 52% 10/17: 57% 11/14: 53% 1/16: 47% Goal status: terminated  2.  Patient will increase Berg Balance score by > 6 points (42/56)to demonstrate decreased fall risk during functional activities. Baseline: 2/21: 36/56 5/15: 45   Goal status: MET  3.  Patient will increase six minute walk test distance to >1500 for progression to age norm community  ambulator and improve gait ability Baseline: 2/21: 1200 ft with [facility] 5/15: 1300 ft with [facility]  8/22: 905 ft with [facility] 10/17: 1100 ft with [facility] 11/14: 1110 ft with [facility] 1/16: 1210 ft with [facility] 3/6: 1100 ft with two canes Goal status: In progress  4.  Patient will increase BLE gross strength to 4+/5  or within 1 lb of each LE as to improve functional strength for independent gait, increased standing tolerance and increased ADL ability. Baseline: 2/21 :see above 5/15: see above  10/17: RLE 5/5 LLE 4/5 L ankle 3/5  11/14: RLE 5/5 LLE 4+/5 hamstring 4/5, ankle: 3+/5 1/16: RLE 5/5 LLE 4+/5 ankle 4-/5 3/6: see above  Goal status:  In progress / modified  6.  Patient will increase Berg Balance score by > 6 points (51/56) to demonstrate decreased fall risk during functional activities. Baseline:  5/15: 45 8/22: 41/56 10/17: 28 11/14: 36 1/16: 36 3/6: 25 Goal status: Ongoing  7.   Patient will increase UE strength to >4+/5 for ADLs, AD usage, and quality of life.  Baseline: 5/15: see above 8/22: see above 11/14: >4+/5 bilaterally  Goal status: MET  8.   Patient will increase lower extremity functional scale to >60/80 to demonstrate improved functional mobility and increased tolerance with ADLs.  Baseline: 11/14: 29%  1/16: 29%  3/6: 19%  Goal status: ongoing   ASSESSMENT:  CLINICAL IMPRESSION: Patient has retropulsion with forward trunk lean resulting in instability. Patient is highly  motivated for independent stabile mobility. LE strengthening tolerated well with patient fatiguing in seated position.  Patient will benefit from skilled physical therapy to improve strength, mobility, and stability for improved quality of life.   OBJECTIVE IMPAIRMENTS: Abnormal gait, decreased activity tolerance, decreased balance, decreased coordination, decreased endurance, decreased mobility, difficulty walking, decreased ROM, decreased strength, hypomobility, impaired flexibility, improper body mechanics, and postural  dysfunction.   ACTIVITY LIMITATIONS: carrying, lifting, bending, standing, squatting, stairs, transfers, reach over head, locomotion level, and caring for others  PARTICIPATION LIMITATIONS: meal prep, cleaning, laundry, personal finances, driving, shopping, community activity, and yard work  PERSONAL FACTORS: Age, Past/current experiences, Time since onset of injury/illness/exacerbation, and 3+ comorbidities: DHD, arthritis, concussion COVID, HTN, multiple lacunar infarcts, MVP, pernicious anemia, rocky mountain spotted fever, spinal stenosis, vestibular migraine, vitamin B deficiency.   are also affecting patient's functional outcome.   REHAB POTENTIAL: Good  CLINICAL DECISION MAKING: Evolving/moderate complexity  EVALUATION COMPLEXITY: Moderate  PLAN:   PT FREQUENCY: 1x/week  PT DURATION: 12 weeks  PLANNED INTERVENTIONS: Therapeutic exercises, Therapeutic activity, Neuromuscular re-education, Balance training, Gait training, Patient/Family education, Self Care, Joint mobilization, Stair training, Vestibular training, Canalith repositioning, Visual/preceptual remediation/compensation, Orthotic/Fit training, DME instructions, Cognitive remediation, Electrical stimulation, Spinal mobilization, Cryotherapy, Moist heat, Splintting, Taping, Traction, Ultrasound, Manual therapy, and Re-evaluation  PLAN FOR NEXT SESSION:  balance, strength, unstable surfaces    Precious Bard, PT, DPT Physical Therapist - [place] [facility]  Outpatient Physical Therapy- Main Campus [phone]

## 2023-11-21 ENCOUNTER — Ambulatory Visit: Payer: 59 | Attending: Neurosurgery

## 2023-11-21 ENCOUNTER — Other Ambulatory Visit: Payer: Self-pay | Admitting: Family Medicine

## 2023-11-21 ENCOUNTER — Ambulatory Visit: Admitting: Occupational Therapy

## 2023-11-21 DIAGNOSIS — R2681 Unsteadiness on feet: Secondary | ICD-10-CM | POA: Diagnosis not present

## 2023-11-21 DIAGNOSIS — F0670 Mild neurocognitive disorder due to known physiological condition without behavioral disturbance: Secondary | ICD-10-CM | POA: Diagnosis not present

## 2023-11-21 DIAGNOSIS — Z9189 Other specified personal risk factors, not elsewhere classified: Secondary | ICD-10-CM

## 2023-11-21 DIAGNOSIS — R278 Other lack of coordination: Secondary | ICD-10-CM

## 2023-11-21 DIAGNOSIS — M6281 Muscle weakness (generalized): Secondary | ICD-10-CM

## 2023-11-21 DIAGNOSIS — R262 Difficulty in walking, not elsewhere classified: Secondary | ICD-10-CM | POA: Insufficient documentation

## 2023-11-21 DIAGNOSIS — R4681 Obsessive-compulsive behavior: Secondary | ICD-10-CM | POA: Diagnosis not present

## 2023-11-21 DIAGNOSIS — F840 Autistic disorder: Secondary | ICD-10-CM | POA: Diagnosis not present

## 2023-11-21 NOTE — Therapy (Signed)
 OUTPATIENT OCCUPATIONAL THERAPY NEURO TREATMENT NOTE  Patient Name: [name] MRN: [id number] DOB:1960/11/15, 63 y.o., male Today's Date: 11/21/2023  PCP: Larae Grooms, NP REFERRING PROVIDER: Lonell Face, MD  END OF SESSION:  OT End of Session - 11/21/23 1536     Visit Number 3    Number of Visits 24    Date for OT Re-Evaluation 01/30/24    OT Start Time 1315    OT Stop Time 1400    OT Time Calculation (min) 45 min    Activity Tolerance Patient tolerated treatment well    Behavior During Therapy [facility] for tasks assessed/performed             Past Medical History:  Diagnosis Date   ADHD    Arthritis    Concussion    COVID 12/2020   Hypertension    Multiple lacunar infarcts (HCC)    MVP (mitral valve prolapse)    Pernicious anemia    [facility] spotted fever    Spinal stenosis    Vestibular migraine    Vitamin B12 deficiency    Vitamin D deficiency    Past Surgical History:  Procedure Laterality Date   ANTERIOR CERVICAL DECOMP/DISCECTOMY FUSION N/A 07/30/2022   Procedure: C4-7 ANTERIOR CERVICAL DISCECTOMY AND FUSION (GLOBUS HEDRON);  Surgeon: Venetia Night, MD;  Location: [facility] ORS;  Service: Neurosurgery;  Laterality: N/A;   BILATERAL CARPAL TUNNEL RELEASE     CERVICAL WOUND DEBRIDEMENT N/A 08/01/2022   Procedure: CERVICAL WOUND DEBRIDEMENT OF HEMATOMA;  Surgeon: Venetia Night, MD;  Location: [facility] ORS;  Service: Neurosurgery;  Laterality: N/A;   FRACTURE SURGERY Left 1984   intramedullary rod   hip bone transplant  1984   LEG SURGERY Left    oseotomy   REPAIR ANKLE LIGAMENT Right 1978   REPAIR KNEE LIGAMENT Bilateral    acl   TRIGGER FINGER RELEASE Bilateral    thumbs   Patient Active Problem List   Diagnosis Date Noted   Dizziness 10/30/2023   Postoperative hematoma of musculoskeletal structure following musculoskeletal procedure 08/01/2022   Cervical myelopathy (HCC) 07/30/2022   Cervical spinal stenosis 07/30/2022    Radiculopathy, cervical 07/30/2022   S/P cervical spinal fusion 07/30/2022   Vitamin D deficiency 06/25/2022   B12 deficiency 06/25/2022   Elevated LDL cholesterol level 06/25/2022   Prediabetes 03/27/2021   History of nausea- reported 01/27/2020 02/02/2020   Accidental fall from ladder 02/02/2020   Acute right-sided thoracic back pain 02/02/2020   Neck stiffness 02/02/2020   Concussion with no loss of consciousness 02/02/2020   Multiple falls 02/02/2020   Head trauma, initial encounter 02/02/2020   Acute post-traumatic headache, not intractable 02/02/2020   Essential hypertension 08/26/2018   Pre-bariatric surgery nutrition evaluation 06/19/2018   Migraine with vertigo 01/05/2014   Arthralgia of multiple joints 04/28/2009   Carpal tunnel syndrome 08/30/2008    ONSET DATE: 07/30/2022  REFERRING DIAG: ACDF, Hx of CVAs  THERAPY DIAG:  Muscle weakness (generalized)  Other lack of coordination  Rationale for Evaluation and Treatment: Rehabilitation  SUBJECTIVE:   SUBJECTIVE STATEMENT: Pt. Uses a manual scooter to help his knee when navigating longer distances.   Pt accompanied by: self  PERTINENT HISTORY: Pt. underwent an ACDF C4-C7. L5-S1 anterolithesis, and disc degeneration causing marked biforminal L5 impingement, Spinal stenosis from L1-2 to L3-4 with L2-3 being the most advanced. Bilateral hand tremors. Bilateral Lacunar infarcts in 2020. See above list for PMHx .  PRECAUTIONS: None  WEIGHT BEARING RESTRICTIONS: No  PAIN:  Are you having pain? None reported  FALLS: Has patient fallen in last 6 months? Yes. Number of falls 6-10 falls  LIVING ENVIRONMENT: Lives with: lives with their spouse Lives in: House/apartment 2 levels Stairs: 4-5 steps. Has following equipment at home: Single point cane, Walker - 2 wheeled, and shower chair  PLOF: Independent  PATIENT GOALS:  To improve bilateral hand cooridnation  OBJECTIVE:  Note: Objective measures were completed at  Evaluation unless otherwise noted.  HAND DOMINANCE: Right hand dominant  ADLs:  Transfers/ambulation related to ADLs: Eating: feels like hand, and mouth are not working together, drinks drip out of mouth, Difficulty using fork, and knife together for self-feeding. Grooming: Independent brushing teeth UB Dressing: Difficulty buttoning LB Dressing: Difficulty managing pants, shoes, and socks Toileting: Independent Bathing: incoordination with applying shampoo with bilateral hands Tub Shower transfers: Distant supervision   IADLs: Shopping: Delivery Light housekeeping: Difficulty with vacuuming-bending over Meal Prep:  Independent Community mobility:  Independent Medication management: Wife sets up pillbox-Pt. Takes pills independently  Financial management: No changes Handwriting: 75% legible  MOBILITY STATUS: Hx of falls  POSTURE COMMENTS:   Sitting balance: good  ACTIVITY TOLERANCE: Activity tolerance: Fair  FUNCTIONAL OUTCOME MEASURES: TBD  UPPER EXTREMITY ROM:    Active ROM Right Eval WFL Left Eval [facility]  Shoulder flexion    Shoulder abduction    Shoulder adduction    Shoulder extension    Shoulder internal rotation    Shoulder external rotation    Elbow flexion    Elbow extension    Wrist flexion    Wrist extension    Wrist ulnar deviation    Wrist radial deviation    Wrist pronation    Wrist supination    (Blank rows = not tested)  UPPER EXTREMITY MMT:     MMT Right Eval  Left Eval   Shoulder flexion 4+/5 4+/5  Shoulder abduction 4+/5 4+/5  Shoulder adduction    Shoulder extension    Shoulder internal rotation    Shoulder external rotation    Middle trapezius    Lower trapezius    Elbow flexion 5/5 5/5  Elbow extension 5/5 5/5  Wrist flexion    Wrist extension 5/5 5/5  Wrist ulnar deviation    Wrist radial deviation    Wrist pronation    Wrist supination    (Blank rows = not tested)  HAND FUNCTION: Grip strength: Right: 48 lbs;  Left: 44 lbs, Lateral pinch: Right: 17 lbs, Left: 16 lbs, and 3 point pinch: Right: 12 lbs, Left: 19 lbs  COORDINATION: 9 Hole Peg test: Right: 25 sec; Left: 26 sec  SENSATION: TBD. Pt. reports no changes  EDEMA: N/A  MUSCLE TONE: Intact;  Atrophy at the right dorsal interossei muscles.  COGNITION: Overall cognitive status: WFL for tasks performed  VISION:  Wears glasses. No change from baseline   PERCEPTION: intact   OBSERVATIONS: Pt. Left his 2 wheeled scooter in the waiting/reception area  TREATMENT DATE:  11/21/23  Therapeutic Activities:  -Facilitated translatory movements for moving the jumbo pegs form the palm of the hand to the tip of the 2nd digit and thumb in preparation for setting them up in the pegboard. -Lateral, and 3pt. pinch strengthening using yellow, red, green, and blue level resistive clips  -Facilitated translatory movements moving clips from the lateral pinch position to the 3pt. Pinch position in preparation for securely placing them on the dowel.    Therapeutic Ex.:   -UE strengthening through reciprocal motion using the SciFit for 8 min. On level 4.0 for half the time, and modified to level 1.0 for the 2nd half. with constant monitoring of the BUEs. Pt. worked on changing, and alternating forward reverse position every 2 min. rest breaks were required.   Progressive bilateral gross grip strengthening with 23.4# of grip strength resistive force.  -Incorporated reaching in multiple planes to further challenge the task.    PATIENT EDUCATION: Education details: OT services, POC, goals, ADL/IADL functional status. Person educated: Patient Education method: Medical illustrator Education comprehension: verbalized understanding and returned demonstration  HOME EXERCISE PROGRAM: To be assessed, and provided as  indicated   GOALS: Goals reviewed with patient? Yes  SHORT TERM GOALS: Target date: 12/19/2023  Pt. Will be independent with HEPs for BUEs  Baseline: Eval: No current HEP Goal status: INITIAL  LONG TERM GOALS: Target date: 01/30/2024  Pt. Will perform buttoning with modified independence Baseline: Eval: Buttoning is difficult Goal status: INITIAL  2.  Pt. Will be able to cut food with Modified independence Baseline: Eval: Pt. Has difficulty using bilateral hand ts cut food. Goal status: INITIAL  3.  Pt. Will improve bilateral UE strength by 2 mm grades to be able to sustain bilateral UEs in elevation while coordinating bilateral hands for shampooing. Baseline: Eval: Pt. Has difficulty coordinating UEs during shampooing. Goal status: INITIAL  4.  Pt. Will demonstrate compensatory/adaptive equipment strategies for LE dressing tasks.  Baseline: Eval:Pt. Has difficulty performing LE dressing   Goal status: INITIAL  5.  Pt. Will identify 2 compensatory strategies for bilateral tremors during ADLs/IADLs Baseline: Eval: Education to be provided Goal status: INITIAL  ASSESSMENT:  CLINICAL IMPRESSION:  Pt. presents with difficulty performing translatory skills moving the jumbo pegs through his hand in preparation for setting them up into the pegboard, and moving the resistive clips from one clip position to the next. Pt. required the work level on the SCIFit to be modified. Pt. requires verbal cues, and cues for visual demonstration for the proper technique of the hand strengthening exercises with theraputty, the gross grip strengthener, and the resistive clips. Pt. will benefit from OT services to work on improving BUE functioning, and maximize independence with ADLs, and IADL tasks.   PERFORMANCE DEFICITS: in functional skills including ADLs, IADLs, coordination, dexterity, ROM, strength, pain, Fine motor control, Gross motor control, and UE functional use, cognitive skills including ,  and psychosocial skills including coping strategies, environmental adaptation, and routines and behaviors.   IMPAIRMENTS: are limiting patient from ADLs, IADLs, and leisure.   CO-MORBIDITIES: may have co-morbidities  that affects occupational performance. Patient will benefit from skilled OT to address above impairments and improve overall function.  MODIFICATION OR ASSISTANCE TO COMPLETE EVALUATION: Min-Moderate modification of tasks or assist with assess necessary to complete an evaluation.  OT OCCUPATIONAL PROFILE AND HISTORY: Detailed assessment: Review of records and additional review of physical, cognitive, psychosocial history related to current functional performance.  CLINICAL DECISION MAKING: Moderate -  several treatment options, min-mod task modification necessary  REHAB POTENTIAL: Good  EVALUATION COMPLEXITY: Moderate    PLAN:  OT FREQUENCY: 2x/month  OT DURATION: 12 weeks  PLANNED INTERVENTIONS: 97535 self care/ADL training, 69629 therapeutic exercise, 97530 therapeutic activity, 97112 neuromuscular re-education, 97140 manual therapy, 97018 paraffin, 52841 moist heat, 97034 contrast bath, energy conservation, patient/family education, and DME and/or AE instructions  RECOMMENDED OTHER SERVICES: PT  CONSULTED AND AGREED WITH PLAN OF CARE: Patient  PLAN FOR NEXT SESSION: Treatment   Olegario Messier, MS, OTR/L 11/21/2023, 4:58 PM

## 2023-11-26 ENCOUNTER — Other Ambulatory Visit

## 2023-11-26 DIAGNOSIS — Z9189 Other specified personal risk factors, not elsewhere classified: Secondary | ICD-10-CM

## 2023-11-27 ENCOUNTER — Encounter: Payer: Self-pay | Admitting: Family Medicine

## 2023-11-27 ENCOUNTER — Other Ambulatory Visit: Payer: Self-pay | Admitting: Family Medicine

## 2023-11-27 DIAGNOSIS — Z789 Other specified health status: Secondary | ICD-10-CM

## 2023-11-27 LAB — MEASLES/MUMPS/RUBELLA IMMUNITY
MUMPS ABS, IGG: 71.3 [AU]/ml (ref >10.9)
RUBEOLA AB, IGG: <13.5 [AU]/ml — ABNORMAL LOW (ref >16.4)
Rubella Antibodies, IGG: 9.83 {index} (ref >0.99)

## 2023-11-27 NOTE — Therapy (Signed)
 OUTPATIENT PHYSICAL THERAPY NEURO TREATMENT     Patient Name: [name] MRN: [id number] DOB:November 21, 1960, 63 y.o., male Today's Date: 11/27/2023   PCP: Larae Grooms NP REFERRING PROVIDER: Susanne Borders PA  END OF SESSION:                                  Past Medical History:  Diagnosis Date   ADHD    Arthritis    Concussion    COVID 12/2020   Hypertension    Multiple lacunar infarcts (HCC)    MVP (mitral valve prolapse)    Pernicious anemia    [facility] spotted fever    Spinal stenosis    Vestibular migraine    Vitamin B12 deficiency    Vitamin D deficiency    Past Surgical History:  Procedure Laterality Date   ANTERIOR CERVICAL DECOMP/DISCECTOMY FUSION N/A 07/30/2022   Procedure: C4-7 ANTERIOR CERVICAL DISCECTOMY AND FUSION (GLOBUS HEDRON);  Surgeon: Venetia Night, MD;  Location: [facility] ORS;  Service: Neurosurgery;  Laterality: N/A;   BILATERAL CARPAL TUNNEL RELEASE     CERVICAL WOUND DEBRIDEMENT N/A 08/01/2022   Procedure: CERVICAL WOUND DEBRIDEMENT OF HEMATOMA;  Surgeon: Venetia Night, MD;  Location: [facility] ORS;  Service: Neurosurgery;  Laterality: N/A;   FRACTURE SURGERY Left 1984   intramedullary rod   hip bone transplant  1984   LEG SURGERY Left    oseotomy   REPAIR ANKLE LIGAMENT Right 1978   REPAIR KNEE LIGAMENT Bilateral    acl   TRIGGER FINGER RELEASE Bilateral    thumbs   Patient Active Problem List   Diagnosis Date Noted   Dizziness 10/30/2023   Postoperative hematoma of musculoskeletal structure following musculoskeletal procedure 08/01/2022   Cervical myelopathy (HCC) 07/30/2022   Cervical spinal stenosis 07/30/2022   Radiculopathy, cervical 07/30/2022   S/P cervical spinal fusion 07/30/2022   Vitamin D deficiency 06/25/2022   B12 deficiency 06/25/2022   Elevated LDL cholesterol level 06/25/2022   Prediabetes 03/27/2021   History of nausea- reported 01/27/2020 02/02/2020   Accidental  fall from ladder 02/02/2020   Acute right-sided thoracic back pain 02/02/2020   Neck stiffness 02/02/2020   Concussion with no loss of consciousness 02/02/2020   Multiple falls 02/02/2020   Head trauma, initial encounter 02/02/2020   Acute post-traumatic headache, not intractable 02/02/2020   Essential hypertension 08/26/2018   Pre-bariatric surgery nutrition evaluation 06/19/2018   Migraine with vertigo 01/05/2014   Arthralgia of multiple joints 04/28/2009   Carpal tunnel syndrome 08/30/2008    ONSET DATE: summer 2022  REFERRING DIAG: LE weakness and gait instability   THERAPY DIAG:  No diagnosis found.  Rationale for Evaluation and Treatment: Rehabilitation  SUBJECTIVE:  SUBJECTIVE STATEMENT: ***  Pt accompanied by: self  PERTINENT HISTORY:  Patient presents for LE weakness and gait instability. He is s/p ACDF C-7 on 07/30/22. Patient has a leg length discrepancy and limited L knee flexion since his 20's. Patient has a lot of atrophy and lower back pain, foot drop bilaterally.  First symptoms was stumbling up stairs, with LLE. Foot drop was improved after ACDF. Now has numbness and tingling at the end of the day. Does have a few lesions in his pons per wife, on blood pressure medication now. PMH includes ADHD, arthritis, concussion COVID, HTN, multiple lacunar infarcts, MVP, pernicious anemia, rocky mountain spotted fever, spinal stenosis, vestibular migraine, vitamin B deficiency.   PAIN:  Are you having pain?  Denies pain   PRECAUTIONS: Other: cervical; no lifting more than 20 lb   WEIGHT BEARING RESTRICTIONS: No  FALLS: Has patient fallen in last 6 months? Yes. Number of falls multiple a week  LIVING ENVIRONMENT: Lives with: lives with their family Lives in: House/apartment Stairs:  Yes: Internal: flight steps; is sleeping downstairs Has following equipment at home: Single point cane  PLOF: Independent, uses a walking stick  PATIENT GOALS: gait control of feet, strengthen legs, balance, atrophy in hand  OBJECTIVE:   DIAGNOSTIC FINDINGS:  09/11/22: No acute fracture. No spondylolisthesis. No compression deformities. Disc space narrowing C3-4 with marginal osteophytes consistent with degenerative disc disease. Postop changes ACDF C4-C7 with anatomic alignment. Normal prevertebral and cervicocranial soft tissues.   06/19/22: 1. L5 chronic pars defects with L5-S1 anterolisthesis and advanced disc degeneration causing marked biforaminal L5 impingement. 2. Degeneration and short pedicles causes compressive spinal stenosis from L1-2 to L3-4, most advanced at L2-3. Moderate bilateral foraminal narrowing at the same levels.   COGNITION: Overall cognitive status: Within functional limits for tasks assessed    COORDINATION: Limited LLE  MUSCLE LENGTH: Significant limitation L hamstring; 60 degree flexion deficit.   POSTURE:  weight shift to the R  LOWER EXTREMITY ROM:     L knee flexion contraction -60 contraction LOWER EXTREMITY MMT:    MMT Right Eval R 5/15 R 8/22 Left Eval L 5/15 L 8/22  Hip flexion 4 5 5 4  4+ 4  Hip extension        Hip abduction 4- 4 4 4- 4 4  Hip adduction 4- 4 4 4- 4- 4  Hip internal rotation        Hip external rotation        Knee flexion 4- 4 4 3  4- 4-  Knee extension 4 4+ 5 4 4  4-  Ankle dorsiflexion 3 3 3+ 2+ 2+ 2+  Ankle plantarflexion 3 3+ 4- 3 3+ 3+  Ankle inversion        Ankle eversion        (Blank rows = not tested)  01/02/23 Right 8/22: RECERT  10/17 PN Left 8/22: Recert LUE 10/17: PN   Shoulder Flexion 4 4+ 5 4- 4+ 4+  Shoulder Abduction 4- 4 4+ 4- 4 4+  Shoulder Extension 4- 4+ 4 4- 4   Shoulder adduction 4- 4  3+ 4 4+  Bicep  4 4+ 5 4 4+ 4  Triceps 4- 4- 5 3+ 4 4     Right Left  Hip flexion 12.2 10.2   Hip Abduction 7.4 7.0  Hip Adduction 10.8 10.5  Knee Extension  13.8 13.4  Knee Flexion 13.4 13.2  DF    PF       BED MOBILITY:  Perform next session  TRANSFERS: Assistive device utilized: Single point cane  Sit to stand: CGA Stand to sit: CGA Chair to chair: CGA   GAIT: Gait pattern: L knee limited extension, L foot drop, L foot eversion Distance walked: 1200 Assistive device utilized: Single point cane Level of assistance: CGA Comments: Patient has increased eversion and rolling of L foot, LLE leg length discrepancy.   FUNCTIONAL TESTS:  5 times sit to stand: 5.6 seconds with minor use of LLE no UE support Timed up and go (TUG): n/a 6 minute walk test: 1200 ft 10 meter walk test: 7 seconds BERG: 36/56   PATIENT SURVEYS:  FOTO 54  TODAY'S TREATMENT:                                                                                                                              DATE: 11/27/23 Unless otherwise stated, CGA was provided and gait belt donned in order to ensure pt safety    TherAct: 6" step: -toe taps 10x each LE; focus on coordination/spatial awareness  -step up/down 10x each LE -eccentric step down with UE support 10x each LE   Activity Description: red=right, green-left 3 on floor 3 on table Activity Setting:  The [employer] Random setting was chosen to enhance cognitive processing and agility, providing an unpredictable environment to simulate real-world scenarios, and fostering quick reactions and adaptability.   Number of Pods:  6 Cycles/Sets:  3 Duration (Time or Hit Count):  20  Sit to stand raise ball 10x ; 2 sets  TherEx:  Adductor squeeze 15x  Adduction squeeze with ER/IR 10x each LE   5lb ankle weights; -LAQ 15x each LE hold at top 3 seconds -march with arms crossed 15x each LE  -tap step 30 seconds x 3 trials      PATIENT EDUCATION: Education details: goals, POC, educated on safety during updated exercises  Person educated:  Patient and Spouse Education method: Explanation, Demonstration, Tactile cues, and Verbal cues Education comprehension: verbalized understanding, returned demonstration, verbal cues required, and tactile cues required  HOME EXERCISE PROGRAM: Access Code: [email or web address] URL: [email or web address] Date: 10/11/2022 Prepared by: Precious Bard  Exercises - Seated Ankle Alphabet  - 1 x daily - 7 x weekly - 2 sets - 10 reps - 5 hold - Seated Heel Toe Raises  - 1 x daily - 7 x weekly - 2 sets - 10 reps - 5 hold - Standing March with Counter Support  - 1 x daily - 7 x weekly - 2 sets - 10 reps - 5 hold  Access Code: [email or web address] URL: [email or web address] Date: 10/30/2022 Prepared by: Precious Bard  Exercises - Seated Ankle Plantarflexion with Resistance  - 1 x daily - 7 x weekly - 2 sets - 10 reps - 5 hold - Seated Ankle Eversion with Resistance  - 1 x daily - 7 x weekly - 2 sets - 10 reps - 5 hold  Access Code: [email or web address] URL:  [email or web address] Date: 11/14/2022 Prepared by: Precious Bard  Exercises - Seated Heel Toe Raises  - 1 x daily - 7 x weekly - 2 sets - 10 reps - 5 hold - Seated Hip Internal Rotation AROM  - 1 x daily - 7 x weekly - 2 sets - 10 reps - 5 hold - Bilateral Long Arc Quad  - 1 x daily - 7 x weekly - 2 sets - 10 reps - 5 hold - Foam Balance Tandem stance  - 1 x daily - 7 x weekly - 2 sets - 2 reps - 30 hold - Single Leg Balance on Foam  - 1 x daily - 7 x weekly - 2 sets - 30 reps - 2 hold - Seated Hip Flexion March with Ankle Weights  - 1 x daily - 7 x weekly - 2 sets - 10 reps - 5 hold - Standing Hip Abduction with Ankle Weight  - 1 x daily - 7 x weekly - 2 sets - 10 reps - 5 hold - Standing Hip Extension with Ankle Weight  - 1 x daily - 7 x weekly - 2 sets - 10 reps - 5 hold - Standing Hip Flexion with Ankle Weight  - 1 x daily - 7 x weekly - 2 sets - 10 reps - 5 hold - Seated Long Arc Quad with Ankle Weight  - 1 x daily - 7 x weekly -  2 sets - 10 reps - 5 hold Access Code: [email or web address] URL: [email or web address] Date: 08/22/2023 Prepared by: Precious Bard  Exercises - Seated Finger Tip Pinch with Putty  - 1 x daily - 7 x weekly - 2 sets - 10 reps - 5 hold - Hand Squeezes  - 1 x daily - 7 x weekly - 2 sets - 10 reps - 5 hold - Key Pinch with Putty  - 1 x daily - 7 x weekly - 2 sets - 10 reps - 5 hold  GOALS: Goals reviewed with patient? Yes  SHORT TERM GOALS: Target date: 05/09/2023   Patient will be independent in home exercise program to improve strength/mobility for better functional independence with ADLs. Baseline: 2/21: HEP give next session 5/15 : intermittent compliance 8/22: intermittent compliance due to illness 11/14: compliant  Goal status: MET    LONG TERM GOALS: Target date: 01/16/2024    Patient will increase FOTO score to equal to or greater than  64%   to demonstrate statistically significant improvement in mobility and quality of life.  Baseline: 2/21: 54% 8/22: 52% 10/17: 57% 11/14: 53% 1/16: 47% Goal status: terminated  2.  Patient will increase Berg Balance score by > 6 points (42/56)to demonstrate decreased fall risk during functional activities. Baseline: 2/21: 36/56 5/15: 45   Goal status: MET  3.  Patient will increase six minute walk test distance to >1500 for progression to age norm community ambulator and improve gait ability Baseline: 2/21: 1200 ft with [facility] 5/15: 1300 ft with [facility]  8/22: 905 ft with [facility] 10/17: 1100 ft with [facility] 11/14: 1110 ft with [facility] 1/16: 1210 ft with [facility] 3/6: 1100 ft with two canes Goal status: In progress  4.  Patient will increase BLE gross strength to 4+/5  or within 1 lb of each LE as to improve functional strength for independent gait, increased standing tolerance and increased ADL ability. Baseline: 2/21 :see above 5/15: see above 10/17: RLE 5/5 LLE 4/5 L ankle 3/5  11/14: RLE 5/5 LLE 4+/5 hamstring 4/5, ankle: 3+/5  1/16: RLE 5/5 LLE 4+/5 ankle 4-/5  3/6: see above  Goal status:  In progress / modified  6.  Patient will increase Berg Balance score by > 6 points (51/56) to demonstrate decreased fall risk during functional activities. Baseline:  5/15: 45 8/22: 41/56 10/17: 28 11/14: 36 1/16: 36 3/6: 25 Goal status: Ongoing  7.   Patient will increase UE strength to >4+/5 for ADLs, AD usage, and quality of life.  Baseline: 5/15: see above 8/22: see above 11/14: >4+/5 bilaterally  Goal status: MET  8.   Patient will increase lower extremity functional scale to >60/80 to demonstrate improved functional mobility and increased tolerance with ADLs.  Baseline: 11/14: 29%  1/16: 29%  3/6: 19%  Goal status: ongoing   ASSESSMENT:  CLINICAL IMPRESSION: ***  Patient will benefit from skilled physical therapy to improve strength, mobility, and stability for improved quality of life.   OBJECTIVE IMPAIRMENTS: Abnormal gait, decreased activity tolerance, decreased balance, decreased coordination, decreased endurance, decreased mobility, difficulty walking, decreased ROM, decreased strength, hypomobility, impaired flexibility, improper body mechanics, and postural dysfunction.   ACTIVITY LIMITATIONS: carrying, lifting, bending, standing, squatting, stairs, transfers, reach over head, locomotion level, and caring for others  PARTICIPATION LIMITATIONS: meal prep, cleaning, laundry, personal finances, driving, shopping, community activity, and yard work  PERSONAL FACTORS: Age, Past/current experiences, Time since onset of injury/illness/exacerbation, and 3+ comorbidities: DHD, arthritis, concussion COVID, HTN, multiple lacunar infarcts, MVP, pernicious anemia, rocky mountain spotted fever, spinal stenosis, vestibular migraine, vitamin B deficiency.   are also affecting patient's functional outcome.   REHAB POTENTIAL: Good  CLINICAL DECISION MAKING: Evolving/moderate complexity  EVALUATION COMPLEXITY: Moderate  PLAN:   PT FREQUENCY: 1x/week  PT  DURATION: 12 weeks  PLANNED INTERVENTIONS: Therapeutic exercises, Therapeutic activity, Neuromuscular re-education, Balance training, Gait training, Patient/Family education, Self Care, Joint mobilization, Stair training, Vestibular training, Canalith repositioning, Visual/preceptual remediation/compensation, Orthotic/Fit training, DME instructions, Cognitive remediation, Electrical stimulation, Spinal mobilization, Cryotherapy, Moist heat, Splintting, Taping, Traction, Ultrasound, Manual therapy, and Re-evaluation  PLAN FOR NEXT SESSION:  balance, strength, unstable surfaces    Precious Bard, PT, DPT Physical Therapist - [facility]  Outpatient Physical Therapy- Main Campus [phone]

## 2023-11-28 ENCOUNTER — Ambulatory Visit: Admitting: Occupational Therapy

## 2023-11-28 ENCOUNTER — Ambulatory Visit: Payer: 59

## 2023-11-28 ENCOUNTER — Encounter: Payer: Self-pay | Admitting: Occupational Therapy

## 2023-11-28 DIAGNOSIS — R278 Other lack of coordination: Secondary | ICD-10-CM

## 2023-11-28 DIAGNOSIS — F0670 Mild neurocognitive disorder due to known physiological condition without behavioral disturbance: Secondary | ICD-10-CM | POA: Diagnosis not present

## 2023-11-28 DIAGNOSIS — M6281 Muscle weakness (generalized): Secondary | ICD-10-CM

## 2023-11-28 DIAGNOSIS — R2681 Unsteadiness on feet: Secondary | ICD-10-CM

## 2023-11-28 DIAGNOSIS — R262 Difficulty in walking, not elsewhere classified: Secondary | ICD-10-CM | POA: Diagnosis not present

## 2023-11-28 DIAGNOSIS — R4681 Obsessive-compulsive behavior: Secondary | ICD-10-CM | POA: Diagnosis not present

## 2023-11-28 DIAGNOSIS — F840 Autistic disorder: Secondary | ICD-10-CM | POA: Diagnosis not present

## 2023-11-28 NOTE — Therapy (Signed)
 OUTPATIENT OCCUPATIONAL THERAPY NEURO TREATMENT NOTE  Patient Name: [name] MRN: [id number] DOB:10-14-1960, 63 y.o., male Today's Date: 11/28/2023  PCP: Larae Grooms, NP REFERRING PROVIDER: Lonell Face, MD  END OF SESSION:  OT End of Session - 11/28/23 1325     Visit Number 4    Number of Visits 24    Date for OT Re-Evaluation 01/30/24    OT Start Time 1325    OT Stop Time 1400    OT Time Calculation (min) 35 min    Activity Tolerance Patient tolerated treatment well    Behavior During Therapy [facility] for tasks assessed/performed              Past Medical History:  Diagnosis Date   ADHD    Arthritis    Concussion    COVID 12/2020   Hypertension    Multiple lacunar infarcts (HCC)    MVP (mitral valve prolapse)    Pernicious anemia    [facility] spotted fever    Spinal stenosis    Vestibular migraine    Vitamin B12 deficiency    Vitamin D deficiency    Past Surgical History:  Procedure Laterality Date   ANTERIOR CERVICAL DECOMP/DISCECTOMY FUSION N/A 07/30/2022   Procedure: C4-7 ANTERIOR CERVICAL DISCECTOMY AND FUSION (GLOBUS HEDRON);  Surgeon: Venetia Night, MD;  Location: [facility] ORS;  Service: Neurosurgery;  Laterality: N/A;   BILATERAL CARPAL TUNNEL RELEASE     CERVICAL WOUND DEBRIDEMENT N/A 08/01/2022   Procedure: CERVICAL WOUND DEBRIDEMENT OF HEMATOMA;  Surgeon: Venetia Night, MD;  Location: [facility] ORS;  Service: Neurosurgery;  Laterality: N/A;   FRACTURE SURGERY Left 1984   intramedullary rod   hip bone transplant  1984   LEG SURGERY Left    oseotomy   REPAIR ANKLE LIGAMENT Right 1978   REPAIR KNEE LIGAMENT Bilateral    acl   TRIGGER FINGER RELEASE Bilateral    thumbs   Patient Active Problem List   Diagnosis Date Noted   Dizziness 10/30/2023   Postoperative hematoma of musculoskeletal structure following musculoskeletal procedure 08/01/2022   Cervical myelopathy (HCC) 07/30/2022   Cervical spinal stenosis 07/30/2022    Radiculopathy, cervical 07/30/2022   S/P cervical spinal fusion 07/30/2022   Vitamin D deficiency 06/25/2022   B12 deficiency 06/25/2022   Elevated LDL cholesterol level 06/25/2022   Prediabetes 03/27/2021   History of nausea- reported 01/27/2020 02/02/2020   Accidental fall from ladder 02/02/2020   Acute right-sided thoracic back pain 02/02/2020   Neck stiffness 02/02/2020   Concussion with no loss of consciousness 02/02/2020   Multiple falls 02/02/2020   Head trauma, initial encounter 02/02/2020   Acute post-traumatic headache, not intractable 02/02/2020   Essential hypertension 08/26/2018   Pre-bariatric surgery nutrition evaluation 06/19/2018   Migraine with vertigo 01/05/2014   Arthralgia of multiple joints 04/28/2009   Carpal tunnel syndrome 08/30/2008    ONSET DATE: 07/30/2022  REFERRING DIAG: ACDF, Hx of CVAs  THERAPY DIAG:  Muscle weakness (generalized)  Other lack of coordination  Rationale for Evaluation and Treatment: Rehabilitation  SUBJECTIVE:   SUBJECTIVE STATEMENT: Pt. Arrived 10 minutes late    Pt accompanied by: self  PERTINENT HISTORY: Pt. underwent an ACDF C4-C7. L5-S1 anterolithesis, and disc degeneration causing marked biforminal L5 impingement, Spinal stenosis from L1-2 to L3-4 with L2-3 being the most advanced. Bilateral hand tremors. Bilateral Lacunar infarcts in 2020. See above list for PMHx .  PRECAUTIONS: None  WEIGHT BEARING RESTRICTIONS: No  PAIN:  Are you having pain? None  reported  FALLS: Has patient fallen in last 6 months? Yes. Number of falls 6-10 falls  LIVING ENVIRONMENT: Lives with: lives with their spouse Lives in: House/apartment 2 levels Stairs: 4-5 steps. Has following equipment at home: Single point cane, Walker - 2 wheeled, and shower chair  PLOF: Independent  PATIENT GOALS:  To improve bilateral hand cooridnation  OBJECTIVE:  Note: Objective measures were completed at Evaluation unless otherwise noted.  HAND  DOMINANCE: Right hand dominant  ADLs:  Transfers/ambulation related to ADLs: Eating: feels like hand, and mouth are not working together, drinks drip out of mouth, Difficulty using fork, and knife together for self-feeding. Grooming: Independent brushing teeth UB Dressing: Difficulty buttoning LB Dressing: Difficulty managing pants, shoes, and socks Toileting: Independent Bathing: incoordination with applying shampoo with bilateral hands Tub Shower transfers: Distant supervision   IADLs: Shopping: Delivery Light housekeeping: Difficulty with vacuuming-bending over Meal Prep:  Independent Community mobility:  Independent Medication management: Wife sets up pillbox-Pt. Takes pills independently  Financial management: No changes Handwriting: 75% legible  MOBILITY STATUS: Hx of falls  POSTURE COMMENTS:   Sitting balance: good  ACTIVITY TOLERANCE: Activity tolerance: Fair   UPPER EXTREMITY ROM:    Active ROM Right Eval WFL Left Eval WFL  (Blank rows = not tested)  UPPER EXTREMITY MMT:     MMT Right Eval  Left Eval   Shoulder flexion 4+/5 4+/5  Shoulder abduction 4+/5 4+/5  Shoulder adduction    Shoulder extension    Shoulder internal rotation    Shoulder external rotation    Middle trapezius    Lower trapezius    Elbow flexion 5/5 5/5  Elbow extension 5/5 5/5  Wrist flexion    Wrist extension 5/5 5/5  Wrist ulnar deviation    Wrist radial deviation    Wrist pronation    Wrist supination    (Blank rows = not tested)  HAND FUNCTION: Grip strength: Right: 48 lbs; Left: 44 lbs, Lateral pinch: Right: 17 lbs, Left: 16 lbs, and 3 point pinch: Right: 12 lbs, Left: 19 lbs  COORDINATION: 9 Hole Peg test: Right: 25 sec; Left: 26 sec  SENSATION:  Pt. reports no changes  EDEMA: N/A  MUSCLE TONE: Intact;  Atrophy at the right dorsal interossei muscles.  COGNITION: Overall cognitive status: WFL for tasks performed  VISION:  Wears glasses. No change from  baseline   PERCEPTION: intact   OBSERVATIONS: Pt. Left his 2 wheeled scooter in the waiting/reception area                                                                                                                             TREATMENT DATE:  11/28/23  Neuromuscular Re-education: - Pt worked on [facility] skills grasping small 1/4" pegs, washers, and hollow dowels from [employer] pegboard. Focused on performing translatory movements moving each peg through the hand from the palm to the tip of the 2nd digit and thumb in preparation for placing them in  the pegboard following a design pattern. Requires bringing washers to edge of table prior to grasping.  -Reports increased tremor with dual tasking - increased dropping errors to complete while maintaining conversation   Therapeutic Activities: -Pt worked on grasping washers of various sizes from magnetic bowl and threaded onto large string using R hand to grasp, L hand provided MIN assist to hold washer while R hand threaded string twice to form loop knot.  -Pt tied fishermans knot with increased time and mild RUE tremor   Self Care -simulated cutting meat with blue theraputty, required increased time and reports fatigue maintaining adequate pressure on fork to prevent food from "sliding away."   PATIENT EDUCATION: Education details: OT services, POC, goals, ADL/IADL functional status. Person educated: Patient Education method: Medical illustrator Education comprehension: verbalized understanding and returned demonstration  HOME EXERCISE PROGRAM: To be assessed, and provided as indicated   GOALS: Goals reviewed with patient? Yes  SHORT TERM GOALS: Target date: 12/19/2023  Pt. Will be independent with HEPs for BUEs  Baseline: Eval: No current HEP Goal status: INITIAL  LONG TERM GOALS: Target date: 01/30/2024  Pt. Will perform buttoning with modified independence Baseline: Eval: Buttoning is difficult Goal status:  INITIAL  2.  Pt. Will be able to cut food with Modified independence Baseline: Eval: Pt. Has difficulty using bilateral hand ts cut food. Goal status: INITIAL  3.  Pt. Will improve bilateral UE strength by 2 mm grades to be able to sustain bilateral UEs in elevation while coordinating bilateral hands for shampooing. Baseline: Eval: Pt. Has difficulty coordinating UEs during shampooing. Goal status: INITIAL  4.  Pt. Will demonstrate compensatory/adaptive equipment strategies for LE dressing tasks.  Baseline: Eval:Pt. Has difficulty performing LE dressing   Goal status: INITIAL  5.  Pt. Will identify 2 compensatory strategies for bilateral tremors during ADLs/IADLs Baseline: Eval: Education to be provided Goal status: INITIAL  ASSESSMENT:  CLINICAL IMPRESSION:  Pt. presents with improved dexterity performing translatory skills, increased tremor with subsequent dropping errors if dual tasking to maintain conversation. Completed simulated meat cutting using blue theraputty, mildly increased time only. Pt. requires verbal cues, and cues for visual demonstration for the proper technique of the hand strengthening exercises with theraputty, the gross grip strengthener, and the resistive clips. Pt. will benefit from OT services to work on improving BUE functioning, and maximize independence with ADLs, and IADL tasks.   PERFORMANCE DEFICITS: in functional skills including ADLs, IADLs, coordination, dexterity, ROM, strength, pain, Fine motor control, Gross motor control, and UE functional use, cognitive skills including , and psychosocial skills including coping strategies, environmental adaptation, and routines and behaviors.   IMPAIRMENTS: are limiting patient from ADLs, IADLs, and leisure.   CO-MORBIDITIES: may have co-morbidities  that affects occupational performance. Patient will benefit from skilled OT to address above impairments and improve overall function.  MODIFICATION OR ASSISTANCE TO  COMPLETE EVALUATION: Min-Moderate modification of tasks or assist with assess necessary to complete an evaluation.  OT OCCUPATIONAL PROFILE AND HISTORY: Detailed assessment: Review of records and additional review of physical, cognitive, psychosocial history related to current functional performance.  CLINICAL DECISION MAKING: Moderate - several treatment options, min-mod task modification necessary  REHAB POTENTIAL: Good  EVALUATION COMPLEXITY: Moderate    PLAN:  OT FREQUENCY: 2x/month  OT DURATION: 12 weeks  PLANNED INTERVENTIONS: 97535 self care/ADL training, [address] therapeutic exercise, 97530 therapeutic activity, 97112 neuromuscular re-education, 97140 manual therapy, 97018 paraffin, 60454 moist heat, 97034 contrast bath, energy conservation, patient/family education, and DME  and/or AE instructions  RECOMMENDED OTHER SERVICES: PT  CONSULTED AND AGREED WITH PLAN OF CARE: Patient  PLAN FOR NEXT SESSION: Treatment  Kathie Dike, M.S. OTR/L  11/28/23, 1:25 PM  ascom [phone]  11/28/2023, 1:25 PM

## 2023-12-03 ENCOUNTER — Ambulatory Visit (INDEPENDENT_AMBULATORY_CARE_PROVIDER_SITE_OTHER)

## 2023-12-03 DIAGNOSIS — Z23 Encounter for immunization: Secondary | ICD-10-CM

## 2023-12-03 DIAGNOSIS — Z789 Other specified health status: Secondary | ICD-10-CM

## 2023-12-03 NOTE — Progress Notes (Signed)
 Patient is in office today for a nurse visit for MMR Immunization. Patient Injection was given in the  Left deltoid. Patient tolerated injection well.

## 2023-12-05 ENCOUNTER — Ambulatory Visit: Payer: 59

## 2023-12-05 ENCOUNTER — Ambulatory Visit: Admitting: Occupational Therapy

## 2023-12-05 DIAGNOSIS — F840 Autistic disorder: Secondary | ICD-10-CM | POA: Diagnosis not present

## 2023-12-05 DIAGNOSIS — R4681 Obsessive-compulsive behavior: Secondary | ICD-10-CM | POA: Diagnosis not present

## 2023-12-05 DIAGNOSIS — F0670 Mild neurocognitive disorder due to known physiological condition without behavioral disturbance: Secondary | ICD-10-CM | POA: Diagnosis not present

## 2023-12-10 ENCOUNTER — Encounter: Payer: Self-pay | Admitting: Nurse Practitioner

## 2023-12-11 ENCOUNTER — Encounter: Payer: Self-pay | Admitting: Internal Medicine

## 2023-12-11 ENCOUNTER — Ambulatory Visit: Payer: 59 | Attending: Internal Medicine | Admitting: Internal Medicine

## 2023-12-11 VITALS — BP 156/102 | HR 73 | Ht 67.0 in | Wt 143.0 lb

## 2023-12-11 DIAGNOSIS — I10 Essential (primary) hypertension: Secondary | ICD-10-CM | POA: Diagnosis not present

## 2023-12-11 DIAGNOSIS — I639 Cerebral infarction, unspecified: Secondary | ICD-10-CM

## 2023-12-11 DIAGNOSIS — I6381 Other cerebral infarction due to occlusion or stenosis of small artery: Secondary | ICD-10-CM | POA: Diagnosis not present

## 2023-12-11 DIAGNOSIS — G250 Essential tremor: Secondary | ICD-10-CM

## 2023-12-11 MED ORDER — HYDROCHLOROTHIAZIDE 12.5 MG PO CAPS: 12.5000 mg | ORAL_CAPSULE | Freq: Every day | ORAL | 3 refills | Status: AC

## 2023-12-11 NOTE — Patient Instructions (Signed)
 Medication Instructions:  Your physician recommends the following medication changes.  STOP TAKING: Propranolol  10mg   START TAKING: Hydrochlorothiazide  12.5 mg daily   *If you need a refill on your cardiac medications before your next appointment, please call your pharmacy*  Lab Work: Your provider would like for you to return in 1 month to have the following labs drawn: BMP.   Please go to [facility] [address] ([facility]) [address], [place] [address] You do not need an appointment.  They are open from 8 am- 4:30 pm.  Lunch from 1:00 pm- 2:00 pm You do not need to be fasting.   You may also go to one of the following [facility]:  [address] [place], [place] [address] Phone: [phone] Lab hours: Mon-Fri 8 am- 5 pm    Lunch 12 pm- 1 pm  [address] [place],  [place]  [address]  [place]  Phone: [phone] Lab hours: 7 am- 4 pm Lunch 12 pm-1 pm   [address] [place],  [place]  [address]  [place]  Phone: [phone] Lab hours: Mon-Fri 8 am- 5 pm    Lunch 12 pm- 1 pm  If you have labs (blood work) drawn today and your tests are completely normal, you will receive your results only by: MyChart Message (if you have MyChart) OR A paper copy in the mail If you have any lab test that is abnormal or we need to change your treatment, we will call you to review the results.  Testing/Procedures: Your physician has requested that you have an echocardiogram. Echocardiography is a painless test that uses sound waves to create images of your heart. It provides your doctor with information about the size and shape of your heart and how well your heart's chambers and valves are working.   You may receive an ultrasound enhancing agent through an IV if needed to better visualize your heart during the echo. This procedure takes approximately one hour.  There are no restrictions for this procedure.  This will take place at [address] [facility])  [address], [place] [address]  Please note: We ask at that you not bring children with you during ultrasound (echo/ vascular) testing. Due to room size and safety concerns, children are not allowed in the ultrasound rooms during exams. Our front office staff cannot provide observation of children in our lobby area while testing is being conducted. An adult accompanying a patient to their appointment will only be allowed in the ultrasound room at the discretion of the ultrasound technician under special circumstances. We apologize for any inconvenience.   Your physician has requested that you have a renal artery duplex. During this test, an ultrasound is used to evaluate blood flow to the kidneys. Take your medications as you usually do. This will take place at [address] [facility]) [address], [place] [address].  No food after 11PM the night before.  Water  is OK. (Don't drink liquids if you have been instructed not to for ANOTHER test). Avoid foods that produce bowel gas, for 24 hours prior to exam (see below). No breakfast, no chewing gum, no smoking or carbonated beverages. Patient may take morning medications with water . Come in for test at least 15 minutes early to register.  Please note: We ask at that you not bring children with you during ultrasound (echo/ vascular) testing. Due to room size and safety concerns, children are not allowed in the ultrasound rooms during exams. Our front office staff cannot  provide observation of children in our lobby area while testing is being conducted. An adult accompanying a patient to their appointment will only be allowed in the ultrasound room at the discretion of the ultrasound technician under special circumstances. We apologize for any inconvenience.  Your physician has requested that you have a carotid duplex. This test is an ultrasound of the carotid arteries in your neck. It looks at blood flow through these arteries that supply the brain with  blood.   Allow one hour for this exam.  There are no restrictions or special instructions.  This will take place at [address] [facility]) [address], [place] [address]  Please note: We ask at that you not bring children with you during ultrasound (echo/ vascular) testing. Due to room size and safety concerns, children are not allowed in the ultrasound rooms during exams. Our front office staff cannot provide observation of children in our lobby area while testing is being conducted. An adult accompanying a patient to their appointment will only be allowed in the ultrasound room at the discretion of the ultrasound technician under special circumstances. We apologize for any inconvenience.    Follow-Up: At [facility], you and your health needs are our priority.  As part of our continuing mission to provide you with exceptional heart care, our providers are all part of one team.  This team includes your primary Cardiologist (physician) and Advanced Practice Providers or APPs (Physician Assistants and Nurse Practitioners) who all work together to provide you with the care you need, when you need it.  Your next appointment:   6 week(s)  Provider:   You may see Sammy Crisp, MD or one of the following Advanced Practice Providers on your designated Care Team:   Laneta Pintos, NP Gildardo Labrador, PA-C Varney Gentleman, PA-C Cadence Glen [place], PA-C Ronald Cockayne, NP Morey Ar, NP

## 2023-12-11 NOTE — Progress Notes (Unsigned)
 Cardiology Office Note:  .   Date:  12/12/2023  ID:  [name], DOB 07/17/1961, MRN [id number] PCP: [name] Alexanders, NP  [facility] Providers Cardiologist:  Sammy Crisp, MD     History of Present Illness: [name]   [name] is a 63 y.o. male with history of mitral valve prolapse, lacunar infarcts, hypertension, Rocky mounted spotted fever, and spinal stenosis, who has been referred for evaluation of hypertension.  He was previously seen by nephrology due to hypertension and elevated renin levels.  He was switched from lisinopril  to amlodipine  with normalization of his renin level.  [name] and his wife report that he was first noted to have elevated blood pressure 15-20 years ago but was hesitant to begin pharmacotherapy for this until ~2 years ago.  He was on lisinopril  with good BP control but had to taken off of this due to persistent cough and abnormal renin levels.  He notes that his cough has resolved after being switched to amlodipine , though his blood pressure control has not been as good.  He was also recently started on propranolol  by his neurologist for treatment of essential tremor as well as BP control.  Since starting propranolol  ~2 weeks ago, [name] has noticed changes in his skin, particularly his scalp, as well as an unpleasant body odor.  [name] was seen in the ED last month due to severely elevated blood pressure.  He had developed a vestibular migraine manifesting as sudden dizziness and checked his blood pressure at home and noted it to be very elevated (~200/100).  In the ED, BP gradually trended down.  Brain MRI showed findings of chronic small vessel ischemia but no acute intracranial abnormality.  ROS: See HPI  Studies Reviewed: [name]   EKG Interpretation Date/Time:  Wednesday December 11 2023 09:53:49 EDT Ventricular Rate:  73 PR Interval:  150 QRS Duration:  88 QT Interval:  398 QTC Calculation: 438 R Axis:   58  Text  Interpretation: Normal sinus rhythm Normal ECG When compared with ECG of 29-Oct-2023 21:11, No significant change was found Confirmed by Arionne Iams, Veryl Gottron [phone]) on 12/11/2023 10:23:22 AM    Risk Assessment/Calculations:         Physical Exam:   VS:  BP (!) 156/102   Pulse 73   Ht 5\' 7"  (1.702 m)   Wt 143 lb (64.9 kg)   SpO2 97%   BMI 22.40 kg/m    Wt Readings from Last 3 Encounters:  12/11/23 143 lb (64.9 kg)  11/18/23 142 lb 12.8 oz (64.8 kg)  10/30/23 144 lb 3.2 oz (65.4 kg)    General:  NAD.  Accompanied by his wife, who is also a patient of mine. Neck: No JVD or HJR. Lungs: Clear to auscultation bilaterally without wheezes or crackles. Heart: Regular rate and rhythm without murmurs, rubs, or gallops. Abdomen: Soft, nontender, nondistended. Extremities: No lower extremity edema.  ASSESSMENT AND PLAN: .    Uncontrolled hypertension: [name] has a long history of hypertension, though this remained untreated until ~2 years ago.  His BP today remains suboptimally controlled.  Lisinopril  previously led to good BP control but was stopped due to cough and elevated renin levels.  Due to unusual side effects of propranolol  (scalp dryness/itching and unpleasant body odor), we will stop this medication to see if symptoms resolved.  We will continue amlodipine  5 mg daily and start hydrochlorothiazide  12.5 mg daily.  We will also obtain a renal artery Doppler to exclude  renovascular hypertension, as elevated renin levels can be seen in this setting as well.  I will check a BMP in ~1 month to ensure stable renal function and electrolytes.  Lacunar strokes: Most likely driven by longstanding uncontrolled hypertension.  However, I think it would be prudent to obtain an echo to exclude contributing structural heart abnormalities as well as to perform carotid Dopplers.  Continue aspirin 81 mg daily.  LDL minimally elevated at 161 on last check in 06/2023.  We will readdress role for statin  therapy with MRI evidence of prior lacunar infarcts at follow-up.  Essential tremor: Improved with propranolol , though [name] is bothered by aforementioned side-effects.  We will hold propranolol  for now to see if his side-effects resolve.  If so, he will need to speak with his neurologist about potential alternatives for treatment of his tremor.    Dispo: Return to clinic in 6 weeks.  Signed, Sammy Crisp, MD

## 2023-12-12 ENCOUNTER — Ambulatory Visit: Admitting: Occupational Therapy

## 2023-12-12 ENCOUNTER — Ambulatory Visit: Payer: 59

## 2023-12-12 ENCOUNTER — Encounter: Payer: Self-pay | Admitting: Internal Medicine

## 2023-12-12 DIAGNOSIS — F0670 Mild neurocognitive disorder due to known physiological condition without behavioral disturbance: Secondary | ICD-10-CM | POA: Diagnosis not present

## 2023-12-12 DIAGNOSIS — M6281 Muscle weakness (generalized): Secondary | ICD-10-CM | POA: Diagnosis not present

## 2023-12-12 DIAGNOSIS — R278 Other lack of coordination: Secondary | ICD-10-CM | POA: Diagnosis not present

## 2023-12-12 DIAGNOSIS — I6381 Other cerebral infarction due to occlusion or stenosis of small artery: Secondary | ICD-10-CM | POA: Insufficient documentation

## 2023-12-12 DIAGNOSIS — F840 Autistic disorder: Secondary | ICD-10-CM | POA: Diagnosis not present

## 2023-12-12 DIAGNOSIS — R2681 Unsteadiness on feet: Secondary | ICD-10-CM | POA: Diagnosis not present

## 2023-12-12 DIAGNOSIS — R262 Difficulty in walking, not elsewhere classified: Secondary | ICD-10-CM | POA: Diagnosis not present

## 2023-12-12 DIAGNOSIS — G250 Essential tremor: Secondary | ICD-10-CM | POA: Insufficient documentation

## 2023-12-12 DIAGNOSIS — R4681 Obsessive-compulsive behavior: Secondary | ICD-10-CM | POA: Diagnosis not present

## 2023-12-12 NOTE — Therapy (Signed)
 OUTPATIENT OCCUPATIONAL THERAPY NEURO TREATMENT NOTE  Patient Name: [name] MRN: [id number] DOB:12/16/1960, 63 y.o., male Today's Date: 12/12/2023  PCP: Aileen Alexanders, NP REFERRING PROVIDER: Devora Folks, Linnell Richardson, MD  END OF SESSION:  OT End of Session - 12/12/23 1713     Visit Number 5    Number of Visits 24    Date for OT Re-Evaluation 01/30/24    OT Start Time 1315    OT Stop Time 1400    OT Time Calculation (min) 45 min    Activity Tolerance Patient tolerated treatment well    Behavior During Therapy [facility] for tasks assessed/performed              Past Medical History:  Diagnosis Date   ADHD    Arthritis    Concussion    COVID 12/2020   Hypertension    Multiple lacunar infarcts (HCC)    MVP (mitral valve prolapse)    Pernicious anemia    [facility] spotted fever    Spinal stenosis    Vestibular migraine    Vitamin B12 deficiency    Vitamin D  deficiency    Past Surgical History:  Procedure Laterality Date   ANTERIOR CERVICAL DECOMP/DISCECTOMY FUSION N/A 07/30/2022   Procedure: C4-7 ANTERIOR CERVICAL DISCECTOMY AND FUSION (GLOBUS HEDRON);  Surgeon: Jodeen Munch, MD;  Location: [facility] ORS;  Service: Neurosurgery;  Laterality: N/A;   BILATERAL CARPAL TUNNEL RELEASE     CERVICAL WOUND DEBRIDEMENT N/A 08/01/2022   Procedure: CERVICAL WOUND DEBRIDEMENT OF HEMATOMA;  Surgeon: Jodeen Munch, MD;  Location: [facility] ORS;  Service: Neurosurgery;  Laterality: N/A;   FRACTURE SURGERY Left 1984   intramedullary rod   hip bone transplant  1984   LEG SURGERY Left    oseotomy   REPAIR ANKLE LIGAMENT Right 1978   REPAIR KNEE LIGAMENT Bilateral    acl   TRIGGER FINGER RELEASE Bilateral    thumbs   Patient Active Problem List   Diagnosis Date Noted   Lacunar stroke (HCC) 12/12/2023   Essential tremor 12/12/2023   Dizziness 10/30/2023   Postoperative hematoma of musculoskeletal structure following musculoskeletal procedure 08/01/2022   Cervical  myelopathy (HCC) 07/30/2022   Cervical spinal stenosis 07/30/2022   Radiculopathy, cervical 07/30/2022   S/P cervical spinal fusion 07/30/2022   Vitamin D  deficiency 06/25/2022   B12 deficiency 06/25/2022   Elevated LDL cholesterol level 06/25/2022   Prediabetes 03/27/2021   History of nausea- reported 01/27/2020 02/02/2020   Accidental fall from ladder 02/02/2020   Acute right-sided thoracic back pain 02/02/2020   Neck stiffness 02/02/2020   Concussion with no loss of consciousness 02/02/2020   Multiple falls 02/02/2020   Head trauma, initial encounter 02/02/2020   Acute post-traumatic headache, not intractable 02/02/2020   Essential hypertension 08/26/2018   Pre-bariatric surgery nutrition evaluation 06/19/2018   Migraine with vertigo 01/05/2014   Arthralgia of multiple joints 04/28/2009   Carpal tunnel syndrome 08/30/2008    ONSET DATE: 07/30/2022  REFERRING DIAG: ACDF, Hx of CVAs  THERAPY DIAG:  Muscle weakness (generalized)  Rationale for Evaluation and Treatment: Rehabilitation  SUBJECTIVE:   SUBJECTIVE STATEMENT: Pt. Reports that if either one of his therapists (OT/PT) are out, he will plan to cancel both appointments for the day, and resume therapy on the next scheduled visit.  Pt accompanied by: self  PERTINENT HISTORY: Pt. underwent an ACDF C4-C7. L5-S1 anterolithesis, and disc degeneration causing marked biforminal L5 impingement, Spinal stenosis from L1-2 to L3-4 with L2-3 being the most advanced. Bilateral  hand tremors. Bilateral Lacunar infarcts in 2020. See above list for PMHx .  PRECAUTIONS: None  WEIGHT BEARING RESTRICTIONS: No  PAIN:  Are you having pain? None reported  FALLS: Has patient fallen in last 6 months? Yes. Number of falls 6-10 falls  LIVING ENVIRONMENT: Lives with: lives with their spouse Lives in: House/apartment 2 levels Stairs: 4-5 steps. Has following equipment at home: Single point cane, Walker - 2 wheeled, and shower  chair  PLOF: Independent  PATIENT GOALS:  To improve bilateral hand cooridnation  OBJECTIVE:  Note: Objective measures were completed at Evaluation unless otherwise noted.  HAND DOMINANCE: Right hand dominant  ADLs:  Transfers/ambulation related to ADLs: Eating: feels like hand, and mouth are not working together, drinks drip out of mouth, Difficulty using fork, and knife together for self-feeding. Grooming: Independent brushing teeth UB Dressing: Difficulty buttoning LB Dressing: Difficulty managing pants, shoes, and socks Toileting: Independent Bathing: incoordination with applying shampoo with bilateral hands Tub Shower transfers: Distant supervision   IADLs: Shopping: Delivery Light housekeeping: Difficulty with vacuuming-bending over Meal Prep:  Independent Community mobility:  Independent Medication management: Wife sets up pillbox-Pt. Takes pills independently  Financial management: No changes Handwriting: 75% legible  MOBILITY STATUS: Hx of falls  POSTURE COMMENTS:   Sitting balance: good  ACTIVITY TOLERANCE: Activity tolerance: Fair   UPPER EXTREMITY ROM:    Active ROM Right Eval WFL Left Eval WFL  (Blank rows = not tested)  UPPER EXTREMITY MMT:     MMT Right Eval  Left Eval   Shoulder flexion 4+/5 4+/5  Shoulder abduction 4+/5 4+/5  Shoulder adduction    Shoulder extension    Shoulder internal rotation    Shoulder external rotation    Middle trapezius    Lower trapezius    Elbow flexion 5/5 5/5  Elbow extension 5/5 5/5  Wrist flexion    Wrist extension 5/5 5/5  Wrist ulnar deviation    Wrist radial deviation    Wrist pronation    Wrist supination    (Blank rows = not tested)  HAND FUNCTION: Grip strength: Right: 48 lbs; Left: 44 lbs, Lateral pinch: Right: 17 lbs, Left: 16 lbs, and 3 point pinch: Right: 12 lbs, Left: 19 lbs  COORDINATION: 9 Hole Peg test: Right: 25 sec; Left: 26 sec  SENSATION:  Pt. reports no  changes  EDEMA: N/A  MUSCLE TONE: Intact;  Atrophy at the right dorsal interossei muscles.  COGNITION: Overall cognitive status: WFL for tasks performed  VISION:  Wears glasses. No change from baseline   PERCEPTION: intact   OBSERVATIONS: Pt. Left his 2 wheeled scooter in the waiting/reception area                                                                                                                             TREATMENT DATE:  12/12/23  Therapeutic Ex.:   -UE strengthening through reciprocal motion using the SciFit for 8 min. On level 4.0 with  constant monitoring of the BUEs. Pt. worked on changing, and alternating forward reverse position.   Neuromuscular Re-education:  -Facilitated [facility] skills grasping 1" sticks, 1/4" collars, and 1/4" washers.  -Translatory skills using the hand to store items in the palm, then progressing towards moving the items from the palm of the hand to the tip of the 2nd digit, and thumb in preparation for discarding them. -Bilateral alternating hand patterns to remove the 1" sticks with progressively increasing speed.  Therapeutic Activities:  -Lateral, and 3pt. pinch strengthening using yellow, red, green, and blue, and black level resistive clips -Facilitated translatory movements moving clips from the lateral pinch position to the 3pt. pinch position in preparation for securely placing them on the dowel.   PATIENT EDUCATION: Education details: OT services, POC, goals, ADL/IADL functional status. Person educated: Patient Education method: Medical illustrator Education comprehension: verbalized understanding and returned demonstration  HOME EXERCISE PROGRAM: To be assessed, and provided as indicated   GOALS: Goals reviewed with patient? Yes  SHORT TERM GOALS: Target date: 12/19/2023  Pt. Will be independent with HEPs for BUEs  Baseline: Eval: No current HEP Goal status: INITIAL  LONG TERM GOALS: Target date:  01/30/2024  Pt. Will perform buttoning with modified independence Baseline: Eval: Buttoning is difficult Goal status: INITIAL  2.  Pt. Will be able to cut food with Modified independence Baseline: Eval: Pt. Has difficulty using bilateral hand ts cut food. Goal status: INITIAL  3.  Pt. Will improve bilateral UE strength by 2 mm grades to be able to sustain bilateral UEs in elevation while coordinating bilateral hands for shampooing. Baseline: Eval: Pt. Has difficulty coordinating UEs during shampooing. Goal status: INITIAL  4.  Pt. Will demonstrate compensatory/adaptive equipment strategies for LE dressing tasks.  Baseline: Eval:Pt. Has difficulty performing LE dressing   Goal status: INITIAL  5.  Pt. Will identify 2 compensatory strategies for bilateral tremors during ADLs/IADLs Baseline: Eval: Education to be provided Goal status: INITIAL  ASSESSMENT:  CLINICAL IMPRESSION:  Upon arrival Pt. Inquired about whether or not knitting would be beneficial for working on bilateral hand coordination. Pt. Had received knitting lessons, and supplies from his mother. Pt. was assured that knitting would be a a great task to focus on bilateral hand coordination skills at home. Pt. Education was provided about proximal UE support, as well as resting his hand, and mind at times when needed. Pt. is planning to bring knitting items in next visit to assess bilateral hand movements during the task, and to make recommendations for hand position,  bilateral hand function, as well as wrist, hand, and digit alignment as needed during the task. Pt. Was able to efficiently perform translatory movements with the clips moving the resistive clips form one pinch position to the next without difficulty.  Pt. Presented with more difficulty moving the small 1/4" Purdue collars, and 1/4" washers through his hand from his palm to the very tip of his 2nd digit and thumb. Pt. Was able to grasp, and store the small collars, as  well as drop them one at a time from the ulnar aspect of his hand. Pt. continues to  benefit from OT services to work on improving BUE functioning, and maximize independence with ADLs, and IADL tasks.   PERFORMANCE DEFICITS: in functional skills including ADLs, IADLs, coordination, dexterity, ROM, strength, pain, Fine motor control, Gross motor control, and UE functional use, cognitive skills including , and psychosocial skills including coping strategies, environmental adaptation, and routines and behaviors.  IMPAIRMENTS: are limiting patient from ADLs, IADLs, and leisure.   CO-MORBIDITIES: may have co-morbidities  that affects occupational performance. Patient will benefit from skilled OT to address above impairments and improve overall function.  MODIFICATION OR ASSISTANCE TO COMPLETE EVALUATION: Min-Moderate modification of tasks or assist with assess necessary to complete an evaluation.  OT OCCUPATIONAL PROFILE AND HISTORY: Detailed assessment: Review of records and additional review of physical, cognitive, psychosocial history related to current functional performance.  CLINICAL DECISION MAKING: Moderate - several treatment options, min-mod task modification necessary  REHAB POTENTIAL: Good  EVALUATION COMPLEXITY: Moderate    PLAN:  OT FREQUENCY: 2x/month  OT DURATION: 12 weeks  PLANNED INTERVENTIONS: 97535 self care/ADL training, 96295 therapeutic exercise, 97530 therapeutic activity, 97112 neuromuscular re-education, 97140 manual therapy, 97018 paraffin, 28413 moist heat, 97034 contrast bath, energy conservation, patient/family education, and DME and/or AE instructions  RECOMMENDED OTHER SERVICES: PT  CONSULTED AND AGREED WITH PLAN OF CARE: Patient  PLAN FOR NEXT SESSION: Treatment  Duey G[place], MS, OTR/L

## 2023-12-16 ENCOUNTER — Encounter: Payer: Self-pay | Admitting: *Deleted

## 2023-12-17 ENCOUNTER — Ambulatory Visit
Admission: RE | Admit: 2023-12-17 | Discharge: 2023-12-17 | Disposition: A | Discharge status: Home / Self Care | Payer: 59 | Attending: Gastroenterology | Admitting: Gastroenterology

## 2023-12-17 ENCOUNTER — Encounter
Admission: RE | Disposition: A | Discharge status: Home / Self Care | Payer: Self-pay | Source: Home / Self Care | Attending: Gastroenterology

## 2023-12-17 ENCOUNTER — Ambulatory Visit

## 2023-12-17 ENCOUNTER — Encounter: Payer: Self-pay | Admitting: *Deleted

## 2023-12-17 DIAGNOSIS — K3189 Other diseases of stomach and duodenum: Secondary | ICD-10-CM | POA: Diagnosis not present

## 2023-12-17 DIAGNOSIS — D125 Benign neoplasm of sigmoid colon: Secondary | ICD-10-CM | POA: Insufficient documentation

## 2023-12-17 DIAGNOSIS — F840 Autistic disorder: Secondary | ICD-10-CM | POA: Insufficient documentation

## 2023-12-17 DIAGNOSIS — K6289 Other specified diseases of anus and rectum: Secondary | ICD-10-CM | POA: Diagnosis not present

## 2023-12-17 DIAGNOSIS — K219 Gastro-esophageal reflux disease without esophagitis: Secondary | ICD-10-CM | POA: Diagnosis not present

## 2023-12-17 DIAGNOSIS — R109 Unspecified abdominal pain: Secondary | ICD-10-CM | POA: Diagnosis not present

## 2023-12-17 DIAGNOSIS — K319 Disease of stomach and duodenum, unspecified: Secondary | ICD-10-CM | POA: Insufficient documentation

## 2023-12-17 DIAGNOSIS — K2970 Gastritis, unspecified, without bleeding: Secondary | ICD-10-CM | POA: Insufficient documentation

## 2023-12-17 DIAGNOSIS — I10 Essential (primary) hypertension: Secondary | ICD-10-CM | POA: Insufficient documentation

## 2023-12-17 DIAGNOSIS — K640 First degree hemorrhoids: Secondary | ICD-10-CM | POA: Insufficient documentation

## 2023-12-17 DIAGNOSIS — K648 Other hemorrhoids: Secondary | ICD-10-CM | POA: Insufficient documentation

## 2023-12-17 DIAGNOSIS — Z79899 Other long term (current) drug therapy: Secondary | ICD-10-CM | POA: Diagnosis not present

## 2023-12-17 DIAGNOSIS — R634 Abnormal weight loss: Secondary | ICD-10-CM | POA: Diagnosis not present

## 2023-12-17 DIAGNOSIS — D510 Vitamin B12 deficiency anemia due to intrinsic factor deficiency: Secondary | ICD-10-CM | POA: Insufficient documentation

## 2023-12-17 DIAGNOSIS — K649 Unspecified hemorrhoids: Secondary | ICD-10-CM | POA: Diagnosis not present

## 2023-12-17 DIAGNOSIS — K635 Polyp of colon: Secondary | ICD-10-CM | POA: Diagnosis not present

## 2023-12-17 HISTORY — PX: POLYPECTOMY: SHX149

## 2023-12-17 HISTORY — PX: COLONOSCOPY WITH PROPOFOL: SHX5780

## 2023-12-17 HISTORY — PX: ESOPHAGOGASTRODUODENOSCOPY (EGD) WITH PROPOFOL: SHX5813

## 2023-12-17 HISTORY — DX: Cerebral infarction, unspecified: I63.9

## 2023-12-17 SURGERY — COLONOSCOPY WITH PROPOFOL
Anesthesia: General

## 2023-12-17 MED ORDER — SODIUM CHLORIDE 0.9 % IV SOLN: INTRAVENOUS | Status: DC

## 2023-12-17 MED ORDER — PROPOFOL 500 MG/50ML IV EMUL
INTRAVENOUS | Status: DC | PRN
Start: 2023-12-17 — End: 2023-12-17
  Administered 2023-12-17: 50 ug/kg/min via INTRAVENOUS

## 2023-12-17 MED ORDER — PROPOFOL 10 MG/ML IV BOLUS
INTRAVENOUS | Status: DC | PRN
  Administered 2023-12-17: 30 mg via INTRAVENOUS
  Administered 2023-12-17: 50 mg via INTRAVENOUS
  Administered 2023-12-17: 20 mg via INTRAVENOUS

## 2023-12-17 MED ORDER — LIDOCAINE HCL (CARDIAC) PF 100 MG/5ML IV SOSY
PREFILLED_SYRINGE | INTRAVENOUS | Status: DC | PRN
  Administered 2023-12-17: 60 mg via INTRAVENOUS

## 2023-12-17 MED ORDER — DEXMEDETOMIDINE HCL IN NACL 80 MCG/20ML IV SOLN
INTRAVENOUS | Status: DC | PRN
Start: 2023-12-17 — End: 2023-12-17
  Administered 2023-12-17 (×2): 8 ug via INTRAVENOUS

## 2023-12-17 MED ORDER — GLYCOPYRROLATE 0.2 MG/ML IJ SOLN
INTRAMUSCULAR | Status: DC | PRN
  Administered 2023-12-17: .2 mg via INTRAVENOUS

## 2023-12-17 NOTE — Anesthesia Postprocedure Evaluation (Signed)
 Anesthesia Post Note  Patient: [name]  Procedure(s) Performed: COLONOSCOPY WITH PROPOFOL  ESOPHAGOGASTRODUODENOSCOPY (EGD) WITH PROPOFOL  POLYPECTOMY, INTESTINE  Patient location during evaluation: Endoscopy Anesthesia Type: General Level of consciousness: awake and alert Pain management: pain level controlled Vital Signs Assessment: post-procedure vital signs reviewed and stable Respiratory status: spontaneous breathing, nonlabored ventilation and respiratory function stable Cardiovascular status: blood pressure returned to baseline and stable Postop Assessment: no apparent nausea or vomiting Anesthetic complications: no   No notable events documented.   Last Vitals:  Vitals:   12/17/23 0758 12/17/23 0846  BP: (!) 155/93   Pulse: 93 85  Resp: 17 15  Temp: (!) 36.2 C (!) 36.1 C  SpO2: 98% 99%    Last Pain:  Vitals:   12/17/23 0906  TempSrc:   PainSc: 0-No pain                 Baltazar Bonier

## 2023-12-17 NOTE — Op Note (Signed)
 [facility] Patient Name: [name] Procedure Date: 12/17/2023 8:18 AM MRN: [id number] Account #: [id number] Date of Birth: [birth year] Admit Type: Outpatient Age: 62 Room: [facility] ENDO ROOM 1 Gender: Male Note Status: Supervisor Override Instrument Name: Charlyn Cooley [id number] Procedure:             Colonoscopy Indications:           Abdominal pain in the left upper quadrant, Pernicious                         anemia, Weight loss Providers:             Leida Puna MD, MD Referring MD:          Aileen Alexanders (Referring MD) Medicines:             Monitored Anesthesia Care Complications:         No immediate complications. Estimated blood loss:                         Minimal. Procedure:             Pre-Anesthesia Assessment:                        - Prior to the procedure, a History and Physical was                         performed, and patient medications and allergies were                         reviewed. The patient is competent. The risks and                         benefits of the procedure and the sedation options and                         risks were discussed with the patient. All questions                         were answered and informed consent was obtained.                         Patient identification and proposed procedure were                         verified by the physician, the nurse, the                         anesthesiologist, the anesthetist and the technician                         in the endoscopy suite. Mental Status Examination:                         alert and oriented. Airway Examination: normal                         oropharyngeal airway and neck mobility. Respiratory  Examination: clear to auscultation. CV Examination:                         normal. Prophylactic Antibiotics: The patient does not                         require prophylactic antibiotics. Prior                          Anticoagulants: The patient has taken no anticoagulant                         or antiplatelet agents. ASA Grade Assessment: III - A                         patient with severe systemic disease. After reviewing                         the risks and benefits, the patient was deemed in                         satisfactory condition to undergo the procedure. The                         anesthesia plan was to use monitored anesthesia care                         (MAC). Immediately prior to administration of                         medications, the patient was re-assessed for adequacy                         to receive sedatives. The heart rate, respiratory                         rate, oxygen saturations, blood pressure, adequacy of                         pulmonary ventilation, and response to care were                         monitored throughout the procedure. The physical                         status of the patient was re-assessed after the                         procedure.                        After obtaining informed consent, the colonoscope was                         passed under direct vision. Throughout the procedure,                         the patient's blood pressure, pulse, and oxygen  saturations were monitored continuously. The                         Colonoscope was introduced through the anus and                         advanced to the the terminal ileum, with                         identification of the appendiceal orifice and IC                         valve. The colonoscopy was performed without                         difficulty. The patient tolerated the procedure well.                         The quality of the bowel preparation was adequate to                         identify polyps. The terminal ileum, ileocecal valve,                         appendiceal orifice, and rectum were photographed. Findings:      The perianal and digital  rectal examinations were normal.      The terminal ileum appeared normal.      A 3 mm polyp was found in the sigmoid colon. The polyp was sessile. The       polyp was removed with a cold snare. Resection and retrieval were       complete. Estimated blood loss was minimal.      Internal hemorrhoids were found during retroflexion. The hemorrhoids       were Grade I (internal hemorrhoids that do not prolapse).      Anal papilla(e) were hypertrophied.      The exam was otherwise without abnormality on direct and retroflexion       views. Impression:            - The examined portion of the ileum was normal.                        - One 3 mm polyp in the sigmoid colon, removed with a                         cold snare. Resected and retrieved.                        - Internal hemorrhoids.                        - Anal papilla(e) were hypertrophied.                        - The examination was otherwise normal on direct and                         retroflexion views. Recommendation:        - Discharge patient to home.                        -  Resume previous diet.                        - Continue present medications.                        - Await pathology results.                        - Repeat colonoscopy in 7-10 years for surveillance.                        - Return to referring physician as previously                         scheduled. Procedure Code(s):     --- Professional ---                        [phone], Colonoscopy, flexible; with removal of                         tumor(s), polyp(s), or other lesion(s) by snare                         technique Diagnosis Code(s):     --- Professional ---                        K64.0, First degree hemorrhoids                        D12.5, Benign neoplasm of sigmoid colon                        K62.89, Other specified diseases of anus and rectum                        R10.12, Left upper quadrant pain CPT copyright 2022 American Medical  Association. All rights reserved. The codes documented in this report are preliminary and upon coder review may  be revised to meet current compliance requirements. Leida Puna MD, MD 12/17/2023 8:53:05 AM Number of Addenda: 0 Note Initiated On: 12/17/2023 8:18 AM Scope Withdrawal Time: 0 hours 8 minutes 43 seconds  Total Procedure Duration: 0 hours 12 minutes 7 seconds  Estimated Blood Loss:  Estimated blood loss was minimal.      [facility]

## 2023-12-17 NOTE — H&P (Signed)
 Outpatient short stay form Pre-procedure 12/17/2023  Shane Darling, MD  Primary Physician: Aileen Alexanders, NP  Reason for visit:  Pernicious anemia/Abdominal pain  History of present illness:    64 y/o gentleman with history of autism, b12 deficiency, and hypertension here for EGD/Colonoscopy for dyspepsia, weight loss, luq pain. No blood thinners. No family history of colon or esophageal cancer. No significant abdominal surgeries.    Current Facility-Administered Medications:    0.9 %  sodium chloride  infusion, , Intravenous, Continuous, Tonica Brasington, Leanora Prophet, MD, Last Rate: 20 mL/hr at 12/17/23 0813, New Bag at 12/17/23 0813  Medications Prior to Admission  Medication Sig Dispense Refill Last Dose/Taking   amLODipine  (NORVASC ) 5 MG tablet Take 5 mg by mouth daily.   12/17/2023 at  6:00 AM   aspirin EC 81 MG tablet Take 81 mg by mouth daily. Swallow whole.   12/16/2023   Cholecalciferol  (VITAMIN D3 PO) Take 5,000 Int'l Units by mouth daily.   Past Week   Cyanocobalamin  (VITAMIN B 12 PO) Take 5,000 mcg by mouth daily.   Past Week   hydrochlorothiazide  (MICROZIDE ) 12.5 MG capsule Take 1 capsule (12.5 mg total) by mouth daily. 90 capsule 3 12/17/2023 at  6:00 AM   acetaminophen  (TYLENOL ) 500 MG tablet Take 500 mg by mouth every 6 (six) hours as needed.      aspirin EC 325 MG tablet Take 325 mg by mouth as needed (Pain).      Docusate Sodium  (COLACE PO) Take by mouth daily. Daily      LORazepam  (ATIVAN ) 0.5 MG tablet Take 1 tablet (0.5 mg total) by mouth 2 (two) times daily as needed for anxiety. 30 tablet 0    rizatriptan  (MAXALT ) 10 MG tablet Take 1 tablet (10 mg total) by mouth as needed for migraine. May repeat in 2 hours if needed 10 tablet 0      No Known Allergies   Past Medical History:  Diagnosis Date   ADHD    Arthritis    Concussion    COVID 12/2020   Hypertension    Multiple lacunar infarcts (HCC)    MVP (mitral valve prolapse)    Pernicious anemia    [place] fever    Spinal stenosis    Stroke [facility])    Vestibular migraine    Vitamin B12 deficiency    Vitamin D  deficiency     Review of systems:  Otherwise negative.    Physical Exam  Gen: Alert, oriented. Appears stated age.  HEENT: PERRLA. Lungs: No respiratory distress CV: RRR Abd: soft, benign, no masses Ext: No edema    Planned procedures: Proceed with EGD/colonoscopy. The patient understands the nature of the planned procedure, indications, risks, alternatives and potential complications including but not limited to bleeding, infection, perforation, damage to internal organs and possible oversedation/side effects from anesthesia. The patient agrees and gives consent to proceed.  Please refer to procedure notes for findings, recommendations and patient disposition/instructions.     Shane Darling, MD [facility]

## 2023-12-17 NOTE — Anesthesia Preprocedure Evaluation (Addendum)
 Anesthesia Evaluation  Patient identified by MRN, date of birth, ID band Patient awake    Reviewed: Allergy & Precautions, H&P , NPO status , Patient's Chart, lab work & pertinent test results, reviewed documented beta blocker date and time   Airway Mallampati: III  TM Distance: >3 FB Neck ROM: full    Dental  (+) Teeth Intact   Pulmonary neg pulmonary ROS   Pulmonary exam normal        Cardiovascular Exercise Tolerance: Poor hypertension, On Medications + Valvular Problems/Murmurs MVP  Rhythm:regular Rate:Tachycardia     Neuro/Psych  Headaches PSYCHIATRIC DISORDERS       Neuromuscular disease CVA    GI/Hepatic negative GI ROS, Neg liver ROS,,,  Endo/Other  negative endocrine ROS    Renal/GU negative Renal ROS  negative genitourinary   Musculoskeletal   Abdominal Normal abdominal exam  (+)   Peds  Hematology   Anesthesia Other Findings Past Medical History: No date: ADHD No date: Arthritis No date: Concussion 12/2020: COVID No date: Hypertension No date: Multiple lacunar infarcts (HCC) No date: MVP (mitral valve prolapse) No date: Pernicious anemia No date: [facility] spotted fever No date: Spinal stenosis No date: Vestibular migraine No date: Vitamin B12 deficiency No date: Vitamin D  deficiency Past Surgical History: No date: BILATERAL CARPAL TUNNEL RELEASE 1984: FRACTURE SURGERY; Left     Comment:  intramedullary rod 1984: hip bone transplant No date: LEG SURGERY; Left     Comment:  oseotomy 1978: REPAIR ANKLE LIGAMENT; Right No date: REPAIR KNEE LIGAMENT; Bilateral     Comment:  acl No date: TRIGGER FINGER RELEASE; Bilateral     Comment:  thumbs BMI    Body Mass Index: 24.20 kg/m     Reproductive/Obstetrics negative OB ROS                             Anesthesia Physical Anesthesia Plan  ASA: 3  Anesthesia Plan: General   Post-op Pain Management: Minimal or  no pain anticipated   Induction: Intravenous  PONV Risk Score and Plan: TIVA and Propofol  infusion  Airway Management Planned: Natural Airway  Additional Equipment:   Intra-op Plan:   Post-operative Plan:   Informed Consent: I have reviewed the patients History and Physical, chart, labs and discussed the procedure including the risks, benefits and alternatives for the proposed anesthesia with the patient or authorized representative who has indicated his/her understanding and acceptance.     Dental Advisory Given  Plan Discussed with: CRNA  Anesthesia Plan Comments:        Anesthesia Quick Evaluation

## 2023-12-17 NOTE — Transfer of Care (Signed)
 Immediate Anesthesia Transfer of Care Note  Patient: [name]  Procedure(s) Performed: COLONOSCOPY WITH PROPOFOL  ESOPHAGOGASTRODUODENOSCOPY (EGD) WITH PROPOFOL  POLYPECTOMY, INTESTINE  Patient Location: PACU  Anesthesia Type:General  Level of Consciousness: sedated  Airway & Oxygen Therapy: Patient Spontanous Breathing and Patient connected to nasal cannula oxygen  Post-op Assessment: Report given to RN and Post -op Vital signs reviewed and stable  Post vital signs: Reviewed and stable  Last Vitals:  Vitals Value Taken Time  BP 110/86 12/17/23 0847  Temp 36.1 C 12/17/23 0846  Pulse 80 12/17/23 0847  Resp 18 12/17/23 0847  SpO2 100 % 12/17/23 0847  Vitals shown include unfiled device data.  Last Pain:  Vitals:   12/17/23 0846  TempSrc: Temporal  PainSc: Asleep         Complications: No notable events documented.

## 2023-12-17 NOTE — Interval H&P Note (Signed)
 History and Physical Interval Note:  12/17/2023 8:20 AM  [name]  has presented today for surgery, with the diagnosis of weight loss,pernicious anemia,GERD,LLQ pain.  The various methods of treatment have been discussed with the patient and family. After consideration of risks, benefits and other options for treatment, the patient has consented to  Procedure(s): COLONOSCOPY WITH PROPOFOL  (N/A) ESOPHAGOGASTRODUODENOSCOPY (EGD) WITH PROPOFOL  (N/A) as a surgical intervention.  The patient's history has been reviewed, patient examined, no change in status, stable for surgery.  I have reviewed the patient's chart and labs.  Questions were answered to the patient's satisfaction.     Shane Darling  Ok to proceed with EGD/Colonoscopy

## 2023-12-17 NOTE — Op Note (Signed)
 [facility] Gastroenterology Patient Name: [name] Procedure Date: 12/17/2023 8:18 AM MRN: [id number] Account #: [id number] Date of Birth: [birth year] Admit Type: Outpatient Age: 63 Room: [facility] ENDO ROOM 1 Gender: Male Note Status: Supervisor Override Instrument Name: Cristino Donna Endoscope [id number] Procedure:             Upper GI endoscopy Indications:           Abdominal pain in the left upper quadrant, Pernicious                         anemia, Dyspepsia, Gastro-esophageal reflux disease,                         Weight loss Providers:             Leida Puna MD, MD Referring MD:          Aileen Alexanders (Referring MD) Medicines:             Monitored Anesthesia Care Complications:         No immediate complications. Estimated blood loss:                         Minimal. Procedure:             Pre-Anesthesia Assessment:                        - Prior to the procedure, a History and Physical was                         performed, and patient medications and allergies were                         reviewed. The patient is competent. The risks and                         benefits of the procedure and the sedation options and                         risks were discussed with the patient. All questions                         were answered and informed consent was obtained.                         Patient identification and proposed procedure were                         verified by the physician, the nurse, the                         anesthesiologist, the anesthetist and the technician                         in the endoscopy suite. Mental Status Examination:                         alert and oriented. Airway Examination: normal  oropharyngeal airway and neck mobility. Respiratory                         Examination: clear to auscultation. CV Examination:                         normal. Prophylactic Antibiotics: The patient does not                          require prophylactic antibiotics. Prior                         Anticoagulants: The patient has taken no anticoagulant                         or antiplatelet agents. ASA Grade Assessment: III - A                         patient with severe systemic disease. After reviewing                         the risks and benefits, the patient was deemed in                         satisfactory condition to undergo the procedure. The                         anesthesia plan was to use monitored anesthesia care                         (MAC). Immediately prior to administration of                         medications, the patient was re-assessed for adequacy                         to receive sedatives. The heart rate, respiratory                         rate, oxygen saturations, blood pressure, adequacy of                         pulmonary ventilation, and response to care were                         monitored throughout the procedure. The physical                         status of the patient was re-assessed after the                         procedure.                        After obtaining informed consent, the endoscope was                         passed under direct vision. Throughout the procedure,  the patient's blood pressure, pulse, and oxygen                         saturations were monitored continuously. The Endoscope                         was introduced through the mouth, and advanced to the                         second part of duodenum. The upper GI endoscopy was                         accomplished without difficulty. The patient tolerated                         the procedure well. Findings:      The examined esophagus was normal.      Patchy minimal inflammation characterized by erythema was found in the       gastric antrum. Biopsies were taken with a cold forceps for histology.       Estimated blood loss was minimal.      The examined  duodenum was normal. Impression:            - Normal esophagus.                        - Gastritis. Biopsied.                        - Normal examined duodenum. Recommendation:        - Discharge patient to home.                        - Resume previous diet.                        - Continue present medications.                        - Await pathology results.                        - Return to referring physician as previously                         scheduled. Procedure Code(s):     --- Professional ---                        [phone], Esophagogastroduodenoscopy, flexible,                         transoral; with biopsy, single or multiple Diagnosis Code(s):     --- Professional ---                        K29.70, Gastritis, unspecified, without bleeding                        R10.12, Left upper quadrant pain                        R10.13, Epigastric pain CPT copyright  2022 American Medical Association. All rights reserved. The codes documented in this report are preliminary and upon coder review may  be revised to meet current compliance requirements. Leida Puna MD, MD 12/17/2023 8:49:07 AM Number of Addenda: 0 Note Initiated On: 12/17/2023 8:18 AM Estimated Blood Loss:  Estimated blood loss was minimal.      [facility]

## 2023-12-18 ENCOUNTER — Ambulatory Visit: Payer: 59 | Admitting: Psychology

## 2023-12-18 LAB — SURGICAL PATHOLOGY

## 2023-12-18 NOTE — Therapy (Signed)
 OUTPATIENT PHYSICAL THERAPY NEURO TREATMENT     Patient Name: [name] MRN: [id number] DOB:12/03/1960, 63 y.o., male Today's Date: 12/19/2023   PCP: Aileen Alexanders NP REFERRING PROVIDER: Noble Bateman PA  END OF SESSION:  PT End of Session - 12/19/23 1350     Visit Number 36    Number of Visits 43    Date for PT Re-Evaluation 01/16/24    Authorization Type eval 2/21    PT Start Time 1405    PT Stop Time 1444    PT Time Calculation (min) 39 min    Equipment Utilized During Treatment Gait belt    Activity Tolerance Patient tolerated treatment well    Behavior During Therapy [facility] for tasks assessed/performed                                             Past Medical History:  Diagnosis Date   ADHD    Arthritis    Concussion    COVID 12/2020   Hypertension    Multiple lacunar infarcts (HCC)    MVP (mitral valve prolapse)    Pernicious anemia    Rocky Mountain spotted fever    Spinal stenosis    Stroke [facility])    Vestibular migraine    Vitamin B12 deficiency    Vitamin D  deficiency    Past Surgical History:  Procedure Laterality Date   ANTERIOR CERVICAL DECOMP/DISCECTOMY FUSION N/A 07/30/2022   Procedure: C4-7 ANTERIOR CERVICAL DISCECTOMY AND FUSION (GLOBUS HEDRON);  Surgeon: Jodeen Munch, MD;  Location: [facility] ORS;  Service: Neurosurgery;  Laterality: N/A;   BILATERAL CARPAL TUNNEL RELEASE     CERVICAL WOUND DEBRIDEMENT N/A 08/01/2022   Procedure: CERVICAL WOUND DEBRIDEMENT OF HEMATOMA;  Surgeon: Jodeen Munch, MD;  Location: [facility] ORS;  Service: Neurosurgery;  Laterality: N/A;   COLONOSCOPY     COLONOSCOPY WITH PROPOFOL  N/A 12/17/2023   Procedure: COLONOSCOPY WITH PROPOFOL ;  Surgeon: Shane Darling, MD;  Location: [facility] ENDOSCOPY;  Service: Endoscopy;  Laterality: N/A;   ESOPHAGOGASTRODUODENOSCOPY (EGD) WITH PROPOFOL  N/A 12/17/2023   Procedure: ESOPHAGOGASTRODUODENOSCOPY (EGD) WITH PROPOFOL ;  Surgeon:  Shane Darling, MD;  Location: [facility] ENDOSCOPY;  Service: Endoscopy;  Laterality: N/A;   FRACTURE SURGERY Left 1984   intramedullary rod   hip bone transplant  1984   LEG SURGERY Left    oseotomy   POLYPECTOMY  12/17/2023   Procedure: POLYPECTOMY, INTESTINE;  Surgeon: Shane Darling, MD;  Location: [facility] ENDOSCOPY;  Service: Endoscopy;;   REPAIR ANKLE LIGAMENT Right 1978   REPAIR KNEE LIGAMENT Bilateral    acl   TRIGGER FINGER RELEASE Bilateral    thumbs   Patient Active Problem List   Diagnosis Date Noted   Lacunar stroke (HCC) 12/12/2023   Essential tremor 12/12/2023   Dizziness 10/30/2023   Postoperative hematoma of musculoskeletal structure following musculoskeletal procedure 08/01/2022   Cervical myelopathy (HCC) 07/30/2022   Cervical spinal stenosis 07/30/2022   Radiculopathy, cervical 07/30/2022   S/P cervical spinal fusion 07/30/2022   Vitamin D  deficiency 06/25/2022   B12 deficiency 06/25/2022   Elevated LDL cholesterol level 06/25/2022   Prediabetes 03/27/2021   History of nausea- reported 01/27/2020 02/02/2020   Accidental fall from ladder 02/02/2020   Acute right-sided thoracic back pain 02/02/2020   Neck stiffness 02/02/2020   Concussion with no loss of consciousness 02/02/2020   Multiple falls 02/02/2020  Head trauma, initial encounter 02/02/2020   Acute post-traumatic headache, not intractable 02/02/2020   Essential hypertension 08/26/2018   Pre-bariatric surgery nutrition evaluation 06/19/2018   Migraine with vertigo 01/05/2014   Arthralgia of multiple joints 04/28/2009   Carpal tunnel syndrome 08/30/2008    ONSET DATE: summer 2022  REFERRING DIAG: LE weakness and gait instability   THERAPY DIAG:  Muscle weakness (generalized)  Unsteadiness on feet  Difficulty in walking, not elsewhere classified  Rationale for Evaluation and Treatment: Rehabilitation  SUBJECTIVE:                                                                                                                                                                                              SUBJECTIVE STATEMENT: Patient has been working on bikes.   Pt accompanied by: self  PERTINENT HISTORY:  Patient presents for LE weakness and gait instability. He is s/p ACDF C-7 on 07/30/22. Patient has a leg length discrepancy and limited L knee flexion since his 20's. Patient has a lot of atrophy and lower back pain, foot drop bilaterally.  First symptoms was stumbling up stairs, with LLE. Foot drop was improved after ACDF. Now has numbness and tingling at the end of the day. Does have a few lesions in his pons per wife, on blood pressure medication now. PMH includes ADHD, arthritis, concussion COVID, HTN, multiple lacunar infarcts, MVP, pernicious anemia, rocky mountain spotted fever, spinal stenosis, vestibular migraine, vitamin B deficiency.   PAIN:  Are you having pain?  Denies pain   PRECAUTIONS: Other: cervical; no lifting more than 20 lb   WEIGHT BEARING RESTRICTIONS: No  FALLS: Has patient fallen in last 6 months? Yes. Number of falls multiple a week  LIVING ENVIRONMENT: Lives with: lives with their family Lives in: House/apartment Stairs: Yes: Internal: flight steps; is sleeping downstairs Has following equipment at home: Single point cane  PLOF: Independent, uses a walking stick  PATIENT GOALS: gait control of feet, strengthen legs, balance, atrophy in hand  OBJECTIVE:   DIAGNOSTIC FINDINGS:  09/11/22: No acute fracture. No spondylolisthesis. No compression deformities. Disc space narrowing C3-4 with marginal osteophytes consistent with degenerative disc disease. Postop changes ACDF C4-C7 with anatomic alignment. Normal prevertebral and cervicocranial soft tissues.   06/19/22: 1. L5 chronic pars defects with L5-S1 anterolisthesis and advanced disc degeneration causing marked biforaminal L5 impingement. 2. Degeneration and short pedicles causes compressive  spinal stenosis from L1-2 to L3-4, most advanced at L2-3. Moderate bilateral foraminal narrowing at the same levels.   COGNITION: Overall cognitive status: Within functional limits for tasks assessed    COORDINATION: Limited LLE  MUSCLE  LENGTH: Significant limitation L hamstring; 60 degree flexion deficit.   POSTURE:  weight shift to the R  LOWER EXTREMITY ROM:     L knee flexion contraction -60 contraction LOWER EXTREMITY MMT:    MMT Right Eval R 5/15 R 8/22 Left Eval L 5/15 L 8/22  Hip flexion 4 5 5 4  4+ 4  Hip extension        Hip abduction 4- 4 4 4- 4 4  Hip adduction 4- 4 4 4- 4- 4  Hip internal rotation        Hip external rotation        Knee flexion 4- 4 4 3  4- 4-  Knee extension 4 4+ 5 4 4  4-  Ankle dorsiflexion 3 3 3+ 2+ 2+ 2+  Ankle plantarflexion 3 3+ 4- 3 3+ 3+  Ankle inversion        Ankle eversion        (Blank rows = not tested)  01/02/23 Right 8/22: RECERT  10/17 PN Left 8/22: Recert LUE 10/17: PN   Shoulder Flexion 4 4+ 5 4- 4+ 4+  Shoulder Abduction 4- 4 4+ 4- 4 4+  Shoulder Extension 4- 4+ 4 4- 4   Shoulder adduction 4- 4  3+ 4 4+  Bicep  4 4+ 5 4 4+ 4  Triceps 4- 4- 5 3+ 4 4     Right Left  Hip flexion 12.2 10.2  Hip Abduction 7.4 7.0  Hip Adduction 10.8 10.5  Knee Extension  13.8 13.4  Knee Flexion 13.4 13.2  DF    PF       BED MOBILITY:  Perform next session  TRANSFERS: Assistive device utilized: Single point cane  Sit to stand: CGA Stand to sit: CGA Chair to chair: CGA   GAIT: Gait pattern: L knee limited extension, L foot drop, L foot eversion Distance walked: 1200 Assistive device utilized: Single point cane Level of assistance: CGA Comments: Patient has increased eversion and rolling of L foot, LLE leg length discrepancy.   FUNCTIONAL TESTS:  5 times sit to stand: 5.6 seconds with minor use of LLE no UE support Timed up and go (TUG): n/a 6 minute walk test: 1200 ft 10 meter walk test: 7 seconds BERG: 36/56    PATIENT SURVEYS:  FOTO 54  TODAY'S TREATMENT:                                                                                                                              DATE: 12/19/23 Unless otherwise stated, CGA was provided and gait belt donned in order to ensure pt safety    TherAct: In // bars: Walk forward/backwards without UE support 8x length Lateral step 8x length of bars; cue for visual feedback   5lb ankle weights; -walking marches 4x length  -lateral stepping 4x length   TherEx:  Adductor squeeze 15x  Adduction squeeze with ER/IR 10x each LE  Adduction with heel raise  15  #5 AW Seated LAQ 10x each LE  Heel raise 15x  Neuro Re-ed:  balance beam: -lateral step with UE support 6x with cue for visual field -tandem walk with UE support 8x -static stand 60 seconds  -pvc pipe chest press 10x, overhead press 10x   Single leg stance LLE no shoe on 30 seconds x 3 trials  PATIENT EDUCATION: Education details: goals, POC, educated on safety during updated exercises  Person educated: Patient and Spouse Education method: Explanation, Demonstration, Tactile cues, and Verbal cues Education comprehension: verbalized understanding, returned demonstration, verbal cues required, and tactile cues required  HOME EXERCISE PROGRAM: Access Code: [email or web address] URL: [email or web address] Date: 10/11/2022 Prepared by: Tametra Ahart  Exercises - Seated Ankle Alphabet  - 1 x daily - 7 x weekly - 2 sets - 10 reps - 5 hold - Seated Heel Toe Raises  - 1 x daily - 7 x weekly - 2 sets - 10 reps - 5 hold - Standing March with Counter Support  - 1 x daily - 7 x weekly - 2 sets - 10 reps - 5 hold  Access Code: [email or web address] URL: [email or web address] Date: 10/30/2022 Prepared by: Kassidie Hendriks  Exercises - Seated Ankle Plantarflexion with Resistance  - 1 x daily - 7 x weekly - 2 sets - 10 reps - 5 hold - Seated Ankle Eversion with Resistance  - 1 x daily - 7 x  weekly - 2 sets - 10 reps - 5 hold  Access Code: [email or web address] URL: [email or web address] Date: 11/14/2022 Prepared by: Franklin Baumbach  Exercises - Seated Heel Toe Raises  - 1 x daily - 7 x weekly - 2 sets - 10 reps - 5 hold - Seated Hip Internal Rotation AROM  - 1 x daily - 7 x weekly - 2 sets - 10 reps - 5 hold - Bilateral Long Arc Quad  - 1 x daily - 7 x weekly - 2 sets - 10 reps - 5 hold - Foam Balance Tandem stance  - 1 x daily - 7 x weekly - 2 sets - 2 reps - 30 hold - Single Leg Balance on Foam  - 1 x daily - 7 x weekly - 2 sets - 30 reps - 2 hold - Seated Hip Flexion March with Ankle Weights  - 1 x daily - 7 x weekly - 2 sets - 10 reps - 5 hold - Standing Hip Abduction with Ankle Weight  - 1 x daily - 7 x weekly - 2 sets - 10 reps - 5 hold - Standing Hip Extension with Ankle Weight  - 1 x daily - 7 x weekly - 2 sets - 10 reps - 5 hold - Standing Hip Flexion with Ankle Weight  - 1 x daily - 7 x weekly - 2 sets - 10 reps - 5 hold - Seated Long Arc Quad with Ankle Weight  - 1 x daily - 7 x weekly - 2 sets - 10 reps - 5 hold Access Code: [email or web address] URL: [email or web address] Date: 08/22/2023 Prepared by: Jaymason Ledesma  Exercises - Seated Finger Tip Pinch with Putty  - 1 x daily - 7 x weekly - 2 sets - 10 reps - 5 hold - Hand Squeezes  - 1 x daily - 7 x weekly - 2 sets - 10 reps - 5 hold - Key Pinch with Putty  - 1 x daily - 7 x weekly - 2 sets - 10 reps - 5 hold  GOALS: Goals reviewed with patient? Yes  SHORT TERM GOALS: Target date: 05/09/2023   Patient will be independent in home exercise program to improve strength/mobility for better functional independence with ADLs. Baseline: 2/21: HEP give next session 5/15 : intermittent compliance 8/22: intermittent compliance due to illness 11/14: compliant  Goal status: MET    LONG TERM GOALS: Target date: 01/16/2024    Patient will increase FOTO score to equal to or greater than  64%   to demonstrate  statistically significant improvement in mobility and quality of life.  Baseline: 2/21: 54% 8/22: 52% 10/17: 57% 11/14: 53% 1/16: 47% Goal status: terminated  2.  Patient will increase Berg Balance score by > 6 points (42/56)to demonstrate decreased fall risk during functional activities. Baseline: 2/21: 36/56 5/15: 45   Goal status: MET  3.  Patient will increase six minute walk test distance to >1500 for progression to age norm community ambulator and improve gait ability Baseline: 2/21: 1200 ft with [facility] 5/15: 1300 ft with [facility]  8/22: 905 ft with [facility] 10/17: 1100 ft with [facility] 11/14: 1110 ft with [facility] 1/16: 1210 ft with [facility] 3/6: 1100 ft with two canes Goal status: In progress  4.  Patient will increase BLE gross strength to 4+/5  or within 1 lb of each LE as to improve functional strength for independent gait, increased standing tolerance and increased ADL ability. Baseline: 2/21 :see above 5/15: see above 10/17: RLE 5/5 LLE 4/5 L ankle 3/5  11/14: RLE 5/5 LLE 4+/5 hamstring 4/5, ankle: 3+/5 1/16: RLE 5/5 LLE 4+/5 ankle 4-/5 3/6: see above  Goal status:  In progress / modified  6.  Patient will increase Berg Balance score by > 6 points (51/56) to demonstrate decreased fall risk during functional activities. Baseline:  5/15: 45 8/22: 41/56 10/17: 28 11/14: 36 1/16: 36 3/6: 25 Goal status: Ongoing  7.   Patient will increase UE strength to >4+/5 for ADLs, AD usage, and quality of life.  Baseline: 5/15: see above 8/22: see above 11/14: >4+/5 bilaterally  Goal status: MET  8.   Patient will increase lower extremity functional scale to >60/80 to demonstrate improved functional mobility and increased tolerance with ADLs.  Baseline: 11/14: 29%  1/16: 29%  3/6: 19%  Goal status: ongoing   ASSESSMENT:  CLINICAL IMPRESSION:  Patient educated to follow up with physician for referral to orthotist for custom orthotics. Patients shoes are causing increased rolling of L ankle. Patient is very fatigued  by end of session.  Patient will benefit from skilled physical therapy to improve strength, mobility, and stability for improved quality of life.   OBJECTIVE IMPAIRMENTS: Abnormal gait, decreased activity tolerance, decreased balance, decreased coordination, decreased endurance, decreased mobility, difficulty walking, decreased ROM, decreased strength, hypomobility, impaired flexibility, improper body mechanics, and postural dysfunction.   ACTIVITY LIMITATIONS: carrying, lifting, bending, standing, squatting, stairs, transfers, reach over head, locomotion level, and caring for others  PARTICIPATION LIMITATIONS: meal prep, cleaning, laundry, personal finances, driving, shopping, community activity, and yard work  PERSONAL FACTORS: Age, Past/current experiences, Time since onset of injury/illness/exacerbation, and 3+ comorbidities: DHD, arthritis, concussion COVID, HTN, multiple lacunar infarcts, MVP, pernicious anemia, rocky mountain spotted fever, spinal stenosis, vestibular migraine, vitamin B deficiency.   are also affecting patient's functional outcome.   REHAB POTENTIAL: Good  CLINICAL DECISION MAKING: Evolving/moderate complexity  EVALUATION COMPLEXITY: Moderate  PLAN:   PT FREQUENCY: 1x/week  PT DURATION: 12 weeks  PLANNED INTERVENTIONS: Therapeutic exercises, Therapeutic activity, Neuromuscular re-education, Balance training, Gait training, Patient/Family education, Self Care, Joint  mobilization, Stair training, Vestibular training, Canalith repositioning, Visual/preceptual remediation/compensation, Orthotic/Fit training, DME instructions, Cognitive remediation, Electrical stimulation, Spinal mobilization, Cryotherapy, Moist heat, Splintting, Taping, Traction, Ultrasound, Manual therapy, and Re-evaluation  PLAN FOR NEXT SESSION:  balance, strength, unstable surfaces    Ezio Wieck  Brain Cahill, PT, DPT Physical Therapist - [facility]  Outpatient Physical  Therapy- Main Campus [phone]

## 2023-12-19 ENCOUNTER — Ambulatory Visit: Admitting: Occupational Therapy

## 2023-12-19 ENCOUNTER — Ambulatory Visit: Payer: 59 | Attending: Neurosurgery

## 2023-12-19 DIAGNOSIS — F0670 Mild neurocognitive disorder due to known physiological condition without behavioral disturbance: Secondary | ICD-10-CM | POA: Diagnosis not present

## 2023-12-19 DIAGNOSIS — R278 Other lack of coordination: Secondary | ICD-10-CM | POA: Diagnosis not present

## 2023-12-19 DIAGNOSIS — M6281 Muscle weakness (generalized): Secondary | ICD-10-CM | POA: Diagnosis not present

## 2023-12-19 DIAGNOSIS — R2681 Unsteadiness on feet: Secondary | ICD-10-CM | POA: Diagnosis not present

## 2023-12-19 DIAGNOSIS — R262 Difficulty in walking, not elsewhere classified: Secondary | ICD-10-CM | POA: Diagnosis not present

## 2023-12-19 DIAGNOSIS — R4681 Obsessive-compulsive behavior: Secondary | ICD-10-CM | POA: Diagnosis not present

## 2023-12-19 DIAGNOSIS — F840 Autistic disorder: Secondary | ICD-10-CM | POA: Diagnosis not present

## 2023-12-19 NOTE — Therapy (Addendum)
 OUTPATIENT OCCUPATIONAL THERAPY NEURO TREATMENT NOTE  Patient Name: [name] MRN: [id number] DOB:[birth year], 63 y.o., male Today's Date: 12/19/2023  PCP: Aileen Alexanders, NP REFERRING PROVIDER: Devora Folks, Linnell Richardson, MD  END OF SESSION:  OT End of Session - 12/19/23 1629     Visit Number 6    Number of Visits 24    Date for OT Re-Evaluation 01/30/24    OT Start Time 1315    OT Stop Time 1400    OT Time Calculation (min) 45 min    Activity Tolerance Patient tolerated treatment well    Behavior During Therapy [facility] for tasks assessed/performed              Past Medical History:  Diagnosis Date   ADHD    Arthritis    Concussion    COVID 12/2020   Hypertension    Multiple lacunar infarcts (HCC)    MVP (mitral valve prolapse)    Pernicious anemia    [place] spotted fever    Spinal stenosis    Stroke [facility])    Vestibular migraine    Vitamin B12 deficiency    Vitamin D  deficiency    Past Surgical History:  Procedure Laterality Date   ANTERIOR CERVICAL DECOMP/DISCECTOMY FUSION N/A 07/30/2022   Procedure: C4-7 ANTERIOR CERVICAL DISCECTOMY AND FUSION (GLOBUS HEDRON);  Surgeon: Jodeen Munch, MD;  Location: [facility] ORS;  Service: Neurosurgery;  Laterality: N/A;   BILATERAL CARPAL TUNNEL RELEASE     CERVICAL WOUND DEBRIDEMENT N/A 08/01/2022   Procedure: CERVICAL WOUND DEBRIDEMENT OF HEMATOMA;  Surgeon: Jodeen Munch, MD;  Location: [facility] ORS;  Service: Neurosurgery;  Laterality: N/A;   COLONOSCOPY     COLONOSCOPY WITH PROPOFOL  N/A 12/17/2023   Procedure: COLONOSCOPY WITH PROPOFOL ;  Surgeon: Shane Darling, MD;  Location: [facility] ENDOSCOPY;  Service: Endoscopy;  Laterality: N/A;   ESOPHAGOGASTRODUODENOSCOPY (EGD) WITH PROPOFOL  N/A 12/17/2023   Procedure: ESOPHAGOGASTRODUODENOSCOPY (EGD) WITH PROPOFOL ;  Surgeon: Shane Darling, MD;  Location: [facility] ENDOSCOPY;  Service: Endoscopy;  Laterality: N/A;   FRACTURE SURGERY Left 1984   intramedullary rod   hip  bone transplant  1984   LEG SURGERY Left    oseotomy   POLYPECTOMY  12/17/2023   Procedure: POLYPECTOMY, INTESTINE;  Surgeon: Shane Darling, MD;  Location: [facility] ENDOSCOPY;  Service: Endoscopy;;   REPAIR ANKLE LIGAMENT Right 1978   REPAIR KNEE LIGAMENT Bilateral    acl   TRIGGER FINGER RELEASE Bilateral    thumbs   Patient Active Problem List   Diagnosis Date Noted   Lacunar stroke (HCC) 12/12/2023   Essential tremor 12/12/2023   Dizziness 10/30/2023   Postoperative hematoma of musculoskeletal structure following musculoskeletal procedure 08/01/2022   Cervical myelopathy (HCC) 07/30/2022   Cervical spinal stenosis 07/30/2022   Radiculopathy, cervical 07/30/2022   S/P cervical spinal fusion 07/30/2022   Vitamin D  deficiency 06/25/2022   B12 deficiency 06/25/2022   Elevated LDL cholesterol level 06/25/2022   Prediabetes 03/27/2021   History of nausea- reported 01/27/2020 02/02/2020   Accidental fall from ladder 02/02/2020   Acute right-sided thoracic back pain 02/02/2020   Neck stiffness 02/02/2020   Concussion with no loss of consciousness 02/02/2020   Multiple falls 02/02/2020   Head trauma, initial encounter 02/02/2020   Acute post-traumatic headache, not intractable 02/02/2020   Essential hypertension 08/26/2018   Pre-bariatric surgery nutrition evaluation 06/19/2018   Migraine with vertigo 01/05/2014   Arthralgia of multiple joints 04/28/2009   Carpal tunnel syndrome 08/30/2008    ONSET DATE: 07/30/2022  REFERRING DIAG: ACDF, Hx of CVAs  THERAPY DIAG:  Muscle weakness (generalized)  Rationale for Evaluation and Treatment: Rehabilitation  SUBJECTIVE:   SUBJECTIVE STATEMENT:  Pr. reports leaving his knitting in the car.  next scheduled visit.  Pt accompanied by: self  PERTINENT HISTORY: Pt. underwent an ACDF C4-C7. L5-S1 anterolithesis, and disc degeneration causing marked biforminal L5 impingement, Spinal stenosis from L1-2 to L3-4 with L2-3 being the  most advanced. Bilateral hand tremors. Bilateral Lacunar infarcts in 2020. See above list for PMHx .  PRECAUTIONS: None  WEIGHT BEARING RESTRICTIONS: No  PAIN:  Are you having pain? None reported  FALLS: Has patient fallen in last 6 months? Yes. Number of falls 6-10 falls  LIVING ENVIRONMENT: Lives with: lives with their spouse Lives in: House/apartment 2 levels Stairs: 4-5 steps. Has following equipment at home: Single point cane, Walker - 2 wheeled, and shower chair  PLOF: Independent  PATIENT GOALS:  To improve bilateral hand cooridnation  OBJECTIVE:  Note: Objective measures were completed at Evaluation unless otherwise noted.  HAND DOMINANCE: Right hand dominant  ADLs:  Transfers/ambulation related to ADLs: Eating: feels like hand, and mouth are not working together, drinks drip out of mouth, Difficulty using fork, and knife together for self-feeding. Grooming: Independent brushing teeth UB Dressing: Difficulty buttoning LB Dressing: Difficulty managing pants, shoes, and socks Toileting: Independent Bathing: incoordination with applying shampoo with bilateral hands Tub Shower transfers: Distant supervision   IADLs: Shopping: Delivery Light housekeeping: Difficulty with vacuuming-bending over Meal Prep:  Independent Community mobility:  Independent Medication management: Wife sets up pillbox-Pt. Takes pills independently  Financial management: No changes Handwriting: 75% legible  MOBILITY STATUS: Hx of falls  POSTURE COMMENTS:   Sitting balance: good  ACTIVITY TOLERANCE: Activity tolerance: Fair   UPPER EXTREMITY ROM:    Active ROM Right Eval WFL Left Eval WFL  (Blank rows = not tested)  UPPER EXTREMITY MMT:     MMT Right Eval  Left Eval   Shoulder flexion 4+/5 4+/5  Shoulder abduction 4+/5 4+/5  Shoulder adduction    Shoulder extension    Shoulder internal rotation    Shoulder external rotation    Middle trapezius    Lower trapezius     Elbow flexion 5/5 5/5  Elbow extension 5/5 5/5  Wrist flexion    Wrist extension 5/5 5/5  Wrist ulnar deviation    Wrist radial deviation    Wrist pronation    Wrist supination    (Blank rows = not tested)  HAND FUNCTION: Grip strength: Right: 48 lbs; Left: 44 lbs, Lateral pinch: Right: 17 lbs, Left: 16 lbs, and 3 point pinch: Right: 12 lbs, Left: 19 lbs  COORDINATION: 9 Hole Peg test: Right: 25 sec; Left: 26 sec  SENSATION:  Pt. reports no changes  EDEMA: N/A  MUSCLE TONE: Intact;  Atrophy at the right dorsal interossei muscles.  COGNITION: Overall cognitive status: WFL for tasks performed  VISION:  Wears glasses. No change from baseline   PERCEPTION: intact   OBSERVATIONS: Pt. Left his 2 wheeled scooter in the waiting/reception area  TREATMENT DATE:  12/19/23  Therapeutic Ex.:   -UE strengthening through reciprocal motion using the SciFit for 8 min. On level 4.0 with constant monitoring of the BUEs. Pt. worked on changing, and alternating forward reverse position.   Neuromuscular Re-education:  -Facilitated bilateral [facility] skills constructing, and assembling a small vehicle model.  -Facilitated bilateral [facility] skills grasping the model pieces with one hand, while positioning and stabilizing the the piece within the tips of the opposite hand.  -Facilitated bilateral [facility] skills using small wrench tools while securing the pieces in place with one hand, and using the tools with the opposite hand.  -Dual tasking was incorporated with holding a conversation while working on the bilateral [facility] task.      PATIENT EDUCATION: Education details: OT services, POC, goals, ADL/IADL functional status. Person educated: Patient Education method: Medical illustrator Education comprehension: verbalized understanding and returned  demonstration  HOME EXERCISE PROGRAM: To be assessed, and provided as indicated   GOALS: Goals reviewed with patient? Yes  SHORT TERM GOALS: Target date: 12/19/2023  Pt. Will be independent with HEPs for BUEs  Baseline: Eval: No current HEP Goal status: INITIAL  LONG TERM GOALS: Target date: 01/30/2024  Pt. Will perform buttoning with modified independence Baseline: Eval: Buttoning is difficult Goal status: INITIAL  2.  Pt. Will be able to cut food with Modified independence Baseline: Eval: Pt. Has difficulty using bilateral hand ts cut food. Goal status: INITIAL  3.  Pt. Will improve bilateral UE strength by 2 mm grades to be able to sustain bilateral UEs in elevation while coordinating bilateral hands for shampooing. Baseline: Eval: Pt. Has difficulty coordinating UEs during shampooing. Goal status: INITIAL  4.  Pt. Will demonstrate compensatory/adaptive equipment strategies for LE dressing tasks.  Baseline: Eval:Pt. Has difficulty performing LE dressing   Goal status: INITIAL  5.  Pt. Will identify 2 compensatory strategies for bilateral tremors during ADLs/IADLs Baseline: Eval: Education to be provided Goal status: INITIAL  ASSESSMENT:  CLINICAL IMPRESSION:  Pt. was able to to tolerate BUE strengthening/and reciprocal motion on the SciFit this afternoon. Pt. was able to manipulate and construct the small  model pieces with one hand while securing the pieces to the moveable base with the opposite hand. Pt. was able to use the extra small wrench with one hand, while simultaneously using the small allen wrench to tighten the pieces together. Pt. was able to dual task while consistently maintaining the momentum of using his hands throughout the task. Pt. continues to benefit from OT services to work on improving BUE functioning, and maximize independence with ADLs, and IADL tasks.   PERFORMANCE DEFICITS: in functional skills including ADLs, IADLs, coordination, dexterity, ROM,  strength, pain, Fine motor control, Gross motor control, and UE functional use, cognitive skills including , and psychosocial skills including coping strategies, environmental adaptation, and routines and behaviors.   IMPAIRMENTS: are limiting patient from ADLs, IADLs, and leisure.   CO-MORBIDITIES: may have co-morbidities  that affects occupational performance. Patient will benefit from skilled OT to address above impairments and improve overall function.  MODIFICATION OR ASSISTANCE TO COMPLETE EVALUATION: Min-Moderate modification of tasks or assist with assess necessary to complete an evaluation.  OT OCCUPATIONAL PROFILE AND HISTORY: Detailed assessment: Review of records and additional review of physical, cognitive, psychosocial history related to current functional performance.  CLINICAL DECISION MAKING: Moderate - several treatment options, min-mod task modification necessary  REHAB POTENTIAL: Good  EVALUATION COMPLEXITY: Moderate    PLAN:  OT FREQUENCY: 2x/month  OT DURATION: 12 weeks  PLANNED INTERVENTIONS: 97535 self care/ADL training, 40981 therapeutic exercise, 97530 therapeutic activity, 97112 neuromuscular re-education, 97140 manual therapy, 97018 paraffin, 19147 moist heat, 97034 contrast bath, energy conservation, patient/family education, and DME and/or AE instructions  RECOMMENDED OTHER SERVICES: PT  CONSULTED AND AGREED WITH PLAN OF CARE: Patient  PLAN FOR NEXT SESSION: Treatment  Devine Klingel, MS, OTR/L

## 2023-12-26 ENCOUNTER — Ambulatory Visit: Admitting: Occupational Therapy

## 2023-12-26 ENCOUNTER — Ambulatory Visit: Payer: 59

## 2023-12-26 ENCOUNTER — Ambulatory Visit: Payer: 59 | Admitting: Psychology

## 2023-12-26 DIAGNOSIS — F0670 Mild neurocognitive disorder due to known physiological condition without behavioral disturbance: Secondary | ICD-10-CM | POA: Diagnosis not present

## 2023-12-26 DIAGNOSIS — F840 Autistic disorder: Secondary | ICD-10-CM | POA: Diagnosis not present

## 2023-12-26 DIAGNOSIS — R4681 Obsessive-compulsive behavior: Secondary | ICD-10-CM | POA: Diagnosis not present

## 2023-12-31 ENCOUNTER — Other Ambulatory Visit: Payer: Self-pay | Admitting: Internal Medicine

## 2023-12-31 DIAGNOSIS — I10 Essential (primary) hypertension: Secondary | ICD-10-CM

## 2023-12-31 DIAGNOSIS — G250 Essential tremor: Secondary | ICD-10-CM

## 2023-12-31 DIAGNOSIS — I6381 Other cerebral infarction due to occlusion or stenosis of small artery: Secondary | ICD-10-CM

## 2024-01-02 ENCOUNTER — Ambulatory Visit: Payer: 59

## 2024-01-02 ENCOUNTER — Ambulatory Visit: Admitting: Occupational Therapy

## 2024-01-02 DIAGNOSIS — F0670 Mild neurocognitive disorder due to known physiological condition without behavioral disturbance: Secondary | ICD-10-CM | POA: Diagnosis not present

## 2024-01-02 DIAGNOSIS — R4681 Obsessive-compulsive behavior: Secondary | ICD-10-CM | POA: Diagnosis not present

## 2024-01-02 DIAGNOSIS — F840 Autistic disorder: Secondary | ICD-10-CM | POA: Diagnosis not present

## 2024-01-03 ENCOUNTER — Ambulatory Visit

## 2024-01-03 ENCOUNTER — Encounter

## 2024-01-08 ENCOUNTER — Ambulatory Visit: Attending: Internal Medicine

## 2024-01-08 DIAGNOSIS — I10 Essential (primary) hypertension: Secondary | ICD-10-CM | POA: Diagnosis not present

## 2024-01-08 NOTE — Therapy (Signed)
 OUTPATIENT PHYSICAL THERAPY NEURO TREATMENT     Patient Name: [name] MRN: [id number] DOB:Mar 15, 1961, 63 y.o., male Today's Date: 01/09/2024   PCP: Aileen Alexanders NP REFERRING PROVIDER: Noble Bateman PA  END OF SESSION:  PT End of Session - 01/09/24 1319     Visit Number 37    Number of Visits 43    Date for PT Re-Evaluation 01/16/24    Authorization Type eval 2/21    PT Start Time 1404    PT Stop Time 1444    PT Time Calculation (min) 40 min    Equipment Utilized During Treatment Gait belt    Activity Tolerance Patient tolerated treatment well    Behavior During Therapy [facility] for tasks assessed/performed                                              Past Medical History:  Diagnosis Date   ADHD    Arthritis    Concussion    COVID 12/2020   Hypertension    Multiple lacunar infarcts (HCC)    MVP (mitral valve prolapse)    Pernicious anemia    Rocky [place] spotted fever    Spinal stenosis    Stroke [facility])    Vestibular migraine    Vitamin B12 deficiency    Vitamin D  deficiency    Past Surgical History:  Procedure Laterality Date   ANTERIOR CERVICAL DECOMP/DISCECTOMY FUSION N/A 07/30/2022   Procedure: C4-7 ANTERIOR CERVICAL DISCECTOMY AND FUSION (GLOBUS HEDRON);  Surgeon: Jodeen Munch, MD;  Location: [facility] ORS;  Service: Neurosurgery;  Laterality: N/A;   BILATERAL CARPAL TUNNEL RELEASE     CERVICAL WOUND DEBRIDEMENT N/A 08/01/2022   Procedure: CERVICAL WOUND DEBRIDEMENT OF HEMATOMA;  Surgeon: Jodeen Munch, MD;  Location: [facility] ORS;  Service: Neurosurgery;  Laterality: N/A;   COLONOSCOPY     COLONOSCOPY WITH PROPOFOL  N/A 12/17/2023   Procedure: COLONOSCOPY WITH PROPOFOL ;  Surgeon: Shane Darling, MD;  Location: [facility] ENDOSCOPY;  Service: Endoscopy;  Laterality: N/A;   ESOPHAGOGASTRODUODENOSCOPY (EGD) WITH PROPOFOL  N/A 12/17/2023   Procedure: ESOPHAGOGASTRODUODENOSCOPY (EGD) WITH PROPOFOL ;  Surgeon:  Shane Darling, MD;  Location: [facility] ENDOSCOPY;  Service: Endoscopy;  Laterality: N/A;   FRACTURE SURGERY Left 1984   intramedullary rod   hip bone transplant  1984   LEG SURGERY Left    oseotomy   POLYPECTOMY  12/17/2023   Procedure: POLYPECTOMY, INTESTINE;  Surgeon: Shane Darling, MD;  Location: [facility] ENDOSCOPY;  Service: Endoscopy;;   REPAIR ANKLE LIGAMENT Right 1978   REPAIR KNEE LIGAMENT Bilateral    acl   TRIGGER FINGER RELEASE Bilateral    thumbs   Patient Active Problem List   Diagnosis Date Noted   Lacunar stroke (HCC) 12/12/2023   Essential tremor 12/12/2023   Dizziness 10/30/2023   Postoperative hematoma of musculoskeletal structure following musculoskeletal procedure 08/01/2022   Cervical myelopathy (HCC) 07/30/2022   Cervical spinal stenosis 07/30/2022   Radiculopathy, cervical 07/30/2022   S/P cervical spinal fusion 07/30/2022   Vitamin D  deficiency 06/25/2022   B12 deficiency 06/25/2022   Elevated LDL cholesterol level 06/25/2022   Prediabetes 03/27/2021   History of nausea- reported 01/27/2020 02/02/2020   Accidental fall from ladder 02/02/2020   Acute right-sided thoracic back pain 02/02/2020   Neck stiffness 02/02/2020   Concussion with no loss of consciousness 02/02/2020   Multiple falls 02/02/2020  Head trauma, initial encounter 02/02/2020   Acute post-traumatic headache, not intractable 02/02/2020   Essential hypertension 08/26/2018   Pre-bariatric surgery nutrition evaluation 06/19/2018   Migraine with vertigo 01/05/2014   Arthralgia of multiple joints 04/28/2009   Carpal tunnel syndrome 08/30/2008    ONSET DATE: summer 2022  REFERRING DIAG: LE weakness and gait instability   THERAPY DIAG:  Muscle weakness (generalized)  Unsteadiness on feet  Difficulty in walking, not elsewhere classified  Rationale for Evaluation and Treatment: Rehabilitation  SUBJECTIVE:                                                                                                                                                                                              SUBJECTIVE STATEMENT: Patient reports his leg has been shrinking in size.   Pt accompanied by: self  PERTINENT HISTORY:  Patient presents for LE weakness and gait instability. He is s/p ACDF C-7 on 07/30/22. Patient has a leg length discrepancy and limited L knee flexion since his 20's. Patient has a lot of atrophy and lower back pain, foot drop bilaterally.  First symptoms was stumbling up stairs, with LLE. Foot drop was improved after ACDF. Now has numbness and tingling at the end of the day. Does have a few lesions in his pons per wife, on blood pressure medication now. PMH includes ADHD, arthritis, concussion COVID, HTN, multiple lacunar infarcts, MVP, pernicious anemia, rocky mountain spotted fever, spinal stenosis, vestibular migraine, vitamin B deficiency.   PAIN:  Are you having pain? Denies pain   PRECAUTIONS: Other: cervical; no lifting more than 20 lb   WEIGHT BEARING RESTRICTIONS: No  FALLS: Has patient fallen in last 6 months? Yes. Number of falls multiple a week  LIVING ENVIRONMENT: Lives with: lives with their family Lives in: House/apartment Stairs: Yes: Internal: flight steps; is sleeping downstairs Has following equipment at home: Single point cane  PLOF: Independent, uses a walking stick  PATIENT GOALS: gait control of feet, strengthen legs, balance, atrophy in hand  OBJECTIVE:   DIAGNOSTIC FINDINGS:  09/11/22: No acute fracture. No spondylolisthesis. No compression deformities. Disc space narrowing C3-4 with marginal osteophytes consistent with degenerative disc disease. Postop changes ACDF C4-C7 with anatomic alignment. Normal prevertebral and cervicocranial soft tissues.   06/19/22: 1. L5 chronic pars defects with L5-S1 anterolisthesis and advanced disc degeneration causing marked biforaminal L5 impingement. 2. Degeneration and short pedicles  causes compressive spinal stenosis from L1-2 to L3-4, most advanced at L2-3. Moderate bilateral foraminal narrowing at the same levels.   COGNITION: Overall cognitive status: Within functional limits for tasks assessed    COORDINATION: Limited LLE  MUSCLE LENGTH: Significant limitation L hamstring; 60 degree flexion deficit.   POSTURE: weight shift to the R  LOWER EXTREMITY ROM:     L knee flexion contraction -60 contraction LOWER EXTREMITY MMT:    MMT Right Eval R 5/15 R 8/22 Left Eval L 5/15 L 8/22  Hip flexion 4 5 5 4  4+ 4  Hip extension        Hip abduction 4- 4 4 4- 4 4  Hip adduction 4- 4 4 4- 4- 4  Hip internal rotation        Hip external rotation        Knee flexion 4- 4 4 3  4- 4-  Knee extension 4 4+ 5 4 4  4-  Ankle dorsiflexion 3 3 3+ 2+ 2+ 2+  Ankle plantarflexion 3 3+ 4- 3 3+ 3+  Ankle inversion        Ankle eversion        (Blank rows = not tested)  01/02/23 Right 8/22: RECERT  10/17 PN Left 8/22: Recert LUE 10/17: PN   Shoulder Flexion 4 4+ 5 4- 4+ 4+  Shoulder Abduction 4- 4 4+ 4- 4 4+  Shoulder Extension 4- 4+ 4 4- 4   Shoulder adduction 4- 4  3+ 4 4+  Bicep  4 4+ 5 4 4+ 4  Triceps 4- 4- 5 3+ 4 4     Right Left  Hip flexion 12.2 10.2  Hip Abduction 7.4 7.0  Hip Adduction 10.8 10.5  Knee Extension  13.8 13.4  Knee Flexion 13.4 13.2  DF    PF       BED MOBILITY:  Perform next session  TRANSFERS: Assistive device utilized: Single point cane  Sit to stand: CGA Stand to sit: CGA Chair to chair: CGA   GAIT: Gait pattern: L knee limited extension, L foot drop, L foot eversion Distance walked: 1200 Assistive device utilized: Single point cane Level of assistance: CGA Comments: Patient has increased eversion and rolling of L foot, LLE leg length discrepancy.   FUNCTIONAL TESTS:  5 times sit to stand: 5.6 seconds with minor use of LLE no UE support Timed up and go (TUG): n/a 6 minute walk test: 1200 ft 10 meter walk test: 7  seconds BERG: 36/56   PATIENT SURVEYS:  FOTO 54  TODAY'S TREATMENT:                                                                                                                              DATE: 01/09/24 Unless otherwise stated, CGA was provided and gait belt donned in order to ensure pt safety    TherAct: In // bars: Walk forward/backwards without UE support 8x length Lateral step 8x length of bars; cue for visual feedback     TherEx: Dynadisc: -df/pf 20x each LE -inversion/eversion 20x each LE -circles 20x each direction for 40x each LE  Seated heel raises 15x   Neuro Re-ed:  balance beam: -  lateral step with UE support 6x with cue for visual field -tandem walk with UE support 8x -static stand 60 seconds  -pvc pipe chest press 10x, overhead press 10x     PATIENT EDUCATION: Education details: goals, POC, educated on safety during updated exercises  Person educated: Patient and Spouse Education method: Explanation, Demonstration, Tactile cues, and Verbal cues Education comprehension: verbalized understanding, returned demonstration, verbal cues required, and tactile cues required  HOME EXERCISE PROGRAM: Access Code: [email or web address] URL: [email or web address] Date: 10/11/2022 Prepared by: Neytiri Asche  Exercises - Seated Ankle Alphabet  - 1 x daily - 7 x weekly - 2 sets - 10 reps - 5 hold - Seated Heel Toe Raises  - 1 x daily - 7 x weekly - 2 sets - 10 reps - 5 hold - Standing March with Counter Support  - 1 x daily - 7 x weekly - 2 sets - 10 reps - 5 hold  Access Code: [email or web address] URL: [email or web address] Date: 10/30/2022 Prepared by: Inioluwa Boulay  Exercises - Seated Ankle Plantarflexion with Resistance  - 1 x daily - 7 x weekly - 2 sets - 10 reps - 5 hold - Seated Ankle Eversion with Resistance  - 1 x daily - 7 x weekly - 2 sets - 10 reps - 5 hold  Access Code: [email or web address] URL: [email or web address] Date:  11/14/2022 Prepared by: Rishi Vicario  Exercises - Seated Heel Toe Raises  - 1 x daily - 7 x weekly - 2 sets - 10 reps - 5 hold - Seated Hip Internal Rotation AROM  - 1 x daily - 7 x weekly - 2 sets - 10 reps - 5 hold - Bilateral Long Arc Quad  - 1 x daily - 7 x weekly - 2 sets - 10 reps - 5 hold - Foam Balance Tandem stance  - 1 x daily - 7 x weekly - 2 sets - 2 reps - 30 hold - Single Leg Balance on Foam  - 1 x daily - 7 x weekly - 2 sets - 30 reps - 2 hold - Seated Hip Flexion March with Ankle Weights  - 1 x daily - 7 x weekly - 2 sets - 10 reps - 5 hold - Standing Hip Abduction with Ankle Weight  - 1 x daily - 7 x weekly - 2 sets - 10 reps - 5 hold - Standing Hip Extension with Ankle Weight  - 1 x daily - 7 x weekly - 2 sets - 10 reps - 5 hold - Standing Hip Flexion with Ankle Weight  - 1 x daily - 7 x weekly - 2 sets - 10 reps - 5 hold - Seated Long Arc Quad with Ankle Weight  - 1 x daily - 7 x weekly - 2 sets - 10 reps - 5 hold Access Code: [email or web address] URL: [email or web address] Date: 08/22/2023 Prepared by: Lisia Westbay  Exercises - Seated Finger Tip Pinch with Putty  - 1 x daily - 7 x weekly - 2 sets - 10 reps - 5 hold - Hand Squeezes  - 1 x daily - 7 x weekly - 2 sets - 10 reps - 5 hold - Key Pinch with Putty  - 1 x daily - 7 x weekly - 2 sets - 10 reps - 5 hold  GOALS: Goals reviewed with patient? Yes  SHORT TERM GOALS: Target date: 05/09/2023   Patient will be independent in home exercise program to improve strength/mobility for better functional independence with ADLs. Baseline: 2/21: HEP give  next session 5/15 : intermittent compliance 8/22: intermittent compliance due to illness 11/14: compliant  Goal status: MET    LONG TERM GOALS: Target date: 01/16/2024    Patient will increase FOTO score to equal to or greater than  64%   to demonstrate statistically significant improvement in mobility and quality of life.  Baseline: 2/21: 54% 8/22: 52% 10/17: 57%  11/14: 53% 1/16: 47% Goal status: terminated  2.  Patient will increase Berg Balance score by > 6 points (42/56)to demonstrate decreased fall risk during functional activities. Baseline: 2/21: 36/56 5/15: 45   Goal status: MET  3.  Patient will increase six minute walk test distance to >1500 for progression to age norm community ambulator and improve gait ability Baseline: 2/21: 1200 ft with [facility] 5/15: 1300 ft with [facility]  8/22: 905 ft with [facility] 10/17: 1100 ft with [facility] 11/14: 1110 ft with [facility] 1/16: 1210 ft with [facility] 3/6: 1100 ft with two canes Goal status: In progress  4.  Patient will increase BLE gross strength to 4+/5  or within 1 lb of each LE as to improve functional strength for independent gait, increased standing tolerance and increased ADL ability. Baseline: 2/21 :see above 5/15: see above 10/17: RLE 5/5 LLE 4/5 L ankle 3/5  11/14: RLE 5/5 LLE 4+/5 hamstring 4/5, ankle: 3+/5 1/16: RLE 5/5 LLE 4+/5 ankle 4-/5 3/6: see above  Goal status:  In progress / modified  6.  Patient will increase Berg Balance score by > 6 points (51/56) to demonstrate decreased fall risk during functional activities. Baseline:  5/15: 45 8/22: 41/56 10/17: 28 11/14: 36 1/16: 36 3/6: 25 Goal status: Ongoing  7.   Patient will increase UE strength to >4+/5 for ADLs, AD usage, and quality of life.  Baseline: 5/15: see above 8/22: see above 11/14: >4+/5 bilaterally  Goal status: MET  8.   Patient will increase lower extremity functional scale to >60/80 to demonstrate improved functional mobility and increased tolerance with ADLs.  Baseline: 11/14: 29%  1/16: 29%  3/6: 19%  Goal status: ongoing   ASSESSMENT:  CLINICAL IMPRESSION: Patient has had significant muscle wasting of his R calf that has worsened over time, advised to contact neurologist for further workup. Ankle and calf strengthening tolerated well with fatigue. Unstable surfaces continue to be an area of progress for patient to focus on.   Patient will  benefit from skilled physical therapy to improve strength, mobility, and stability for improved quality of life.   OBJECTIVE IMPAIRMENTS: Abnormal gait, decreased activity tolerance, decreased balance, decreased coordination, decreased endurance, decreased mobility, difficulty walking, decreased ROM, decreased strength, hypomobility, impaired flexibility, improper body mechanics, and postural dysfunction.   ACTIVITY LIMITATIONS: carrying, lifting, bending, standing, squatting, stairs, transfers, reach over head, locomotion level, and caring for others  PARTICIPATION LIMITATIONS: meal prep, cleaning, laundry, personal finances, driving, shopping, community activity, and yard work  PERSONAL FACTORS: Age, Past/current experiences, Time since onset of injury/illness/exacerbation, and 3+ comorbidities: DHD, arthritis, concussion COVID, HTN, multiple lacunar infarcts, MVP, pernicious anemia, rocky mountain spotted fever, spinal stenosis, vestibular migraine, vitamin B deficiency.  are also affecting patient's functional outcome.   REHAB POTENTIAL: Good  CLINICAL DECISION MAKING: Evolving/moderate complexity  EVALUATION COMPLEXITY: Moderate  PLAN:   PT FREQUENCY: 1x/week  PT DURATION: 12 weeks  PLANNED INTERVENTIONS: Therapeutic exercises, Therapeutic activity, Neuromuscular re-education, Balance training, Gait training, Patient/Family education, Self Care, Joint mobilization, Stair training, Vestibular training, Canalith repositioning, Visual/preceptual remediation/compensation, Orthotic/Fit training, DME instructions, Cognitive remediation, Electrical stimulation,  Spinal mobilization, Cryotherapy, Moist heat, Splintting, Taping, Traction, Ultrasound, Manual therapy, and Re-evaluation  PLAN FOR NEXT SESSION:  balance, strength, unstable surfaces    Tamiya Colello  Brain Cahill, PT, DPT Physical Therapist - [facility]  Outpatient Physical Therapy- Main Campus [phone]

## 2024-01-09 ENCOUNTER — Ambulatory Visit: Admitting: Occupational Therapy

## 2024-01-09 ENCOUNTER — Encounter: Payer: Self-pay | Admitting: Neurosurgery

## 2024-01-09 ENCOUNTER — Ambulatory Visit: Payer: Self-pay | Admitting: Internal Medicine

## 2024-01-09 ENCOUNTER — Ambulatory Visit: Payer: 59

## 2024-01-09 DIAGNOSIS — M6281 Muscle weakness (generalized): Secondary | ICD-10-CM

## 2024-01-09 DIAGNOSIS — F0670 Mild neurocognitive disorder due to known physiological condition without behavioral disturbance: Secondary | ICD-10-CM | POA: Diagnosis not present

## 2024-01-09 DIAGNOSIS — R278 Other lack of coordination: Secondary | ICD-10-CM | POA: Diagnosis not present

## 2024-01-09 DIAGNOSIS — F840 Autistic disorder: Secondary | ICD-10-CM | POA: Diagnosis not present

## 2024-01-09 DIAGNOSIS — R262 Difficulty in walking, not elsewhere classified: Secondary | ICD-10-CM

## 2024-01-09 DIAGNOSIS — R2681 Unsteadiness on feet: Secondary | ICD-10-CM

## 2024-01-09 DIAGNOSIS — R4681 Obsessive-compulsive behavior: Secondary | ICD-10-CM | POA: Diagnosis not present

## 2024-01-10 ENCOUNTER — Encounter: Payer: Self-pay | Admitting: Occupational Therapy

## 2024-01-10 NOTE — Therapy (Signed)
 OUTPATIENT OCCUPATIONAL THERAPY NEURO TREATMENT NOTE  Patient Name: [name] MRN: [id number] DOB:03-10-1961, 63 y.o., male Today's Date: 01/10/2024  PCP: Aileen Alexanders, NP REFERRING PROVIDER: Devora Folks, Linnell Richardson, MD  END OF SESSION:  OT End of Session - 01/10/24 0924     Visit Number 7    Number of Visits 24    Date for OT Re-Evaluation 01/30/24    OT Start Time 1318    OT Stop Time 1400    OT Time Calculation (min) 42 min    Activity Tolerance Patient tolerated treatment well    Behavior During Therapy [facility] for tasks assessed/performed              Past Medical History:  Diagnosis Date   ADHD    Arthritis    Concussion    COVID 12/2020   Hypertension    Multiple lacunar infarcts (HCC)    MVP (mitral valve prolapse)    Pernicious anemia    [place] spotted fever    Spinal stenosis    Stroke [facility])    Vestibular migraine    Vitamin B12 deficiency    Vitamin D  deficiency    Past Surgical History:  Procedure Laterality Date   ANTERIOR CERVICAL DECOMP/DISCECTOMY FUSION N/A 07/30/2022   Procedure: C4-7 ANTERIOR CERVICAL DISCECTOMY AND FUSION (GLOBUS HEDRON);  Surgeon: Jodeen Munch, MD;  Location: [facility] ORS;  Service: Neurosurgery;  Laterality: N/A;   BILATERAL CARPAL TUNNEL RELEASE     CERVICAL WOUND DEBRIDEMENT N/A 08/01/2022   Procedure: CERVICAL WOUND DEBRIDEMENT OF HEMATOMA;  Surgeon: Jodeen Munch, MD;  Location: [facility] ORS;  Service: Neurosurgery;  Laterality: N/A;   COLONOSCOPY     COLONOSCOPY WITH PROPOFOL  N/A 12/17/2023   Procedure: COLONOSCOPY WITH PROPOFOL ;  Surgeon: Shane Darling, MD;  Location: [facility] ENDOSCOPY;  Service: Endoscopy;  Laterality: N/A;   ESOPHAGOGASTRODUODENOSCOPY (EGD) WITH PROPOFOL  N/A 12/17/2023   Procedure: ESOPHAGOGASTRODUODENOSCOPY (EGD) WITH PROPOFOL ;  Surgeon: Shane Darling, MD;  Location: [facility] ENDOSCOPY;  Service: Endoscopy;  Laterality: N/A;   FRACTURE SURGERY Left 1984   intramedullary rod   hip  bone transplant  1984   LEG SURGERY Left    oseotomy   POLYPECTOMY  12/17/2023   Procedure: POLYPECTOMY, INTESTINE;  Surgeon: Shane Darling, MD;  Location: [facility] ENDOSCOPY;  Service: Endoscopy;;   REPAIR ANKLE LIGAMENT Right 1978   REPAIR KNEE LIGAMENT Bilateral    acl   TRIGGER FINGER RELEASE Bilateral    thumbs   Patient Active Problem List   Diagnosis Date Noted   Lacunar stroke (HCC) 12/12/2023   Essential tremor 12/12/2023   Dizziness 10/30/2023   Postoperative hematoma of musculoskeletal structure following musculoskeletal procedure 08/01/2022   Cervical myelopathy (HCC) 07/30/2022   Cervical spinal stenosis 07/30/2022   Radiculopathy, cervical 07/30/2022   S/P cervical spinal fusion 07/30/2022   Vitamin D  deficiency 06/25/2022   B12 deficiency 06/25/2022   Elevated LDL cholesterol level 06/25/2022   Prediabetes 03/27/2021   History of nausea- reported 01/27/2020 02/02/2020   Accidental fall from ladder 02/02/2020   Acute right-sided thoracic back pain 02/02/2020   Neck stiffness 02/02/2020   Concussion with no loss of consciousness 02/02/2020   Multiple falls 02/02/2020   Head trauma, initial encounter 02/02/2020   Acute post-traumatic headache, not intractable 02/02/2020   Essential hypertension 08/26/2018   Pre-bariatric surgery nutrition evaluation 06/19/2018   Migraine with vertigo 01/05/2014   Arthralgia of multiple joints 04/28/2009   Carpal tunnel syndrome 08/30/2008    ONSET DATE: 07/30/2022  REFERRING DIAG: ACDF, Hx of CVAs  THERAPY DIAG:  Muscle weakness (generalized)  Rationale for Evaluation and Treatment: Rehabilitation  SUBJECTIVE:   SUBJECTIVE STATEMENT:  Pr. reports that his wife and daughter have been travelling.   next scheduled visit.  Pt accompanied by: self  PERTINENT HISTORY: Pt. underwent an ACDF C4-C7. L5-S1 anterolithesis, and disc degeneration causing marked biforminal L5 impingement, Spinal stenosis from L1-2 to L3-4 with  L2-3 being the most advanced. Bilateral hand tremors. Bilateral Lacunar infarcts in 2020. See above list for PMHx .  PRECAUTIONS: None  WEIGHT BEARING RESTRICTIONS: No  PAIN:  Are you having pain? None reported  FALLS: Has patient fallen in last 6 months? Yes. Number of falls 6-10 falls  LIVING ENVIRONMENT: Lives with: lives with their spouse Lives in: House/apartment 2 levels Stairs: 4-5 steps. Has following equipment at home: Single point cane, Walker - 2 wheeled, and shower chair  PLOF: Independent  PATIENT GOALS:  To improve bilateral hand cooridnation  OBJECTIVE:  Note: Objective measures were completed at Evaluation unless otherwise noted.  HAND DOMINANCE: Right hand dominant  ADLs:  Transfers/ambulation related to ADLs: Eating: feels like hand, and mouth are not working together, drinks drip out of mouth, Difficulty using fork, and knife together for self-feeding. Grooming: Independent brushing teeth UB Dressing: Difficulty buttoning LB Dressing: Difficulty managing pants, shoes, and socks Toileting: Independent Bathing: incoordination with applying shampoo with bilateral hands Tub Shower transfers: Distant supervision   IADLs: Shopping: Delivery Light housekeeping: Difficulty with vacuuming-bending over Meal Prep:  Independent Community mobility:  Independent Medication management: Wife sets up pillbox-Pt. Takes pills independently  Financial management: No changes Handwriting: 75% legible  MOBILITY STATUS: Hx of falls  POSTURE COMMENTS:   Sitting balance: good  ACTIVITY TOLERANCE: Activity tolerance: Fair   UPPER EXTREMITY ROM:    Active ROM Right Eval WFL Left Eval WFL  (Blank rows = not tested)  UPPER EXTREMITY MMT:     MMT Right Eval  Left Eval   Shoulder flexion 4+/5 4+/5  Shoulder abduction 4+/5 4+/5  Shoulder adduction    Shoulder extension    Shoulder internal rotation    Shoulder external rotation    Middle trapezius     Lower trapezius    Elbow flexion 5/5 5/5  Elbow extension 5/5 5/5  Wrist flexion    Wrist extension 5/5 5/5  Wrist ulnar deviation    Wrist radial deviation    Wrist pronation    Wrist supination    (Blank rows = not tested)  HAND FUNCTION: Grip strength: Right: 48 lbs; Left: 44 lbs, Lateral pinch: Right: 17 lbs, Left: 16 lbs, and 3 point pinch: Right: 12 lbs, Left: 19 lbs  COORDINATION: 9 Hole Peg test: Right: 25 sec; Left: 26 sec  SENSATION:  Pt. reports no changes  EDEMA: N/A  MUSCLE TONE: Intact;  Atrophy at the right dorsal interossei muscles.  COGNITION: Overall cognitive status: WFL for tasks performed  VISION:  Wears glasses. No change from baseline   PERCEPTION: intact   OBSERVATIONS: Pt. Left his 2 wheeled scooter in the waiting/reception area  TREATMENT DATE:  01/09/24  Therapeutic Ex.:   -UE strengthening through reciprocal motion using the Sci[employer] for 8 min. On level 4.0 with constant monitoring of the BUEs. Pt. worked on changing, and alternating forward reverse position.   Therapeutic Activities:   -Progressive gross grip strengthening with 17.9# of grip strength resistive force.  -Incorporated reaching in multiple planes to further challenge the task to work towards improving sustained overhead reach while performing hair care.  -Lateral, and 3pt. Pinch strengthening using yellow, red, green, and blue, and black level resistive clips  -Facilitated translatory movements moving clips from the lateral pinch position to the 3pt. Pinch position in preparation for securely placing them on the dowel.       PATIENT EDUCATION: Education details: OT services, POC, goals, ADL/IADL functional status. Person educated: Patient Education method: Medical illustrator Education comprehension: verbalized understanding and returned  demonstration  HOME EXERCISE PROGRAM:   Hand strengthening with green theraputtty.   GOALS: Goals reviewed with patient? Yes  SHORT TERM GOALS: Target date: 12/19/2023  Pt. Will be independent with HEPs for BUEs  Baseline: Eval: No current HEP Goal status: INITIAL  LONG TERM GOALS: Target date: 01/30/2024  Pt. Will perform buttoning with modified independence Baseline: Eval: Buttoning is difficult Goal status: INITIAL  2.  Pt. Will be able to cut food with Modified independence Baseline: Eval: Pt. Has difficulty using bilateral hand ts cut food. Goal status: INITIAL  3.  Pt. Will improve bilateral UE strength by 2 mm grades to be able to sustain bilateral UEs in elevation while coordinating bilateral hands for shampooing. Baseline: Eval: Pt. Has difficulty coordinating UEs during shampooing. Goal status: INITIAL  4.  Pt. Will demonstrate compensatory/adaptive equipment strategies for LE dressing tasks.  Baseline: Eval:Pt. Has difficulty performing LE dressing   Goal status: INITIAL  5.  Pt. Will identify 2 compensatory strategies for bilateral tremors during ADLs/IADLs Baseline: Eval: Education to be provided Goal status: INITIAL  ASSESSMENT:  CLINICAL IMPRESSION:  Pt. continues to tolerate therapy well. Pt. Requires visual cues for demonstration of  the proper technique for each task. Pt. was able to to perform the UE activities while dual tasking with carrying on a conversation. Pt. Is improving with BUE functional reaching, and works continues to work towards improving sustained BUE use during ADL tasks. Pt. continues to benefit from OT services to work on improving BUE functioning, and maximize independence with ADLs, and IADL tasks.   PERFORMANCE DEFICITS: in functional skills including ADLs, IADLs, coordination, dexterity, ROM, strength, pain, Fine motor control, Gross motor control, and UE functional use, cognitive skills including , and psychosocial skills including  coping strategies, environmental adaptation, and routines and behaviors.   IMPAIRMENTS: are limiting patient from ADLs, IADLs, and leisure.   CO-MORBIDITIES: may have co-morbidities  that affects occupational performance. Patient will benefit from skilled OT to address above impairments and improve overall function.  MODIFICATION OR ASSISTANCE TO COMPLETE EVALUATION: Min-Moderate modification of tasks or assist with assess necessary to complete an evaluation.  OT OCCUPATIONAL PROFILE AND HISTORY: Detailed assessment: Review of records and additional review of physical, cognitive, psychosocial history related to current functional performance.  CLINICAL DECISION MAKING: Moderate - several treatment options, min-mod task modification necessary  REHAB POTENTIAL: Good  EVALUATION COMPLEXITY: Moderate    PLAN:  OT FREQUENCY: 2x/month  OT DURATION: 12 weeks  PLANNED INTERVENTIONS: 97535 self care/ADL training, 16109 therapeutic exercise, 97530 therapeutic activity, 97112 neuromuscular re-education, 97140 manual therapy, 97018 paraffin, 60454 moist heat, 09811  contrast bath, energy conservation, patient/family education, and DME and/or AE instructions  RECOMMENDED OTHER SERVICES: PT  CONSULTED AND AGREED WITH PLAN OF CARE: Patient  PLAN FOR NEXT SESSION: Treatment  Nasiah Lehenbauer, MS, OTR/L

## 2024-01-11 ENCOUNTER — Encounter: Payer: Self-pay | Admitting: Internal Medicine

## 2024-01-11 LAB — BASIC METABOLIC PANEL WITH GFR
BUN/Creatinine Ratio: 14 (ref 10–24)
BUN: 12 mg/dL (ref 8–27)
CO2: 19 mmol/L — ABNORMAL LOW (ref 20–29)
Calcium: 9.6 mg/dL (ref 8.6–10.2)
Chloride: 100 mmol/L (ref 96–106)
Creatinine, Ser: 0.84 mg/dL (ref 0.76–1.27)
Glucose: 97 mg/dL (ref 70–99)
Potassium: 4.4 mmol/L (ref 3.5–5.2)
Sodium: 140 mmol/L (ref 134–144)
eGFR: 98 mL/min/{1.73_m2} (ref >59)

## 2024-01-15 ENCOUNTER — Ambulatory Visit: Attending: Internal Medicine

## 2024-01-15 ENCOUNTER — Other Ambulatory Visit

## 2024-01-15 ENCOUNTER — Encounter

## 2024-01-15 ENCOUNTER — Encounter: Payer: Self-pay | Admitting: Gastroenterology

## 2024-01-15 ENCOUNTER — Ambulatory Visit (INDEPENDENT_AMBULATORY_CARE_PROVIDER_SITE_OTHER)

## 2024-01-15 DIAGNOSIS — I6381 Other cerebral infarction due to occlusion or stenosis of small artery: Secondary | ICD-10-CM | POA: Diagnosis not present

## 2024-01-15 DIAGNOSIS — R634 Abnormal weight loss: Secondary | ICD-10-CM

## 2024-01-15 DIAGNOSIS — R1032 Left lower quadrant pain: Secondary | ICD-10-CM

## 2024-01-15 DIAGNOSIS — I10 Essential (primary) hypertension: Secondary | ICD-10-CM | POA: Diagnosis not present

## 2024-01-15 LAB — ECHOCARDIOGRAM COMPLETE
AR max vel: 2.64 cm2
AV Area VTI: 2.49 cm2
AV Area mean vel: 2.46 cm2
AV Mean grad: 3 mmHg
AV Peak grad: 6.6 mmHg
Ao pk vel: 1.28 m/s
Area-P 1/2: 5.13 cm2
Calc EF: 64.5 %
S' Lateral: 2.5 cm
Single Plane A2C EF: 62.2 %
Single Plane A4C EF: 68.1 %

## 2024-01-15 NOTE — Therapy (Signed)
 OUTPATIENT PHYSICAL THERAPY NEURO TREATMENT/RECERT     Patient Name: [name] MRN: [id number] DOB:21-Feb-1961, 63 y.o., male Today's Date: 01/16/2024   PCP: Aileen Alexanders NP REFERRING PROVIDER: Noble Bateman PA  END OF SESSION:  PT End of Session - 01/16/24 1420     Visit Number 38    Number of Visits 50    Date for PT Re-Evaluation 04/09/24    Authorization Type eval 2/21    PT Start Time 1402    PT Stop Time 1444    PT Time Calculation (min) 42 min    Equipment Utilized During Treatment Gait belt    Activity Tolerance Patient tolerated treatment well    Behavior During Therapy [facility] for tasks assessed/performed                                               Past Medical History:  Diagnosis Date   ADHD    Arthritis    Concussion    COVID 12/2020   Hypertension    Multiple lacunar infarcts (HCC)    MVP (mitral valve prolapse)    Pernicious anemia    Rocky Mountain spotted fever    Spinal stenosis    Stroke [facility])    Vestibular migraine    Vitamin B12 deficiency    Vitamin D  deficiency    Past Surgical History:  Procedure Laterality Date   ANTERIOR CERVICAL DECOMP/DISCECTOMY FUSION N/A 07/30/2022   Procedure: C4-7 ANTERIOR CERVICAL DISCECTOMY AND FUSION (GLOBUS HEDRON);  Surgeon: Jodeen Munch, MD;  Location: [facility] ORS;  Service: Neurosurgery;  Laterality: N/A;   BILATERAL CARPAL TUNNEL RELEASE     CERVICAL WOUND DEBRIDEMENT N/A 08/01/2022   Procedure: CERVICAL WOUND DEBRIDEMENT OF HEMATOMA;  Surgeon: Jodeen Munch, MD;  Location: [facility] ORS;  Service: Neurosurgery;  Laterality: N/A;   COLONOSCOPY     COLONOSCOPY WITH PROPOFOL  N/A 12/17/2023   Procedure: COLONOSCOPY WITH PROPOFOL ;  Surgeon: Shane Darling, MD;  Location: [facility] ENDOSCOPY;  Service: Endoscopy;  Laterality: N/A;   ESOPHAGOGASTRODUODENOSCOPY (EGD) WITH PROPOFOL  N/A 12/17/2023   Procedure: ESOPHAGOGASTRODUODENOSCOPY (EGD) WITH PROPOFOL ;   Surgeon: Shane Darling, MD;  Location: [facility] ENDOSCOPY;  Service: Endoscopy;  Laterality: N/A;   FRACTURE SURGERY Left 1984   intramedullary rod   hip bone transplant  1984   LEG SURGERY Left    oseotomy   POLYPECTOMY  12/17/2023   Procedure: POLYPECTOMY, INTESTINE;  Surgeon: Shane Darling, MD;  Location: [facility] ENDOSCOPY;  Service: Endoscopy;;   REPAIR ANKLE LIGAMENT Right 1978   REPAIR KNEE LIGAMENT Bilateral    acl   TRIGGER FINGER RELEASE Bilateral    thumbs   Patient Active Problem List   Diagnosis Date Noted   Lacunar stroke (HCC) 12/12/2023   Essential tremor 12/12/2023   Dizziness 10/30/2023   Postoperative hematoma of musculoskeletal structure following musculoskeletal procedure 08/01/2022   Cervical myelopathy (HCC) 07/30/2022   Cervical spinal stenosis 07/30/2022   Radiculopathy, cervical 07/30/2022   S/P cervical spinal fusion 07/30/2022   Vitamin D  deficiency 06/25/2022   B12 deficiency 06/25/2022   Elevated LDL cholesterol level 06/25/2022   Prediabetes 03/27/2021   History of nausea- reported 01/27/2020 02/02/2020   Accidental fall from ladder 02/02/2020   Acute right-sided thoracic back pain 02/02/2020   Neck stiffness 02/02/2020   Concussion with no loss of consciousness 02/02/2020   Multiple falls 02/02/2020  Head trauma, initial encounter 02/02/2020   Acute post-traumatic headache, not intractable 02/02/2020   Essential hypertension 08/26/2018   Pre-bariatric surgery nutrition evaluation 06/19/2018   Migraine with vertigo 01/05/2014   Arthralgia of multiple joints 04/28/2009   Carpal tunnel syndrome 08/30/2008    ONSET DATE: summer 2022  REFERRING DIAG: LE weakness and gait instability   THERAPY DIAG:  Muscle weakness (generalized)  Unsteadiness on feet  Difficulty in walking, not elsewhere classified  Rationale for Evaluation and Treatment: Rehabilitation  SUBJECTIVE:                                                                                                                                                                                              SUBJECTIVE STATEMENT: Patient saw neurologist today and will have a comprehensive blood panel. Reports he is able to his shoes on/off easier now. Reports he is getting around better nowdays. Reports his calves continue to be wasting away.   Pt accompanied by: self  PERTINENT HISTORY:  Patient presents for LE weakness and gait instability. He is s/p ACDF C-7 on 07/30/22. Patient has a leg length discrepancy and limited L knee flexion since his 20's. Patient has a lot of atrophy and lower back pain, foot drop bilaterally.  First symptoms was stumbling up stairs, with LLE. Foot drop was improved after ACDF. Now has numbness and tingling at the end of the day. Does have a few lesions in his pons per wife, on blood pressure medication now. PMH includes ADHD, arthritis, concussion COVID, HTN, multiple lacunar infarcts, MVP, pernicious anemia, rocky mountain spotted fever, spinal stenosis, vestibular migraine, vitamin B deficiency.   PAIN:  Are you having pain? Denies pain   PRECAUTIONS: Other: cervical; no lifting more than 20 lb   WEIGHT BEARING RESTRICTIONS: No  FALLS: Has patient fallen in last 6 months? Yes. Number of falls multiple a week  LIVING ENVIRONMENT: Lives with: lives with their family Lives in: House/apartment Stairs: Yes: Internal: flight steps; is sleeping downstairs Has following equipment at home: Single point cane  PLOF: Independent, uses a walking stick  PATIENT GOALS: gait control of feet, strengthen legs, balance, atrophy in hand  OBJECTIVE:   DIAGNOSTIC FINDINGS:  09/11/22: No acute fracture. No spondylolisthesis. No compression deformities. Disc space narrowing C3-4 with marginal osteophytes consistent with degenerative disc disease. Postop changes ACDF C4-C7 with anatomic alignment. Normal prevertebral and cervicocranial soft tissues.    06/19/22: 1. L5 chronic pars defects with L5-S1 anterolisthesis and advanced disc degeneration causing marked biforaminal L5 impingement. 2. Degeneration and short pedicles causes compressive spinal stenosis from L1-2 to L3-4, most advanced at  L2-3. Moderate bilateral foraminal narrowing at the same levels.   COGNITION: Overall cognitive status: Within functional limits for tasks assessed    COORDINATION: Limited LLE  MUSCLE LENGTH: Significant limitation L hamstring; 60 degree flexion deficit.   POSTURE: weight shift to the R  LOWER EXTREMITY ROM:     L knee flexion contraction -60 contraction LOWER EXTREMITY MMT:    MMT Right Eval R 5/15 R 8/22 Left Eval L 5/15 L 8/22  Hip flexion 4 5 5 4  4+ 4  Hip extension        Hip abduction 4- 4 4 4- 4 4  Hip adduction 4- 4 4 4- 4- 4  Hip internal rotation        Hip external rotation        Knee flexion 4- 4 4 3  4- 4-  Knee extension 4 4+ 5 4 4  4-  Ankle dorsiflexion 3 3 3+ 2+ 2+ 2+  Ankle plantarflexion 3 3+ 4- 3 3+ 3+  Ankle inversion        Ankle eversion        (Blank rows = not tested)  01/02/23 Right 8/22: RECERT  10/17 PN Left 8/22: Recert LUE 10/17: PN   Shoulder Flexion 4 4+ 5 4- 4+ 4+  Shoulder Abduction 4- 4 4+ 4- 4 4+  Shoulder Extension 4- 4+ 4 4- 4   Shoulder adduction 4- 4  3+ 4 4+  Bicep  4 4+ 5 4 4+ 4  Triceps 4- 4- 5 3+ 4 4     Right Left  Hip flexion 12.2 10.2  Hip Abduction 7.4 7.0  Hip Adduction 10.8 10.5  Knee Extension  13.8 13.4  Knee Flexion 13.4 13.2  DF    PF       BED MOBILITY:  Perform next session  TRANSFERS: Assistive device utilized: Single point cane  Sit to stand: CGA Stand to sit: CGA Chair to chair: CGA   GAIT: Gait pattern: L knee limited extension, L foot drop, L foot eversion Distance walked: 1200 Assistive device utilized: Single point cane Level of assistance: CGA Comments: Patient has increased eversion and rolling of L foot, LLE leg length discrepancy.    FUNCTIONAL TESTS:  5 times sit to stand: 5.6 seconds with minor use of LLE no UE support Timed up and go (TUG): n/a 6 minute walk test: 1200 ft 10 meter walk test: 7 seconds BERG: 36/56   PATIENT SURVEYS:  FOTO 54  TODAY'S TREATMENT:                                                                                                                              DATE: 01/16/24 Unless otherwise stated, CGA was provided and gait belt donned in order to ensure pt safety    [facility] PT Assessment - 01/16/24 0001       Standardized Balance Assessment   Standardized Balance Assessment Berg Balance Test  Berg Balance Test   Sit to Stand Able to stand without using hands and stabilize independently    Standing Unsupported Able to stand 2 minutes with supervision    Sitting with Back Unsupported but Feet Supported on Floor or Stool Able to sit safely and securely 2 minutes    Stand to Sit Controls descent by using hands    Transfers Able to transfer safely, definite need of hands    Standing Unsupported with Eyes Closed Able to stand 3 seconds    Standing Unsupported with Feet Together Able to place feet together independently and stand for 1 minute with supervision    From Standing, Reach Forward with Outstretched Arm Reaches forward but needs supervision    From Standing Position, Pick up Object from Floor Able to pick up shoe, needs supervision    From Standing Position, Turn to Look Behind Over each Shoulder Turn sideways only but maintains balance    Turn 360 Degrees Able to turn 360 degrees safely but slowly    Standing Unsupported, Alternately Place Feet on Step/Stool Able to complete >2 steps/needs minimal assist    Standing Unsupported, One Foot in Front Able to take small step independently and hold 30 seconds    Standing on One Leg Tries to lift leg/unable to hold 3 seconds but remains standing independently    Total Score 34             6 Min Walk Test:  Instructed  patient to ambulate as quickly and as safely as possible for 6 minutes using LRAD. Patient was allowed to take standing rest breaks without stopping the test, but if the patient required a sitting rest break the clock would be stopped and the test would be over.  Results: 1159 feet using a SPC with CGA. Results indicate that the patient has reduced endurance with ambulation compared to age matched norms.  Age Matched Norms: 39-69 yo M: 34 F: 11, 59-79 yo M: 40 F: 471, 21-89 yo M: 417 F: 392 MDC: 58.21 meters (190.98 feet) or 50 meters (ANPTA Core Set of Outcome Measures for Adults with Neurologic Conditions, 2018)    PATIENT EDUCATION: Education details: goals, POC, educated on safety during updated exercises  Person educated: Patient and Spouse Education method: Explanation, Demonstration, Tactile cues, and Verbal cues Education comprehension: verbalized understanding, returned demonstration, verbal cues required, and tactile cues required  HOME EXERCISE PROGRAM: Access Code: [email or web address] URL: [email or web address] Date: 10/11/2022 Prepared by: Theresa Flank  Exercises - Seated Ankle Alphabet  - 1 x daily - 7 x weekly - 2 sets - 10 reps - 5 hold - Seated Heel Toe Raises  - 1 x daily - 7 x weekly - 2 sets - 10 reps - 5 hold - Standing March with Counter Support  - 1 x daily - 7 x weekly - 2 sets - 10 reps - 5 hold  Access Code: [email or web address] URL: [email or web address] Date: 10/30/2022 Prepared by: Farran Amsden  Exercises - Seated Ankle Plantarflexion with Resistance  - 1 x daily - 7 x weekly - 2 sets - 10 reps - 5 hold - Seated Ankle Eversion with Resistance  - 1 x daily - 7 x weekly - 2 sets - 10 reps - 5 hold  Access Code: [email or web address] URL: [email or web address] Date: 11/14/2022 Prepared by: Ibn Stief  Exercises - Seated Heel Toe Raises  - 1 x daily - 7 x weekly - 2 sets - 10 reps - 5 hold - Seated Hip Internal Rotation  AROM  - 1 x daily -  7 x weekly - 2 sets - 10 reps - 5 hold - Bilateral Long Arc Quad  - 1 x daily - 7 x weekly - 2 sets - 10 reps - 5 hold - Foam Balance Tandem stance  - 1 x daily - 7 x weekly - 2 sets - 2 reps - 30 hold - Single Leg Balance on Foam  - 1 x daily - 7 x weekly - 2 sets - 30 reps - 2 hold - Seated Hip Flexion March with Ankle Weights  - 1 x daily - 7 x weekly - 2 sets - 10 reps - 5 hold - Standing Hip Abduction with Ankle Weight  - 1 x daily - 7 x weekly - 2 sets - 10 reps - 5 hold - Standing Hip Extension with Ankle Weight  - 1 x daily - 7 x weekly - 2 sets - 10 reps - 5 hold - Standing Hip Flexion with Ankle Weight  - 1 x daily - 7 x weekly - 2 sets - 10 reps - 5 hold - Seated Long Arc Quad with Ankle Weight  - 1 x daily - 7 x weekly - 2 sets - 10 reps - 5 hold Access Code: [email or web address] URL: [email or web address] Date: 08/22/2023 Prepared by: Raekwon Winkowski  Exercises - Seated Finger Tip Pinch with Putty  - 1 x daily - 7 x weekly - 2 sets - 10 reps - 5 hold - Hand Squeezes  - 1 x daily - 7 x weekly - 2 sets - 10 reps - 5 hold - Key Pinch with Putty  - 1 x daily - 7 x weekly - 2 sets - 10 reps - 5 hold  GOALS: Goals reviewed with patient? Yes  SHORT TERM GOALS: Target date: 05/09/2023   Patient will be independent in home exercise program to improve strength/mobility for better functional independence with ADLs. Baseline: 2/21: HEP give next session 5/15 : intermittent compliance 8/22: intermittent compliance due to illness 11/14: compliant  Goal status: MET    LONG TERM GOALS: Target date: 01/16/2024    Patient will increase FOTO score to equal to or greater than  64%   to demonstrate statistically significant improvement in mobility and quality of life.  Baseline: 2/21: 54% 8/22: 52% 10/17: 57% 11/14: 53% 1/16: 47% Goal status: terminated  2.  Patient will increase Berg Balance score by > 6 points (42/56)to demonstrate decreased fall risk during functional  activities. Baseline: 2/21: 36/56 5/15: 45   Goal status: MET  3.  Patient will increase six minute walk test distance to >1500 for progression to age norm community ambulator and improve gait ability Baseline: 2/21: 1200 ft with [facility] 5/15: 1300 ft with [facility]  8/22: 905 ft with [facility] 10/17: 1100 ft with [facility] 11/14: 1110 ft with SPC 1/16: 1210 ft with SPC 3/6: 1100 ft with two canes 5/29: 1195 ft with SPC  Goal status: In progress  4.  Patient will increase BLE gross strength to 4+/5  or within 1 lb of each LE as to improve functional strength for independent gait, increased standing tolerance and increased ADL ability. Baseline: 2/21 :see above 5/15: see above 10/17: RLE 5/5 LLE 4/5 L ankle 3/5  11/14: RLE 5/5 LLE 4+/5 hamstring 4/5, ankle: 3+/5 1/16: RLE 5/5 LLE 4+/5 ankle 4-/5 3/6: see above 5/29: RLE 5/5 ankle 4-/5 LLE 4+/5 ankle 4-/5  Goal status:  In progress / modified  6.  Patient will increase Berg Balance score by > 6 points (51/56) to demonstrate decreased fall risk during functional activities. Baseline:  5/15: 45 8/22: 41/56 10/17: 28 11/14: 36 1/16: 36 3/6: 25 5/29: 34/56  Goal status: Ongoing  7.   Patient will increase UE strength to >4+/5 for ADLs, AD usage, and quality of life.  Baseline: 5/15: see above 8/22: see above 11/14: >4+/5 bilaterally  Goal status: MET  8.   Patient will increase lower extremity functional scale to >60/80 to demonstrate improved functional mobility and increased tolerance with ADLs.  Baseline: 11/14: 29%  1/16: 29%  3/6: 19%  5/29: 36%  Goal status: ongoing   ASSESSMENT:  CLINICAL IMPRESSION:  Patient is showing significant progress with balance as can be seen in BERG, he has continued need for stability training as he is still a fall risk. His LEFS has significantly improved as well as his 6 meter walk test. Due  to his progress and his compliance he will benefit from an additional recertification for therapy to continue progressing his independent  mobility.   Patient will benefit from skilled physical therapy to improve strength, mobility, and stability for improved quality of life.   OBJECTIVE IMPAIRMENTS: Abnormal gait, decreased activity tolerance, decreased balance, decreased coordination, decreased endurance, decreased mobility, difficulty walking, decreased ROM, decreased strength, hypomobility, impaired flexibility, improper body mechanics, and postural dysfunction.   ACTIVITY LIMITATIONS: carrying, lifting, bending, standing, squatting, stairs, transfers, reach over head, locomotion level, and caring for others  PARTICIPATION LIMITATIONS: meal prep, cleaning, laundry, personal finances, driving, shopping, community activity, and yard work  PERSONAL FACTORS: Age, Past/current experiences, Time since onset of injury/illness/exacerbation, and 3+ comorbidities: DHD, arthritis, concussion COVID, HTN, multiple lacunar infarcts, MVP, pernicious anemia, rocky mountain spotted fever, spinal stenosis, vestibular migraine, vitamin B deficiency.  are also affecting patient's functional outcome.   REHAB POTENTIAL: Good  CLINICAL DECISION MAKING: Evolving/moderate complexity  EVALUATION COMPLEXITY: Moderate  PLAN:   PT FREQUENCY: 1x/week  PT DURATION: 12 weeks  PLANNED INTERVENTIONS: Therapeutic exercises, Therapeutic activity, Neuromuscular re-education, Balance training, Gait training, Patient/Family education, Self Care, Joint mobilization, Stair training, Vestibular training, Canalith repositioning, Visual/preceptual remediation/compensation, Orthotic/Fit training, DME instructions, Cognitive remediation, Electrical stimulation, Spinal mobilization, Cryotherapy, Moist heat, Splintting, Taping, Traction, Ultrasound, Manual therapy, and Re-evaluation  PLAN FOR NEXT SESSION:  balance, strength, unstable surfaces    Keyston Ardolino  Brain Cahill, PT, DPT Physical Therapist - [facility]  Outpatient Physical Therapy-  Main Campus [phone]

## 2024-01-16 ENCOUNTER — Ambulatory Visit: Payer: 59

## 2024-01-16 ENCOUNTER — Ambulatory Visit: Admitting: Occupational Therapy

## 2024-01-16 ENCOUNTER — Other Ambulatory Visit: Payer: Self-pay | Admitting: Gastroenterology

## 2024-01-16 ENCOUNTER — Ambulatory Visit: Payer: Self-pay | Admitting: Internal Medicine

## 2024-01-16 DIAGNOSIS — R29898 Other symptoms and signs involving the musculoskeletal system: Secondary | ICD-10-CM | POA: Diagnosis not present

## 2024-01-16 DIAGNOSIS — R262 Difficulty in walking, not elsewhere classified: Secondary | ICD-10-CM

## 2024-01-16 DIAGNOSIS — G629 Polyneuropathy, unspecified: Secondary | ICD-10-CM | POA: Diagnosis not present

## 2024-01-16 DIAGNOSIS — R2681 Unsteadiness on feet: Secondary | ICD-10-CM

## 2024-01-16 DIAGNOSIS — M6281 Muscle weakness (generalized): Secondary | ICD-10-CM | POA: Diagnosis not present

## 2024-01-16 DIAGNOSIS — G4733 Obstructive sleep apnea (adult) (pediatric): Secondary | ICD-10-CM | POA: Diagnosis not present

## 2024-01-16 DIAGNOSIS — R278 Other lack of coordination: Secondary | ICD-10-CM

## 2024-01-16 DIAGNOSIS — F419 Anxiety disorder, unspecified: Secondary | ICD-10-CM | POA: Diagnosis not present

## 2024-01-16 DIAGNOSIS — R1032 Left lower quadrant pain: Secondary | ICD-10-CM

## 2024-01-16 DIAGNOSIS — R634 Abnormal weight loss: Secondary | ICD-10-CM

## 2024-01-16 DIAGNOSIS — Z1331 Encounter for screening for depression: Secondary | ICD-10-CM | POA: Diagnosis not present

## 2024-01-16 NOTE — Therapy (Signed)
 OUTPATIENT OCCUPATIONAL THERAPY NEURO TREATMENT NOTE  Patient Name: [name] MRN: [id number] DOB:December 17, 1960, 63 y.o., male Today's Date: 01/16/2024  PCP: Aileen Alexanders, NP REFERRING PROVIDER: Devora Folks, Linnell Richardson, MD  END OF SESSION:  OT End of Session - 01/16/24 1646     Visit Number 8    Number of Visits 24    Date for OT Re-Evaluation 01/30/24    OT Start Time 1315    OT Stop Time 1400    OT Time Calculation (min) 45 min    Activity Tolerance Patient tolerated treatment well    Behavior During Therapy [facility] for tasks assessed/performed              Past Medical History:  Diagnosis Date   ADHD    Arthritis    Concussion    COVID 12/2020   Hypertension    Multiple lacunar infarcts (HCC)    MVP (mitral valve prolapse)    Pernicious anemia    [place] spotted fever    Spinal stenosis    Stroke [facility])    Vestibular migraine    Vitamin B12 deficiency    Vitamin D  deficiency    Past Surgical History:  Procedure Laterality Date   ANTERIOR CERVICAL DECOMP/DISCECTOMY FUSION N/A 07/30/2022   Procedure: C4-7 ANTERIOR CERVICAL DISCECTOMY AND FUSION (GLOBUS HEDRON);  Surgeon: Jodeen Munch, MD;  Location: [facility] ORS;  Service: Neurosurgery;  Laterality: N/A;   BILATERAL CARPAL TUNNEL RELEASE     CERVICAL WOUND DEBRIDEMENT N/A 08/01/2022   Procedure: CERVICAL WOUND DEBRIDEMENT OF HEMATOMA;  Surgeon: Jodeen Munch, MD;  Location: [facility] ORS;  Service: Neurosurgery;  Laterality: N/A;   COLONOSCOPY     COLONOSCOPY WITH PROPOFOL  N/A 12/17/2023   Procedure: COLONOSCOPY WITH PROPOFOL ;  Surgeon: Shane Darling, MD;  Location: [facility] ENDOSCOPY;  Service: Endoscopy;  Laterality: N/A;   ESOPHAGOGASTRODUODENOSCOPY (EGD) WITH PROPOFOL  N/A 12/17/2023   Procedure: ESOPHAGOGASTRODUODENOSCOPY (EGD) WITH PROPOFOL ;  Surgeon: Shane Darling, MD;  Location: [facility] ENDOSCOPY;  Service: Endoscopy;  Laterality: N/A;   FRACTURE SURGERY Left 1984   intramedullary rod   hip  bone transplant  1984   LEG SURGERY Left    oseotomy   POLYPECTOMY  12/17/2023   Procedure: POLYPECTOMY, INTESTINE;  Surgeon: Shane Darling, MD;  Location: [facility] ENDOSCOPY;  Service: Endoscopy;;   REPAIR ANKLE LIGAMENT Right 1978   REPAIR KNEE LIGAMENT Bilateral    acl   TRIGGER FINGER RELEASE Bilateral    thumbs   Patient Active Problem List   Diagnosis Date Noted   Lacunar stroke (HCC) 12/12/2023   Essential tremor 12/12/2023   Dizziness 10/30/2023   Postoperative hematoma of musculoskeletal structure following musculoskeletal procedure 08/01/2022   Cervical myelopathy (HCC) 07/30/2022   Cervical spinal stenosis 07/30/2022   Radiculopathy, cervical 07/30/2022   S/P cervical spinal fusion 07/30/2022   Vitamin D  deficiency 06/25/2022   B12 deficiency 06/25/2022   Elevated LDL cholesterol level 06/25/2022   Prediabetes 03/27/2021   History of nausea- reported 01/27/2020 02/02/2020   Accidental fall from ladder 02/02/2020   Acute right-sided thoracic back pain 02/02/2020   Neck stiffness 02/02/2020   Concussion with no loss of consciousness 02/02/2020   Multiple falls 02/02/2020   Head trauma, initial encounter 02/02/2020   Acute post-traumatic headache, not intractable 02/02/2020   Essential hypertension 08/26/2018   Pre-bariatric surgery nutrition evaluation 06/19/2018   Migraine with vertigo 01/05/2014   Arthralgia of multiple joints 04/28/2009   Carpal tunnel syndrome 08/30/2008    ONSET DATE: 07/30/2022  REFERRING DIAG: ACDF, Hx of CVAs  THERAPY DIAG:  Muscle weakness (generalized)  Other lack of coordination  Rationale for Evaluation and Treatment: Rehabilitation  SUBJECTIVE:   SUBJECTIVE STATEMENT:  Pr. 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  Pt. Reports he is cutting bamboo to use as walking sticks.   next scheduled visit.  Pt accompanied by: self  PERTINENT HISTORY: Pt. underwent an ACDF C4-C7.  L5-S1 anterolithesis, and disc degeneration causing marked biforminal L5 impingement, Spinal stenosis from L1-2 to L3-4 with L2-3 being the most advanced. Bilateral hand tremors. Bilateral Lacunar infarcts in 2020. See above list for PMHx .  PRECAUTIONS: None  WEIGHT BEARING RESTRICTIONS: No  PAIN:  Are you having pain? None reported  FALLS: Has patient fallen in last 6 months? Yes. Number of falls 6-10 falls  LIVING ENVIRONMENT: Lives with: lives with their spouse Lives in: House/apartment 2 levels Stairs: 4-5 steps. Has following equipment at home: Single point cane, Walker - 2 wheeled, and shower chair  PLOF: Independent  PATIENT GOALS:  To improve bilateral hand cooridnation  OBJECTIVE:  Note: Objective measures were completed at Evaluation unless otherwise noted.  HAND DOMINANCE: Right hand dominant  ADLs:  Transfers/ambulation related to ADLs: Eating: feels like hand, and mouth are not working together, drinks drip out of mouth, Difficulty using fork, and knife together for self-feeding. Grooming: Independent brushing teeth UB Dressing: Difficulty buttoning LB Dressing: Difficulty managing pants, shoes, and socks Toileting: Independent Bathing: incoordination with applying shampoo with bilateral hands Tub Shower transfers: Distant supervision   IADLs: Shopping: Delivery Light housekeeping: Difficulty with vacuuming-bending over Meal Prep:  Independent Community mobility:  Independent Medication management: Wife sets up pillbox-Pt. Takes pills independently  Financial management: No changes Handwriting: 75% legible  MOBILITY STATUS: Hx of falls  POSTURE COMMENTS:   Sitting balance: good  ACTIVITY TOLERANCE: Activity tolerance: Fair   UPPER EXTREMITY ROM:    Active ROM Right Eval WFL Left Eval WFL  (Blank rows = not tested)  UPPER EXTREMITY MMT:     MMT Right Eval  Left Eval   Shoulder flexion 4+/5 4+/5  Shoulder abduction 4+/5 4+/5   Shoulder adduction    Shoulder extension    Shoulder internal rotation    Shoulder external rotation    Middle trapezius    Lower trapezius    Elbow flexion 5/5 5/5  Elbow extension 5/5 5/5  Wrist flexion    Wrist extension 5/5 5/5  Wrist ulnar deviation    Wrist radial deviation    Wrist pronation    Wrist supination    (Blank rows = not tested)  HAND FUNCTION: Grip strength: Right: 48 lbs; Left: 44 lbs, Lateral pinch: Right: 17 lbs, Left: 16 lbs, and 3 point pinch: Right: 12 lbs, Left: 19 lbs  COORDINATION: 9 Hole Peg test: Right: 25 sec; Left: 26 sec  SENSATION:  Pt. reports no changes  EDEMA: N/A  MUSCLE TONE: Intact;  Atrophy at the right dorsal interossei muscles.  COGNITION: Overall cognitive status: WFL for tasks performed  VISION:  Wears glasses. No change from baseline   PERCEPTION: intact   OBSERVATIONS: Pt. Left his 2 wheeled scooter in the waiting/reception area  TREATMENT DATE:  01/14/24  Therapeutic Ex.:   -UE strengthening through reciprocal motion using the SciFit for 8 min. On level 4.0 with constant monitoring of the BUEs. Pt. worked on changing, and alternating forward reverse position.  -Performed BUE strengthening using a 4# dowel ex. 2/2 to weakness. Bilateral overhead shoulder flexion, chest press, circular patterns, and elbow flexion/extension for 1 set 10-20 reps each.  -Performed BUE strengthening using 4# hand weight for elbow flexion, and extension, forearm supination/pronation    Therapeutic Activities:  EZ Board exercises were  performed to target forearm supination/pronation, wrist flexion/extension using gross grasp in multiple planes to promote shoulder flexion, abduction, and wrist flexion, and extension while performing resistive wrist flexion and extension with a gross grip to assist with using the  Bilateral UEs in sustained elevation during haircare.    PATIENT EDUCATION: Education details: OT services, POC, goals, ADL/IADL functional status. Person educated: Patient Education method: Medical illustrator Education comprehension: verbalized understanding and returned demonstration  HOME EXERCISE PROGRAM:   Hand strengthening with green theraputtty.   GOALS: Goals reviewed with patient? Yes  SHORT TERM GOALS: Target date: 12/19/2023  Pt. Will be independent with HEPs for BUEs  Baseline: Eval: No current HEP Goal status: INITIAL  LONG TERM GOALS: Target date: 01/30/2024  Pt. Will perform buttoning with modified independence Baseline: Eval: Buttoning is difficult Goal status: INITIAL  2.  Pt. Will be able to cut food with Modified independence Baseline: Eval: Pt. Has difficulty using bilateral hand ts cut food. Goal status: INITIAL  3.  Pt. Will improve bilateral UE strength by 2 mm grades to be able to sustain bilateral UEs in elevation while coordinating bilateral hands for shampooing. Baseline: Eval: Pt. Has difficulty coordinating UEs during shampooing. Goal status: INITIAL  4.  Pt. Will demonstrate compensatory/adaptive equipment strategies for LE dressing tasks.  Baseline: Eval:Pt. Has difficulty performing LE dressing   Goal status: INITIAL  5.  Pt. Will identify 2 compensatory strategies for bilateral tremors during ADLs/IADLs Baseline: Eval: Education to be provided Goal status: INITIAL  ASSESSMENT:  CLINICAL IMPRESSION:  Pt. reports having difficulty buttoning the cuff of his shirt, and having difficulty with sustained UE elevation while lathering, and washing his hair. Pt. required verbal cues, cues for visual demonstration, a cues for pace, and cues for redirection at times during each of the exercises. Pt. is improving with BUE functional reaching, and continues to work towards improving sustained BUE functional use during ADL tasks. Pt.  continues to benefit from OT services to work on improving BUE functioning, and maximize independence with ADLs, and IADL tasks.   PERFORMANCE DEFICITS: in functional skills including ADLs, IADLs, coordination, dexterity, ROM, strength, pain, Fine motor control, Gross motor control, and UE functional use, cognitive skills including , and psychosocial skills including coping strategies, environmental adaptation, and routines and behaviors.   IMPAIRMENTS: are limiting patient from ADLs, IADLs, and leisure.   CO-MORBIDITIES: may have co-morbidities  that affects occupational performance. Patient will benefit from skilled OT to address above impairments and improve overall function.  MODIFICATION OR ASSISTANCE TO COMPLETE EVALUATION: Min-Moderate modification of tasks or assist with assess necessary to complete an evaluation.  OT OCCUPATIONAL PROFILE AND HISTORY: Detailed assessment: Review of records and additional review of physical, cognitive, psychosocial history related to current functional performance.  CLINICAL DECISION MAKING: Moderate - several treatment options, min-mod task modification necessary  REHAB POTENTIAL: Good  EVALUATION COMPLEXITY: Moderate    PLAN:  OT FREQUENCY: 2x/month  OT DURATION: 12  weeks  PLANNED INTERVENTIONS: 97535 self care/ADL training, 60454 therapeutic exercise, 97530 therapeutic activity, 97112 neuromuscular re-education, 97140 manual therapy, 97018 paraffin, 09811 moist heat, 97034 contrast bath, energy conservation, patient/family education, and DME and/or AE instructions  RECOMMENDED OTHER SERVICES: PT  CONSULTED AND AGREED WITH PLAN OF CARE: Patient  PLAN FOR NEXT SESSION: Treatment  Duey Ghent, MS, OTR/L

## 2024-01-17 ENCOUNTER — Ambulatory Visit
Admission: RE | Admit: 2024-01-17 | Discharge: 2024-01-17 | Disposition: A | Discharge status: Home / Self Care | Source: Ambulatory Visit | Attending: Gastroenterology | Admitting: Gastroenterology

## 2024-01-17 DIAGNOSIS — N400 Enlarged prostate without lower urinary tract symptoms: Secondary | ICD-10-CM | POA: Diagnosis not present

## 2024-01-17 DIAGNOSIS — R1032 Left lower quadrant pain: Secondary | ICD-10-CM | POA: Insufficient documentation

## 2024-01-17 DIAGNOSIS — R634 Abnormal weight loss: Secondary | ICD-10-CM | POA: Insufficient documentation

## 2024-01-17 DIAGNOSIS — K7689 Other specified diseases of liver: Secondary | ICD-10-CM | POA: Diagnosis not present

## 2024-01-17 MED ORDER — IOHEXOL 300 MG/ML  SOLN
100.0000 mL | Freq: Once | INTRAMUSCULAR | Status: AC | PRN
Start: 2024-01-17 — End: 2024-01-17
  Administered 2024-01-17: 100 mL via INTRAVENOUS

## 2024-01-18 NOTE — Progress Notes (Unsigned)
 Cardiology Clinic Note   Date: 01/20/2024 ID: [name], DOB [birth year], MRN [id number]  Primary Cardiologist:  Sammy Crisp, MD  Chief Complaint   [name] is a 63 y.o. male who presents to the clinic today for follow up after testing and medication changes.  Patient Profile   [name] is followed by Dr. Nolan Battle for the history outlined below.      Past medical history significant for: Hypertension. Renal artery ultrasound 01/08/2024: Normal size kidneys bilaterally.  Normal resistive index, cortical thickness bilaterally.  No evidence of renal artery stenosis bilaterally.  Cyst noted in the right mid low pole 1 x 1.4 x 1 cm.  Normal celiac and superior mesenteric artery.  IVC patent. Echo 01/15/2024: EF 60 to 65%.  No RWMA.  Grade I DD.  Normal RV size/function.  Mitral valve is normal with no evidence of MR. Lacunar infarcts. MRI brain 10/29/2023: No acute intracranial abnormality.  Findings of chronic small vessel ischemia. Carotid duplex 01/15/2024: Extracranial vessels were near normal with only minimal wall thickening or plaque bilaterally.  Antegrade flow of bilateral vertebral arteries and normal bilateral subclavian arteries. [facility] spotted fever. Spinal stenosis.  In summary, patient was first evaluated by Dr. Nolan Battle on 12/11/2023 for hypertension and elevated renin levels.  He was seen by nephrology and switch from lisinopril  to amlodipine  with normalization of renin levels.  Patient and wife reported BP control not as good on amlodipine .  He had recently been started on propranolol  by neurologist for essential tremor.  Since starting on propranolol  he noticed changes in his skin, particularly the scalp as well as an unpleasant body odor.  Patient had been seen in the ED in March 2025 for severely elevated BP.  He had developed a vestibular migraine manifesting as sudden dizziness and checked his blood pressure at home noting it to be ~200/100.  Brain  MRI showed findings of chronic small vessel ischemia but no acute intracranial abnormality.  Plan for renal ultrasound, echo, carotid Dopplers for further evaluation.  He was instructed to hold propranolol  to see if side effects resolve and speak with neurology concerning treatment for his tremor.     History of Present Illness    Today, patient is accompanied by his wife. His BP continues to be elevated. He states better control during the day with BP as high as 170/108 in the evenings. He is very concerned about his BP, as his father died of a "massive stroke." Patient follows a heart healthy diet with limited meat and focus on healthy fats. He is not very active secondary to balance issues. He does have a seated peddler that he tries to use. They are concerned about him going on a statin because of side effects.     ROS: All other systems reviewed and are otherwise negative except as noted in History of Present Illness.  EKGs/Labs Reviewed       EKG is not ordered today.   10/30/2023: ALT 10; AST 19 01/08/2024: BUN 12; Creatinine, Ser 0.84; Potassium 4.4; Sodium 140   10/29/2023: Hemoglobin 16.3; WBC 7.1   07/11/2023: TSH 0.933    Risk Assessment/Calculations      HYPERTENSION CONTROL Vitals:   01/20/24 1058 01/20/24 1231  BP: (!) 158/86 (!) 140/86    The patient's blood pressure is elevated above target today.  In order to address the patient's elevated BP: A new medication was prescribed today.  Physical Exam    VS:  BP (!) 140/86 (BP Location: Left Arm, Patient Position: Sitting, Cuff Size: Normal)   Pulse 73   Ht 5\' 7"  (1.702 m)   Wt 139 lb 12.8 oz (63.4 kg)   SpO2 98%   BMI 21.90 kg/m  , BMI Body mass index is 21.9 kg/m.  GEN: Well nourished, well developed, in no acute distress. Neck: No JVD or carotid bruits. Cardiac:  RRR. No murmurs. No rubs or gallops.   Respiratory:  Respirations regular and unlabored. Clear to auscultation without rales,  wheezing or rhonchi. GI: Soft, nontender, nondistended. Extremities: Radials/DP/PT 2+ and equal bilaterally. No clubbing or cyanosis. No edema.  Skin: Warm and dry, no rash. Neuro: Strength intact.  Assessment & Plan   Hypertension Renal artery ultrasound May 2025 demonstrated normal-sized kidneys bilaterally with no evidence of renal artery stenosis.  BP today 158/86 on intake and 140/86 on my recheck. Home BP has been elevated particularly in the evening. BP last night was 170/108. He was previously taken off lisinopril  secondary to cough and increased renin levels. His renin normalized and kidney function has been normal.  - Continue amlodipine  and hydrochlorothiazide . - Start olmesartan 20 mg daily. - BMP in 2 weeks.   Lacunar infarcts MRI March 2025 with findings of chronic small vessel ischemia.  Carotid duplex May 2025 showed extracranial vessels were near normal with only minimal wall thickening or plaque bilaterally.  Patient is concerned about starting a statin secondary to side effects. He would like to focus on diet and exercise. Discussed the possibility of Zetia in the future.  - Continue aspirin. - Continue to follow a Mediterranean diet and increase physical activity as tolerated.   Disposition: Olmesartan 20 mg daily. BMP in 2 weeks. Return in 3 months.          Signed, Lonell Rives. Trevor Wilkie, DNP, NP-C

## 2024-01-20 ENCOUNTER — Ambulatory Visit (INDEPENDENT_AMBULATORY_CARE_PROVIDER_SITE_OTHER): Admitting: Nurse Practitioner

## 2024-01-20 ENCOUNTER — Encounter: Payer: Self-pay | Admitting: Student

## 2024-01-20 ENCOUNTER — Encounter: Payer: Self-pay | Admitting: Nurse Practitioner

## 2024-01-20 ENCOUNTER — Ambulatory Visit: Attending: Student | Admitting: Student

## 2024-01-20 VITALS — BP 140/86 | HR 73 | Ht 67.0 in | Wt 139.8 lb

## 2024-01-20 VITALS — BP 150/74 | Ht 67.0 in | Wt 141.0 lb

## 2024-01-20 DIAGNOSIS — I10 Essential (primary) hypertension: Secondary | ICD-10-CM | POA: Diagnosis not present

## 2024-01-20 DIAGNOSIS — I6381 Other cerebral infarction due to occlusion or stenosis of small artery: Secondary | ICD-10-CM | POA: Diagnosis not present

## 2024-01-20 DIAGNOSIS — R7303 Prediabetes: Secondary | ICD-10-CM | POA: Diagnosis not present

## 2024-01-20 DIAGNOSIS — Z79899 Other long term (current) drug therapy: Secondary | ICD-10-CM | POA: Diagnosis not present

## 2024-01-20 DIAGNOSIS — E538 Deficiency of other specified B group vitamins: Secondary | ICD-10-CM

## 2024-01-20 DIAGNOSIS — E7800 Pure hypercholesterolemia, unspecified: Secondary | ICD-10-CM

## 2024-01-20 DIAGNOSIS — R29898 Other symptoms and signs involving the musculoskeletal system: Secondary | ICD-10-CM | POA: Diagnosis not present

## 2024-01-20 DIAGNOSIS — E559 Vitamin D deficiency, unspecified: Secondary | ICD-10-CM

## 2024-01-20 MED ORDER — OLMESARTAN MEDOXOMIL 20 MG PO TABS: 20.0000 mg | ORAL_TABLET | Freq: Every day | ORAL | 3 refills | Status: DC

## 2024-01-20 MED ORDER — CYANOCOBALAMIN 1000 MCG/ML IJ SOLN: 1000.0000 ug | Freq: Once | INTRAMUSCULAR | Status: DC

## 2024-01-20 MED ORDER — CYANOCOBALAMIN 1000 MCG/ML IJ SOLN
1000.0000 ug | Freq: Once | INTRAMUSCULAR | Status: AC
  Administered 2024-01-20: 1000 ug via INTRAMUSCULAR

## 2024-01-20 NOTE — Assessment & Plan Note (Signed)
 Labs ordered at visit today.  Will make recommendations based on lab results.

## 2024-01-20 NOTE — Patient Instructions (Signed)
 Medication Instructions:  Your physician recommends the following medication changes.  START TAKING: Olmesartan 20 mg once daily  *If you need a refill on your cardiac medications before your next appointment, please call your pharmacy*  Lab Work: Your provider would like for you to return in 2 weeks to have the following labs drawn: BMet.   Please go to [facility] [address] ([facility] [address], [place] [address] You do not need an appointment.  They are open from 8 am- 4:30 pm.  Lunch from 1:00 pm- 2:00 pm You DO NOT need to be fasting.   You may also go to one of the following [facility]:  [address] [place], [place] [address] Phone: [phone] Lab hours: Mon-Fri 8 am- 5 pm    Lunch 12 pm- 1 pm  [address] [place],  [place]  [address]  [place]  Phone: [phone] Lab hours: 7 am- 4 pm Lunch 12 pm-1 pm   [address] [place],  [place]  [address]  [place]  Phone: [phone] Lab hours: Mon-Fri 8 am- 5 pm    Lunch 12 pm- 1 pm  If you have labs (blood work) drawn today and your tests are completely normal, you will receive your results only by: MyChart Message (if you have MyChart) OR A paper copy in the mail If you have any lab test that is abnormal or we need to change your treatment, we will call you to review the results.  Testing/Procedures: None ordered at this time   Follow-Up: At [facility], you and your health needs are our priority.  As part of our continuing mission to provide you with exceptional heart care, our providers are all part of one team.  This team includes your primary Cardiologist (physician) and Advanced Practice Providers or APPs (Physician Assistants and Nurse Practitioners) who all work together to provide you with the care you need, when you need it.  Your next appointment:   3 month(s)  Provider:   You may see Sammy Crisp, MD or one of the following Advanced Practice Providers on your designated Care  Team:   Laneta Pintos, NP Gildardo Labrador, PA-C Varney Gentleman, PA-C Cadence Chocowinity, PA-C Ronald Cockayne, NP Morey Ar, NP    We recommend signing up for the patient portal called "MyChart".  Sign up information is provided on this After Visit Summary.  MyChart is used to connect with patients for Virtual Visits (Telemedicine).  Patients are able to view lab/test results, encounter notes, upcoming appointments, etc.  Non-urgent messages can be sent to your provider as well.   To learn more about what you can do with MyChart, go to [email or web address].

## 2024-01-20 NOTE — Assessment & Plan Note (Signed)
 Chronic. Not well controlled.  Cardiology added Olmesartan.  Continue with amlodipine  and HCTZ. Labs ordered.  Follow up in 3 months.  Call sooner if concerns arise.

## 2024-01-20 NOTE — Assessment & Plan Note (Signed)
 Labs ordered at visit today.  Will make recommendations based on lab results. Was recommended to start Statin but hesitant at this time.

## 2024-01-20 NOTE — Progress Notes (Signed)
 BP (!) 150/74   Ht 5\' 7"  (1.702 m)   Wt 141 lb (64 kg)   BMI 22.08 kg/m    Subjective:    Patient ID: [name], male    DOB: 08/28/1960, 63 y.o.   MRN: [id number]  HPI: [name] is a 63 y.o. male  Chief Complaint  Patient presents with   Medical Management of Chronic Issues   HYPERTENSION with Chronic Kidney Disease He is currently taking Amlodipine , HCTZ.  Started on Olmesartan this morning with Cardiology.  Hypertension status: controlled Satisfied with current treatment? no Duration of hypertension: years BP monitoring frequency:  not checking BP range:  BP medication side effects:  no Medication compliance: excellent compliance Previous BP meds:none Aspirin: no Recurrent headaches: no Visual changes: no Palpitations: no Dyspnea: no Chest pain: no Lower extremity edema: no Dizzy/lightheaded: no   Patient has not gained any weight.  He does not have an appetite and doesn't eat a lot at home.  He is drinking protein drinks   ANXIETY Patient has had anxiety for most of his life.  Does not want to be on maintenance medication.  Only uses the lorazepam  PRN.  Does agree that something for his physical symptoms of shaking would be beneficial.   Relevant past medical, surgical, family and social history reviewed and updated as indicated. Interim medical history since our last visit reviewed. Allergies and medications reviewed and updated.  Review of Systems  Constitutional:  Negative for unexpected weight change.  Eyes:  Negative for visual disturbance.  Respiratory:  Negative for shortness of breath.   Cardiovascular:  Negative for chest pain and leg swelling.  Neurological:  Negative for light-headedness and headaches.  Psychiatric/Behavioral:  The patient is nervous/anxious.     Per HPI unless specifically indicated above     Objective:    BP (!) 150/74   Ht 5\' 7"  (1.702 m)   Wt 141 lb (64 kg)   BMI 22.08 kg/m   Wt Readings from Last  3 Encounters:  01/20/24 141 lb (64 kg)  01/20/24 139 lb 12.8 oz (63.4 kg)  12/17/23 137 lb 6.4 oz (62.3 kg)    Physical Exam Vitals and nursing note reviewed.  Constitutional:      General: He is not in acute distress.    Appearance: Normal appearance. He is not ill-appearing, toxic-appearing or diaphoretic.  HENT:     Head: Normocephalic.     Right Ear: External ear normal.     Left Ear: External ear normal.     Nose: Nose normal. No congestion or rhinorrhea.     Mouth/Throat:     Mouth: Mucous membranes are moist.  Eyes:     General:        Right eye: No discharge.        Left eye: No discharge.     Extraocular Movements: Extraocular movements intact.     Conjunctiva/sclera: Conjunctivae normal.     Pupils: Pupils are equal, round, and reactive to light.  Cardiovascular:     Rate and Rhythm: Normal rate and regular rhythm.     Heart sounds: No murmur heard. Pulmonary:     Effort: Pulmonary effort is normal. No respiratory distress.     Breath sounds: Normal breath sounds. No wheezing, rhonchi or rales.  Abdominal:     General: Abdomen is flat. Bowel sounds are normal.  Musculoskeletal:     Cervical back: Normal range of motion and neck supple.  Skin:  General: Skin is warm and dry.     Capillary Refill: Capillary refill takes less than 2 seconds.  Neurological:     General: No focal deficit present.     Mental Status: He is alert and oriented to person, place, and time.     Comments: tremor  Psychiatric:        Mood and Affect: Mood normal.        Behavior: Behavior normal.        Thought Content: Thought content normal.        Judgment: Judgment normal.     Results for orders placed or performed in visit on 01/15/24  ECHOCARDIOGRAM COMPLETE   Collection Time: 01/15/24 12:45 PM  Result Value Ref Range   AR max vel 2.64 cm2   AV Peak grad 6.6 mmHg   Ao pk vel 1.28 m/s   S' Lateral 2.50 cm   Area-P 1/2 5.13 cm2   AV Area VTI 2.49 cm2   AV Mean grad 3.0  mmHg   Single Plane A4C EF 68.1 %   Single Plane A2C EF 62.2 %   Calc EF 64.5 %   AV Area mean vel 2.46 cm2   Est EF 60 - 65%       Assessment & Plan:   Problem List Items Addressed This Visit       Cardiovascular and Mediastinum   Essential hypertension - Primary   Chronic.  Controlled.  Continue with current medication regimen of Amlodipine .  Labs ordered today.  Return to clinic in 3 months for reevaluation.  Call sooner if concerns arise.        Relevant Medications   propranolol  (INDERAL ) 10 MG tablet   Other Visit Diagnoses       Anxiety       Will start Propranolol . Discussed with patient about low HR. Typical HR is 60-80s for patient. Encouraged him to increase calorie intake. Has an appointment upcoming with Cardiology. Can consider decreasing dose of Amlodipine  if BP is soft.  Follow up in 2 months.         Follow up plan: No follow-ups on file.

## 2024-01-21 ENCOUNTER — Ambulatory Visit: Payer: Self-pay | Admitting: Nurse Practitioner

## 2024-01-21 LAB — CBC WITH DIFFERENTIAL/PLATELET
Basophils Absolute: 0 10*3/uL (ref 0.0–0.2)
Basos: 1 %
EOS (ABSOLUTE): 0 10*3/uL (ref 0.0–0.4)
Eos: 0 %
Hematocrit: 45.4 % (ref 37.5–51.0)
Hemoglobin: 15.5 g/dL (ref 13.0–17.7)
Immature Grans (Abs): 0 10*3/uL (ref 0.0–0.1)
Immature Granulocytes: 0 %
Lymphocytes Absolute: 1.9 10*3/uL (ref 0.7–3.1)
Lymphs: 28 %
MCH: 31.5 pg (ref 26.6–33.0)
MCHC: 34.1 g/dL (ref 31.5–35.7)
MCV: 92 fL (ref 79–97)
Monocytes Absolute: 0.4 10*3/uL (ref 0.1–0.9)
Monocytes: 5 %
Neutrophils Absolute: 4.3 10*3/uL (ref 1.4–7.0)
Neutrophils: 66 %
Platelets: 223 10*3/uL (ref 150–450)
RBC: 4.92 x10E6/uL (ref 4.14–5.80)
RDW: 13 % (ref 11.6–15.4)
WBC: 6.6 10*3/uL (ref 3.4–10.8)

## 2024-01-21 LAB — COMPREHENSIVE METABOLIC PANEL WITH GFR
ALT: 13 IU/L (ref 0–44)
AST: 18 IU/L (ref 0–40)
Albumin: 4.6 g/dL (ref 3.9–4.9)
Alkaline Phosphatase: 82 IU/L (ref 44–121)
BUN/Creatinine Ratio: 15 (ref 10–24)
BUN: 13 mg/dL (ref 8–27)
Bilirubin Total: 0.8 mg/dL (ref 0.0–1.2)
CO2: 23 mmol/L (ref 20–29)
Calcium: 9.1 mg/dL (ref 8.6–10.2)
Chloride: 101 mmol/L (ref 96–106)
Creatinine, Ser: 0.89 mg/dL (ref 0.76–1.27)
Globulin, Total: 2 g/dL (ref 1.5–4.5)
Glucose: 114 mg/dL — ABNORMAL HIGH (ref 70–99)
Potassium: 4.4 mmol/L (ref 3.5–5.2)
Sodium: 140 mmol/L (ref 134–144)
Total Protein: 6.6 g/dL (ref 6.0–8.5)
eGFR: 96 mL/min/{1.73_m2} (ref >59)

## 2024-01-21 LAB — LIPID PANEL
Chol/HDL Ratio: 2.6 ratio (ref 0.0–5.0)
Cholesterol, Total: 201 mg/dL — ABNORMAL HIGH (ref 100–199)
HDL: 77 mg/dL (ref >39)
LDL Chol Calc (NIH): 100 mg/dL — ABNORMAL HIGH (ref 0–99)
Triglycerides: 139 mg/dL (ref 0–149)
VLDL Cholesterol Cal: 24 mg/dL (ref 5–40)

## 2024-01-21 LAB — VITAMIN B12: Vitamin B-12: 1058 pg/mL (ref 232–1245)

## 2024-01-21 LAB — HEMOGLOBIN A1C
Est. average glucose Bld gHb Est-mCnc: 120 mg/dL
Hgb A1c MFr Bld: 5.8 % — ABNORMAL HIGH (ref 4.8–5.6)

## 2024-01-23 ENCOUNTER — Ambulatory Visit: Payer: 59

## 2024-01-27 NOTE — Therapy (Signed)
 OUTPATIENT PHYSICAL THERAPY NEURO TREATMENT     Patient Name: [name] MRN: [id number] DOB:1961-07-22, 63 y.o., male Today's Date: 01/27/2024   PCP: Aileen Alexanders NP REFERRING PROVIDER: Noble Bateman PA  END OF SESSION:                                      Past Medical History:  Diagnosis Date   ADHD    Arthritis    Concussion    COVID 12/2020   Hypertension    Multiple lacunar infarcts (HCC)    MVP (mitral valve prolapse)    Pernicious anemia    [facility] spotted fever    Spinal stenosis    Stroke [facility])    Vestibular migraine    Vitamin B12 deficiency    Vitamin D  deficiency    Past Surgical History:  Procedure Laterality Date   ANTERIOR CERVICAL DECOMP/DISCECTOMY FUSION N/A 07/30/2022   Procedure: C4-7 ANTERIOR CERVICAL DISCECTOMY AND FUSION (GLOBUS HEDRON);  Surgeon: Jodeen Munch, MD;  Location: [facility] ORS;  Service: Neurosurgery;  Laterality: N/A;   BILATERAL CARPAL TUNNEL RELEASE     CERVICAL WOUND DEBRIDEMENT N/A 08/01/2022   Procedure: CERVICAL WOUND DEBRIDEMENT OF HEMATOMA;  Surgeon: Jodeen Munch, MD;  Location: [facility] ORS;  Service: Neurosurgery;  Laterality: N/A;   COLONOSCOPY     COLONOSCOPY WITH PROPOFOL  N/A 12/17/2023   Procedure: COLONOSCOPY WITH PROPOFOL ;  Surgeon: Shane Darling, MD;  Location: [facility] ENDOSCOPY;  Service: Endoscopy;  Laterality: N/A;   ESOPHAGOGASTRODUODENOSCOPY (EGD) WITH PROPOFOL  N/A 12/17/2023   Procedure: ESOPHAGOGASTRODUODENOSCOPY (EGD) WITH PROPOFOL ;  Surgeon: Shane Darling, MD;  Location: [facility] ENDOSCOPY;  Service: Endoscopy;  Laterality: N/A;   FRACTURE SURGERY Left 1984   intramedullary rod   hip bone transplant  1984   LEG SURGERY Left    oseotomy   POLYPECTOMY  12/17/2023   Procedure: POLYPECTOMY, INTESTINE;  Surgeon: Shane Darling, MD;  Location: [facility] ENDOSCOPY;  Service: Endoscopy;;   REPAIR ANKLE LIGAMENT Right 1978   REPAIR KNEE LIGAMENT  Bilateral    acl   TRIGGER FINGER RELEASE Bilateral    thumbs   Patient Active Problem List   Diagnosis Date Noted   Lacunar stroke (HCC) 12/12/2023   Essential tremor 12/12/2023   Dizziness 10/30/2023   Postoperative hematoma of musculoskeletal structure following musculoskeletal procedure 08/01/2022   Cervical myelopathy (HCC) 07/30/2022   Cervical spinal stenosis 07/30/2022   Radiculopathy, cervical 07/30/2022   S/P cervical spinal fusion 07/30/2022   Vitamin D  deficiency 06/25/2022   B12 deficiency 06/25/2022   Elevated LDL cholesterol level 06/25/2022   Prediabetes 03/27/2021   History of nausea- reported 01/27/2020 02/02/2020   Accidental fall from ladder 02/02/2020   Acute right-sided thoracic back pain 02/02/2020   Neck stiffness 02/02/2020   Concussion with no loss of consciousness 02/02/2020   Multiple falls 02/02/2020   Head trauma, initial encounter 02/02/2020   Acute post-traumatic headache, not intractable 02/02/2020   Essential hypertension 08/26/2018   Pre-bariatric surgery nutrition evaluation 06/19/2018   Migraine with vertigo 01/05/2014   Arthralgia of multiple joints 04/28/2009   Carpal tunnel syndrome 08/30/2008    ONSET DATE: summer 2022  REFERRING DIAG: LE weakness and gait instability   THERAPY DIAG:  No diagnosis found.  Rationale for Evaluation and Treatment: Rehabilitation  SUBJECTIVE:  SUBJECTIVE STATEMENT: Patient had new study: Conclusion: This is an abnormal study.  1. There is electrodiagnostic evidence of chronic, severe right L5-S1 radiculopathy with ongoing active denervation. A left L5 radiculopathy is also likely but was not fully assessed on today's testing. Consider lumbar spine imaging if not already performed. 2. There is electrodiagnostic and  sonographic evidence of a chronic, severe right ulnar neuropathy at the elbow with ongoing active denervation.  3. There is no electrodiagnostic evidence for large fiber polyneuropathy.   Pt accompanied by: self  PERTINENT HISTORY:  Patient presents for LE weakness and gait instability. He is s/p ACDF C-7 on 07/30/22. Patient has a leg length discrepancy and limited L knee flexion since his 20's. Patient has a lot of atrophy and lower back pain, foot drop bilaterally.  First symptoms was stumbling up stairs, with LLE. Foot drop was improved after ACDF. Now has numbness and tingling at the end of the day. Does have a few lesions in his pons per wife, on blood pressure medication now. PMH includes ADHD, arthritis, concussion COVID, HTN, multiple lacunar infarcts, MVP, pernicious anemia, rocky mountain spotted fever, spinal stenosis, vestibular migraine, vitamin B deficiency.   Patient had new study: Conclusion: This is an abnormal study.  1. There is electrodiagnostic evidence of chronic, severe right L5-S1 radiculopathy with ongoing active denervation. A left L5 radiculopathy is also likely but was not fully assessed on today's testing. Consider lumbar spine imaging if not already performed. 2. There is electrodiagnostic and sonographic evidence of a chronic, severe right ulnar neuropathy at the elbow with ongoing active denervation.  3. There is no electrodiagnostic evidence for large fiber polyneuropathy.   PAIN:  Are you having pain? Denies pain   PRECAUTIONS: Other: cervical; no lifting more than 20 lb   WEIGHT BEARING RESTRICTIONS: No  FALLS: Has patient fallen in last 6 months? Yes. Number of falls multiple a week  LIVING ENVIRONMENT: Lives with: lives with their family Lives in: House/apartment Stairs: Yes: Internal: flight steps; is sleeping downstairs Has following equipment at home: Single point cane  PLOF: Independent, uses a walking stick  PATIENT GOALS: gait control of  feet, strengthen legs, balance, atrophy in hand  OBJECTIVE:   DIAGNOSTIC FINDINGS:  09/11/22: No acute fracture. No spondylolisthesis. No compression deformities. Disc space narrowing C3-4 with marginal osteophytes consistent with degenerative disc disease. Postop changes ACDF C4-C7 with anatomic alignment. Normal prevertebral and cervicocranial soft tissues.   06/19/22: 1. L5 chronic pars defects with L5-S1 anterolisthesis and advanced disc degeneration causing marked biforaminal L5 impingement. 2. Degeneration and short pedicles causes compressive spinal stenosis from L1-2 to L3-4, most advanced at L2-3. Moderate bilateral foraminal narrowing at the same levels.   COGNITION: Overall cognitive status: Within functional limits for tasks assessed    COORDINATION: Limited LLE  MUSCLE LENGTH: Significant limitation L hamstring; 60 degree flexion deficit.   POSTURE: weight shift to the R  LOWER EXTREMITY ROM:     L knee flexion contraction -60 contraction LOWER EXTREMITY MMT:    MMT Right Eval R 5/15 R 8/22 Left Eval L 5/15 L 8/22  Hip flexion 4 5 5 4  4+ 4  Hip extension        Hip abduction 4- 4 4 4- 4 4  Hip adduction 4- 4 4 4- 4- 4  Hip internal rotation        Hip external rotation        Knee flexion 4- 4 4 3  4- 4-  Knee extension  4 4+ 5 4 4  4-  Ankle dorsiflexion 3 3 3+ 2+ 2+ 2+  Ankle plantarflexion 3 3+ 4- 3 3+ 3+  Ankle inversion        Ankle eversion        (Blank rows = not tested)  01/02/23 Right 8/22: RECERT  10/17 PN Left 8/22: Recert LUE 10/17: PN   Shoulder Flexion 4 4+ 5 4- 4+ 4+  Shoulder Abduction 4- 4 4+ 4- 4 4+  Shoulder Extension 4- 4+ 4 4- 4   Shoulder adduction 4- 4  3+ 4 4+  Bicep  4 4+ 5 4 4+ 4  Triceps 4- 4- 5 3+ 4 4     Right Left  Hip flexion 12.2 10.2  Hip Abduction 7.4 7.0  Hip Adduction 10.8 10.5  Knee Extension  13.8 13.4  Knee Flexion 13.4 13.2  DF    PF       BED MOBILITY:  Perform next  session  TRANSFERS: Assistive device utilized: Single point cane  Sit to stand: CGA Stand to sit: CGA Chair to chair: CGA   GAIT: Gait pattern: L knee limited extension, L foot drop, L foot eversion Distance walked: 1200 Assistive device utilized: Single point cane Level of assistance: CGA Comments: Patient has increased eversion and rolling of L foot, LLE leg length discrepancy.   FUNCTIONAL TESTS:  5 times sit to stand: 5.6 seconds with minor use of LLE no UE support Timed up and go (TUG): n/a 6 minute walk test: 1200 ft 10 meter walk test: 7 seconds BERG: 36/56   PATIENT SURVEYS:  FOTO 54  TODAY'S TREATMENT:                                                                                                                              DATE: 01/27/24 Unless otherwise stated, CGA was provided and gait belt donned in order to ensure pt safety   TherAct: In // bars: Walk forward/backwards without UE support 8x length Lateral step 8x length of bars; cue for visual feedback    5lb ankle weights; -walking marches 4x length  -lateral stepping 4x length    TherEx:   Adductor squeeze 15x  Adduction squeeze with ER/IR 10x each LE  Adduction with heel raise 15   #5 AW Seated LAQ 10x each LE  Heel raise 15x   Neuro Re-ed:  balance beam: -lateral step with UE support 6x with cue for visual field -tandem walk with UE support 8x -static stand 60 seconds  -pvc pipe chest press 10x, overhead press 10x    Single leg stance LLE no shoe on 30 seconds x 3 trials    PATIENT EDUCATION: Education details: goals, POC, educated on safety during updated exercises  Person educated: Patient and Spouse Education method: Explanation, Demonstration, Tactile cues, and Verbal cues Education comprehension: verbalized understanding, returned demonstration, verbal cues required, and tactile cues required  HOME EXERCISE PROGRAM: Access Code: [email or web address] URL:  [email or web address] Date: 10/11/2022 Prepared by: Lise Pincus  Exercises - Seated Ankle Alphabet  - 1 x daily - 7 x weekly - 2 sets - 10 reps - 5 hold - Seated Heel Toe Raises  - 1 x daily - 7 x weekly - 2 sets - 10 reps - 5 hold - Standing March with Counter Support  - 1 x daily - 7 x weekly - 2 sets - 10 reps - 5 hold  Access Code: [email or web address] URL: [email or web address] Date: 10/30/2022 Prepared by: Jewelia Bocchino  Exercises - Seated Ankle Plantarflexion with Resistance  - 1 x daily - 7 x weekly - 2 sets - 10 reps - 5 hold - Seated Ankle Eversion with Resistance  - 1 x daily - 7 x weekly - 2 sets - 10 reps - 5 hold  Access Code: [email or web address] URL: [email or web address] Date: 11/14/2022 Prepared by: Westlee Devita  Exercises - Seated Heel Toe Raises  - 1 x daily - 7 x weekly - 2 sets - 10 reps - 5 hold - Seated Hip Internal Rotation AROM  - 1 x daily - 7 x weekly - 2 sets - 10 reps - 5 hold - Bilateral Long Arc Quad  - 1 x daily - 7 x weekly - 2 sets - 10 reps - 5 hold - Foam Balance Tandem stance  - 1 x daily - 7 x weekly - 2 sets - 2 reps - 30 hold - Single Leg Balance on Foam  - 1 x daily - 7 x weekly - 2 sets - 30 reps - 2 hold - Seated Hip Flexion March with Ankle Weights  - 1 x daily - 7 x weekly - 2 sets - 10 reps - 5 hold - Standing Hip Abduction with Ankle Weight  - 1 x daily - 7 x weekly - 2 sets - 10 reps - 5 hold - Standing Hip Extension with Ankle Weight  - 1 x daily - 7 x weekly - 2 sets - 10 reps - 5 hold - Standing Hip Flexion with Ankle Weight  - 1 x daily - 7 x weekly - 2 sets - 10 reps - 5 hold - Seated Long Arc Quad with Ankle Weight  - 1 x daily - 7 x weekly - 2 sets - 10 reps - 5 hold Access Code: [email or web address] URL: [email or web address] Date: 08/22/2023 Prepared by: Carry Ortez  Exercises - Seated Finger Tip Pinch with Putty  - 1 x daily - 7 x weekly - 2 sets - 10 reps - 5 hold - Hand Squeezes  - 1 x daily - 7  x weekly - 2 sets - 10 reps - 5 hold - Key Pinch with Putty  - 1 x daily - 7 x weekly - 2 sets - 10 reps - 5 hold  GOALS: Goals reviewed with patient? Yes  SHORT TERM GOALS: Target date: 05/09/2023   Patient will be independent in home exercise program to improve strength/mobility for better functional independence with ADLs. Baseline: 2/21: HEP give next session 5/15 : intermittent compliance 8/22: intermittent compliance due to illness 11/14: compliant  Goal status: MET    LONG TERM GOALS: Target date: 01/16/2024    Patient will increase FOTO score to equal to or greater than  64%   to demonstrate statistically significant improvement in mobility and quality of life.  Baseline: 2/21: 54% 8/22: 52% 10/17: 57% 11/14: 53% 1/16: 47% Goal status: terminated  2.  Patient will increase Berg Balance score by >  6 points (42/56)to demonstrate decreased fall risk during functional activities. Baseline: 2/21: 36/56 5/15: 45   Goal status: MET  3.  Patient will increase six minute walk test distance to >1500 for progression to age norm community ambulator and improve gait ability Baseline: 2/21: 1200 ft with [facility] 5/15: 1300 ft with [facility]  8/22: 905 ft with [facility] 10/17: 1100 ft with [facility] 11/14: 1110 ft with [facility] 1/16: 1210 ft with [facility] 3/6: 1100 ft with two canes 5/29: 1195 ft with [facility]  Goal status: In progress  4.  Patient will increase BLE gross strength to 4+/5  or within 1 lb of each LE as to improve functional strength for independent gait, increased standing tolerance and increased ADL ability. Baseline: 2/21 :see above 5/15: see above 10/17: RLE 5/5 LLE 4/5 L ankle 3/5  11/14: RLE 5/5 LLE 4+/5 hamstring 4/5, ankle: 3+/5 1/16: RLE 5/5 LLE 4+/5 ankle 4-/5 3/6: see above 5/29: RLE 5/5 ankle 4-/5 LLE 4+/5 ankle 4-/5  Goal status:  In progress / modified  6.  Patient will increase Berg Balance score by > 6 points (51/56) to demonstrate decreased fall risk during functional activities. Baseline:   5/15: 45 8/22: 41/56 10/17: 28 11/14: 36 1/16: 36 3/6: 25 5/29: 34/56  Goal status: Ongoing  7.   Patient will increase UE strength to >4+/5 for ADLs, AD usage, and quality of life.  Baseline: 5/15: see above 8/22: see above 11/14: >4+/5 bilaterally  Goal status: MET  8.   Patient will increase lower extremity functional scale to >60/80 to demonstrate improved functional mobility and increased tolerance with ADLs.  Baseline: 11/14: 29%  1/16: 29%  3/6: 19%  5/29: 36%  Goal status: ongoing   ASSESSMENT:  CLINICAL IMPRESSION: ***  Patient will benefit from skilled physical therapy to improve strength, mobility, and stability for improved quality of life.   OBJECTIVE IMPAIRMENTS: Abnormal gait, decreased activity tolerance, decreased balance, decreased coordination, decreased endurance, decreased mobility, difficulty walking, decreased ROM, decreased strength, hypomobility, impaired flexibility, improper body mechanics, and postural dysfunction.   ACTIVITY LIMITATIONS: carrying, lifting, bending, standing, squatting, stairs, transfers, reach over head, locomotion level, and caring for others  PARTICIPATION LIMITATIONS: meal prep, cleaning, laundry, personal finances, driving, shopping, community activity, and yard work  PERSONAL FACTORS: Age, Past/current experiences, Time since onset of injury/illness/exacerbation, and 3+ comorbidities: DHD, arthritis, concussion COVID, HTN, multiple lacunar infarcts, MVP, pernicious anemia, rocky mountain spotted fever, spinal stenosis, vestibular migraine, vitamin B deficiency.  are also affecting patient's functional outcome.   REHAB POTENTIAL: Good  CLINICAL DECISION MAKING: Evolving/moderate complexity  EVALUATION COMPLEXITY: Moderate  PLAN:   PT FREQUENCY: 1x/week  PT DURATION: 12 weeks  PLANNED INTERVENTIONS: Therapeutic exercises, Therapeutic activity, Neuromuscular re-education, Balance training, Gait training, Patient/Family education,  Self Care, Joint mobilization, Stair training, Vestibular training, Canalith repositioning, Visual/preceptual remediation/compensation, Orthotic/Fit training, DME instructions, Cognitive remediation, Electrical stimulation, Spinal mobilization, Cryotherapy, Moist heat, Splintting, Taping, Traction, Ultrasound, Manual therapy, and Re-evaluation  PLAN FOR NEXT SESSION:  balance, strength, unstable surfaces    Hagar Sadiq  Brain Cahill, PT, DPT Physical Therapist - [facility]  Outpatient Physical Therapy- Main Campus [phone]

## 2024-01-28 ENCOUNTER — Ambulatory Visit: Attending: Neurosurgery

## 2024-01-28 ENCOUNTER — Ambulatory Visit: Admitting: Occupational Therapy

## 2024-01-28 DIAGNOSIS — R278 Other lack of coordination: Secondary | ICD-10-CM | POA: Insufficient documentation

## 2024-01-28 DIAGNOSIS — R2681 Unsteadiness on feet: Secondary | ICD-10-CM | POA: Diagnosis not present

## 2024-01-28 DIAGNOSIS — M6281 Muscle weakness (generalized): Secondary | ICD-10-CM

## 2024-01-28 DIAGNOSIS — R262 Difficulty in walking, not elsewhere classified: Secondary | ICD-10-CM | POA: Diagnosis not present

## 2024-01-28 NOTE — Therapy (Signed)
 OUTPATIENT OCCUPATIONAL THERAPY NEURO TREATMENT NOTE  Patient Name: [name] MRN: [id number] DOB:September 24, 1960, 63 y.o., male Today's Date: 01/28/2024  PCP: Aileen Alexanders, NP REFERRING PROVIDER: Rosan Comfort, MD  END OF SESSION:  OT End of Session - 01/28/24 1832     Visit Number 9    Number of Visits 24    Date for OT Re-Evaluation 01/30/24    OT Start Time 1530    OT Stop Time 1615    OT Time Calculation (min) 45 min    Activity Tolerance Patient tolerated treatment well    Behavior During Therapy [facility] for tasks assessed/performed              Past Medical History:  Diagnosis Date   ADHD    Arthritis    Concussion    COVID 12/2020   Hypertension    Multiple lacunar infarcts (HCC)    MVP (mitral valve prolapse)    Pernicious anemia    [place] spotted fever    Spinal stenosis    Stroke [facility])    Vestibular migraine    Vitamin B12 deficiency    Vitamin D  deficiency    Past Surgical History:  Procedure Laterality Date   ANTERIOR CERVICAL DECOMP/DISCECTOMY FUSION N/A 07/30/2022   Procedure: C4-7 ANTERIOR CERVICAL DISCECTOMY AND FUSION (GLOBUS HEDRON);  Surgeon: Jodeen Munch, MD;  Location: [facility] ORS;  Service: Neurosurgery;  Laterality: N/A;   BILATERAL CARPAL TUNNEL RELEASE     CERVICAL WOUND DEBRIDEMENT N/A 08/01/2022   Procedure: CERVICAL WOUND DEBRIDEMENT OF HEMATOMA;  Surgeon: Jodeen Munch, MD;  Location: [facility] ORS;  Service: Neurosurgery;  Laterality: N/A;   COLONOSCOPY     COLONOSCOPY WITH PROPOFOL  N/A 12/17/2023   Procedure: COLONOSCOPY WITH PROPOFOL ;  Surgeon: Shane Darling, MD;  Location: [facility] ENDOSCOPY;  Service: Endoscopy;  Laterality: N/A;   ESOPHAGOGASTRODUODENOSCOPY (EGD) WITH PROPOFOL  N/A 12/17/2023   Procedure: ESOPHAGOGASTRODUODENOSCOPY (EGD) WITH PROPOFOL ;  Surgeon: Shane Darling, MD;  Location: [facility] ENDOSCOPY;  Service: Endoscopy;  Laterality: N/A;   FRACTURE SURGERY Left 1984   intramedullary rod   hip  bone transplant  1984   LEG SURGERY Left    oseotomy   POLYPECTOMY  12/17/2023   Procedure: POLYPECTOMY, INTESTINE;  Surgeon: Shane Darling, MD;  Location: [facility] ENDOSCOPY;  Service: Endoscopy;;   REPAIR ANKLE LIGAMENT Right 1978   REPAIR KNEE LIGAMENT Bilateral    acl   TRIGGER FINGER RELEASE Bilateral    thumbs   Patient Active Problem List   Diagnosis Date Noted   Lacunar stroke (HCC) 12/12/2023   Essential tremor 12/12/2023   Dizziness 10/30/2023   Postoperative hematoma of musculoskeletal structure following musculoskeletal procedure 08/01/2022   Cervical myelopathy (HCC) 07/30/2022   Cervical spinal stenosis 07/30/2022   Radiculopathy, cervical 07/30/2022   S/P cervical spinal fusion 07/30/2022   Vitamin D  deficiency 06/25/2022   B12 deficiency 06/25/2022   Elevated LDL cholesterol level 06/25/2022   Prediabetes 03/27/2021   History of nausea- reported 01/27/2020 02/02/2020   Accidental fall from ladder 02/02/2020   Acute right-sided thoracic back pain 02/02/2020   Neck stiffness 02/02/2020   Concussion with no loss of consciousness 02/02/2020   Multiple falls 02/02/2020   Head trauma, initial encounter 02/02/2020   Acute post-traumatic headache, not intractable 02/02/2020   Essential hypertension 08/26/2018   Pre-bariatric surgery nutrition evaluation 06/19/2018   Migraine with vertigo 01/05/2014   Arthralgia of multiple joints 04/28/2009   Carpal tunnel syndrome 08/30/2008    ONSET DATE: 07/30/2022  REFERRING DIAG: ACDF, Hx of CVAs  THERAPY DIAG:  Muscle weakness (generalized)  Rationale for Evaluation and Treatment: Rehabilitation  SUBJECTIVE:   SUBJECTIVE STATEMENT:  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   next scheduled visit.  Pt accompanied by: self  PERTINENT HISTORY: Pt. underwent an ACDF C4-C7. L5-S1 anterolithesis, and disc degeneration causing marked biforminal L5 impingement, Spinal stenosis from L1-2 to  L3-4 with L2-3 being the most advanced. Bilateral hand tremors. Bilateral Lacunar infarcts in 2020. See above list for PMHx .  PRECAUTIONS: None  WEIGHT BEARING RESTRICTIONS: No  PAIN:  Are you having pain? None reported  FALLS: Has patient fallen in last 6 months? Yes. Number of falls 6-10 falls  LIVING ENVIRONMENT: Lives with: lives with their spouse Lives in: House/apartment 2 levels Stairs: 4-5 steps. Has following equipment at home: Single point cane, Walker - 2 wheeled, and shower chair  PLOF: Independent  PATIENT GOALS:  To improve bilateral hand cooridnation  OBJECTIVE:  Note: Objective measures were completed at Evaluation unless otherwise noted.  HAND DOMINANCE: Right hand dominant  ADLs:  Transfers/ambulation related to ADLs: Eating: feels like hand, and mouth are not working together, drinks drip out of mouth, Difficulty using fork, and knife together for self-feeding. Grooming: Independent brushing teeth UB Dressing: Difficulty buttoning LB Dressing: Difficulty managing pants, shoes, and socks Toileting: Independent Bathing: incoordination with applying shampoo with bilateral hands Tub Shower transfers: Distant supervision   IADLs: Shopping: Delivery Light housekeeping: Difficulty with vacuuming-bending over Meal Prep:  Independent Community mobility:  Independent Medication management: Wife sets up pillbox-Pt. Takes pills independently  Financial management: No changes Handwriting: 75% legible  MOBILITY STATUS: Hx of falls  POSTURE COMMENTS:   Sitting balance: good  ACTIVITY TOLERANCE: Activity tolerance: Fair   UPPER EXTREMITY ROM:    Active ROM Right Eval WFL Left Eval WFL  (Blank rows = not tested)  UPPER EXTREMITY MMT:     MMT Right Eval  Left Eval   Shoulder flexion 4+/5 4+/5  Shoulder abduction 4+/5 4+/5  Shoulder adduction    Shoulder extension    Shoulder internal rotation    Shoulder external rotation    Middle  trapezius    Lower trapezius    Elbow flexion 5/5 5/5  Elbow extension 5/5 5/5  Wrist flexion    Wrist extension 5/5 5/5  Wrist ulnar deviation    Wrist radial deviation    Wrist pronation    Wrist supination    (Blank rows = not tested)  HAND FUNCTION: Grip strength: Right: 48 lbs; Left: 44 lbs, Lateral pinch: Right: 17 lbs, Left: 16 lbs, and 3 point pinch: Right: 12 lbs, Left: 19 lbs  COORDINATION: 9 Hole Peg test: Right: 25 sec; Left: 26 sec  SENSATION:  Pt. reports no changes  EDEMA: N/A  MUSCLE TONE: Intact;  Atrophy at the right dorsal interossei muscles.  COGNITION: Overall cognitive status: WFL for tasks performed  VISION:  Wears glasses. No change from baseline   PERCEPTION: intact   OBSERVATIONS: Pt. Left his 2 wheeled scooter in the waiting/reception area  TREATMENT DATE:  01/28/24  Therapeutic Activities:   -Pt. worked on bilateral alternating motor control tasks crossing midline to simultaneously place, and switch 1.5" pegs. Pt. Was further challenged with incorporating reaching through multiple planes. -Facilitated [facility] skills grasping 1.5" large pegs, storing 4 of them in his hand them in his hand, and translatory movements moving each of them from his palm to the tip of the 2nd digit, and thumb in preparation for  reaching up to place them onto the vertical board.     PATIENT EDUCATION: Education details: OT services, POC, goals, ADL/IADL functional status. Person educated: Patient Education method: Medical illustrator Education comprehension: verbalized understanding and returned demonstration  HOME EXERCISE PROGRAM:   Hand strengthening with green theraputtty.   GOALS: Goals reviewed with patient? Yes  SHORT TERM GOALS: Target date: 12/19/2023  Pt. Will be independent with HEPs for BUEs  Baseline: Eval: No  current HEP Goal status: INITIAL  LONG TERM GOALS: Target date: 01/30/2024  Pt. Will perform buttoning with modified independence Baseline: Eval: Buttoning is difficult Goal status: INITIAL  2.  Pt. Will be able to cut food with Modified independence Baseline: Eval: Pt. Has difficulty using bilateral hand ts cut food. Goal status: INITIAL  3.  Pt. Will improve bilateral UE strength by 2 mm grades to be able to sustain bilateral UEs in elevation while coordinating bilateral hands for shampooing. Baseline: Eval: Pt. Has difficulty coordinating UEs during shampooing. Goal status: INITIAL  4.  Pt. Will demonstrate compensatory/adaptive equipment strategies for LE dressing tasks.  Baseline: Eval:Pt. Has difficulty performing LE dressing   Goal status: INITIAL  5.  Pt. Will identify 2 compensatory strategies for bilateral tremors during ADLs/IADLs Baseline: Eval: Education to be provided Goal status: INITIAL  ASSESSMENT:  CLINICAL IMPRESSION:  Pt. was able to grasp the pegs, and perform translatory movements. Pt. occasionally required cues for the correct peg color when challenged with a speed component during bilateral alternating motor control tasks while crossing midline. Pt. required increased time and tends to fatigue as the grasping task progressed. Pt. continues to improve with BUE functional reaching, and continues to work towards improving sustained BUE functional use during ADL tasks. Pt. continues to benefit from OT services to work on improving BUE functioning, and maximize independence with ADLs, and IADL tasks.   PERFORMANCE DEFICITS: in functional skills including ADLs, IADLs, coordination, dexterity, ROM, strength, pain, Fine motor control, Gross motor control, and UE functional use, cognitive skills including , and psychosocial skills including coping strategies, environmental adaptation, and routines and behaviors.   IMPAIRMENTS: are limiting patient from ADLs, IADLs, and  leisure.   CO-MORBIDITIES: may have co-morbidities  that affects occupational performance. Patient will benefit from skilled OT to address above impairments and improve overall function.  MODIFICATION OR ASSISTANCE TO COMPLETE EVALUATION: Min-Moderate modification of tasks or assist with assess necessary to complete an evaluation.  OT OCCUPATIONAL PROFILE AND HISTORY: Detailed assessment: Review of records and additional review of physical, cognitive, psychosocial history related to current functional performance.  CLINICAL DECISION MAKING: Moderate - several treatment options, min-mod task modification necessary  REHAB POTENTIAL: Good  EVALUATION COMPLEXITY: Moderate    PLAN:  OT FREQUENCY: 2x/month  OT DURATION: 12 weeks  PLANNED INTERVENTIONS: 97535 self care/ADL training, 93235 therapeutic exercise, 97530 therapeutic activity, 97112 neuromuscular re-education, 97140 manual therapy, 97018 paraffin, 57322 moist heat, 97034 contrast bath, energy conservation, patient/family education, and DME and/or AE instructions  RECOMMENDED OTHER SERVICES: PT  CONSULTED AND AGREED WITH PLAN OF  CARE: Patient  PLAN FOR NEXT SESSION: Treatment  Ashanti Ratti, MS, OTR/L

## 2024-01-30 ENCOUNTER — Encounter

## 2024-01-31 ENCOUNTER — Encounter: Payer: Self-pay | Admitting: Neurosurgery

## 2024-02-03 ENCOUNTER — Other Ambulatory Visit: Payer: Self-pay | Admitting: Emergency Medicine

## 2024-02-03 DIAGNOSIS — Z79899 Other long term (current) drug therapy: Secondary | ICD-10-CM

## 2024-02-04 ENCOUNTER — Ambulatory Visit: Payer: Self-pay | Admitting: Student

## 2024-02-04 LAB — BASIC METABOLIC PANEL WITH GFR
BUN/Creatinine Ratio: 24 (ref 10–24)
BUN: 19 mg/dL (ref 8–27)
CO2: 22 mmol/L (ref 20–29)
Calcium: 10 mg/dL (ref 8.6–10.2)
Chloride: 99 mmol/L (ref 96–106)
Creatinine, Ser: 0.79 mg/dL (ref 0.76–1.27)
Glucose: 106 mg/dL — ABNORMAL HIGH (ref 70–99)
Potassium: 4.5 mmol/L (ref 3.5–5.2)
Sodium: 140 mmol/L (ref 134–144)
eGFR: 100 mL/min/{1.73_m2} (ref >59)

## 2024-02-04 NOTE — Progress Notes (Signed)
 Last read by Dineen Francois at 3:46PM on 02/04/2024.

## 2024-02-05 ENCOUNTER — Telehealth: Payer: Self-pay

## 2024-02-05 ENCOUNTER — Encounter: Payer: Self-pay | Admitting: Nurse Practitioner

## 2024-02-05 DIAGNOSIS — R634 Abnormal weight loss: Secondary | ICD-10-CM | POA: Diagnosis not present

## 2024-02-05 NOTE — Telephone Encounter (Unsigned)
 Copied from CRM [id number]. Topic: Clinical - Medical Advice >> Feb 05, 2024  2:52 PM Sasha H wrote: Reason for CRM: Pt's wife called in wanting a prescription and medical notes for the pt to get a rollator , fax number is [phone]. Company is [employer] , pts wife would like a follow up when it is sent over

## 2024-02-05 NOTE — Therapy (Signed)
 OUTPATIENT PHYSICAL THERAPY NEURO TREATMENT/ Physical Therapy Progress Note   Dates of reporting period  09/05/23   to   02/06/24       Patient Name: [name] MRN: [id number] DOB:[birth year], 63 y.o., male Today's Date: 02/06/2024   PCP: Aileen Alexanders NP REFERRING PROVIDER: Noble Bateman PA  END OF SESSION:  PT End of Session - 02/06/24 1357     Visit Number 40    Number of Visits 50    Date for PT Re-Evaluation 04/09/24    Authorization Type eval 2/21    PT Start Time 1400    PT Stop Time 1444    PT Time Calculation (min) 44 min    Equipment Utilized During Treatment Gait belt    Activity Tolerance Patient tolerated treatment well    Behavior During Therapy [facility] for tasks assessed/performed                                              Past Medical History:  Diagnosis Date   ADHD    Arthritis    Concussion    COVID 12/2020   Hypertension    Multiple lacunar infarcts (HCC)    MVP (mitral valve prolapse)    Pernicious anemia    Rocky Mountain spotted fever    Spinal stenosis    Stroke [facility])    Vestibular migraine    Vitamin B12 deficiency    Vitamin D  deficiency    Past Surgical History:  Procedure Laterality Date   ANTERIOR CERVICAL DECOMP/DISCECTOMY FUSION N/A 07/30/2022   Procedure: C4-7 ANTERIOR CERVICAL DISCECTOMY AND FUSION (GLOBUS HEDRON);  Surgeon: Jodeen Munch, MD;  Location: [facility] ORS;  Service: Neurosurgery;  Laterality: N/A;   BILATERAL CARPAL TUNNEL RELEASE     CERVICAL WOUND DEBRIDEMENT N/A 08/01/2022   Procedure: CERVICAL WOUND DEBRIDEMENT OF HEMATOMA;  Surgeon: Jodeen Munch, MD;  Location: [facility] ORS;  Service: Neurosurgery;  Laterality: N/A;   COLONOSCOPY     COLONOSCOPY WITH PROPOFOL  N/A 12/17/2023   Procedure: COLONOSCOPY WITH PROPOFOL ;  Surgeon: Shane Darling, MD;  Location: [facility] ENDOSCOPY;  Service: Endoscopy;  Laterality: N/A;   ESOPHAGOGASTRODUODENOSCOPY (EGD) WITH  PROPOFOL  N/A 12/17/2023   Procedure: ESOPHAGOGASTRODUODENOSCOPY (EGD) WITH PROPOFOL ;  Surgeon: Shane Darling, MD;  Location: [facility] ENDOSCOPY;  Service: Endoscopy;  Laterality: N/A;   FRACTURE SURGERY Left 1984   intramedullary rod   hip bone transplant  1984   LEG SURGERY Left    oseotomy   POLYPECTOMY  12/17/2023   Procedure: POLYPECTOMY, INTESTINE;  Surgeon: Shane Darling, MD;  Location: [facility] ENDOSCOPY;  Service: Endoscopy;;   REPAIR ANKLE LIGAMENT Right 1978   REPAIR KNEE LIGAMENT Bilateral    acl   TRIGGER FINGER RELEASE Bilateral    thumbs   Patient Active Problem List   Diagnosis Date Noted   Lacunar stroke (HCC) 12/12/2023   Essential tremor 12/12/2023   Dizziness 10/30/2023   Postoperative hematoma of musculoskeletal structure following musculoskeletal procedure 08/01/2022   Cervical myelopathy (HCC) 07/30/2022   Cervical spinal stenosis 07/30/2022   Radiculopathy, cervical 07/30/2022   S/P cervical spinal fusion 07/30/2022   Vitamin D  deficiency 06/25/2022   B12 deficiency 06/25/2022   Elevated LDL cholesterol level 06/25/2022   Prediabetes 03/27/2021   History of nausea- reported 01/27/2020 02/02/2020   Accidental fall from ladder 02/02/2020   Acute right-sided thoracic back pain 02/02/2020  Neck stiffness 02/02/2020   Concussion with no loss of consciousness 02/02/2020   Multiple falls 02/02/2020   Head trauma, initial encounter 02/02/2020   Acute post-traumatic headache, not intractable 02/02/2020   Essential hypertension 08/26/2018   Pre-bariatric surgery nutrition evaluation 06/19/2018   Migraine with vertigo 01/05/2014   Arthralgia of multiple joints 04/28/2009   Carpal tunnel syndrome 08/30/2008    ONSET DATE: summer 2022  REFERRING DIAG: LE weakness and gait instability   THERAPY DIAG:  Muscle weakness (generalized)  Difficulty in walking, not elsewhere classified  Unsteadiness on feet  Rationale for Evaluation and Treatment:  Rehabilitation  SUBJECTIVE:                                                                                                                                                                                             SUBJECTIVE STATEMENT: Patient presents with crutches, reports [place] to practice using them.   Patient had new study: Conclusion: This is an abnormal study.  1. There is electrodiagnostic evidence of chronic, severe right L5-S1 radiculopathy with ongoing active denervation. A left L5 radiculopathy is also likely but was not fully assessed on today's testing. Consider lumbar spine imaging if not already performed. 2. There is electrodiagnostic and sonographic evidence of a chronic, severe right ulnar neuropathy at the elbow with ongoing active denervation.  3. There is no electrodiagnostic evidence for large fiber polyneuropathy.   Pt accompanied by: self  PERTINENT HISTORY:  Patient presents for LE weakness and gait instability. He is s/p ACDF C-7 on 07/30/22. Patient has a leg length discrepancy and limited L knee flexion since his 20's. Patient has a lot of atrophy and lower back pain, foot drop bilaterally.  First symptoms was stumbling up stairs, with LLE. Foot drop was improved after ACDF. Now has numbness and tingling at the end of the day. Does have a few lesions in his pons per wife, on blood pressure medication now. PMH includes ADHD, arthritis, concussion COVID, HTN, multiple lacunar infarcts, MVP, pernicious anemia, rocky mountain spotted fever, spinal stenosis, vestibular migraine, vitamin B deficiency.   Patient had new study: Conclusion: This is an abnormal study.  1. There is electrodiagnostic evidence of chronic, severe right L5-S1 radiculopathy with ongoing active denervation. A left L5 radiculopathy is also likely but was not fully assessed on today's testing. Consider lumbar spine imaging if not already  performed. 2. There is electrodiagnostic and sonographic evidence of a chronic, severe right ulnar neuropathy at the elbow with ongoing active denervation.  3. There is no electrodiagnostic evidence for large  fiber polyneuropathy.   PAIN:  Are you having pain? Denies pain   PRECAUTIONS: Other: cervical; no lifting more than 20 lb   WEIGHT BEARING RESTRICTIONS: No  FALLS: Has patient fallen in last 6 months? Yes. Number of falls multiple a week  LIVING ENVIRONMENT: Lives with: lives with their family Lives in: House/apartment Stairs: Yes: Internal: flight steps; is sleeping downstairs Has following equipment at home: Single point cane  PLOF: Independent, uses a walking stick  PATIENT GOALS: gait control of feet, strengthen legs, balance, atrophy in hand  OBJECTIVE:   DIAGNOSTIC FINDINGS:  09/11/22: No acute fracture. No spondylolisthesis. No compression deformities. Disc space narrowing C3-4 with marginal osteophytes consistent with degenerative disc disease. Postop changes ACDF C4-C7 with anatomic alignment. Normal prevertebral and cervicocranial soft tissues.   06/19/22: 1. L5 chronic pars defects with L5-S1 anterolisthesis and advanced disc degeneration causing marked biforaminal L5 impingement. 2. Degeneration and short pedicles causes compressive spinal stenosis from L1-2 to L3-4, most advanced at L2-3. Moderate bilateral foraminal narrowing at the same levels.   COGNITION: Overall cognitive status: Within functional limits for tasks assessed    COORDINATION: Limited LLE  MUSCLE LENGTH: Significant limitation L hamstring; 60 degree flexion deficit.   POSTURE: weight shift to the R  LOWER EXTREMITY ROM:     L knee flexion contraction -60 contraction LOWER EXTREMITY MMT:    MMT Right Eval R 5/15 R 8/22 Left Eval L 5/15 L 8/22  Hip flexion 4 5 5 4  4+ 4  Hip extension        Hip abduction 4- 4 4 4- 4 4  Hip adduction 4- 4 4 4- 4- 4  Hip internal rotation         Hip external rotation        Knee flexion 4- 4 4 3  4- 4-  Knee extension 4 4+ 5 4 4  4-  Ankle dorsiflexion 3 3 3+ 2+ 2+ 2+  Ankle plantarflexion 3 3+ 4- 3 3+ 3+  Ankle inversion        Ankle eversion        (Blank rows = not tested)  01/02/23 Right 8/22: RECERT  10/17 PN Left 8/22: Recert LUE 10/17: PN   Shoulder Flexion 4 4+ 5 4- 4+ 4+  Shoulder Abduction 4- 4 4+ 4- 4 4+  Shoulder Extension 4- 4+ 4 4- 4   Shoulder adduction 4- 4  3+ 4 4+  Bicep  4 4+ 5 4 4+ 4  Triceps 4- 4- 5 3+ 4 4     Right Left  Hip flexion 12.2 10.2  Hip Abduction 7.4 7.0  Hip Adduction 10.8 10.5  Knee Extension  13.8 13.4  Knee Flexion 13.4 13.2  DF    PF       BED MOBILITY:  Perform next session  TRANSFERS: Assistive device utilized: Single point cane  Sit to stand: CGA Stand to sit: CGA Chair to chair: CGA   GAIT: Gait pattern: L knee limited extension, L foot drop, L foot eversion Distance walked: 1200 Assistive device utilized: Single point cane Level of assistance: CGA Comments: Patient has increased eversion and rolling of L foot, LLE leg length discrepancy.   FUNCTIONAL TESTS:  5 times sit to stand: 5.6 seconds with minor use of LLE no UE support Timed up and go (TUG): n/a 6 minute walk test: 1200 ft 10 meter walk test: 7 seconds BERG: 36/56   PATIENT SURVEYS:  FOTO 54  TODAY'S TREATMENT:  DATE: 02/06/24 Unless otherwise stated, CGA was provided and gait belt donned in order to ensure pt safety   Physical therapy treatment session today consisted of completing assessment of goals and administration of testing as demonstrated and documented in flow sheet, treatment, and goals section of this note. Addition treatments may be found below.   [facility] PT Assessment - 02/06/24 0001       Standardized Balance Assessment   Standardized Balance Assessment  Berg Balance Test      Berg Balance Test   Sit to Stand Able to stand without using hands and stabilize independently    Standing Unsupported Able to stand safely 2 minutes    Sitting with Back Unsupported but Feet Supported on Floor or Stool Able to sit safely and securely 2 minutes    Stand to Sit Sits safely with minimal use of hands    Transfers Able to transfer safely, minor use of hands    Standing Unsupported with Eyes Closed Able to stand 10 seconds with supervision    Standing Unsupported with Feet Together Able to place feet together independently and stand for 1 minute with supervision    From Standing, Reach Forward with Outstretched Arm Reaches forward but needs supervision    From Standing Position, Pick up Object from Floor Able to pick up shoe, needs supervision    From Standing Position, Turn to Look Behind Over each Shoulder Turn sideways only but maintains balance    Turn 360 Degrees Able to turn 360 degrees safely but slowly    Standing Unsupported, Alternately Place Feet on Step/Stool Able to complete 4 steps without aid or supervision    Standing Unsupported, One Foot in Front Able to take small step independently and hold 30 seconds    Standing on One Leg Tries to lift leg/unable to hold 3 seconds but remains standing independently    Total Score 39          TherAct: In // bars: Walk forward/backwards without UE support 8x length; cue for picking up feet.  10x STS  Progressive marching into RTB across // bars: -BUE support to SUE support to no UE support 10x each direction   TherEx: Seated LAQ 10x hold 3 seconds   Seated with dynadisc under foot: -pf 15x -df 15x -eversion/inversion 15x -circles 10x     Neuro Re-ed:  Standing with CGA next to support surface:   Airex balance beam:  -lateral stepping 6x length of // bars -tandem walk 6x length of // bars; finger tip support  -static stand hold 30 seconds x 2 trials    PATIENT EDUCATION: Education  details: goals, POC, educated on safety during updated exercises  Person educated: Patient and Spouse Education method: Explanation, Demonstration, Tactile cues, and Verbal cues Education comprehension: verbalized understanding, returned demonstration, verbal cues required, and tactile cues required  HOME EXERCISE PROGRAM: Access Code: [email or web address] URL: [email or web address] Date: 10/11/2022 Prepared by: Darrious Youman  Exercises - Seated Ankle Alphabet  - 1 x daily - 7 x weekly - 2 sets - 10 reps - 5 hold - Seated Heel Toe Raises  - 1 x daily - 7 x weekly - 2 sets - 10 reps - 5 hold - Standing March with Counter Support  - 1 x daily - 7 x weekly - 2 sets - 10 reps - 5 hold  Access Code: [email or web address] URL: [email or web address] Date: 10/30/2022 Prepared by: Franke Menter  Exercises - Seated Ankle Plantarflexion with Resistance  - 1 x daily -  7 x weekly - 2 sets - 10 reps - 5 hold - Seated Ankle Eversion with Resistance  - 1 x daily - 7 x weekly - 2 sets - 10 reps - 5 hold  Access Code: [email or web address] URL: [email or web address] Date: 11/14/2022 Prepared by: Lynde Ludwig  Exercises - Seated Heel Toe Raises  - 1 x daily - 7 x weekly - 2 sets - 10 reps - 5 hold - Seated Hip Internal Rotation AROM  - 1 x daily - 7 x weekly - 2 sets - 10 reps - 5 hold - Bilateral Long Arc Quad  - 1 x daily - 7 x weekly - 2 sets - 10 reps - 5 hold - Foam Balance Tandem stance  - 1 x daily - 7 x weekly - 2 sets - 2 reps - 30 hold - Single Leg Balance on Foam  - 1 x daily - 7 x weekly - 2 sets - 30 reps - 2 hold - Seated Hip Flexion March with Ankle Weights  - 1 x daily - 7 x weekly - 2 sets - 10 reps - 5 hold - Standing Hip Abduction with Ankle Weight  - 1 x daily - 7 x weekly - 2 sets - 10 reps - 5 hold - Standing Hip Extension with Ankle Weight  - 1 x daily - 7 x weekly - 2 sets - 10 reps - 5 hold - Standing Hip Flexion with Ankle Weight  - 1 x daily - 7 x weekly - 2 sets - 10  reps - 5 hold - Seated Long Arc Quad with Ankle Weight  - 1 x daily - 7 x weekly - 2 sets - 10 reps - 5 hold Access Code: [email or web address] URL: [email or web address] Date: 08/22/2023 Prepared by: Audrey Eller  Exercises - Seated Finger Tip Pinch with Putty  - 1 x daily - 7 x weekly - 2 sets - 10 reps - 5 hold - Hand Squeezes  - 1 x daily - 7 x weekly - 2 sets - 10 reps - 5 hold - Key Pinch with Putty  - 1 x daily - 7 x weekly - 2 sets - 10 reps - 5 hold  GOALS: Goals reviewed with patient? Yes  SHORT TERM GOALS: Target date: 05/09/2023   Patient will be independent in home exercise program to improve strength/mobility for better functional independence with ADLs. Baseline: 2/21: HEP give next session 5/15 : intermittent compliance 8/22: intermittent compliance due to illness 11/14: compliant  Goal status: MET    LONG TERM GOALS: Target date: 04/09/24    Patient will increase FOTO score to equal to or greater than  64%   to demonstrate statistically significant improvement in mobility and quality of life.  Baseline: 2/21: 54% 8/22: 52% 10/17: 57% 11/14: 53% 1/16: 47% Goal status: terminated  2.  Patient will increase Berg Balance score by > 6 points (42/56)to demonstrate decreased fall risk during functional activities. Baseline: 2/21: 36/56 5/15: 45   Goal status: MET  3.  Patient will increase six minute walk test distance to >1500 for progression to age norm community ambulator and improve gait ability Baseline: 2/21: 1200 ft with [facility] 5/15: 1300 ft with [facility]  8/22: 905 ft with [facility] 10/17: 1100 ft with [facility] 11/14: 1110 ft with [facility] 1/16: 1210 ft with [facility] 3/6: 1100 ft with two canes 5/29: 1195 ft with [facility] 6/19: defer due to patient bringing crutches in today Goal status: In progress  4.  Patient  will increase BLE gross strength to 4+/5  or within 1 lb of each LE as to improve functional strength for independent gait, increased standing tolerance and increased ADL  ability. Baseline: 2/21 :see above 5/15: see above 10/17: RLE 5/5 LLE 4/5 L ankle 3/5  11/14: RLE 5/5 LLE 4+/5 hamstring 4/5, ankle: 3+/5 1/16: RLE 5/5 LLE 4+/5 ankle 4-/5 3/6: see above 5/29: RLE 5/5 ankle 4-/5 LLE 4+/5 ankle 4-/5 6/19: L df 3-, pf 3; R df 3+, pf 4- Goal status:  In progress / modified  6.  Patient will increase Berg Balance score by > 6 points (51/56) to demonstrate decreased fall risk during functional activities. Baseline:  5/15: 45 8/22: 41/56 10/17: 28 11/14: 36 1/16: 36 3/6: 25 5/29: 34/56  6/19: 39/56  Goal status: Ongoing  7.   Patient will increase UE strength to >4+/5 for ADLs, AD usage, and quality of life.  Baseline: 5/15: see above 8/22: see above 11/14: >4+/5 bilaterally  Goal status: MET  8.   Patient will increase lower extremity functional scale to >60/80 to demonstrate improved functional mobility and increased tolerance with ADLs.  Baseline: 11/14: 29%  1/16: 29%  3/6: 19%  5/29: 36  6/19: 14/80 Goal status: ongoing   ASSESSMENT:  CLINICAL IMPRESSION: Patient demonstrates improved balance throughout session and during BERG score. He did self score his LEFS lower this session and continues to have noticeable atrophy of bilateral calves.  Patient's condition has the potential to improve in response to therapy. Maximum improvement is yet to be obtained. The anticipated improvement is attainable and reasonable in a generally predictable time.   Patient will benefit from skilled physical therapy to improve strength, mobility, and stability for improved quality of life.   OBJECTIVE IMPAIRMENTS: Abnormal gait, decreased activity tolerance, decreased balance, decreased coordination, decreased endurance, decreased mobility, difficulty walking, decreased ROM, decreased strength, hypomobility, impaired flexibility, improper body mechanics, and postural dysfunction.   ACTIVITY LIMITATIONS: carrying, lifting, bending, standing, squatting, stairs, transfers, reach over  head, locomotion level, and caring for others  PARTICIPATION LIMITATIONS: meal prep, cleaning, laundry, personal finances, driving, shopping, community activity, and yard work  PERSONAL FACTORS: Age, Past/current experiences, Time since onset of injury/illness/exacerbation, and 3+ comorbidities: DHD, arthritis, concussion COVID, HTN, multiple lacunar infarcts, MVP, pernicious anemia, rocky mountain spotted fever, spinal stenosis, vestibular migraine, vitamin B deficiency.  are also affecting patient's functional outcome.   REHAB POTENTIAL: Good  CLINICAL DECISION MAKING: Evolving/moderate complexity  EVALUATION COMPLEXITY: Moderate  PLAN:   PT FREQUENCY: 1x/week  PT DURATION: 12 weeks  PLANNED INTERVENTIONS: Therapeutic exercises, Therapeutic activity, Neuromuscular re-education, Balance training, Gait training, Patient/Family education, Self Care, Joint mobilization, Stair training, Vestibular training, Canalith repositioning, Visual/preceptual remediation/compensation, Orthotic/Fit training, DME instructions, Cognitive remediation, Electrical stimulation, Spinal mobilization, Cryotherapy, Moist heat, Splintting, Taping, Traction, Ultrasound, Manual therapy, and Re-evaluation  PLAN FOR NEXT SESSION:  balance, strength, unstable surfaces    Desera Graffeo  Brain Cahill, PT, DPT Physical Therapist - [facility]  Outpatient Physical Therapy- Main Campus [phone]

## 2024-02-06 ENCOUNTER — Ambulatory Visit: Payer: 59

## 2024-02-06 ENCOUNTER — Ambulatory Visit: Admitting: Occupational Therapy

## 2024-02-06 DIAGNOSIS — F0670 Mild neurocognitive disorder due to known physiological condition without behavioral disturbance: Secondary | ICD-10-CM | POA: Diagnosis not present

## 2024-02-06 DIAGNOSIS — M6281 Muscle weakness (generalized): Secondary | ICD-10-CM | POA: Diagnosis not present

## 2024-02-06 DIAGNOSIS — R278 Other lack of coordination: Secondary | ICD-10-CM | POA: Diagnosis not present

## 2024-02-06 DIAGNOSIS — R4681 Obsessive-compulsive behavior: Secondary | ICD-10-CM | POA: Diagnosis not present

## 2024-02-06 DIAGNOSIS — R2681 Unsteadiness on feet: Secondary | ICD-10-CM | POA: Diagnosis not present

## 2024-02-06 DIAGNOSIS — R262 Difficulty in walking, not elsewhere classified: Secondary | ICD-10-CM

## 2024-02-06 DIAGNOSIS — F840 Autistic disorder: Secondary | ICD-10-CM | POA: Diagnosis not present

## 2024-02-06 NOTE — Telephone Encounter (Signed)
 Yes he can get a Rolator but my note doesn't reflect that he needs it.  She is also inquiring about something to stimulate his appetite via mychart.  They can come back in and we can discuss both options.

## 2024-02-06 NOTE — Telephone Encounter (Signed)
 Can this be ordered for the patient? Has it been discussed at a visit?

## 2024-02-10 NOTE — Therapy (Signed)
 OUTPATIENT PHYSICAL THERAPY NEURO TREATMENT       Patient Name: [name] MRN: [id number] DOB:1960-12-19, 63 y.o., male Today's Date: 02/11/2024   PCP: Melvin Pao NP REFERRING PROVIDER: Gregory Edsel Ruth PA  END OF SESSION:  PT End of Session - 02/11/24 1518     Visit Number 41    Number of Visits 50    Date for PT Re-Evaluation 04/09/24    Authorization Type eval 2/21    PT Start Time 1617    PT Stop Time 1659    PT Time Calculation (min) 42 min    Equipment Utilized During Treatment Gait belt    Activity Tolerance Patient tolerated treatment well    Behavior During Therapy [facility] for tasks assessed/performed                                               Past Medical History:  Diagnosis Date   ADHD    Arthritis    Concussion    COVID 12/2020   Hypertension    Multiple lacunar infarcts (HCC)    MVP (mitral valve prolapse)    Pernicious anemia    [place]y [place] spotted fever    Spinal stenosis    Stroke [facility])    Vestibular migraine    Vitamin B12 deficiency    Vitamin D  deficiency    Past Surgical History:  Procedure Laterality Date   ANTERIOR CERVICAL DECOMP/DISCECTOMY FUSION N/A 07/30/2022   Procedure: C4-7 ANTERIOR CERVICAL DISCECTOMY AND FUSION (GLOBUS HEDRON);  Surgeon: Clois Fret, MD;  Location: [facility] ORS;  Service: Neurosurgery;  Laterality: N/A;   BILATERAL CARPAL TUNNEL RELEASE     CERVICAL WOUND DEBRIDEMENT N/A 08/01/2022   Procedure: CERVICAL WOUND DEBRIDEMENT OF HEMATOMA;  Surgeon: Clois Fret, MD;  Location: [facility] ORS;  Service: Neurosurgery;  Laterality: N/A;   COLONOSCOPY     COLONOSCOPY WITH PROPOFOL  N/A 12/17/2023   Procedure: COLONOSCOPY WITH PROPOFOL ;  Surgeon: Maryruth Ole DASEN, MD;  Location: [facility] ENDOSCOPY;  Service: Endoscopy;  Laterality: N/A;   ESOPHAGOGASTRODUODENOSCOPY (EGD) WITH PROPOFOL  N/A 12/17/2023   Procedure: ESOPHAGOGASTRODUODENOSCOPY (EGD) WITH PROPOFOL ;   Surgeon: Maryruth Ole DASEN, MD;  Location: [facility] ENDOSCOPY;  Service: Endoscopy;  Laterality: N/A;   FRACTURE SURGERY Left 1984   intramedullary rod   hip bone transplant  1984   LEG SURGERY Left    oseotomy   POLYPECTOMY  12/17/2023   Procedure: POLYPECTOMY, INTESTINE;  Surgeon: Maryruth Ole DASEN, MD;  Location: [facility] ENDOSCOPY;  Service: Endoscopy;;   REPAIR ANKLE LIGAMENT Right 1978   REPAIR KNEE LIGAMENT Bilateral    acl   TRIGGER FINGER RELEASE Bilateral    thumbs   Patient Active Problem List   Diagnosis Date Noted   Lacunar stroke (HCC) 12/12/2023   Essential tremor 12/12/2023   Dizziness 10/30/2023   Postoperative hematoma of musculoskeletal structure following musculoskeletal procedure 08/01/2022   Cervical myelopathy (HCC) 07/30/2022   Cervical spinal stenosis 07/30/2022   Radiculopathy, cervical 07/30/2022   S/P cervical spinal fusion 07/30/2022   Vitamin D  deficiency 06/25/2022   B12 deficiency 06/25/2022   Elevated LDL cholesterol level 06/25/2022   Prediabetes 03/27/2021   History of nausea- reported 01/27/2020 02/02/2020   Accidental fall from ladder 02/02/2020   Acute right-sided thoracic back pain 02/02/2020   Neck stiffness 02/02/2020   Concussion with no loss of consciousness 02/02/2020   Multiple  falls 02/02/2020   Head trauma, initial encounter 02/02/2020   Acute post-traumatic headache, not intractable 02/02/2020   Essential hypertension 08/26/2018   Pre-bariatric surgery nutrition evaluation 06/19/2018   Migraine with vertigo 01/05/2014   Arthralgia of multiple joints 04/28/2009   Carpal tunnel syndrome 08/30/2008    ONSET DATE: summer 2022  REFERRING DIAG: LE weakness and gait instability   THERAPY DIAG:  Muscle weakness (generalized)  Difficulty in walking, not elsewhere classified  Unsteadiness on feet  Rationale for Evaluation and Treatment: Rehabilitation  SUBJECTIVE:                                                                                                                                                                                              SUBJECTIVE STATEMENT: Patient presents with waking sticks today.  Patient had new study: Conclusion: This is an abnormal study.  1. There is electrodiagnostic evidence of chronic, severe right L5-S1 radiculopathy with ongoing active denervation. A left L5 radiculopathy is also likely but was not fully assessed on today's testing. Consider lumbar spine imaging if not already performed. 2. There is electrodiagnostic and sonographic evidence of a chronic, severe right ulnar neuropathy at the elbow with ongoing active denervation.  3. There is no electrodiagnostic evidence for large fiber polyneuropathy.   Pt accompanied by: self  PERTINENT HISTORY:  Patient presents for LE weakness and gait instability. He is s/p ACDF C-7 on 07/30/22. Patient has a leg length discrepancy and limited L knee flexion since his 20's. Patient has a lot of atrophy and lower back pain, foot drop bilaterally.  First symptoms was stumbling up stairs, with LLE. Foot drop was improved after ACDF. Now has numbness and tingling at the end of the day. Does have a few lesions in his pons per wife, on blood pressure medication now. PMH includes ADHD, arthritis, concussion COVID, HTN, multiple lacunar infarcts, MVP, pernicious anemia, rocky mountain spotted fever, spinal stenosis, vestibular migraine, vitamin B deficiency.   Patient had new study: Conclusion: This is an abnormal study.  1. There is electrodiagnostic evidence of chronic, severe right L5-S1 radiculopathy with ongoing active denervation. A left L5 radiculopathy is also likely but was not fully assessed on today's testing. Consider lumbar spine imaging if not already performed. 2. There is electrodiagnostic and sonographic evidence of a chronic, severe right ulnar neuropathy at the elbow with ongoing active denervation.  3. There is no  electrodiagnostic evidence for large fiber polyneuropathy.   PAIN:  Are you having pain? Denies pain   PRECAUTIONS: Other: cervical; no lifting more than 20 lb   WEIGHT BEARING RESTRICTIONS: No  FALLS:  Has patient fallen in last 6 months? Yes. Number of falls multiple a week  LIVING ENVIRONMENT: Lives with: lives with their family Lives in: House/apartment Stairs: Yes: Internal: flight steps; is sleeping downstairs Has following equipment at home: Single point cane  PLOF: Independent, uses a walking stick  PATIENT GOALS: gait control of feet, strengthen legs, balance, atrophy in hand  OBJECTIVE:   DIAGNOSTIC FINDINGS:  09/11/22: No acute fracture. No spondylolisthesis. No compression deformities. Disc space narrowing C3-4 with marginal osteophytes consistent with degenerative disc disease. Postop changes ACDF C4-C7 with anatomic alignment. Normal prevertebral and cervicocranial soft tissues.   06/19/22: 1. L5 chronic pars defects with L5-S1 anterolisthesis and advanced disc degeneration causing marked biforaminal L5 impingement. 2. Degeneration and short pedicles causes compressive spinal stenosis from L1-2 to L3-4, most advanced at L2-3. Moderate bilateral foraminal narrowing at the same levels.   COGNITION: Overall cognitive status: Within functional limits for tasks assessed    COORDINATION: Limited LLE  MUSCLE LENGTH: Significant limitation L hamstring; 60 degree flexion deficit.   POSTURE: weight shift to the R  LOWER EXTREMITY ROM:     L knee flexion contraction -60 contraction LOWER EXTREMITY MMT:    MMT Right Eval R 5/15 R 8/22 Left Eval L 5/15 L 8/22  Hip flexion 4 5 5 4  4+ 4  Hip extension        Hip abduction 4- 4 4 4- 4 4  Hip adduction 4- 4 4 4- 4- 4  Hip internal rotation        Hip external rotation        Knee flexion 4- 4 4 3  4- 4-  Knee extension 4 4+ 5 4 4  4-  Ankle dorsiflexion 3 3 3+ 2+ 2+ 2+  Ankle plantarflexion 3 3+ 4- 3 3+ 3+   Ankle inversion        Ankle eversion        (Blank rows = not tested)  01/02/23 Right 8/22: RECERT  10/17 PN Left 8/22: Recert LUE 10/17: PN   Shoulder Flexion 4 4+ 5 4- 4+ 4+  Shoulder Abduction 4- 4 4+ 4- 4 4+  Shoulder Extension 4- 4+ 4 4- 4   Shoulder adduction 4- 4  3+ 4 4+  Bicep  4 4+ 5 4 4+ 4  Triceps 4- 4- 5 3+ 4 4     Right Left  Hip flexion 12.2 10.2  Hip Abduction 7.4 7.0  Hip Adduction 10.8 10.5  Knee Extension  13.8 13.4  Knee Flexion 13.4 13.2  DF    PF       BED MOBILITY:  Perform next session  TRANSFERS: Assistive device utilized: Single point cane  Sit to stand: CGA Stand to sit: CGA Chair to chair: CGA   GAIT: Gait pattern: L knee limited extension, L foot drop, L foot eversion Distance walked: 1200 Assistive device utilized: Single point cane Level of assistance: CGA Comments: Patient has increased eversion and rolling of L foot, LLE leg length discrepancy.   FUNCTIONAL TESTS:  5 times sit to stand: 5.6 seconds with minor use of LLE no UE support Timed up and go (TUG): n/a 6 minute walk test: 1200 ft 10 meter walk test: 7 seconds BERG: 36/56   PATIENT SURVEYS:  FOTO 54  TODAY'S TREATMENT:  DATE: 02/11/24 Unless otherwise stated, CGA was provided and gait belt donned in order to ensure pt safety   TherAct: 6 step: -toe taps 15x each LE  Ambulate 150 ft with [employer] crutches ; patient reports he does not like them or feel stable.   Seated: Alphabet 15x    TherEx: Seated LAQ 10x hold 3 seconds Seated step over orange hurdle 12x each E  Slider:  -hamstring curl 20x each side -abduction 20x each side   Adduction squeeze 10x Adduction squeeze with heel raise 10x.    Neuro Re-ed:   Standing with CGA next to support surface:  Airex pad: static stand 30 seconds x 2 trials, noticeable trembling of  ankles/LE's with fatigue and challenge to maintain stability Airex pad: horizontal head turns  scanning room 10x ; cueing for arc of motion  Airex pad: vertical head turns 30 seconds, cueing for arc of motion, noticeable sway with upward gaze increasing demand on ankle righting reaction musculature Airex pad: one foot on 6 step one foot on airex pad, hold position for 30 seconds, switch legs, 2x each LE; Airex pad dual task word game for scanning and reaching     PATIENT EDUCATION: Education details: goals, POC, educated on safety during updated exercises  Person educated: Patient and Spouse Education method: Explanation, Demonstration, Tactile cues, and Verbal cues Education comprehension: verbalized understanding, returned demonstration, verbal cues required, and tactile cues required  HOME EXERCISE PROGRAM: Access Code: [email or web address] URL: [email or web address] Date: 10/11/2022 Prepared by: Yukio Bisping  Exercises - Seated Ankle Alphabet  - 1 x daily - 7 x weekly - 2 sets - 10 reps - 5 hold - Seated Heel Toe Raises  - 1 x daily - 7 x weekly - 2 sets - 10 reps - 5 hold - Standing March with Counter Support  - 1 x daily - 7 x weekly - 2 sets - 10 reps - 5 hold  Access Code: [email or web address] URL: [email or web address] Date: 10/30/2022 Prepared by: Sadiel Mota  Exercises - Seated Ankle Plantarflexion with Resistance  - 1 x daily - 7 x weekly - 2 sets - 10 reps - 5 hold - Seated Ankle Eversion with Resistance  - 1 x daily - 7 x weekly - 2 sets - 10 reps - 5 hold  Access Code: [email or web address] URL: [email or web address] Date: 11/14/2022 Prepared by: Merlie Noga  Exercises - Seated Heel Toe Raises  - 1 x daily - 7 x weekly - 2 sets - 10 reps - 5 hold - Seated Hip Internal Rotation AROM  - 1 x daily - 7 x weekly - 2 sets - 10 reps - 5 hold - Bilateral Long Arc Quad  - 1 x daily - 7 x weekly - 2 sets - 10 reps - 5 hold - Foam Balance Tandem stance  - 1 x  daily - 7 x weekly - 2 sets - 2 reps - 30 hold - Single Leg Balance on Foam  - 1 x daily - 7 x weekly - 2 sets - 30 reps - 2 hold - Seated Hip Flexion March with Ankle Weights  - 1 x daily - 7 x weekly - 2 sets - 10 reps - 5 hold - Standing Hip Abduction with Ankle Weight  - 1 x daily - 7 x weekly - 2 sets - 10 reps - 5 hold - Standing Hip Extension with Ankle Weight  - 1 x daily - 7 x weekly - 2 sets - 10 reps - 5 hold -  Standing Hip Flexion with Ankle Weight  - 1 x daily - 7 x weekly - 2 sets - 10 reps - 5 hold - Seated Long Arc Quad with Ankle Weight  - 1 x daily - 7 x weekly - 2 sets - 10 reps - 5 hold Access Code: [email or web address] URL: [email or web address] Date: 08/22/2023 Prepared by: Mahagony [name]  Exercises - Seated Finger Tip Pinch with Putty  - 1 x daily - 7 x weekly - 2 sets - 10 reps - 5 hold - Hand Squeezes  - 1 x daily - 7 x weekly - 2 sets - 10 reps - 5 hold - Key Pinch with Putty  - 1 x daily - 7 x weekly - 2 sets - 10 reps - 5 hold  GOALS: Goals reviewed with patient? Yes  SHORT TERM GOALS: Target date: 05/09/2023   Patient will be independent in home exercise program to improve strength/mobility for better functional independence with ADLs. Baseline: 2/21: HEP give next session 5/15 : intermittent compliance 8/22: intermittent compliance due to illness 11/14: compliant  Goal status: MET    LONG TERM GOALS: Target date: 04/09/24    Patient will increase FOTO score to equal to or greater than  64%   to demonstrate statistically significant improvement in mobility and quality of life.  Baseline: 2/21: 54% 8/22: 52% 10/17: 57% 11/14: 53% 1/16: 47% Goal status: terminated  2.  Patient will increase Berg Balance score by > 6 points (42/56)to demonstrate decreased fall risk during functional activities. Baseline: 2/21: 36/56 5/15: 45   Goal status: MET  3.  Patient will increase six minute walk test distance to >1500 for progression to age norm community  ambulator and improve gait ability Baseline: 2/21: 1200 ft with [facility] 5/15: 1300 ft with [facility]  8/22: 905 ft with [facility] 10/17: 1100 ft with [facility] 11/14: 1110 ft with [facility] 1/16: 1210 ft with [facility] 3/6: 1100 ft with two canes 5/29: 1195 ft with [facility] 6/19: defer due to patient bringing crutches in today Goal status: In progress  4.  Patient will increase BLE gross strength to 4+/5  or within 1 lb of each LE as to improve functional strength for independent gait, increased standing tolerance and increased ADL ability. Baseline: 2/21 :see above 5/15: see above 10/17: RLE 5/5 LLE 4/5 L ankle 3/5  11/14: RLE 5/5 LLE 4+/5 hamstring 4/5, ankle: 3+/5 1/16: RLE 5/5 LLE 4+/5 ankle 4-/5 3/6: see above 5/29: RLE 5/5 ankle 4-/5 LLE 4+/5 ankle 4-/5 6/19: L df 3-, pf 3; R df 3+, pf 4- Goal status:  In progress / modified  6.  Patient will increase Berg Balance score by > 6 points (51/56) to demonstrate decreased fall risk during functional activities. Baseline:  5/15: 45 8/22: 41/56 10/17: 28 11/14: 36 1/16: 36 3/6: 25 5/29: 34/56  6/19: 39/56  Goal status: Ongoing  7.   Patient will increase UE strength to >4+/5 for ADLs, AD usage, and quality of life.  Baseline: 5/15: see above 8/22: see above 11/14: >4+/5 bilaterally  Goal status: MET  8.   Patient will increase lower extremity functional scale to >60/80 to demonstrate improved functional mobility and increased tolerance with ADLs.  Baseline: 11/14: 29%  1/16: 29%  3/6: 19%  5/29: 36  6/19: 14/80 Goal status: ongoing   ASSESSMENT:  CLINICAL IMPRESSION: Patient is fatigued throughout session requiring occasional rest breaks. Unstable surfaces continue to be challenging due to L foot instability. Has increased tremors of LLE  noted this session.   Patient will benefit from skilled physical therapy to improve strength, mobility, and stability for improved quality of life.   OBJECTIVE IMPAIRMENTS: Abnormal gait, decreased activity tolerance, decreased balance, decreased  coordination, decreased endurance, decreased mobility, difficulty walking, decreased ROM, decreased strength, hypomobility, impaired flexibility, improper body mechanics, and postural dysfunction.   ACTIVITY LIMITATIONS: carrying, lifting, bending, standing, squatting, stairs, transfers, reach over head, locomotion level, and caring for others  PARTICIPATION LIMITATIONS: meal prep, cleaning, laundry, personal finances, driving, shopping, community activity, and yard work  PERSONAL FACTORS: Age, Past/current experiences, Time since onset of injury/illness/exacerbation, and 3+ comorbidities: DHD, arthritis, concussion COVID, HTN, multiple lacunar infarcts, MVP, pernicious anemia, rocky mountain spotted fever, spinal stenosis, vestibular migraine, vitamin B deficiency.  are also affecting patient's functional outcome.   REHAB POTENTIAL: Good  CLINICAL DECISION MAKING: Evolving/moderate complexity  EVALUATION COMPLEXITY: Moderate  PLAN:   PT FREQUENCY: 1x/week  PT DURATION: 12 weeks  PLANNED INTERVENTIONS: Therapeutic exercises, Therapeutic activity, Neuromuscular re-education, Balance training, Gait training, Patient/Family education, Self Care, Joint mobilization, Stair training, Vestibular training, Canalith repositioning, Visual/preceptual remediation/compensation, Orthotic/Fit training, DME instructions, Cognitive remediation, Electrical stimulation, Spinal mobilization, Cryotherapy, Moist heat, Splintting, Taping, Traction, Ultrasound, Manual therapy, and Re-evaluation  PLAN FOR NEXT SESSION:  balance, strength, unstable surfaces    Alona Danford  Leopoldo, PT, DPT Physical Therapist - [place] [facility]  Outpatient Physical Therapy- Main Campus [phone]

## 2024-02-11 ENCOUNTER — Ambulatory Visit

## 2024-02-11 ENCOUNTER — Ambulatory Visit: Admitting: Occupational Therapy

## 2024-02-11 DIAGNOSIS — M6281 Muscle weakness (generalized): Secondary | ICD-10-CM

## 2024-02-11 DIAGNOSIS — R2681 Unsteadiness on feet: Secondary | ICD-10-CM | POA: Diagnosis not present

## 2024-02-11 DIAGNOSIS — R278 Other lack of coordination: Secondary | ICD-10-CM | POA: Diagnosis not present

## 2024-02-11 DIAGNOSIS — R262 Difficulty in walking, not elsewhere classified: Secondary | ICD-10-CM

## 2024-02-11 NOTE — Progress Notes (Unsigned)
 Referring Physician:  Melvin Pao, NP [address] [place],  [place] [address]  Primary Physician:  Melvin Pao, NP  History of Present Illness: 02/12/2024 [name] is here today with a chief complaint of ulnar neuropathy. He has a significant history of cervicaly myelpathy as well as a brainstem stroke. He has had progressive worsening and wasting of his right hand. EMG demonstrated servere ulnar neuropathy.  He states that he gets no tingling or pain in the arm but he has noticed atrophy and weakness.  There is some concern whether or not he is losing dexterity but he states that he still able to do his buttons.  He does have tremulousness throughout his body.  He does notice that the micro shakes/movements in his right upper extremity have gotten worse.   Review of Systems:  A 10 point review of systems is negative, except for the pertinent positives and negatives detailed in the HPI.  Past Medical History: Past Medical History:  Diagnosis Date   ADHD    Arthritis    Concussion    COVID 12/2020   Hypertension    Multiple lacunar infarcts (HCC)    MVP (mitral valve prolapse)    Pernicious anemia    Rocky Mountain spotted fever    Spinal stenosis    Stroke [facility])    Vestibular migraine    Vitamin B12 deficiency    Vitamin D  deficiency     Past Surgical History: Past Surgical History:  Procedure Laterality Date   ANTERIOR CERVICAL DECOMP/DISCECTOMY FUSION N/A 07/30/2022   Procedure: C4-7 ANTERIOR CERVICAL DISCECTOMY AND FUSION (GLOBUS HEDRON);  Surgeon: Clois Fret, MD;  Location: [facility] OR[facility];  Service: Neurosurgery;  Laterality: N/A;   BILATERAL CARPAL TUNNEL RELEASE     CERVICAL WOUND DEBRIDEMENT N/A 08/01/2022   Procedure: CERVICAL WOUND DEBRIDEMENT OF HEMATOMA;  Surgeon: Clois Fret, MD;  Location: [facility];  Service: Neurosurgery;  Laterality: N/A;   COLONOSCOPY     COLONOSCOPY WITH PROPOFOL  N/A 12/17/2023   Procedure: COLONOSCOPY  WITH PROPOFOL ;  Surgeon: Maryruth Ole DASEN, MD;  Location: [facility];  Service: Endoscopy;  Laterality: N/A;   ESOPHAGOGASTRODUODENOSCOPY (EGD) WITH PROPOFOL  N/A 12/17/2023   Procedure: ESOPHAGOGASTRODUODENOSCOPY (EGD) WITH PROPOFOL ;  Surgeon: Maryruth Ole DASEN, MD;  Location: [facility];  Service: Endoscopy;  Laterality: N/A;   FRACTURE SURGERY Left 1984   intramedullary rod   hip bone transplant  1984   LEG SURGERY Left    oseotomy   POLYPECTOMY  12/17/2023   Procedure: POLYPECTOMY, INTESTINE;  Surgeon: Maryruth Ole DASEN, MD;  Location: [facility];  Service: Endoscopy;;   REPAIR ANKLE LIGAMENT Right 1978   REPAIR KNEE LIGAMENT Bilateral    acl   TRIGGER FINGER RELEASE Bilateral    thumbs    Allergies: Allergies as of 02/12/2024 - Review Complete 02/12/2024  Allergen Reaction Noted   Lisinopril  Other (See Comments) 01/16/2024   Propranolol  Other (See Comments) 01/16/2024    Medications:  Current Outpatient Medications:    acetaminophen  (TYLENOL ) 500 MG tablet, Take 500 mg by mouth every 6 (six) hours as needed., Disp: , Rfl:    amLODipine  (NORVASC ) 5 MG tablet, Take 5 mg by mouth daily., Disp: , Rfl:    aspirin EC 325 MG tablet, Take 325 mg by mouth as needed (Pain)., Disp: , Rfl:    aspirin EC 81 MG tablet, Take 81 mg by mouth daily. Swallow whole., Disp: , Rfl:    Cholecalciferol  (VITAMIN D3 PO), Take 5,000 Int'l Units  by mouth daily., Disp: , Rfl:    Cyanocobalamin  (VITAMIN B 12 PO), Take 5,000 mcg by mouth daily., Disp: , Rfl:    Cyanocobalamin  1000 MCG/ML KIT, Inject into the muscle. Every three months, Disp: , Rfl:    DM-APAP-CPM (CORICIDIN HBP PO), Take by mouth as needed., Disp: , Rfl:    Docusate Sodium  (COLACE PO), Take by mouth daily. Daily, Disp: , Rfl:    hydrochlorothiazide  (MICROZIDE ) 12.5 MG capsule, Take 1 capsule (12.5 mg total) by mouth daily., Disp: 90 capsule, Rfl: 3   LORazepam  (ATIVAN ) 0.5 MG tablet, Take 1 tablet (0.5 mg total) by mouth 2  (two) times daily as needed for anxiety., Disp: 30 tablet, Rfl: 0   mirtazapine  (REMERON  SOL-TAB) 15 MG disintegrating tablet, Take 1 tablet (15 mg total) by mouth at bedtime., Disp: 90 tablet, Rfl: 0   olmesartan  (BENICAR ) 20 MG tablet, Take 1 tablet (20 mg total) by mouth daily., Disp: 30 tablet, Rfl: 3   rizatriptan  (MAXALT ) 10 MG tablet, Take 1 tablet (10 mg total) by mouth as needed for migraine. May repeat in 2 hours if needed, Disp: 10 tablet, Rfl: 0  Social History: Social History   Tobacco Use   Smoking status: Never   Smokeless tobacco: Never  Vaping Use   Vaping status: Never Used  Substance Use Topics   Alcohol use: Not Currently    Comment: 1 beer per month   Drug use: No    Family Medical History: Family History  Problem Relation Age of Onset   Breast cancer Mother    Thyroid  cancer Mother    Lymphoma Father    CVA Father    Heart disease Sister        Dysautonomia   Hypertension Sister    Heart attack Maternal Grandfather     Physical Examination: Vitals:   02/12/24 1526  BP: 116/70    General: Patient is in no apparent distress. Attention to examination is appropriate.  Neck:   Supple.  Full range of motion.  Respiratory: Patient is breathing without any difficulty.   NEUROLOGICAL:   Motor Exam: Significant wasting noted in the right greater than left intrinsic hand muscles.  On motor exam however he does have good strength approximately 4+ to 5 out of 5 in most intrinsic hand musculature.  He does have some slight clawing noted predominantly in his pinky finger.    Medical Decision Making  Electrodiagnostics: His most recent EMG is documented in the patient's chart, in summary this was a severe ongoing neuropathy at the ulnar nerve on the right with no evidence of active radiculopathy.  I have personally reviewed the images and electrodiagnostics and agree with the above interpretation.  Assessment and Plan: [name] is a pleasant 64 y.o.  male with a history of cervical myeloradiculopathy and progressive ulnar neuropathy.  He was seen in neurology recently with an EMG which demonstrated severe ulnar neuropathy, in the setting of his hand wasting he is likely experienced a double crush phenomenon as he has had a previous cervical myeloradiculopathy as well.  To further complicate matters he does have a history of a brainstem level stroke which could indicate a weakening of this pathway at 3 separate locations in the neuro axis.   He states that he is not bothered by the sensation in his ulnar hand, and that he does not feel like he is losing a significant amount of dexterity, but it is very clear that he has a significant amount of  hand wasting as well as an EMG positive for ulnar neuropathy.  I did explain to him that given his severity he would qualify for a ulnar nerve decompression plus or minus a pronator quadratus branch to ulnar nerve and to side transfer.  He would like to exhaust his conservative options before going forward with surgery.  Therefore we have discussed elbow padding and nighttime elbow bracing.  He wants to try these first and then follow-up.  Will plan on seeing him back in approximately 2 to 3 months with a repeat examination.    Thank you for involving me in the care of this patient.    Penne MICAEL Sharps MD/MSCR Neurosurgery - Peripheral Nerve Surgery

## 2024-02-11 NOTE — Therapy (Signed)
 Occupational Therapy Progress/Recertification Note  Dates of reporting period  11/07/23   to   02/11/24   Patient Name: [name] MRN: [id number] DOB:[birth year], 63 y.o., male Today's Date: 02/11/2024  PCP: Melvin Pao, NP REFERRING PROVIDER: Maree Hila, MARLA, MD  END OF SESSION:  OT End of Session - 02/11/24 1535     Visit Number 10    Number of Visits 24    Date for OT Re-Evaluation 05/05/24    OT Start Time 1530    OT Stop Time 1615    OT Time Calculation (min) 45 min    Activity Tolerance Patient tolerated treatment well    Behavior During Therapy [facility] for tasks assessed/performed           Past Medical History:  Diagnosis Date   ADHD    Arthritis    Concussion    COVID 12/2020   Hypertension    Multiple lacunar infarcts (HCC)    MVP (mitral valve prolapse)    Pernicious anemia    Rocky Mountain spotted fever    Spinal stenosis    Stroke [facility])    Vestibular migraine    Vitamin B12 deficiency    Vitamin D  deficiency    Past Surgical History:  Procedure Laterality Date   ANTERIOR CERVICAL DECOMP/DISCECTOMY FUSION N/A 07/30/2022   Procedure: C4-7 ANTERIOR CERVICAL DISCECTOMY AND FUSION (GLOBUS HEDRON);  Surgeon: Clois Fret, MD;  Location: [facility];  Service: Neurosurgery;  Laterality: N/A;   BILATERAL CARPAL TUNNEL RELEASE     CERVICAL WOUND DEBRIDEMENT N/A 08/01/2022   Procedure: CERVICAL WOUND DEBRIDEMENT OF HEMATOMA;  Surgeon: Clois Fret, MD;  Location: [facility];  Service: Neurosurgery;  Laterality: N/A;   COLONOSCOPY     COLONOSCOPY WITH PROPOFOL  N/A 12/17/2023   Procedure: COLONOSCOPY WITH PROPOFOL ;  Surgeon: Maryruth Ole DASEN, MD;  Location: [facility];  Service: Endoscopy;  Laterality: N/A;   ESOPHAGOGASTRODUODENOSCOPY (EGD) WITH PROPOFOL  N/A 12/17/2023   Procedure: ESOPHAGOGASTRODUODENOSCOPY (EGD) WITH PROPOFOL ;  Surgeon: Maryruth Ole DASEN, MD;  Location: [facility];  Service: Endoscopy;  Laterality: N/A;    FRACTURE SURGERY Left 1984   intramedullary rod   hip bone transplant  1984   LEG SURGERY Left    oseotomy   POLYPECTOMY  12/17/2023   Procedure: POLYPECTOMY, INTESTINE;  Surgeon: Maryruth Ole DASEN, MD;  Location: [facility];  Service: Endoscopy;;   REPAIR ANKLE LIGAMENT Right 1978   REPAIR KNEE LIGAMENT Bilateral    acl   TRIGGER FINGER RELEASE Bilateral    thumbs   Patient Active Problem List   Diagnosis Date Noted   Lacunar stroke (HCC) 12/12/2023   Essential tremor 12/12/2023   Dizziness 10/30/2023   Postoperative hematoma of musculoskeletal structure following musculoskeletal procedure 08/01/2022   Cervical myelopathy (HCC) 07/30/2022   Cervical spinal stenosis 07/30/2022   Radiculopathy, cervical 07/30/2022   S/P cervical spinal fusion 07/30/2022   Vitamin D  deficiency 06/25/2022   B12 deficiency 06/25/2022   Elevated LDL cholesterol level 06/25/2022   Prediabetes 03/27/2021   History of nausea- reported 01/27/2020 02/02/2020   Accidental fall from ladder 02/02/2020   Acute right-sided thoracic back pain 02/02/2020   Neck stiffness 02/02/2020   Concussion with no loss of consciousness 02/02/2020   Multiple falls 02/02/2020   Head trauma, initial encounter 02/02/2020   Acute post-traumatic headache, not intractable 02/02/2020   Essential hypertension 08/26/2018   Pre-bariatric surgery nutrition evaluation 06/19/2018   Migraine with vertigo 01/05/2014   Arthralgia of multiple joints 04/28/2009   Carpal  tunnel syndrome 08/30/2008    ONSET DATE: 07/30/2022  REFERRING DIAG: ACDF, Hx of CVAs  THERAPY DIAG:  Muscle weakness (generalized)  Other lack of coordination  Rationale for Evaluation and Treatment: Rehabilitation  SUBJECTIVE:   SUBJECTIVE STATEMENT:  Pr. reports that  his summer camp session is going well.  next scheduled visit.  Pt accompanied by: self  PERTINENT HISTORY: Pt. underwent an ACDF C4-C7. L5-S1 anterolithesis, and disc degeneration  causing marked biforminal L5 impingement, Spinal stenosis from L1-2 to L3-4 with L2-3 being the most advanced. Bilateral hand tremors. Bilateral Lacunar infarcts in 2020. See above list for PMHx .  PRECAUTIONS: None  WEIGHT BEARING RESTRICTIONS: No  PAIN:  Are you having pain? None reported  FALLS: Has patient fallen in last 6 months? Yes. Number of falls 6-10 falls  LIVING ENVIRONMENT: Lives with: lives with their spouse Lives in: House/apartment 2 levels Stairs: 4-5 steps. Has following equipment at home: Single point cane, Walker - 2 wheeled, and shower chair  PLOF: Independent  PATIENT GOALS:  To improve bilateral hand cooridnation  OBJECTIVE:  Note: Objective measures were completed at Evaluation unless otherwise noted.  HAND DOMINANCE: Right hand dominant  ADLs:  Transfers/ambulation related to ADLs: Eating: feels like hand, and mouth are not working together, drinks drip out of mouth, Difficulty using fork, and knife together for self-feeding. Grooming: Independent brushing teeth UB Dressing: Difficulty buttoning LB Dressing: Difficulty managing pants, shoes, and socks Toileting: Independent Bathing: incoordination with applying shampoo with bilateral hands Tub Shower transfers: Distant supervision   IADLs: Shopping: Delivery Light housekeeping: Difficulty with vacuuming-bending over Meal Prep:  Independent Community mobility:  Independent Medication management: Wife sets up pillbox-Pt. Takes pills independently  Financial management: No changes Handwriting: 75% legible  MOBILITY STATUS: Hx of falls  POSTURE COMMENTS:   Sitting balance: good  ACTIVITY TOLERANCE: Activity tolerance: Fair   UPPER EXTREMITY ROM:    Active ROM Right Eval WFL Left Eval WFL  (Blank rows = not tested)  UPPER EXTREMITY MMT:     MMT Right Eval  02/11/24 Left Eval  02/11/24  Shoulder flexion 4+/5 5/5 4+/5 5/5  Shoulder abduction 4+/5 4+/5 4+/5 4+/5  Shoulder  adduction      Shoulder extension      Shoulder internal rotation      Shoulder external rotation      Middle trapezius      Lower trapezius      Elbow flexion 5/5 5/5 5/5 5/5  Elbow extension 5/5 5/5 5/5 5/5  Wrist flexion      Wrist extension 5/5 5/5 5/5 5/5  Wrist ulnar deviation      Wrist radial deviation      Wrist pronation      Wrist supination      (Blank rows = not tested)  HAND FUNCTION: Grip strength: Right: 48 lbs; Left: 44 lbs, Lateral pinch: Right: 17 lbs, Left: 16 lbs, and 3 point pinch: Right: 12 lbs, Left: 19 lbs  02/11/24:   Grip strength: Right: 57 lbs; Left: 52 lbs, Lateral pinch: Right: 12 lbs, Left: 15 lbs, and 3 point pinch: Right: 10 lbs, Left: 15 lbs  COORDINATION: 9 Hole Peg test: Right: 25 sec; Left: 26 sec  9 Hole Peg test: Right: 21 sec; Left: 26 sec  SENSATION:  Pt. reports no changes  EDEMA: N/A  MUSCLE TONE: Intact;  Atrophy at the right dorsal interossei muscles.  COGNITION: Overall cognitive status: WFL for tasks performed  VISION:  Wears glasses. No  change from baseline   PERCEPTION: intact   OBSERVATIONS: Pt. Left his 2 wheeled scooter in the waiting/reception area                                                                                                                             TREATMENT DATE:  02/11/24  Therapeutic Activities:   -Lateral, and 3pt. Pinch strengthening using yellow, red, green, and blue, and black level resistive clips -Facilitated translatory movements moving clips from the lateral pinch position to the 3pt. Pinch position in preparation for securely placing them on the dowel.   PATIENT EDUCATION: Education details: OT services, POC, goals, ADL/IADL functional status. Person educated: Patient Education method: Medical illustrator Education comprehension: verbalized understanding and returned demonstration  HOME EXERCISE PROGRAM:   Hand strengthening with green  theraputtty.   GOALS: Goals reviewed with patient? Yes  SHORT TERM GOALS: Target date: 03/24/2024   Pt. Will be independent with HEPs for BUEs  Baseline: 02/11/2024: Independent with hand exercises with the theraputty. Eval: No current HEP Goal status: Ongoing  LONG TERM GOALS: Target date: 05/05/24  Pt. Will perform buttoning with modified independence Baseline: 02/11/24: Independent buttoning a shirt-depending upon button size. Pt reports having difficulty with the small cuff sleeves for which he uses a buttonhook for at home, however it is not usually readily available if he needs to button/unbutton his sleeve cuff when he is out. Eval: Buttoning is difficult Goal status: Achieved  2.  Pt. Will be able to cut food with Modified independence Baseline: 02/11/24: Pt. Reports improvement with cutting food, however still has difficulty with bilateral hand coordination. Eval: Pt. Has difficulty using bilateral hand to cut food. Goal status: Ongoing  3.  Pt. Will improve bilateral UE strength by 2 mm grades to be able to sustain bilateral UEs in elevation while coordinating bilateral hands for shampooing. Baseline: 02/11/24: Improved. BUE shoulder flexion: 5/5, abduction: 4+/5, elbow flexion, and extension 5/5, wrist extension: 5/5. Pt. Has improved with washing/shampooing hair, however reports still having difficulty with coordinating bilateral hand movements. Eval: Pt. Has difficulty coordinating UEs during shampooing. Goal status: Ongoing  4.  Pt. Will demonstrate compensatory/adaptive equipment strategies for LE dressing tasks.  Baseline: 02/11/24: Pt. reports the LE dressing has improved, however continues to have difficulty coordinating bilateral hand function. Eval: Pt. has difficulty performing LE dressing   Goal status: Ongoing  5.  Pt. Will identify 2 compensatory strategies for bilateral tremors during ADLs/IADLs Baseline: 02/11/24: Cues for compensatory strategies. Eval: Education to be  provided Goal status: Ongoing  6. Pt. Will improve bilateral pinch strength by 3# to be able to securely hold items in his  hand during ADLs/IADLs Baseline: 02/11/24: Lateral pinch: Right: 12 lbs, Left: 15 lbs, and 3 point pinch: Right: 10 lbs, Left: 15 lbs    Goal status: New ASSESSMENT:  CLINICAL IMPRESSION:  Measurements were obtained, and goals were reviewed with the Pt. Pt. has made progress with bilateral  UE strength,bilateral grip strength, and bilateral FMC/speed/dexterity. Pt. Has progressed with buttoning, cutting food, and performing LE dressing. Pt. Reports that he continues to be limited with coordinating bilateral hands to efficiently complete daily tasks. Plan to continue with POC to focus on UE strength, pinch strength, and bilateral hand coordination skills. In the meantime,  Pt. plans to talk with his wife about his functional status, goals, and POC. Pt. continues to benefit from OT services to work on improving BUE functioning, and maximize independence with ADLs, and IADL tasks.   PERFORMANCE DEFICITS: in functional skills including ADLs, IADLs, coordination, dexterity, ROM, strength, pain, Fine motor control, Gross motor control, and UE functional use, cognitive skills including , and psychosocial skills including coping strategies, environmental adaptation, and routines and behaviors.   IMPAIRMENTS: are limiting patient from ADLs, IADLs, and leisure.   CO-MORBIDITIES: may have co-morbidities  that affects occupational performance. Patient will benefit from skilled OT to address above impairments and improve overall function.  MODIFICATION OR ASSISTANCE TO COMPLETE EVALUATION: Min-Moderate modification of tasks or assist with assess necessary to complete an evaluation.  OT OCCUPATIONAL PROFILE AND HISTORY: Detailed assessment: Review of records and additional review of physical, cognitive, psychosocial history related to current functional performance.  CLINICAL DECISION  MAKING: Moderate - several treatment options, min-mod task modification necessary  REHAB POTENTIAL: Good  EVALUATION COMPLEXITY: Moderate    PLAN:  OT FREQUENCY: 2x/month  OT DURATION: 12 weeks  PLANNED INTERVENTIONS: 97535 self care/ADL training, 02889 therapeutic exercise, 97530 therapeutic activity, 97112 neuromuscular re-education, 97140 manual therapy, 97018 paraffin, 02989 moist heat, 97034 contrast bath, energy conservation, patient/family education, and DME and/or AE instructions  RECOMMENDED OTHER SERVICES: PT  CONSULTED AND AGREED WITH PLAN OF CARE: Patient  PLAN FOR NEXT SESSION: Treatment  Richardson Otter, MS, OTR/L

## 2024-02-12 ENCOUNTER — Ambulatory Visit (INDEPENDENT_AMBULATORY_CARE_PROVIDER_SITE_OTHER): Admitting: Nurse Practitioner

## 2024-02-12 ENCOUNTER — Encounter: Payer: Self-pay | Admitting: Neurosurgery

## 2024-02-12 ENCOUNTER — Ambulatory Visit: Admitting: Neurosurgery

## 2024-02-12 VITALS — BP 116/70 | Ht 67.0 in | Wt 140.0 lb

## 2024-02-12 VITALS — BP 110/66 | HR 73 | Ht 67.0 in | Wt 140.0 lb

## 2024-02-12 DIAGNOSIS — R296 Repeated falls: Secondary | ICD-10-CM

## 2024-02-12 DIAGNOSIS — G5621 Lesion of ulnar nerve, right upper limb: Secondary | ICD-10-CM

## 2024-02-12 DIAGNOSIS — R634 Abnormal weight loss: Secondary | ICD-10-CM | POA: Insufficient documentation

## 2024-02-12 MED ORDER — MIRTAZAPINE 15 MG PO TBDP: 15.0000 mg | ORAL_TABLET | Freq: Every day | ORAL | 0 refills | Status: DC

## 2024-02-12 NOTE — Assessment & Plan Note (Signed)
 Chronic.  Has worsened in recent weeks with multiple falls. Patient is having a hard time with balance.  He is no longer stable on a cane and has been using crutches that he has at home.  Would like a Rolator to assist with balance and prevent further falls. Will order for patient during visit.

## 2024-02-12 NOTE — Progress Notes (Signed)
 BP 110/66   Pulse 73   Ht 5' 7 (1.702 m)   Wt 140 lb (63.5 kg)   SpO2 98%   BMI 21.93 kg/m    Subjective:    Patient ID: [name], male    DOB: [birth year], 63 y.o.   MRN: [id number]  HPI: [name] is a 63 y.o. male  Chief Complaint  Patient presents with   Medical Management of Chronic Issues   Patient presents to clinic to discuss his weight loss and falls.  Patient is not stable on a cane.  He has been using crutches to help with the stability.  He would like to have a Rolator to help prevent falls.  Patient has fallen over backwards.  Medi[employer]- sissy  Patient also would like to discuss something to help stimulate his appetite.  He recently saw GI and it was recommended that he take something to help with his appetite.      Relevant past medical, surgical, family and social history reviewed and updated as indicated. Interim medical history since our last visit reviewed. Allergies and medications reviewed and updated.  Review of Systems  Constitutional:  Positive for unexpected weight change.  Neurological:  Positive for weakness.    Per HPI unless specifically indicated above     Objective:    BP 110/66   Pulse 73   Ht 5' 7 (1.702 m)   Wt 140 lb (63.5 kg)   SpO2 98%   BMI 21.93 kg/m   Wt Readings from Last 3 Encounters:  02/12/24 140 lb (63.5 kg)  01/20/24 141 lb (64 kg)  01/20/24 139 lb 12.8 oz (63.4 kg)    Physical Exam Vitals and nursing note reviewed.  Constitutional:      General: He is not in acute distress.    Appearance: Normal appearance. He is not ill-appearing, toxic-appearing or diaphoretic.  HENT:     Head: Normocephalic.     Right Ear: External ear normal.     Left Ear: External ear normal.     Nose: Nose normal. No congestion or rhinorrhea.     Mouth/Throat:     Mouth: Mucous membranes are moist.   Eyes:     General:        Right eye: No discharge.        Left eye: No discharge.     Extraocular Movements:  Extraocular movements intact.     Conjunctiva/sclera: Conjunctivae normal.     Pupils: Pupils are equal, round, and reactive to light.    Cardiovascular:     Rate and Rhythm: Normal rate and regular rhythm.     Heart sounds: No murmur heard. Pulmonary:     Effort: Pulmonary effort is normal. No respiratory distress.     Breath sounds: Normal breath sounds. No wheezing, rhonchi or rales.  Abdominal:     General: Abdomen is flat. Bowel sounds are normal.   Musculoskeletal:     Cervical back: Normal range of motion and neck supple.   Skin:    General: Skin is warm and dry.     Capillary Refill: Capillary refill takes less than 2 seconds.   Neurological:     General: No focal deficit present.     Mental Status: He is alert and oriented to person, place, and time.   Psychiatric:        Mood and Affect: Mood normal.        Behavior: Behavior normal.  Thought Content: Thought content normal.        Judgment: Judgment normal.     Results for orders placed or performed in visit on 02/03/24  Basic metabolic panel with GFR   Collection Time: 02/03/24  2:55 PM  Result Value Ref Range   Glucose 106 (H) 70 - 99 mg/dL   BUN 19 8 - 27 mg/dL   Creatinine, Ser 9.20 0.76 - 1.27 mg/dL   eGFR 899 >40 fO/fpw/8.26   BUN/Creatinine Ratio 24 10 - 24   Sodium 140 134 - 144 mmol/L   Potassium 4.5 3.5 - 5.2 mmol/L   Chloride 99 96 - 106 mmol/L   CO2 22 20 - 29 mmol/L   Calcium 10.0 8.6 - 10.2 mg/dL      Assessment & Plan:   Problem List Items Addressed This Visit       Other   Multiple falls - Primary   Chronic.  Has worsened in recent weeks with multiple falls. Patient is having a hard time with balance.  He is no longer stable on a cane and has been using crutches that he has at home.  Would like a Rolator to assist with balance and prevent further falls. Will order for patient during visit.       Unintentional weight loss   Chronic.  Has been an ongoing concern over the last  several months.  Patient does not each much throughout the day and struggles with an appetite.   GI recommended that patient start medication to help stimulate appetite.  Will start Mirtazipine 15mg  daily.  Discussed side effects and benefits of medication with patient during visit.   Follow up in 6 weeks.  Call sooner if concerns arise.         Follow up plan: Return in about 6 weeks (around 03/25/2024) for Weight Managment.

## 2024-02-12 NOTE — Assessment & Plan Note (Signed)
 Chronic.  Has been an ongoing concern over the last several months.  Patient does not each much throughout the day and struggles with an appetite.   GI recommended that patient start medication to help stimulate appetite.  Will start Mirtazipine 15mg  daily.  Discussed side effects and benefits of medication with patient during visit.   Follow up in 6 weeks.  Call sooner if concerns arise.

## 2024-02-13 ENCOUNTER — Encounter: Admitting: Occupational Therapy

## 2024-02-16 DIAGNOSIS — G5621 Lesion of ulnar nerve, right upper limb: Secondary | ICD-10-CM | POA: Insufficient documentation

## 2024-02-17 DIAGNOSIS — R296 Repeated falls: Secondary | ICD-10-CM | POA: Diagnosis not present

## 2024-02-20 ENCOUNTER — Ambulatory Visit: Payer: 59

## 2024-02-20 ENCOUNTER — Ambulatory Visit: Admitting: Occupational Therapy

## 2024-02-20 DIAGNOSIS — F0670 Mild neurocognitive disorder due to known physiological condition without behavioral disturbance: Secondary | ICD-10-CM | POA: Diagnosis not present

## 2024-02-20 DIAGNOSIS — R4681 Obsessive-compulsive behavior: Secondary | ICD-10-CM | POA: Diagnosis not present

## 2024-02-20 DIAGNOSIS — F840 Autistic disorder: Secondary | ICD-10-CM | POA: Diagnosis not present

## 2024-02-24 ENCOUNTER — Other Ambulatory Visit: Payer: Self-pay | Admitting: Neurosurgery

## 2024-02-24 DIAGNOSIS — M5416 Radiculopathy, lumbar region: Secondary | ICD-10-CM

## 2024-02-25 ENCOUNTER — Ambulatory Visit

## 2024-02-25 ENCOUNTER — Encounter: Payer: Self-pay | Admitting: Neurosurgery

## 2024-02-25 ENCOUNTER — Other Ambulatory Visit: Payer: Self-pay | Admitting: Physician Assistant

## 2024-02-25 ENCOUNTER — Ambulatory Visit: Admitting: Occupational Therapy

## 2024-02-25 DIAGNOSIS — M62561 Muscle wasting and atrophy, not elsewhere classified, right lower leg: Secondary | ICD-10-CM

## 2024-02-25 MED ORDER — DIAZEPAM 2 MG PO TABS: 2.0000 mg | ORAL_TABLET | Freq: Once | ORAL | 0 refills | Status: AC

## 2024-02-27 ENCOUNTER — Encounter: Admitting: Occupational Therapy

## 2024-02-27 ENCOUNTER — Encounter: Payer: Self-pay | Admitting: Physician Assistant

## 2024-02-27 ENCOUNTER — Encounter: Payer: Self-pay | Admitting: Neurology

## 2024-02-29 ENCOUNTER — Ambulatory Visit
Admission: RE | Admit: 2024-02-29 | Discharge: 2024-02-29 | Disposition: A | Discharge status: Home / Self Care | Source: Ambulatory Visit | Attending: Neurosurgery | Admitting: Neurosurgery

## 2024-02-29 ENCOUNTER — Ambulatory Visit
Admission: RE | Admit: 2024-02-29 | Discharge: 2024-02-29 | Disposition: A | Discharge status: Home / Self Care | Source: Ambulatory Visit | Attending: Physician Assistant | Admitting: Physician Assistant

## 2024-02-29 DIAGNOSIS — M47898 Other spondylosis, sacral and sacrococcygeal region: Secondary | ICD-10-CM | POA: Diagnosis not present

## 2024-02-29 DIAGNOSIS — M5416 Radiculopathy, lumbar region: Secondary | ICD-10-CM

## 2024-02-29 DIAGNOSIS — M4726 Other spondylosis with radiculopathy, lumbar region: Secondary | ICD-10-CM | POA: Diagnosis not present

## 2024-02-29 DIAGNOSIS — R29890 Loss of height: Secondary | ICD-10-CM | POA: Diagnosis not present

## 2024-02-29 DIAGNOSIS — M48061 Spinal stenosis, lumbar region without neurogenic claudication: Secondary | ICD-10-CM | POA: Diagnosis not present

## 2024-02-29 DIAGNOSIS — M62561 Muscle wasting and atrophy, not elsewhere classified, right lower leg: Secondary | ICD-10-CM

## 2024-02-29 DIAGNOSIS — M76892 Other specified enthesopathies of left lower limb, excluding foot: Secondary | ICD-10-CM | POA: Diagnosis not present

## 2024-03-02 ENCOUNTER — Telehealth: Payer: Self-pay | Admitting: Neurosurgery

## 2024-03-02 NOTE — Telephone Encounter (Signed)
 Patient's wife, Devere is calling to make sure that Dr. Clois reviews the patient's MRI's that were completed on 02/29/2024. She would like to know what the next step is. Please advise.   Reading room called for results.

## 2024-03-03 ENCOUNTER — Encounter: Payer: Self-pay | Admitting: Neurosurgery

## 2024-03-03 DIAGNOSIS — R634 Abnormal weight loss: Secondary | ICD-10-CM | POA: Diagnosis not present

## 2024-03-03 DIAGNOSIS — G629 Polyneuropathy, unspecified: Secondary | ICD-10-CM | POA: Diagnosis not present

## 2024-03-03 DIAGNOSIS — G4733 Obstructive sleep apnea (adult) (pediatric): Secondary | ICD-10-CM | POA: Diagnosis not present

## 2024-03-03 DIAGNOSIS — M5417 Radiculopathy, lumbosacral region: Secondary | ICD-10-CM | POA: Diagnosis not present

## 2024-03-03 DIAGNOSIS — R29898 Other symptoms and signs involving the musculoskeletal system: Secondary | ICD-10-CM | POA: Diagnosis not present

## 2024-03-03 DIAGNOSIS — M48061 Spinal stenosis, lumbar region without neurogenic claudication: Secondary | ICD-10-CM | POA: Diagnosis not present

## 2024-03-03 NOTE — Telephone Encounter (Signed)
 Pt's wife also sent mychart message 03/03/24. Sent Dr Elliot response via rhona.

## 2024-03-04 NOTE — Telephone Encounter (Signed)
 Appointment changed patient notified

## 2024-03-05 ENCOUNTER — Ambulatory Visit: Admitting: Occupational Therapy

## 2024-03-05 ENCOUNTER — Ambulatory Visit: Payer: 59

## 2024-03-05 DIAGNOSIS — F0670 Mild neurocognitive disorder due to known physiological condition without behavioral disturbance: Secondary | ICD-10-CM | POA: Diagnosis not present

## 2024-03-05 DIAGNOSIS — R4681 Obsessive-compulsive behavior: Secondary | ICD-10-CM | POA: Diagnosis not present

## 2024-03-05 DIAGNOSIS — F840 Autistic disorder: Secondary | ICD-10-CM | POA: Diagnosis not present

## 2024-03-09 ENCOUNTER — Telehealth: Payer: Self-pay | Admitting: Sleep Medicine

## 2024-03-09 DIAGNOSIS — G5621 Lesion of ulnar nerve, right upper limb: Secondary | ICD-10-CM | POA: Diagnosis not present

## 2024-03-09 DIAGNOSIS — M5417 Radiculopathy, lumbosacral region: Secondary | ICD-10-CM | POA: Diagnosis not present

## 2024-03-09 NOTE — Telephone Encounter (Signed)
 LVMTCB to schedule sleep consult.

## 2024-03-10 ENCOUNTER — Ambulatory Visit: Attending: Neurosurgery | Admitting: Occupational Therapy

## 2024-03-10 ENCOUNTER — Ambulatory Visit

## 2024-03-10 DIAGNOSIS — R2681 Unsteadiness on feet: Secondary | ICD-10-CM | POA: Diagnosis not present

## 2024-03-10 DIAGNOSIS — M6281 Muscle weakness (generalized): Secondary | ICD-10-CM | POA: Diagnosis not present

## 2024-03-10 DIAGNOSIS — R278 Other lack of coordination: Secondary | ICD-10-CM | POA: Diagnosis not present

## 2024-03-10 DIAGNOSIS — R262 Difficulty in walking, not elsewhere classified: Secondary | ICD-10-CM
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
## 2024-03-12 ENCOUNTER — Encounter: Admitting: Occupational Therapy

## 2024-03-16 ENCOUNTER — Ambulatory Visit: Admitting: Sleep Medicine

## 2024-03-16 DIAGNOSIS — H2513 Age-related nuclear cataract, bilateral: Secondary | ICD-10-CM | POA: Diagnosis not present

## 2024-03-16 DIAGNOSIS — H5111 Convergence insufficiency: Secondary | ICD-10-CM | POA: Diagnosis not present

## 2024-03-16 DIAGNOSIS — H40013 Open angle with borderline findings, low risk, bilateral: Secondary | ICD-10-CM | POA: Diagnosis not present

## 2024-03-17 ENCOUNTER — Encounter: Payer: Self-pay | Admitting: Sleep Medicine

## 2024-03-17 ENCOUNTER — Ambulatory Visit: Admitting: Sleep Medicine

## 2024-03-17 VITALS — BP 90/66 | HR 80 | Temp 98.7°F | Ht 67.0 in | Wt 138.8 lb

## 2024-03-17 DIAGNOSIS — I639 Cerebral infarction, unspecified: Secondary | ICD-10-CM

## 2024-03-17 DIAGNOSIS — I10 Essential (primary) hypertension: Secondary | ICD-10-CM | POA: Diagnosis not present

## 2024-03-17 DIAGNOSIS — G4733 Obstructive sleep apnea (adult) (pediatric): Secondary | ICD-10-CM

## 2024-03-17 DIAGNOSIS — Z72821 Inadequate sleep hygiene: Secondary | ICD-10-CM

## 2024-03-17 NOTE — Patient Instructions (Signed)
 Will complete in lab sleep study and follow up to review results.

## 2024-03-17 NOTE — Progress Notes (Unsigned)
 Name:[name] MRN: [id number] DOB: 12-13-1960   CHIEF COMPLAINT:  ESTABLISH CARE FOR OSA   HISTORY OF PRESENT ILLNESS:  [name] is a 63 y.o. w/ a h/o OSA, CVA, HTN, OA and MVP who presents to establish care for OSA. Reports that he was initially diagnosed with moderate OSA and CPAP therapy was recommended. Reports not tolerating CPAP during the initial setup appointment. Reports doing positional therapy to help apnea but has significant neuropathy and nerve impingement.   Per patient's wife, patient snores intermittently. Denies excessive daytime sleepiness. Reports a 20 lb weight loss over the last year. Reports nocturnal awakenings due to dreams and has difficulty falling back to sleep. Denies any significant weight changes. Denies morning headaches, RLS symptoms, dream enactment, cataplexy, hypnagogic or hypnapompic hallucinations. Denies a family history of sleep apnea. Denies drowsy driving. Drinks 3-6 cups of green tea daily, occasional alcohol use, denies tobacco or illicit drug use.   Bedtime 9:30-10 pm Sleep onset 30-60 mins Rise time 10:30 am- 12 pm   EPWORTH SLEEP SCORE 15    03/17/2024    2:00 PM  Results of the Epworth flowsheet  Sitting and reading 3  Watching TV 0  Sitting, inactive in a public place (e.g. a theatre or a meeting) 2  As a passenger in a car for an hour without a break 3  Lying down to rest in the afternoon when circumstances permit 3  Sitting and talking to someone 2  Sitting quietly after a lunch without alcohol 2  In a car, while stopped for a few minutes in traffic 0  Total score 15     PAST MEDICAL HISTORY :   has a past medical history of ADHD, Arthritis, Concussion, COVID (12/2020), Hypertension, Multiple lacunar infarcts (HCC), MVP (mitral valve prolapse), Pernicious anemia, [place] spotted fever, Spinal stenosis, Stroke (HCC), Vestibular migraine, Vitamin B12 deficiency, and Vitamin D  deficiency.  has a past  surgical history that includes Leg Surgery (Left); Fracture surgery (Left, 1984); Repair ankle ligament (Right, 1978); Repair knee ligament (Bilateral); Bilateral carpal tunnel release; hip bone transplant (1984); Trigger finger release (Bilateral); Anterior cervical decomp/discectomy fusion (N/A, 07/30/2022); Cervical wound debridement (N/A, 08/01/2022); Colonoscopy; Colonoscopy with propofol  (N/A, 12/17/2023); Esophagogastroduodenoscopy (egd) with propofol  (N/A, 12/17/2023); and Polypectomy (12/17/2023). Prior to Admission medications   Medication Sig Start Date End Date Taking? Authorizing Provider  acetaminophen  (TYLENOL ) 500 MG tablet Take 500 mg by mouth every 6 (six) hours as needed.   Yes [provider]  amLODipine  (NORVASC ) 5 MG tablet Take 5 mg by mouth daily.   Yes [provider]  aspirin EC 325 MG tablet Take 325 mg by mouth as needed (Pain).   Yes [provider]  aspirin EC 81 MG tablet Take 81 mg by mouth daily. Swallow whole.   Yes [provider]  chlorhexidine  (PERIDEX ) 0.12 % solution Use as directed 5 mLs in the mouth or throat 2 (two) times daily. FLOSS AND BRUSH TEETH. SWISH 1 CAPFUL IN MOUTH THEN EXPECTORATE. DO NOT EAT/DRINK 2-3 HOURS AFTER 03/11/24  Yes [provider]  Cholecalciferol  (VITAMIN D3 PO) Take 5,000 Int'l Units by mouth daily.   Yes [provider]  Cyanocobalamin  (VITAMIN B 12 PO) Take 5,000 mcg by mouth daily.   Yes [provider]  Cyanocobalamin  1000 MCG/ML KIT Inject into the muscle. Every three months 06/04/22  Yes [provider]  diazepam  (VALIUM ) 2 MG tablet Take 2 mg by mouth every  12 (twelve) hours as needed for anxiety or muscle spasms. 02/25/24  Yes [provider]  DM-APAP-CPM (CORICIDIN HBP PO) Take by mouth as needed.   Yes [provider]  Docusate Sodium  (COLACE PO) Take by mouth daily. Daily   Yes [provider]  hydrochlorothiazide  (MICROZIDE ) 12.5 MG  capsule Take 1 capsule (12.5 mg total) by mouth daily. 12/11/23 03/17/24 Yes End, Lonni, MD  LORazepam  (ATIVAN ) 0.5 MG tablet Take 1 tablet (0.5 mg total) by mouth 2 (two) times daily as needed for anxiety. 10/30/23  Yes Melvin Pao, NP  mirtazapine  (REMERON  SOL-TAB) 15 MG disintegrating tablet Take 1 tablet (15 mg total) by mouth at bedtime. 02/12/24  Yes Melvin Pao, NP  olmesartan  (BENICAR ) 20 MG tablet Take 1 tablet (20 mg total) by mouth daily. 01/20/24 05/19/24 Yes Wittenborn, Deborah, NP  pyridOXINE (B-6) 50 MG tablet Take 50 mg by mouth daily.   Yes [provider]  rizatriptan  (MAXALT ) 10 MG tablet Take 1 tablet (10 mg total) by mouth as needed for migraine. May repeat in 2 hours if needed 08/02/21  Yes Melvin Pao, NP   Allergies  Allergen Reactions   Lisinopril  Other (See Comments)    Side effects   Propranolol  Other (See Comments)    Side effects    FAMILY HISTORY:  family history includes Breast cancer in his mother; CVA in his father; Heart attack in his maternal grandfather; Heart disease in his sister; Hypertension in his sister; Lymphoma in his father; Thyroid  cancer in his mother. SOCIAL HISTORY:  reports that he has never smoked. He has never used smokeless tobacco. He reports that he does not currently use alcohol. He reports that he does not use drugs.   Review of Systems:  Gen:  Denies  fever, sweats, chills weight loss  HEENT: Denies blurred vision, double vision, ear pain, eye pain, hearing loss, nose bleeds, sore throat Cardiac:  No dizziness, chest pain or heaviness, chest tightness,edema, No JVD Resp:   No cough, -sputum production, -shortness of breath,-wheezing, -hemoptysis,  Gi: Denies swallowing difficulty, stomach pain, nausea or vomiting, diarrhea, constipation, bowel incontinence Gu:  Denies bladder incontinence, burning urine Ext:   Denies Joint pain, stiffness or swelling Skin: Denies  skin rash, easy bruising or bleeding or  hives Endoc:  Denies polyuria, polydipsia , polyphagia or weight change Psych:   Denies depression, insomnia or hallucinations  Other:  All other systems negative  VITAL SIGNS: BP 90/66 (BP Location: Right Arm, Patient Position: Sitting, Cuff Size: Normal)   Pulse 80   Temp 98.7 F (37.1 C) (Oral)   Ht 5' 7 (1.702 m)   Wt 138 lb 12.8 oz (63 kg)   SpO2 97%   BMI 21.74 kg/m     Physical Examination:   General Appearance: No distress  EYES PERRLA, EOM intact.   NECK Supple, No JVD Pulmonary: normal breath sounds, No wheezing.  CardiovascularNormal S1,S2.  No m/r/g.   Abdomen: Benign, Soft, non-tender. Skin:   warm, no rashes, no ecchymosis  Extremities: normal, no cyanosis, clubbing. Neuro:without focal findings,  speech normal  PSYCHIATRIC: Mood, affect within normal limits.   ASSESSMENT AND PLAN  OSA I suspect that OSA is likely present due to clinical presentation. Discussed the consequences of untreated sleep apnea. Advised not to drive drowsy for safety of patient and others. Will complete further evaluation with in lab PSG and follow up to review results.    HTN Stable, on current management. Following with PCP.   Inadequate  sleep hygiene  Counseled patient on improving sleep hygiene practices. Also advised to limit total time in bed to 8-9 hours per night.   CVA Stable, following with neurology.    MEDICATION ADJUSTMENTS/LABS AND TESTS ORDERED: Recommend Sleep Study   Patient  satisfied with Plan of action and management. All questions answered  Follow up to review in lab PSG results and treatment plan.   I spent a total of minutes reviewing chart data, face-to-face evaluation with the patient, counseling and coordination of care as detailed above.    Cheyrl Buley, M.D.  Sleep Medicine [facility]

## 2024-03-18 NOTE — Therapy (Signed)
 OUTPATIENT PHYSICAL THERAPY NEURO TREATMENT   Patient Name: [name] MRN: [id number] DOB:02-Aug-1961, 63 y.o., male Today's Date: 03/19/2024   PCP: Melvin Pao NP REFERRING PROVIDER: Gregory Edsel Ruth PA  END OF SESSION:  PT End of Session - 03/19/24 1401     Visit Number 43    Number of Visits 50    Date for PT Re-Evaluation 04/09/24    Authorization Type eval 2/21    PT Start Time 1400    PT Stop Time 1443    PT Time Calculation (min) 43 min    Equipment Utilized During Treatment Gait belt    Activity Tolerance Patient tolerated treatment well    Behavior During Therapy [facility] for tasks assessed/performed               Past Medical History:  Diagnosis Date   ADHD    Arthritis    Concussion    COVID 12/2020   Hypertension    Multiple lacunar infarcts (HCC)    MVP (mitral valve prolapse)    Pernicious anemia    Rocky [place] spotted fever    Spinal stenosis    Stroke [facility])    Vestibular migraine    Vitamin B12 deficiency    Vitamin D  deficiency    Past Surgical History:  Procedure Laterality Date   ANTERIOR CERVICAL DECOMP/DISCECTOMY FUSION N/A 07/30/2022   Procedure: C4-7 ANTERIOR CERVICAL DISCECTOMY AND FUSION (GLOBUS HEDRON);  Surgeon: Clois Fret, MD;  Location: [facility] ORS;  Service: Neurosurgery;  Laterality: N/A;   BILATERAL CARPAL TUNNEL RELEASE     CERVICAL WOUND DEBRIDEMENT N/A 08/01/2022   Procedure: CERVICAL WOUND DEBRIDEMENT OF HEMATOMA;  Surgeon: Clois Fret, MD;  Location: [facility] ORS;  Service: Neurosurgery;  Laterality: N/A;   COLONOSCOPY     COLONOSCOPY WITH PROPOFOL  N/A 12/17/2023   Procedure: COLONOSCOPY WITH PROPOFOL ;  Surgeon: Maryruth Ole DASEN, MD;  Location: [facility] ENDOSCOPY;  Service: Endoscopy;  Laterality: N/A;   ESOPHAGOGASTRODUODENOSCOPY (EGD) WITH PROPOFOL  N/A 12/17/2023   Procedure: ESOPHAGOGASTRODUODENOSCOPY (EGD) WITH PROPOFOL ;  Surgeon: Maryruth Ole DASEN, MD;  Location: [facility] ENDOSCOPY;  Service:  Endoscopy;  Laterality: N/A;   FRACTURE SURGERY Left 1984   intramedullary rod   hip bone transplant  1984   LEG SURGERY Left    oseotomy   POLYPECTOMY  12/17/2023   Procedure: POLYPECTOMY, INTESTINE;  Surgeon: Maryruth Ole DASEN, MD;  Location: [facility] ENDOSCOPY;  Service: Endoscopy;;   REPAIR ANKLE LIGAMENT Right 1978   REPAIR KNEE LIGAMENT Bilateral    acl   TRIGGER FINGER RELEASE Bilateral    thumbs   Patient Active Problem List   Diagnosis Date Noted   Cubital tunnel syndrome on right 02/16/2024   Unintentional weight loss 02/12/2024   Lacunar stroke (HCC) 12/12/2023   Essential tremor 12/12/2023   Dizziness 10/30/2023   Postoperative hematoma of musculoskeletal structure following musculoskeletal procedure 08/01/2022   Cervical myelopathy (HCC) 07/30/2022   Cervical spinal stenosis 07/30/2022   Radiculopathy, cervical 07/30/2022   S/P cervical spinal fusion 07/30/2022   Vitamin D  deficiency 06/25/2022   B12 deficiency 06/25/2022   Elevated LDL cholesterol level 06/25/2022   Prediabetes 03/27/2021   History of nausea- reported 01/27/2020 02/02/2020   Accidental fall from ladder 02/02/2020   Acute right-sided thoracic back pain 02/02/2020   Neck stiffness 02/02/2020   Concussion with no loss of consciousness 02/02/2020   Multiple falls 02/02/2020   Head trauma, initial encounter 02/02/2020   Acute post-traumatic headache, not intractable 02/02/2020   Essential hypertension 08/26/2018  Pre-bariatric surgery nutrition evaluation 06/19/2018   Migraine with vertigo 01/05/2014   Arthralgia of multiple joints 04/28/2009   Carpal tunnel syndrome 08/30/2008    ONSET DATE: summer 2022  REFERRING DIAG: LE weakness and gait instability   THERAPY DIAG:  Muscle weakness (generalized)  Difficulty in walking, not elsewhere classified  Unsteadiness on feet  Other lack of coordination  Rationale for Evaluation and Treatment: Rehabilitation  SUBJECTIVE:                                                                                                                                                                                              SUBJECTIVE STATEMENT:   Patient arrives with use of RW and does not like it. Feels like he is hutched over  Patient had new study: Conclusion: This is an abnormal study.  1. There is electrodiagnostic evidence of chronic, severe right L5-S1 radiculopathy with ongoing active denervation. A left L5 radiculopathy is also likely but was not fully assessed on today's testing. Consider lumbar spine imaging if not already performed. 2. There is electrodiagnostic and sonographic evidence of a chronic, severe right ulnar neuropathy at the elbow with ongoing active denervation.  3. There is no electrodiagnostic evidence for large fiber polyneuropathy.   Pt accompanied by: self  PERTINENT HISTORY:  Patient presents for LE weakness and gait instability. He is s/p ACDF C-7 on 07/30/22. Patient has a leg length discrepancy and limited L knee flexion since his 20's. Patient has a lot of atrophy and lower back pain, foot drop bilaterally.  First symptoms was stumbling up stairs, with LLE. Foot drop was improved after ACDF. Now has numbness and tingling at the end of the day. Does have a few lesions in his pons per wife, on blood pressure medication now. PMH includes ADHD, arthritis, concussion COVID, HTN, multiple lacunar infarcts, MVP, pernicious anemia, rocky mountain spotted fever, spinal stenosis, vestibular migraine, vitamin B deficiency.   Patient had new study: Conclusion: This is an abnormal study.  1. There is electrodiagnostic evidence of chronic, severe right L5-S1 radiculopathy with ongoing active denervation. A left L5 radiculopathy is also likely but was not fully assessed on today's testing. Consider lumbar spine imaging if not already performed. 2. There is electrodiagnostic and sonographic evidence of a chronic, severe right ulnar  neuropathy at the elbow with ongoing active denervation.  3. There is no electrodiagnostic evidence for large fiber polyneuropathy.   PAIN:  Are you having pain? Denies pain   PRECAUTIONS: Other: cervical; no lifting more than 20 lb   WEIGHT BEARING RESTRICTIONS: No  FALLS: Has patient fallen in last 6  months? Yes. Number of falls multiple a week  LIVING ENVIRONMENT: Lives with: lives with their family Lives in: House/apartment Stairs: Yes: Internal: flight steps; is sleeping downstairs Has following equipment at home: Single point cane  PLOF: Independent, uses a walking stick  PATIENT GOALS: gait control of feet, strengthen legs, balance, atrophy in hand  OBJECTIVE:   DIAGNOSTIC FINDINGS:  09/11/22: No acute fracture. No spondylolisthesis. No compression deformities. Disc space narrowing C3-4 with marginal osteophytes consistent with degenerative disc disease. Postop changes ACDF C4-C7 with anatomic alignment. Normal prevertebral and cervicocranial soft tissues.   06/19/22: 1. L5 chronic pars defects with L5-S1 anterolisthesis and advanced disc degeneration causing marked biforaminal L5 impingement. 2. Degeneration and short pedicles causes compressive spinal stenosis from L1-2 to L3-4, most advanced at L2-3. Moderate bilateral foraminal narrowing at the same levels.   COGNITION: Overall cognitive status: Within functional limits for tasks assessed    COORDINATION: Limited LLE  MUSCLE LENGTH: Significant limitation L hamstring; 60 degree flexion deficit.   POSTURE: weight shift to the R  LOWER EXTREMITY ROM:     L knee flexion contraction -60 contraction LOWER EXTREMITY MMT:    MMT Right Eval R 5/15 R 8/22 Left Eval L 5/15 L 8/22  Hip flexion 4 5 5 4  4+ 4  Hip extension        Hip abduction 4- 4 4 4- 4 4  Hip adduction 4- 4 4 4- 4- 4  Hip internal rotation        Hip external rotation        Knee flexion 4- 4 4 3  4- 4-  Knee extension 4 4+ 5 4 4  4-   Ankle dorsiflexion 3 3 3+ 2+ 2+ 2+  Ankle plantarflexion 3 3+ 4- 3 3+ 3+  Ankle inversion        Ankle eversion        (Blank rows = not tested)  01/02/23 Right 8/22: RECERT  10/17 PN Left 8/22: Recert LUE 10/17: PN   Shoulder Flexion 4 4+ 5 4- 4+ 4+  Shoulder Abduction 4- 4 4+ 4- 4 4+  Shoulder Extension 4- 4+ 4 4- 4   Shoulder adduction 4- 4  3+ 4 4+  Bicep  4 4+ 5 4 4+ 4  Triceps 4- 4- 5 3+ 4 4     Right Left  Hip flexion 12.2 10.2  Hip Abduction 7.4 7.0  Hip Adduction 10.8 10.5  Knee Extension  13.8 13.4  Knee Flexion 13.4 13.2  DF    PF       BED MOBILITY:  Perform next session  TRANSFERS: Assistive device utilized: Single point cane  Sit to stand: CGA Stand to sit: CGA Chair to chair: CGA   GAIT: Gait pattern: L knee limited extension, L foot drop, L foot eversion Distance walked: 1200 Assistive device utilized: Single point cane Level of assistance: CGA Comments: Patient has increased eversion and rolling of L foot, LLE leg length discrepancy.   FUNCTIONAL TESTS:  5 times sit to stand: 5.6 seconds with minor use of LLE no UE support Timed up and go (TUG): n/a 6 minute walk test: 1200 ft 10 meter walk test: 7 seconds BERG: 36/56   PATIENT SURVEYS:  FOTO 54  TODAY'S TREATMENT:  DATE: 03/19/24 Unless otherwise stated, CGA was provided and gait belt donned in order to ensure pt safety    TherAct:  Ambulate 150 ft with [employer] Nickia Boesen: improved posture and mobility.  -education on front wheel Kiven Vangilder, proper walking technique and mobility  Marching into RTB around knees : no UE support 15x each LE   Sidestepping with RTB around knees 6' x 4 laps down/back  TherEx: Seated dynadisc: df/pf 15x Seated: dynadisc: eversion/inversion 15x  Seated step over orange hurdle 15 x each  Adduction ball squeeze 10x Adduction squeeze  with heel raise 10x.  Seated LAQ 2 x 10 with 3 second hold Seated hip abduction with RTB around knees 2 x 15      Neuro Re-ed:   Standing with CGA next to support surface:  Airex pad: static stand 30 seconds x 2 trials, noticeable trembling of ankles/LE's with fatigue and challenge to maintain stability Airex pad: horizontal head turns  scanning room 10x ; cueing for arc of motion  Airex pad: vertical head turns 10x; upward gaze increasing demand on ankle righting reaction musculature  Airex pad with marching x 10 each LE  Airex pad: one foot on 6 step one foot on airex pad, hold position for 30 seconds, switch legs, 2x each LE;  Airex balance beam: -lateral step 4x length of // bars     PATIENT EDUCATION: Education details: goals, POC, educated on safety during updated exercises  Person educated: Patient and Spouse Education method: Explanation, Demonstration, Tactile cues, and Verbal cues Education comprehension: verbalized understanding, returned demonstration, verbal cues required, and tactile cues required  HOME EXERCISE PROGRAM: Access Code: [email or web address] URL: [email or web address] Date: 10/11/2022 Prepared by: Marina  Moser  Exercises - Seated Ankle Alphabet  - 1 x daily - 7 x weekly - 2 sets - 10 reps - 5 hold - Seated Heel Toe Raises  - 1 x daily - 7 x weekly - 2 sets - 10 reps - 5 hold - Standing March with Counter Support  - 1 x daily - 7 x weekly - 2 sets - 10 reps - 5 hold  Access Code: [email or web address] URL: [email or web address] Date: 10/30/2022 Prepared by: Marina  Moser  Exercises - Seated Ankle Plantarflexion with Resistance  - 1 x daily - 7 x weekly - 2 sets - 10 reps - 5 hold - Seated Ankle Eversion with Resistance  - 1 x daily - 7 x weekly - 2 sets - 10 reps - 5 hold  Access Code: [email or web address] URL: [email or web address] Date: 11/14/2022 Prepared by: Marina  Moser  Exercises - Seated Heel Toe Raises  - 1 x daily - 7 x  weekly - 2 sets - 10 reps - 5 hold - Seated Hip Internal Rotation AROM  - 1 x daily - 7 x weekly - 2 sets - 10 reps - 5 hold - Bilateral Long Arc Quad  - 1 x daily - 7 x weekly - 2 sets - 10 reps - 5 hold - Foam Balance Tandem stance  - 1 x daily - 7 x weekly - 2 sets - 2 reps - 30 hold - Single Leg Balance on Foam  - 1 x daily - 7 x weekly - 2 sets - 30 reps - 2 hold - Seated Hip Flexion March with Ankle Weights  - 1 x daily - 7 x weekly - 2 sets - 10 reps - 5 hold - Standing Hip Abduction with Ankle Weight  - 1 x daily - 7 x weekly - 2  sets - 10 reps - 5 hold - Standing Hip Extension with Ankle Weight  - 1 x daily - 7 x weekly - 2 sets - 10 reps - 5 hold - Standing Hip Flexion with Ankle Weight  - 1 x daily - 7 x weekly - 2 sets - 10 reps - 5 hold - Seated Long Arc Quad with Ankle Weight  - 1 x daily - 7 x weekly - 2 sets - 10 reps - 5 hold Access Code: [email or web address] URL: [email or web address] Date: 08/22/2023 Prepared by: Marina  Moser  Exercises - Seated Finger Tip Pinch with Putty  - 1 x daily - 7 x weekly - 2 sets - 10 reps - 5 hold - Hand Squeezes  - 1 x daily - 7 x weekly - 2 sets - 10 reps - 5 hold - Key Pinch with Putty  - 1 x daily - 7 x weekly - 2 sets - 10 reps - 5 hold  GOALS: Goals reviewed with patient? Yes  SHORT TERM GOALS: Target date: 05/09/2023   Patient will be independent in home exercise program to improve strength/mobility for better functional independence with ADLs. Baseline: 2/21: HEP give next session 5/15 : intermittent compliance 8/22: intermittent compliance due to illness 11/14: compliant  Goal status: MET    LONG TERM GOALS: Target date: 04/09/24    Patient will increase FOTO score to equal to or greater than  64%   to demonstrate statistically significant improvement in mobility and quality of life.  Baseline: 2/21: 54% 8/22: 52% 10/17: 57% 11/14: 53% 1/16: 47% Goal status: terminated  2.  Patient will increase Berg Balance score by > 6  points (42/56)to demonstrate decreased fall risk during functional activities. Baseline: 2/21: 36/56 5/15: 45   Goal status: MET  3.  Patient will increase six minute walk test distance to >1500 for progression to age norm community ambulator and improve gait ability Baseline: 2/21: 1200 ft with [facility] 5/15: 1300 ft with [facility]  8/22: 905 ft with [facility] 10/17: 1100 ft with [facility] 11/14: 1110 ft with [facility] 1/16: 1210 ft with [facility] 3/6: 1100 ft with two canes 5/29: 1195 ft with [facility] 6/19: defer due to patient bringing crutches in today Goal status: In progress  4.  Patient will increase BLE gross strength to 4+/5  or within 1 lb of each LE as to improve functional strength for independent gait, increased standing tolerance and increased ADL ability. Baseline: 2/21 :see above 5/15: see above 10/17: RLE 5/5 LLE 4/5 L ankle 3/5  11/14: RLE 5/5 LLE 4+/5 hamstring 4/5, ankle: 3+/5 1/16: RLE 5/5 LLE 4+/5 ankle 4-/5 3/6: see above 5/29: RLE 5/5 ankle 4-/5 LLE 4+/5 ankle 4-/5 6/19: L df 3-, pf 3; R df 3+, pf 4- Goal status:  In progress / modified  6.  Patient will increase Berg Balance score by > 6 points (51/56) to demonstrate decreased fall risk during functional activities. Baseline:  5/15: 45 8/22: 41/56 10/17: 28 11/14: 36 1/16: 36 3/6: 25 5/29: 34/56  6/19: 39/56  Goal status: Ongoing  7.   Patient will increase UE strength to >4+/5 for ADLs, AD usage, and quality of life.  Baseline: 5/15: see above 8/22: see above 11/14: >4+/5 bilaterally  Goal status: MET  8.   Patient will increase lower extremity functional scale to >60/80 to demonstrate improved functional mobility and increased tolerance with ADLs.  Baseline: 11/14: 29%  1/16: 29%  3/6: 19%  5/29: 36  6/19: 14/80 Goal  status: ongoing   ASSESSMENT:  CLINICAL IMPRESSION:    Patient utilizing RW this date with proper adjustment made by therapist to encourage upright posture. Session focused on BLE strengthening and static/dynamic balance. Tolerated  session well. Patient will benefit from skilled physical therapy to improve strength, mobility, and stability for improved quality of life.   OBJECTIVE IMPAIRMENTS: Abnormal gait, decreased activity tolerance, decreased balance, decreased coordination, decreased endurance, decreased mobility, difficulty walking, decreased ROM, decreased strength, hypomobility, impaired flexibility, improper body mechanics, and postural dysfunction.   ACTIVITY LIMITATIONS: carrying, lifting, bending, standing, squatting, stairs, transfers, reach over head, locomotion level, and caring for others  PARTICIPATION LIMITATIONS: meal prep, cleaning, laundry, personal finances, driving, shopping, community activity, and yard work  PERSONAL FACTORS: Age, Past/current experiences, Time since onset of injury/illness/exacerbation, and 3+ comorbidities: DHD, arthritis, concussion COVID, HTN, multiple lacunar infarcts, MVP, pernicious anemia, rocky mountain spotted fever, spinal stenosis, vestibular migraine, vitamin B deficiency.  are also affecting patient's functional outcome.   REHAB POTENTIAL: Good  CLINICAL DECISION MAKING: Evolving/moderate complexity  EVALUATION COMPLEXITY: Moderate  PLAN:   PT FREQUENCY: 1x/week  PT DURATION: 12 weeks  PLANNED INTERVENTIONS: Therapeutic exercises, Therapeutic activity, Neuromuscular re-education, Balance training, Gait training, Patient/Family education, Self Care, Joint mobilization, Stair training, Vestibular training, Canalith repositioning, Visual/preceptual remediation/compensation, Orthotic/Fit training, DME instructions, Cognitive remediation, Electrical stimulation, Spinal mobilization, Cryotherapy, Moist heat, Splintting, Taping, Traction, Ultrasound, Manual therapy, and Re-evaluation  PLAN FOR NEXT SESSION:  balance, strength, unstable surfaces  Maryanne Finder, PT, DPT Physical Therapist - [place]  [facility]

## 2024-03-19 ENCOUNTER — Ambulatory Visit: Payer: 59

## 2024-03-19 ENCOUNTER — Ambulatory Visit: Admitting: Occupational Therapy

## 2024-03-19 DIAGNOSIS — M6281 Muscle weakness (generalized): Secondary | ICD-10-CM

## 2024-03-19 DIAGNOSIS — R262 Difficulty in walking, not elsewhere classified: Secondary | ICD-10-CM

## 2024-03-19 DIAGNOSIS — R2681 Unsteadiness on feet: Secondary | ICD-10-CM

## 2024-03-19 DIAGNOSIS — R278 Other lack of coordination: Secondary | ICD-10-CM

## 2024-03-19 DIAGNOSIS — F0670 Mild neurocognitive disorder due to known physiological condition without behavioral disturbance: Secondary | ICD-10-CM | POA: Diagnosis not present

## 2024-03-19 DIAGNOSIS — F840 Autistic disorder: Secondary | ICD-10-CM | POA: Diagnosis not present

## 2024-03-19 DIAGNOSIS — R4681 Obsessive-compulsive behavior: Secondary | ICD-10-CM | POA: Diagnosis not present

## 2024-03-19 NOTE — Therapy (Signed)
 Occupational Therapy Neuro Treatment Note  Patient Name: [name] MRN: [id number] DOB:1961-01-29, 63 y.o., male Today's Date: 03/19/2024  PCP: Melvin Pao, NP REFERRING PROVIDER: Maree Hila, MARLA, MD  END OF SESSION:  OT End of Session - 03/19/24 1722     Visit Number 12    Number of Visits 24    Date for OT Re-Evaluation 05/05/24    OT Start Time 1449    OT Stop Time 1530    OT Time Calculation (min) 41 min    Activity Tolerance Patient tolerated treatment well    Behavior During Therapy [facility] for tasks assessed/performed           Past Medical History:  Diagnosis Date   ADHD    Arthritis    Concussion    COVID 12/2020   Hypertension    Multiple lacunar infarcts (HCC)    MVP (mitral valve prolapse)    Pernicious anemia    [place] spotted fever    Spinal stenosis    Stroke [facility])    Vestibular migraine    Vitamin B12 deficiency    Vitamin D  deficiency    Past Surgical History:  Procedure Laterality Date   ANTERIOR CERVICAL DECOMP/DISCECTOMY FUSION N/A 07/30/2022   Procedure: C4-7 ANTERIOR CERVICAL DISCECTOMY AND FUSION (GLOBUS HEDRON);  Surgeon: Clois Fret, MD;  Location: [facility] ORS;  Service: Neurosurgery;  Laterality: N/A;   BILATERAL CARPAL TUNNEL RELEASE     CERVICAL WOUND DEBRIDEMENT N/A 08/01/2022   Procedure: CERVICAL WOUND DEBRIDEMENT OF HEMATOMA;  Surgeon: Clois Fret, MD;  Location: [facility] ORS;  Service: Neurosurgery;  Laterality: N/A;   COLONOSCOPY     COLONOSCOPY WITH PROPOFOL  N/A 12/17/2023   Procedure: COLONOSCOPY WITH PROPOFOL ;  Surgeon: Maryruth Ole DASEN, MD;  Location: [facility] ENDOSCOPY;  Service: Endoscopy;  Laterality: N/A;   ESOPHAGOGASTRODUODENOSCOPY (EGD) WITH PROPOFOL  N/A 12/17/2023   Procedure: ESOPHAGOGASTRODUODENOSCOPY (EGD) WITH PROPOFOL ;  Surgeon: Maryruth Ole DASEN, MD;  Location: [facility] ENDOSCOPY;  Service: Endoscopy;  Laterality: N/A;   FRACTURE SURGERY Left 1984   intramedullary rod   hip bone  transplant  1984   LEG SURGERY Left    oseotomy   POLYPECTOMY  12/17/2023   Procedure: POLYPECTOMY, INTESTINE;  Surgeon: Maryruth Ole DASEN, MD;  Location: [facility] ENDOSCOPY;  Service: Endoscopy;;   REPAIR ANKLE LIGAMENT Right 1978   REPAIR KNEE LIGAMENT Bilateral    acl   TRIGGER FINGER RELEASE Bilateral    thumbs   Patient Active Problem List   Diagnosis Date Noted   Cubital tunnel syndrome on right 02/16/2024   Unintentional weight loss 02/12/2024   Lacunar stroke (HCC) 12/12/2023   Essential tremor 12/12/2023   Dizziness 10/30/2023   Postoperative hematoma of musculoskeletal structure following musculoskeletal procedure 08/01/2022   Cervical myelopathy (HCC) 07/30/2022   Cervical spinal stenosis 07/30/2022   Radiculopathy, cervical 07/30/2022   S/P cervical spinal fusion 07/30/2022   Vitamin D  deficiency 06/25/2022   B12 deficiency 06/25/2022   Elevated LDL cholesterol level 06/25/2022   Prediabetes 03/27/2021   History of nausea- reported 01/27/2020 02/02/2020   Accidental fall from ladder 02/02/2020   Acute right-sided thoracic back pain 02/02/2020   Neck stiffness 02/02/2020   Concussion with no loss of consciousness 02/02/2020   Multiple falls 02/02/2020   Head trauma, initial encounter 02/02/2020   Acute post-traumatic headache, not intractable 02/02/2020   Essential hypertension 08/26/2018   Pre-bariatric surgery nutrition evaluation 06/19/2018   Migraine with vertigo 01/05/2014   Arthralgia of multiple joints 04/28/2009  Carpal tunnel syndrome 08/30/2008    ONSET DATE: 07/30/2022  REFERRING DIAG: ACDF, Hx of CVAs  THERAPY DIAG:  Other lack of coordination  Rationale for Evaluation and Treatment: Rehabilitation  SUBJECTIVE:   SUBJECTIVE STATEMENT: Pt. Arrived using a walker today. Pt. Has a walker attachment to carry the cane. next scheduled visit.  Pt accompanied by: self  PERTINENT HISTORY: Pt. underwent an ACDF C4-C7. L5-S1 anterolithesis, and disc  degeneration causing marked biforminal L5 impingement, Spinal stenosis from L1-2 to L3-4 with L2-3 being the most advanced. Bilateral hand tremors. Bilateral Lacunar infarcts in 2020. See above list for PMHx .  PRECAUTIONS: None  WEIGHT BEARING RESTRICTIONS: No  PAIN:  Are you having pain? 1/10 in the shoulders/neck  FALLS: Has patient fallen in last 6 months? Yes. Number of falls 6-10 falls  LIVING ENVIRONMENT: Lives with: lives with their spouse Lives in: House/apartment 2 levels Stairs: 4-5 steps. Has following equipment at home: Single point cane, Walker - 2 wheeled, and shower chair  PLOF: Independent  PATIENT GOALS:  To improve bilateral hand cooridnation  OBJECTIVE:  Note: Objective measures were completed at Evaluation unless otherwise noted.  HAND DOMINANCE: Right hand dominant  ADLs:  Transfers/ambulation related to ADLs: Eating: feels like hand, and mouth are not working together, drinks drip out of mouth, Difficulty using fork, and knife together for self-feeding. Grooming: Independent brushing teeth UB Dressing: Difficulty buttoning LB Dressing: Difficulty managing pants, shoes, and socks Toileting: Independent Bathing: incoordination with applying shampoo with bilateral hands Tub Shower transfers: Distant supervision   IADLs: Shopping: Delivery Light housekeeping: Difficulty with vacuuming-bending over Meal Prep:  Independent Community mobility:  Independent Medication management: Wife sets up pillbox-Pt. Takes pills independently  Financial management: No changes Handwriting: 75% legible  MOBILITY STATUS: Hx of falls  POSTURE COMMENTS:   Sitting balance: good  ACTIVITY TOLERANCE: Activity tolerance: Fair   UPPER EXTREMITY ROM:    Active ROM Right Eval WFL Left Eval WFL  (Blank rows = not tested)  UPPER EXTREMITY MMT:     MMT Right Eval  02/11/24 Left Eval  02/11/24  Shoulder flexion 4+/5 5/5 4+/5 5/5  Shoulder abduction 4+/5 4+/5  4+/5 4+/5  Shoulder adduction      Shoulder extension      Shoulder internal rotation      Shoulder external rotation      Middle trapezius      Lower trapezius      Elbow flexion 5/5 5/5 5/5 5/5  Elbow extension 5/5 5/5 5/5 5/5  Wrist flexion      Wrist extension 5/5 5/5 5/5 5/5  Wrist ulnar deviation      Wrist radial deviation      Wrist pronation      Wrist supination      (Blank rows = not tested)  HAND FUNCTION: Grip strength: Right: 48 lbs; Left: 44 lbs, Lateral pinch: Right: 17 lbs, Left: 16 lbs, and 3 point pinch: Right: 12 lbs, Left: 19 lbs  02/11/24:   Grip strength: Right: 57 lbs; Left: 52 lbs, Lateral pinch: Right: 12 lbs, Left: 15 lbs, and 3 point pinch: Right: 10 lbs, Left: 15 lbs  COORDINATION: 9 Hole Peg test: Right: 25 sec; Left: 26 sec  9 Hole Peg test: Right: 21 sec; Left: 26 sec  SENSATION:  Pt. reports no changes  EDEMA: N/A  MUSCLE TONE: Intact;  Atrophy at the right dorsal interossei muscles.  COGNITION: Overall cognitive status: WFL for tasks performed  VISION:  Wears glasses.  No change from baseline   PERCEPTION: intact   OBSERVATIONS: Pt. Left his 2 wheeled scooter in the waiting/reception area                                                                                                                             TREATMENT DATE:  03/19/24  Therapeutic Ex.:   -UE strengthening through reciprocal motion using the Sci[employer] for 8 min. on level 4.0 in both forward, and reverse directions with constant monitoring of the BUEs. UE strengthening was performed to increase strength needed for ADLs, and IADLs.     Neuromuscular re-ed:  -facilitated Bilateral hand coordination skills flipping 60 [place]  discs placed on a grid at the tabletop. Pt. Worked on simultaneously flipping the discs within his hands, as well as alternating between the right and left in the same direction, then challenged in opposite directions whiel crossing midline.     PATIENT EDUCATION: Education details:  [facility] skills, positioning, and dual tasking. Person educated: Patient Education method: Medical illustrator Education comprehension: verbalized understanding and returned demonstration  HOME EXERCISE PROGRAM:   Hand strengthening with green theraputtty.   GOALS: Goals reviewed with patient? Yes  SHORT TERM GOALS: Target date: 03/24/2024   Pt. Will be independent with HEPs for BUEs  Baseline: 02/11/2024: Independent with hand exercises with the theraputty. Eval: No current HEP Goal status: Ongoing  LONG TERM GOALS: Target date: 05/05/24  Pt. Will perform buttoning with modified independence Baseline: 02/11/24: Independent buttoning a shirt-depending upon button size. Pt reports having difficulty with the small cuff sleeves for which he uses a buttonhook for at home, however it is not usually readily available if he needs to button/unbutton his sleeve cuff when he is out. Eval: Buttoning is difficult Goal status: Achieved  2.  Pt. Will be able to cut food with Modified independence Baseline: 02/11/24: Pt. Reports improvement with cutting food, however still has difficulty with bilateral hand coordination. Eval: Pt. Has difficulty using bilateral hand to cut food. Goal status: Ongoing  3.  Pt. Will improve bilateral UE strength by 2 mm grades to be able to sustain bilateral UEs in elevation while coordinating bilateral hands for shampooing. Baseline: 02/11/24: Improved. BUE shoulder flexion: 5/5, abduction: 4+/5, elbow flexion, and extension 5/5, wrist extension: 5/5. Pt. Has improved with washing/shampooing hair, however reports still having difficulty with coordinating bilateral hand movements. Eval: Pt. Has difficulty coordinating UEs during shampooing. Goal status: Ongoing  4.  Pt. Will demonstrate compensatory/adaptive equipment strategies for LE dressing tasks.  Baseline: 02/11/24: Pt. reports the LE dressing has improved, however  continues to have difficulty coordinating bilateral hand function. Eval: Pt. has difficulty performing LE dressing   Goal status: Ongoing  5.  Pt. Will identify 2 compensatory strategies for bilateral tremors during ADLs/IADLs Baseline: 02/11/24: Cues for compensatory strategies. Eval: Education to be provided Goal status: Ongoing  6. Pt. Will improve bilateral pinch strength by 3# to be able to securely hold  items in his  hand during ADLs/IADLs Baseline: 02/11/24: Lateral pinch: Right: 12 lbs, Left: 15 lbs, and 3 point pinch: Right: 10 lbs, Left: 15 lbs    Goal status: New ASSESSMENT:  CLINICAL IMPRESSION:  Pt. was able to efficiently flip the [place]  discs 180 degrees simultaneously with the bilateral UEs, however presented tremors when his left hand fatigued while flipping the [place]  discs 360 degrees, as well as when reaching across midline to the left. The tremors increased the further to the right he reached with the left hand. Pt. continues to benefit from OT services to work on improving BUE functioning, and maximize independence with ADLs, and IADL tasks.   PERFORMANCE DEFICITS: in functional skills including ADLs, IADLs, coordination, dexterity, ROM, strength, pain, Fine motor control, Gross motor control, and UE functional use, cognitive skills including , and psychosocial skills including coping strategies, environmental adaptation, and routines and behaviors.   IMPAIRMENTS: are limiting patient from ADLs, IADLs, and leisure.   CO-MORBIDITIES: may have co-morbidities  that affects occupational performance. Patient will benefit from skilled OT to address above impairments and improve overall function.  MODIFICATION OR ASSISTANCE TO COMPLETE EVALUATION: Min-Moderate modification of tasks or assist with assess necessary to complete an evaluation.  OT OCCUPATIONAL PROFILE AND HISTORY: Detailed assessment: Review of records and additional review of physical, cognitive, psychosocial  history related to current functional performance.  CLINICAL DECISION MAKING: Moderate - several treatment options, min-mod task modification necessary  REHAB POTENTIAL: Good  EVALUATION COMPLEXITY: Moderate    PLAN:  OT FREQUENCY: 2x/month  OT DURATION: 12 weeks  PLANNED INTERVENTIONS: 97535 self care/ADL training, 02889 therapeutic exercise, 97530 therapeutic activity, 97112 neuromuscular re-education, 97140 manual therapy, 97018 paraffin, 02989 moist heat, 97034 contrast bath, energy conservation, patient/family education, and DME and/or AE instructions  RECOMMENDED OTHER SERVICES: PT  CONSULTED AND AGREED WITH PLAN OF CARE: Patient  PLAN FOR NEXT SESSION: Treatment  Richardson Otter, MS, OTR/L   03/19/2024

## 2024-03-23 NOTE — Therapy (Signed)
 OUTPATIENT PHYSICAL THERAPY NEURO TREATMENT   Patient Name: [name] MRN: [id number] DOB:06/04/1961, 63 y.o., male Today's Date: 03/24/2024   PCP: Melvin Pao NP REFERRING PROVIDER: Gregory Edsel Ruth PA  END OF SESSION:  PT End of Session - 03/24/24 1619     Visit Number 44    Number of Visits 50    Date for PT Re-Evaluation 04/09/24    Authorization Type eval 2/21    PT Start Time 1619    PT Stop Time 1700    PT Time Calculation (min) 41 min    Equipment Utilized During Treatment Gait belt    Activity Tolerance Patient tolerated treatment well    Behavior During Therapy [facility] for tasks assessed/performed          Past Medical History:  Diagnosis Date   ADHD    Arthritis    Concussion    COVID 12/2020   Hypertension    Multiple lacunar infarcts (HCC)    MVP (mitral valve prolapse)    Pernicious anemia    Rocky [place] spotted fever    Spinal stenosis    Stroke [facility])    Vestibular migraine    Vitamin B12 deficiency    Vitamin D  deficiency    Past Surgical History:  Procedure Laterality Date   ANTERIOR CERVICAL DECOMP/DISCECTOMY FUSION N/A 07/30/2022   Procedure: C4-7 ANTERIOR CERVICAL DISCECTOMY AND FUSION (GLOBUS HEDRON);  Surgeon: Clois Fret, MD;  Location: [facility] ORS;  Service: Neurosurgery;  Laterality: N/A;   BILATERAL CARPAL TUNNEL RELEASE     CERVICAL WOUND DEBRIDEMENT N/A 08/01/2022   Procedure: CERVICAL WOUND DEBRIDEMENT OF HEMATOMA;  Surgeon: Clois Fret, MD;  Location: [facility] ORS;  Service: Neurosurgery;  Laterality: N/A;   COLONOSCOPY     COLONOSCOPY WITH PROPOFOL  N/A 12/17/2023   Procedure: COLONOSCOPY WITH PROPOFOL ;  Surgeon: Maryruth Ole DASEN, MD;  Location: [facility] ENDOSCOPY;  Service: Endoscopy;  Laterality: N/A;   ESOPHAGOGASTRODUODENOSCOPY (EGD) WITH PROPOFOL  N/A 12/17/2023   Procedure: ESOPHAGOGASTRODUODENOSCOPY (EGD) WITH PROPOFOL ;  Surgeon: Maryruth Ole DASEN, MD;  Location: [facility] ENDOSCOPY;  Service: Endoscopy;   Laterality: N/A;   FRACTURE SURGERY Left 1984   intramedullary rod   hip bone transplant  1984   LEG SURGERY Left    oseotomy   POLYPECTOMY  12/17/2023   Procedure: POLYPECTOMY, INTESTINE;  Surgeon: Maryruth Ole DASEN, MD;  Location: [facility] ENDOSCOPY;  Service: Endoscopy;;   REPAIR ANKLE LIGAMENT Right 1978   REPAIR KNEE LIGAMENT Bilateral    acl   TRIGGER FINGER RELEASE Bilateral    thumbs   Patient Active Problem List   Diagnosis Date Noted   Cubital tunnel syndrome on right 02/16/2024   Unintentional weight loss 02/12/2024   Lacunar stroke (HCC) 12/12/2023   Essential tremor 12/12/2023   Dizziness 10/30/2023   Postoperative hematoma of musculoskeletal structure following musculoskeletal procedure 08/01/2022   Cervical myelopathy (HCC) 07/30/2022   Cervical spinal stenosis 07/30/2022   Radiculopathy, cervical 07/30/2022   S/P cervical spinal fusion 07/30/2022   Vitamin D  deficiency 06/25/2022   B12 deficiency 06/25/2022   Elevated LDL cholesterol level 06/25/2022   Prediabetes 03/27/2021   History of nausea- reported 01/27/2020 02/02/2020   Accidental fall from ladder 02/02/2020   Acute right-sided thoracic back pain 02/02/2020   Neck stiffness 02/02/2020   Concussion with no loss of consciousness 02/02/2020   Multiple falls 02/02/2020   Head trauma, initial encounter 02/02/2020   Acute post-traumatic headache, not intractable 02/02/2020   Essential hypertension 08/26/2018   Pre-bariatric surgery nutrition  evaluation 06/19/2018   Migraine with vertigo 01/05/2014   Arthralgia of multiple joints 04/28/2009   Carpal tunnel syndrome 08/30/2008    ONSET DATE: summer 2022  REFERRING DIAG: LE weakness and gait instability   THERAPY DIAG:  Other lack of coordination  Muscle weakness (generalized)  Difficulty in walking, not elsewhere classified  Unsteadiness on feet  Rationale for Evaluation and Treatment: Rehabilitation  SUBJECTIVE:                                                                                                                                                                                              SUBJECTIVE STATEMENT:    Pt [place] class.  Pt reports no pain, but did tip over backwards a few weeks ago and never reported it to the therapist.      Patient had new study: Conclusion: This is an abnormal study.  1. There is electrodiagnostic evidence of chronic, severe right L5-S1 radiculopathy with ongoing active denervation. A left L5 radiculopathy is also likely but was not fully assessed on today's testing. Consider lumbar spine imaging if not already performed. 2. There is electrodiagnostic and sonographic evidence of a chronic, severe right ulnar neuropathy at the elbow with ongoing active denervation.  3. There is no electrodiagnostic evidence for large fiber polyneuropathy.   Pt accompanied by: self  PERTINENT HISTORY:  Patient presents for LE weakness and gait instability. He is s/p ACDF C-7 on 07/30/22. Patient has a leg length discrepancy and limited L knee flexion since his 20's. Patient has a lot of atrophy and lower back pain, foot drop bilaterally.  First symptoms was stumbling up stairs, with LLE. Foot drop was improved after ACDF. Now has numbness and tingling at the end of the day. Does have a few lesions in his pons per wife, on blood pressure medication now. PMH includes ADHD, arthritis, concussion COVID, HTN, multiple lacunar infarcts, MVP, pernicious anemia, rocky mountain spotted fever, spinal stenosis, vestibular migraine, vitamin B deficiency.   Patient had new study: Conclusion: This is an abnormal study.  1. There is electrodiagnostic evidence of chronic, severe right L5-S1 radiculopathy with ongoing active denervation. A left L5 radiculopathy is also likely but was not fully assessed on today's testing. Consider lumbar spine imaging if not already  performed. 2. There is electrodiagnostic and sonographic evidence of a chronic, severe right ulnar neuropathy at the elbow with ongoing active denervation.  3. There is no electrodiagnostic evidence for large fiber polyneuropathy.   PAIN:  Are you having pain? Denies pain  PRECAUTIONS: Other: cervical; no lifting more than 20 lb   WEIGHT BEARING RESTRICTIONS: No  FALLS: Has patient fallen in last 6 months? Yes. Number of falls multiple a week  LIVING ENVIRONMENT: Lives with: lives with their family Lives in: House/apartment Stairs: Yes: Internal: flight steps; is sleeping downstairs Has following equipment at home: Single point cane  PLOF: Independent, uses a walking stick  PATIENT GOALS: gait control of feet, strengthen legs, balance, atrophy in hand  OBJECTIVE:   DIAGNOSTIC FINDINGS:  09/11/22: No acute fracture. No spondylolisthesis. No compression deformities. Disc space narrowing C3-4 with marginal osteophytes consistent with degenerative disc disease. Postop changes ACDF C4-C7 with anatomic alignment. Normal prevertebral and cervicocranial soft tissues.   06/19/22: 1. L5 chronic pars defects with L5-S1 anterolisthesis and advanced disc degeneration causing marked biforaminal L5 impingement. 2. Degeneration and short pedicles causes compressive spinal stenosis from L1-2 to L3-4, most advanced at L2-3. Moderate bilateral foraminal narrowing at the same levels.   COGNITION: Overall cognitive status: Within functional limits for tasks assessed    COORDINATION: Limited LLE  MUSCLE LENGTH: Significant limitation L hamstring; 60 degree flexion deficit.   POSTURE: weight shift to the R  LOWER EXTREMITY ROM:     L knee flexion contraction -60 contraction LOWER EXTREMITY MMT:    MMT Right Eval R 5/15 R 8/22 Left Eval L 5/15 L 8/22  Hip flexion 4 5 5 4  4+ 4  Hip extension        Hip abduction 4- 4 4 4- 4 4  Hip adduction 4- 4 4 4- 4- 4  Hip internal rotation         Hip external rotation        Knee flexion 4- 4 4 3  4- 4-  Knee extension 4 4+ 5 4 4  4-  Ankle dorsiflexion 3 3 3+ 2+ 2+ 2+  Ankle plantarflexion 3 3+ 4- 3 3+ 3+  Ankle inversion        Ankle eversion        (Blank rows = not tested)  01/02/23 Right 8/22: RECERT  10/17 PN Left 8/22: Recert LUE 10/17: PN   Shoulder Flexion 4 4+ 5 4- 4+ 4+  Shoulder Abduction 4- 4 4+ 4- 4 4+  Shoulder Extension 4- 4+ 4 4- 4   Shoulder adduction 4- 4  3+ 4 4+  Bicep  4 4+ 5 4 4+ 4  Triceps 4- 4- 5 3+ 4 4     Right Left  Hip flexion 12.2 10.2  Hip Abduction 7.4 7.0  Hip Adduction 10.8 10.5  Knee Extension  13.8 13.4  Knee Flexion 13.4 13.2  DF    PF       BED MOBILITY:  Perform next session  TRANSFERS: Assistive device utilized: Single point cane  Sit to stand: CGA Stand to sit: CGA Chair to chair: CGA   GAIT: Gait pattern: L knee limited extension, L foot drop, L foot eversion Distance walked: 1200 Assistive device utilized: Single point cane Level of assistance: CGA Comments: Patient has increased eversion and rolling of L foot, LLE leg length discrepancy.   FUNCTIONAL TESTS:  5 times sit to stand: 5.6 seconds with minor use of LLE no UE support Timed up and go (TUG): n/a 6 minute walk test: 1200 ft 10 meter walk test: 7 seconds BERG: 36/56   PATIENT SURVEYS:  FOTO 54  TODAY'S TREATMENT: DATE: 03/24/24 Unless otherwise stated, CGA was provided and gait belt donned in order to ensure pt  safety    ***  TherAct:  Ambulate 150 ft with [employer] walker: improved posture and mobility.  -education on front wheel walker, proper walking technique and mobility  Marching into RTB around knees : no UE support 15x each LE   Sidestepping with RTB around knees 6' x 4 laps down/back  TherEx: Seated dynadisc: df/pf 15x Seated: dynadisc: eversion/inversion 15x  Seated step over orange hurdle 15 x each  Adduction ball squeeze 10x Adduction squeeze with heel raise  10x.  Seated LAQ 2 x 10 with 3 second hold Seated hip abduction with GTB around knees 2 x 15   Seated hip adduction squeezes into rainbow physioball, 3 sec holds, 2x10    Neuro Re-ed:   Standing with CGA next to support surface:  Airex pad: static stand 30 seconds x 2 trials, noticeable trembling of ankles/LE's with fatigue and challenge to maintain stability Airex pad: horizontal head turns  scanning room 10x ; cueing for arc of motion  Airex pad: vertical head turns 10x; upward gaze increasing demand on ankle righting reaction musculature  Airex pad with marching x 10 each LE  Airex pad: one foot on 6 step one foot on airex pad, hold position for 30 seconds, switch legs, 2x each LE;  Airex balance beam: -lateral step 4x length of // bars     PATIENT EDUCATION: Education details: goals, POC, educated on safety during updated exercises  Person educated: Patient and Spouse Education method: Explanation, Demonstration, Tactile cues, and Verbal cues Education comprehension: verbalized understanding, returned demonstration, verbal cues required, and tactile cues required  HOME EXERCISE PROGRAM: Access Code: [email or web address] URL: [email or web address] Date: 10/11/2022 Prepared by: Marina  Moser  Exercises - Seated Ankle Alphabet  - 1 x daily - 7 x weekly - 2 sets - 10 reps - 5 hold - Seated Heel Toe Raises  - 1 x daily - 7 x weekly - 2 sets - 10 reps - 5 hold - Standing March with Counter Support  - 1 x daily - 7 x weekly - 2 sets - 10 reps - 5 hold  Access Code: [email or web address] URL: [email or web address] Date: 10/30/2022 Prepared by: Marina  Moser  Exercises - Seated Ankle Plantarflexion with Resistance  - 1 x daily - 7 x weekly - 2 sets - 10 reps - 5 hold - Seated Ankle Eversion with Resistance  - 1 x daily - 7 x weekly - 2 sets - 10 reps - 5 hold  Access Code: [email or web address] URL: [email or web address] Date: 11/14/2022 Prepared by: Charlena Custard  Exercises - Seated Heel Toe Raises  - 1 x daily - 7 x weekly - 2 sets - 10 reps - 5 hold - Seated Hip Internal Rotation AROM  - 1 x daily - 7 x weekly - 2 sets - 10 reps - 5 hold - Bilateral Long Arc Quad  - 1 x daily - 7 x weekly - 2 sets - 10 reps - 5 hold - Foam Balance Tandem stance  - 1 x daily - 7 x weekly - 2 sets - 2 reps - 30 hold - Single Leg Balance on Foam  - 1 x daily - 7 x weekly - 2 sets - 30 reps - 2 hold - Seated Hip Flexion March with Ankle Weights  - 1 x daily - 7 x weekly - 2 sets - 10 reps - 5 hold - Standing Hip Abduction with Ankle Weight  - 1 x daily - 7 x weekly - 2 sets - 10 reps -  5 hold - Standing Hip Extension with Ankle Weight  - 1 x daily - 7 x weekly - 2 sets - 10 reps - 5 hold - Standing Hip Flexion with Ankle Weight  - 1 x daily - 7 x weekly - 2 sets - 10 reps - 5 hold - Seated Long Arc Quad with Ankle Weight  - 1 x daily - 7 x weekly - 2 sets - 10 reps - 5 hold Access Code: [email or web address] URL: [email or web address] Date: 08/22/2023 Prepared by: Marina  Moser  Exercises - Seated Finger Tip Pinch with Putty  - 1 x daily - 7 x weekly - 2 sets - 10 reps - 5 hold - Hand Squeezes  - 1 x daily - 7 x weekly - 2 sets - 10 reps - 5 hold - Key Pinch with Putty  - 1 x daily - 7 x weekly - 2 sets - 10 reps - 5 hold  GOALS: Goals reviewed with patient? Yes  SHORT TERM GOALS: Target date: 05/09/2023   Patient will be independent in home exercise program to improve strength/mobility for better functional independence with ADLs. Baseline: 2/21: HEP give next session 5/15 : intermittent compliance 8/22: intermittent compliance due to illness 11/14: compliant  Goal status: MET    LONG TERM GOALS: Target date: 04/09/24    Patient will increase FOTO score to equal to or greater than  64%   to demonstrate statistically significant improvement in mobility and quality of life.  Baseline: 2/21: 54% 8/22: 52% 10/17: 57% 11/14: 53% 1/16: 47% Goal status:  terminated  2.  Patient will increase Berg Balance score by > 6 points (42/56)to demonstrate decreased fall risk during functional activities. Baseline: 2/21: 36/56 5/15: 45   Goal status: MET  3.  Patient will increase six minute walk test distance to >1500 for progression to age norm community ambulator and improve gait ability Baseline: 2/21: 1200 ft with [facility] 5/15: 1300 ft with [facility]  8/22: 905 ft with [facility] 10/17: 1100 ft with [facility] 11/14: 1110 ft with [facility] 1/16: 1210 ft with [facility] 3/6: 1100 ft with two canes 5/29: 1195 ft with [facility] 6/19: defer due to patient bringing crutches in today Goal status: In progress  4.  Patient will increase BLE gross strength to 4+/5  or within 1 lb of each LE as to improve functional strength for independent gait, increased standing tolerance and increased ADL ability. Baseline: 2/21 :see above 5/15: see above 10/17: RLE 5/5 LLE 4/5 L ankle 3/5  11/14: RLE 5/5 LLE 4+/5 hamstring 4/5, ankle: 3+/5 1/16: RLE 5/5 LLE 4+/5 ankle 4-/5 3/6: see above 5/29: RLE 5/5 ankle 4-/5 LLE 4+/5 ankle 4-/5 6/19: L df 3-, pf 3; R df 3+, pf 4- Goal status:  In progress / modified  6.  Patient will increase Berg Balance score by > 6 points (51/56) to demonstrate decreased fall risk during functional activities. Baseline:  5/15: 45 8/22: 41/56 10/17: 28 11/14: 36 1/16: 36 3/6: 25 5/29: 34/56  6/19: 39/56  Goal status: Ongoing  7.   Patient will increase UE strength to >4+/5 for ADLs, AD usage, and quality of life.  Baseline: 5/15: see above 8/22: see above 11/14: >4+/5 bilaterally  Goal status: MET  8.   Patient will increase lower extremity functional scale to >60/80 to demonstrate improved functional mobility and increased tolerance with ADLs.  Baseline: 11/14: 29%  1/16: 29%  3/6: 19%  5/29: 36  6/19: 14/80 Goal status: ongoing   ASSESSMENT:  CLINICAL IMPRESSION:    ***  OBJECTIVE IMPAIRMENTS: Abnormal gait, decreased activity tolerance, decreased balance, decreased  coordination, decreased endurance, decreased mobility, difficulty walking, decreased ROM, decreased strength, hypomobility, impaired flexibility, improper body mechanics, and postural dysfunction.   ACTIVITY LIMITATIONS: carrying, lifting, bending, standing, squatting, stairs, transfers, reach over head, locomotion level, and caring for others  PARTICIPATION LIMITATIONS: meal prep, cleaning, laundry, personal finances, driving, shopping, community activity, and yard work  PERSONAL FACTORS: Age, Past/current experiences, Time since onset of injury/illness/exacerbation, and 3+ comorbidities: DHD, arthritis, concussion COVID, HTN, multiple lacunar infarcts, MVP, pernicious anemia, rocky mountain spotted fever, spinal stenosis, vestibular migraine, vitamin B deficiency.  are also affecting patient's functional outcome.   REHAB POTENTIAL: Good  CLINICAL DECISION MAKING: Evolving/moderate complexity  EVALUATION COMPLEXITY: Moderate  PLAN:   PT FREQUENCY: 1x/week  PT DURATION: 12 weeks  PLANNED INTERVENTIONS: Therapeutic exercises, Therapeutic activity, Neuromuscular re-education, Balance training, Gait training, Patient/Family education, Self Care, Joint mobilization, Stair training, Vestibular training, Canalith repositioning, Visual/preceptual remediation/compensation, Orthotic/Fit training, DME instructions, Cognitive remediation, Electrical stimulation, Spinal mobilization, Cryotherapy, Moist heat, Splintting, Taping, Traction, Ultrasound, Manual therapy, and Re-evaluation  PLAN FOR NEXT SESSION:  ***balance, strength, unstable surfaces   Fonda Simpers, PT, DPT Physical Therapist - [place]  [facility]  03/24/24, 4:20 PM

## 2024-03-24 ENCOUNTER — Ambulatory Visit: Admitting: Occupational Therapy

## 2024-03-24 ENCOUNTER — Ambulatory Visit: Attending: Neurosurgery

## 2024-03-24 DIAGNOSIS — R2681 Unsteadiness on feet: Secondary | ICD-10-CM | POA: Insufficient documentation

## 2024-03-24 DIAGNOSIS — R262 Difficulty in walking, not elsewhere classified: Secondary | ICD-10-CM | POA: Diagnosis not present

## 2024-03-24 DIAGNOSIS — M6281 Muscle weakness (generalized): Secondary | ICD-10-CM | POA: Insufficient documentation

## 2024-03-24 DIAGNOSIS — R278 Other lack of coordination: Secondary | ICD-10-CM | POA: Insufficient documentation

## 2024-03-24 NOTE — Therapy (Signed)
 Occupational Therapy Neuro Treatment Note  Patient Name: [name] MRN: [id number] DOB:August 20, 1961, 63 y.o., male Today's Date: 03/24/2024  PCP: Melvin Pao, NP REFERRING PROVIDER: Maree Hila, MARLA, MD  END OF SESSION:  OT End of Session - 03/24/24 1543     Visit Number 13    Number of Visits 24    Date for OT Re-Evaluation 05/05/24    OT Start Time 1533    OT Stop Time 1615    OT Time Calculation (min) 42 min    Activity Tolerance Patient tolerated treatment well    Behavior During Therapy [facility] for tasks assessed/performed           Past Medical History:  Diagnosis Date   ADHD    Arthritis    Concussion    COVID 12/2020   Hypertension    Multiple lacunar infarcts (HCC)    MVP (mitral valve prolapse)    Pernicious anemia    Rocky [place] spotted fever    Spinal stenosis    Stroke [facility])    Vestibular migraine    Vitamin B12 deficiency    Vitamin D  deficiency    Past Surgical History:  Procedure Laterality Date   ANTERIOR CERVICAL DECOMP/DISCECTOMY FUSION N/A 07/30/2022   Procedure: C4-7 ANTERIOR CERVICAL DISCECTOMY AND FUSION (GLOBUS HEDRON);  Surgeon: Clois Fret, MD;  Location: [facility] ORS;  Service: Neurosurgery;  Laterality: N/A;   BILATERAL CARPAL TUNNEL RELEASE     CERVICAL WOUND DEBRIDEMENT N/A 08/01/2022   Procedure: CERVICAL WOUND DEBRIDEMENT OF HEMATOMA;  Surgeon: Clois Fret, MD;  Location: [facility] ORS;  Service: Neurosurgery;  Laterality: N/A;   COLONOSCOPY     COLONOSCOPY WITH PROPOFOL  N/A 12/17/2023   Procedure: COLONOSCOPY WITH PROPOFOL ;  Surgeon: Maryruth Ole DASEN, MD;  Location: [facility] ENDOSCOPY;  Service: Endoscopy;  Laterality: N/A;   ESOPHAGOGASTRODUODENOSCOPY (EGD) WITH PROPOFOL  N/A 12/17/2023   Procedure: ESOPHAGOGASTRODUODENOSCOPY (EGD) WITH PROPOFOL ;  Surgeon: Maryruth Ole DASEN, MD;  Location: [facility] ENDOSCOPY;  Service: Endoscopy;  Laterality: N/A;   FRACTURE SURGERY Left 1984   intramedullary rod   hip bone transplant   1984   LEG SURGERY Left    oseotomy   POLYPECTOMY  12/17/2023   Procedure: POLYPECTOMY, INTESTINE;  Surgeon: Maryruth Ole DASEN, MD;  Location: [facility] ENDOSCOPY;  Service: Endoscopy;;   REPAIR ANKLE LIGAMENT Right 1978   REPAIR KNEE LIGAMENT Bilateral    acl   TRIGGER FINGER RELEASE Bilateral    thumbs   Patient Active Problem List   Diagnosis Date Noted   Cubital tunnel syndrome on right 02/16/2024   Unintentional weight loss 02/12/2024   Lacunar stroke (HCC) 12/12/2023   Essential tremor 12/12/2023   Dizziness 10/30/2023   Postoperative hematoma of musculoskeletal structure following musculoskeletal procedure 08/01/2022   Cervical myelopathy (HCC) 07/30/2022   Cervical spinal stenosis 07/30/2022   Radiculopathy, cervical 07/30/2022   S/P cervical spinal fusion 07/30/2022   Vitamin D  deficiency 06/25/2022   B12 deficiency 06/25/2022   Elevated LDL cholesterol level 06/25/2022   Prediabetes 03/27/2021   History of nausea- reported 01/27/2020 02/02/2020   Accidental fall from ladder 02/02/2020   Acute right-sided thoracic back pain 02/02/2020   Neck stiffness 02/02/2020   Concussion with no loss of consciousness 02/02/2020   Multiple falls 02/02/2020   Head trauma, initial encounter 02/02/2020   Acute post-traumatic headache, not intractable 02/02/2020   Essential hypertension 08/26/2018   Pre-bariatric surgery nutrition evaluation 06/19/2018   Migraine with vertigo 01/05/2014   Arthralgia of multiple joints 04/28/2009  Carpal tunnel syndrome 08/30/2008    ONSET DATE: 07/30/2022  REFERRING DIAG: ACDF, Hx of CVAs  THERAPY DIAG:  Muscle weakness (generalized)  Other lack of coordination  Rationale for Evaluation and Treatment: Rehabilitation  SUBJECTIVE:   SUBJECTIVE STATEMENT: Pt.  Reports [place]  with [place] summer camp this week 2/2 rain. next scheduled visit.  Pt accompanied by: self  PERTINENT HISTORY: Pt. underwent an ACDF  C4-C7. L5-S1 anterolithesis, and disc degeneration causing marked biforminal L5 impingement, Spinal stenosis from L1-2 to L3-4 with L2-3 being the most advanced. Bilateral hand tremors. Bilateral Lacunar infarcts in 2020. See above list for PMHx .  PRECAUTIONS: None  WEIGHT BEARING RESTRICTIONS: No  PAIN:  Are you having pain? 1/10 in the shoulders/neck  FALLS: Has patient fallen in last 6 months? Yes. Number of falls 6-10 falls  LIVING ENVIRONMENT: Lives with: lives with their spouse Lives in: House/apartment 2 levels Stairs: 4-5 steps. Has following equipment at home: Single point cane, Walker - 2 wheeled, and shower chair  PLOF: Independent  PATIENT GOALS:  To improve bilateral hand cooridnation  OBJECTIVE:  Note: Objective measures were completed at Evaluation unless otherwise noted.  HAND DOMINANCE: Right hand dominant  ADLs:  Transfers/ambulation related to ADLs: Eating: feels like hand, and mouth are not working together, drinks drip out of mouth, Difficulty using fork, and knife together for self-feeding. Grooming: Independent brushing teeth UB Dressing: Difficulty buttoning LB Dressing: Difficulty managing pants, shoes, and socks Toileting: Independent Bathing: incoordination with applying shampoo with bilateral hands Tub Shower transfers: Distant supervision   IADLs: Shopping: Delivery Light housekeeping: Difficulty with vacuuming-bending over Meal Prep:  Independent Community mobility:  Independent Medication management: Wife sets up pillbox-Pt. Takes pills independently  Financial management: No changes Handwriting: 75% legible  MOBILITY STATUS: Hx of falls  POSTURE COMMENTS:   Sitting balance: good  ACTIVITY TOLERANCE: Activity tolerance: Fair   UPPER EXTREMITY ROM:    Active ROM Right Eval WFL Left Eval WFL  (Blank rows = not tested)  UPPER EXTREMITY MMT:     MMT Right Eval  02/11/24 Left Eval  02/11/24  Shoulder flexion 4+/5 5/5  4+/5 5/5  Shoulder abduction 4+/5 4+/5 4+/5 4+/5  Shoulder adduction      Shoulder extension      Shoulder internal rotation      Shoulder external rotation      Middle trapezius      Lower trapezius      Elbow flexion 5/5 5/5 5/5 5/5  Elbow extension 5/5 5/5 5/5 5/5  Wrist flexion      Wrist extension 5/5 5/5 5/5 5/5  Wrist ulnar deviation      Wrist radial deviation      Wrist pronation      Wrist supination      (Blank rows = not tested)  HAND FUNCTION: Grip strength: Right: 48 lbs; Left: 44 lbs, Lateral pinch: Right: 17 lbs, Left: 16 lbs, and 3 point pinch: Right: 12 lbs, Left: 19 lbs  02/11/24:   Grip strength: Right: 57 lbs; Left: 52 lbs, Lateral pinch: Right: 12 lbs, Left: 15 lbs, and 3 point pinch: Right: 10 lbs, Left: 15 lbs  COORDINATION: 9 Hole Peg test: Right: 25 sec; Left: 26 sec  9 Hole Peg test: Right: 21 sec; Left: 26 sec  SENSATION:  Pt. reports no changes  EDEMA: N/A  MUSCLE TONE: Intact;  Atrophy at the right dorsal interossei muscles.  COGNITION: Overall cognitive status:  WFL for tasks performed  VISION:  Wears glasses. No change from baseline   PERCEPTION: intact   OBSERVATIONS: Pt. Left his 2 wheeled scooter in the waiting/reception area                                                                                                                             TREATMENT DATE:  03/24/24  Therapeutic Ex.:   -UE strengthening through reciprocal motion using the [employer] for 8 min. on level 4.0 in both forward, and reverse directions with constant monitoring of the BUEs. UE strengthening was performed to increase strength needed for ADLs, and IADLs.     Neuromuscular re-ed:  -Facilitated FMC skills grasping 1 thin Jamar pegs, and worked on grasping them from a horizontal position in the dish with his left switching to the right hand. -Facilitated FMC skills using the Jamar Tweezer Dexterity Task using resistive tweezers to pick up 1 thin  sticks from a horizontal position, and sustaining the grasp while preparing to place them into a vertical position with the pegboard placed at a flat tabletop surface. -Pt. worked on using the [employer] to remove the 1 sticks from the pegboard -Pt. worked on removing the thin Jamar pegs alternating thumb opposition to the tip of the 2nd through 5th digits of each hand.  PATIENT EDUCATION: Education details:  [facility] skills, positioning, and dual tasking. Education method: Medical illustrator Education comprehension: verbalized understanding and returned demonstration  HOME EXERCISE PROGRAM:   Hand strengthening with green theraputtty.   GOALS: Goals reviewed with patient? Yes  SHORT TERM GOALS: Target date: 03/24/2024   Pt. Will be independent with HEPs for BUEs  Baseline: 02/11/2024: Independent with hand exercises with the theraputty. Eval: No current HEP Goal status: Ongoing  LONG TERM GOALS: Target date: 05/05/24  Pt. Will perform buttoning with modified independence Baseline: 02/11/24: Independent buttoning a shirt-depending upon button size. Pt reports having difficulty with the small cuff sleeves for which he uses a buttonhook for at home, however it is not usually readily available if he needs to button/unbutton his sleeve cuff when he is out. Eval: Buttoning is difficult Goal status: Achieved  2.  Pt. Will be able to cut food with Modified independence Baseline: 02/11/24: Pt. Reports improvement with cutting food, however still has difficulty with bilateral hand coordination. Eval: Pt. Has difficulty using bilateral hand to cut food. Goal status: Ongoing  3.  Pt. Will improve bilateral UE strength by 2 mm grades to be able to sustain bilateral UEs in elevation while coordinating bilateral hands for shampooing. Baseline: 02/11/24: Improved. BUE shoulder flexion: 5/5, abduction: 4+/5, elbow flexion, and extension 5/5, wrist extension: 5/5. Pt. Has improved with  washing/shampooing hair, however reports still having difficulty with coordinating bilateral hand movements. Eval: Pt. Has difficulty coordinating UEs during shampooing. Goal status: Ongoing  4.  Pt. Will demonstrate compensatory/adaptive equipment strategies for LE dressing tasks.  Baseline: 02/11/24: Pt. reports the LE dressing  has improved, however continues to have difficulty coordinating bilateral hand function. Eval: Pt. has difficulty performing LE dressing   Goal status: Ongoing  5.  Pt. Will identify 2 compensatory strategies for bilateral tremors during ADLs/IADLs Baseline: 02/11/24: Cues for compensatory strategies. Eval: Education to be provided Goal status: Ongoing  6. Pt. Will improve bilateral pinch strength by 3# to be able to securely hold items in his  hand during ADLs/IADLs Baseline: 02/11/24: Lateral pinch: Right: 12 lbs, Left: 15 lbs, and 3 point pinch: Right: 10 lbs, Left: 15 lbs    Goal status: New ASSESSMENT:  CLINICAL IMPRESSION:  Pt. reports having an appointment  scheduled with the neurosurgeon this Thursday. Pt. requires increased time, and assist to grasp the 1 thin pegs with the resistive tweezers while transitioning them from horizontally in the dish to vertically on the pegboard. Pt. frequently switched between the right, and left hands during each of the [facility] tasks. Pt. reports having more FMC control, and cooperation between the fingers with the right hand.  Pt. Is able to achieve more pressure control with the tweezers when using the left hand. Pt. continues to benefit from OT services to work on improving BUE functioning, and maximize independence with ADLs, and IADL tasks.   PERFORMANCE DEFICITS: in functional skills including ADLs, IADLs, coordination, dexterity, ROM, strength, pain, Fine motor control, Gross motor control, and UE functional use, cognitive skills including , and psychosocial skills including coping strategies, environmental adaptation, and  routines and behaviors.   IMPAIRMENTS: are limiting patient from ADLs, IADLs, and leisure.   CO-MORBIDITIES: may have co-morbidities  that affects occupational performance. Patient will benefit from skilled OT to address above impairments and improve overall function.  MODIFICATION OR ASSISTANCE TO COMPLETE EVALUATION: Min-Moderate modification of tasks or assist with assess necessary to complete an evaluation.  OT OCCUPATIONAL PROFILE AND HISTORY: Detailed assessment: Review of records and additional review of physical, cognitive, psychosocial history related to current functional performance.  CLINICAL DECISION MAKING: Moderate - several treatment options, min-mod task modification necessary  REHAB POTENTIAL: Good  EVALUATION COMPLEXITY: Moderate    PLAN:  OT FREQUENCY: 2x/month  OT DURATION: 12 weeks  PLANNED INTERVENTIONS: 97535 self care/ADL training, 02889 therapeutic exercise, 97530 therapeutic activity, 97112 neuromuscular re-education, 97140 manual therapy, 97018 paraffin, 02989 moist heat, 97034 contrast bath, energy conservation, patient/family education, and DME and/or AE instructions  RECOMMENDED OTHER SERVICES: PT  CONSULTED AND AGREED WITH PLAN OF CARE: Patient  PLAN FOR NEXT SESSION: Treatment  Richardson Otter, MS, OTR/L   03/24/24

## 2024-03-26 ENCOUNTER — Encounter: Payer: Self-pay | Admitting: Neurosurgery

## 2024-03-26 ENCOUNTER — Encounter: Admitting: Occupational Therapy

## 2024-03-26 ENCOUNTER — Ambulatory Visit: Admitting: Neurosurgery

## 2024-03-26 VITALS — BP 128/72 | Ht 67.0 in | Wt 138.0 lb

## 2024-03-26 DIAGNOSIS — M4310 Spondylolisthesis, site unspecified: Secondary | ICD-10-CM

## 2024-03-26 DIAGNOSIS — M5416 Radiculopathy, lumbar region: Secondary | ICD-10-CM

## 2024-03-26 DIAGNOSIS — M62562 Muscle wasting and atrophy, not elsewhere classified, left lower leg: Secondary | ICD-10-CM | POA: Diagnosis not present

## 2024-03-26 DIAGNOSIS — M62561 Muscle wasting and atrophy, not elsewhere classified, right lower leg: Secondary | ICD-10-CM | POA: Diagnosis not present

## 2024-03-27 NOTE — Progress Notes (Signed)
 Referring Physician:  Melvin Pao, NP [address] [place],  [place] [address]  Primary Physician:  Melvin Pao, NP  History of Present Illness: 03/26/2024 [name] returns to see me.  He has had worsened muscle function in his lower extremities.  He is seen in neuromuscular specialist at [facility] where nerve conduction study showed active denervation at L5.  He has a known spondylolisthesis with severe L5 nerve root compression.  He also has a known ulnar neuropathy for which he is seeing my partner.  He does not have traditional radicular symptoms.  02/12/2024 from [facility]'s note [name] is here today with a chief complaint of ulnar neuropathy. He has a significant history of cervicaly myelpathy as well as a brainstem stroke. He has had progressive worsening and wasting of his right hand. EMG demonstrated servere ulnar neuropathy.  He states that he gets no tingling or pain in the arm but he has noticed atrophy and weakness.  There is some concern whether or not he is losing dexterity but he states that he still able to do his buttons.  He does have tremulousness throughout his body.  He does notice that the micro shakes/movements in his right upper extremity have gotten worse.   Review of Systems:  A 10 point review of systems is negative, except for the pertinent positives and negatives detailed in the HPI.  Past Medical History: Past Medical History:  Diagnosis Date   ADHD    Arthritis    Concussion    COVID 12/2020   Hypertension    Multiple lacunar infarcts (HCC)    MVP (mitral valve prolapse)    Pernicious anemia    Rocky Mountain spotted fever    Spinal stenosis    Stroke [facility])    Vestibular migraine    Vitamin B12 deficiency    Vitamin D  deficiency     Past Surgical History: Past Surgical History:  Procedure Laterality Date   ANTERIOR CERVICAL DECOMP/DISCECTOMY FUSION N/A 07/30/2022   Procedure: C4-7 ANTERIOR CERVICAL DISCECTOMY AND  FUSION (GLOBUS HEDRON);  Surgeon: Clois Fret, MD;  Location: [facility] ORS;  Service: Neurosurgery;  Laterality: N/A;   BILATERAL CARPAL TUNNEL RELEASE     CERVICAL WOUND DEBRIDEMENT N/A 08/01/2022   Procedure: CERVICAL WOUND DEBRIDEMENT OF HEMATOMA;  Surgeon: Clois Fret, MD;  Location: [facility] ORS;  Service: Neurosurgery;  Laterality: N/A;   COLONOSCOPY     COLONOSCOPY WITH PROPOFOL  N/A 12/17/2023   Procedure: COLONOSCOPY WITH PROPOFOL ;  Surgeon: Maryruth Ole DASEN, MD;  Location: [facility] ENDOSCOPY;  Service: Endoscopy;  Laterality: N/A;   ESOPHAGOGASTRODUODENOSCOPY (EGD) WITH PROPOFOL  N/A 12/17/2023   Procedure: ESOPHAGOGASTRODUODENOSCOPY (EGD) WITH PROPOFOL ;  Surgeon: Maryruth Ole DASEN, MD;  Location: [facility] ENDOSCOPY;  Service: Endoscopy;  Laterality: N/A;   FRACTURE SURGERY Left 1984   intramedullary rod   hip bone transplant  1984   LEG SURGERY Left    oseotomy   POLYPECTOMY  12/17/2023   Procedure: POLYPECTOMY, INTESTINE;  Surgeon: Maryruth Ole DASEN, MD;  Location: [facility] ENDOSCOPY;  Service: Endoscopy;;   REPAIR ANKLE LIGAMENT Right 1978   REPAIR KNEE LIGAMENT Bilateral    acl   TRIGGER FINGER RELEASE Bilateral    thumbs    Allergies: Allergies as of 03/26/2024 - Review Complete 03/26/2024  Allergen Reaction Noted   Lisinopril  Other (See Comments) 01/16/2024   Propranolol  Other (See Comments) 01/16/2024    Medications:  Current Outpatient Medications:    acetaminophen  (TYLENOL ) 500 MG tablet, Take 500 mg by  mouth every 6 (six) hours as needed., Disp: , Rfl:    amLODipine  (NORVASC ) 5 MG tablet, Take 5 mg by mouth daily., Disp: , Rfl:    aspirin EC 325 MG tablet, Take 325 mg by mouth as needed (Pain)., Disp: , Rfl:    aspirin EC 81 MG tablet, Take 81 mg by mouth daily. Swallow whole., Disp: , Rfl:    chlorhexidine  (PERIDEX ) 0.12 % solution, Use as directed 5 mLs in the mouth or throat 2 (two) times daily. FLOSS AND BRUSH TEETH. SWISH 1 CAPFUL IN MOUTH THEN EXPECTORATE.  DO NOT EAT/DRINK 2-3 HOURS AFTER, Disp: , Rfl:    Cholecalciferol  (VITAMIN D3 PO), Take 5,000 Int'l Units by mouth daily., Disp: , Rfl:    Cyanocobalamin  (VITAMIN B 12 PO), Take 5,000 mcg by mouth daily., Disp: , Rfl:    Cyanocobalamin  1000 MCG/ML KIT, Inject into the muscle. Every three months, Disp: , Rfl:    diazepam  (VALIUM ) 2 MG tablet, Take 2 mg by mouth every 12 (twelve) hours as needed for anxiety or muscle spasms., Disp: , Rfl:    DM-APAP-CPM (CORICIDIN HBP PO), Take by mouth as needed., Disp: , Rfl:    Docusate Sodium  (COLACE PO), Take by mouth daily. Daily, Disp: , Rfl:    hydrochlorothiazide  (MICROZIDE ) 12.5 MG capsule, Take 1 capsule (12.5 mg total) by mouth daily., Disp: 90 capsule, Rfl: 3   LORazepam  (ATIVAN ) 0.5 MG tablet, Take 1 tablet (0.5 mg total) by mouth 2 (two) times daily as needed for anxiety., Disp: 30 tablet, Rfl: 0   mirtazapine  (REMERON  SOL-TAB) 15 MG disintegrating tablet, Take 1 tablet (15 mg total) by mouth at bedtime., Disp: 90 tablet, Rfl: 0   olmesartan  (BENICAR ) 20 MG tablet, Take 1 tablet (20 mg total) by mouth daily., Disp: 30 tablet, Rfl: 3   pyridOXINE (B-6) 50 MG tablet, Take 50 mg by mouth daily., Disp: , Rfl:    rizatriptan  (MAXALT ) 10 MG tablet, Take 1 tablet (10 mg total) by mouth as needed for migraine. May repeat in 2 hours if needed, Disp: 10 tablet, Rfl: 0  Social History: Social History   Tobacco Use   Smoking status: Never   Smokeless tobacco: Never  Vaping Use   Vaping status: Never Used  Substance Use Topics   Alcohol use: Not Currently    Comment: 1 beer per month   Drug use: No    Family Medical History: Family History  Problem Relation Age of Onset   Breast cancer Mother    Thyroid  cancer Mother    Lymphoma Father    CVA Father    Heart disease Sister        Dysautonomia   Hypertension Sister    Heart attack Maternal Grandfather     Physical Examination: Vitals:   03/26/24 1457  BP: 128/72    General: Patient is in  no apparent distress. Attention to examination is appropriate.  Neck:   Supple.  Full range of motion.  Respiratory: Patient is breathing without any difficulty.   NEUROLOGICAL:   Motor Exam: Significant wasting noted in the right greater than left intrinsic hand muscles.    He has 4 to 4+ out of 5 right sided grip and interosseous strength.  He has 5 out of 5 elsewhere in his upper extremities.  Side Iliopsoas Quads Hamstring PF DF EHL  R 5 5 5  4- 3 4+  L 5 5 5  4- 3 2      Medical Decision Making MRI L  spine 02/29/2024 IMPRESSION: 1. Chronic L5 spondylolysis with grade 1 spondylolisthesis and severe bilateral L5 neural foraminal stenosis is stable from a 2023 MRI.   2. Multifactorial chronic but increased spinal and lateral recess stenosis at L1-L2 (moderate to severe greater on the right), L2-L3 (moderate to severe and greater on the left), and L3-L4 (moderate and greater on the left). And focal progression of a right lateral recess small disc extrusion at L1-L2 with associated degenerative endplate marrow edema there. Query right L2 radiculitis.     Electronically Signed   By: VEAR Hurst M.D.   On: 03/02/2024 12:07   Electrodiagnostics: His most recent upper extremity EMG is documented in the patient's chart, in summary this was a severe ongoing neuropathy at the ulnar nerve on the right with no evidence of active radiculopathy.  He also has lower extremity EMG concerning for active L5 denervation  I have personally reviewed the images and electrodiagnostics and agree with the above interpretation.  Assessment and Plan: [name] is a pleasant 63 y.o. male with a history of cervical myeloradiculopathy and progressive ulnar neuropathy.  He sees my partner for this.  I agree with my partner that ulnar decompression is indicated.  The patient is adamant that he would not like to do this.  He also has EMG evidence of active L5 denervation with weakness of L5 mediated  muscles.  He does not have classic radicular symptoms, but clearly has severe L5 compression due to his anterolisthesis secondary to pars defects at L5.  1 could consider an L5-S1 anterior lumbar interbody fusion with percutaneous fixation.  I am not certain this will stop his atrophy, but I think that is reasonable.  Before proceeding with this, I would like to talk with his neurologist.  During our discussion of his ulnar neuropathy, he attempted to demonstrate the strength of his hand by striking the wall in the clinic room.  I expressed to this that this could be interpreted as a violent act, and that it was unacceptable.  He expressed immediate remorse, and apologized.  Accepted his apology, but made my concerns explicitly clear.  I have discussed with the patient that any additional aggressive actions such as this will not be tolerated and will result in dismissal from the clinic.   After I talk with Dr. Maree, I will get back in touch with him regarding the plan for his lumbar spine.   I spent a total of 60 minutes in this patient's care today. This time was spent reviewing pertinent records including imaging studies, obtaining and confirming history, performing a directed evaluation, formulating and discussing my recommendations, and documenting the visit within the medical record.   Thank you for involving me in the care of this patient.    Reeves Daisy MD Neurosurgery

## 2024-03-31 ENCOUNTER — Ambulatory Visit: Admitting: Neurosurgery

## 2024-04-02 ENCOUNTER — Ambulatory Visit: Payer: 59

## 2024-04-02 ENCOUNTER — Encounter: Admitting: Occupational Therapy

## 2024-04-02 DIAGNOSIS — R278 Other lack of coordination: Secondary | ICD-10-CM

## 2024-04-02 DIAGNOSIS — R262 Difficulty in walking, not elsewhere classified: Secondary | ICD-10-CM

## 2024-04-02 DIAGNOSIS — F840 Autistic disorder: Secondary | ICD-10-CM | POA: Diagnosis not present

## 2024-04-02 DIAGNOSIS — R2681 Unsteadiness on feet: Secondary | ICD-10-CM | POA: Diagnosis not present

## 2024-04-02 DIAGNOSIS — M6281 Muscle weakness (generalized): Secondary | ICD-10-CM | POA: Diagnosis not present

## 2024-04-02 DIAGNOSIS — R4681 Obsessive-compulsive behavior: Secondary | ICD-10-CM | POA: Diagnosis not present

## 2024-04-02 DIAGNOSIS — F0670 Mild neurocognitive disorder due to known physiological condition without behavioral disturbance: Secondary | ICD-10-CM | POA: Diagnosis not present

## 2024-04-02 NOTE — Therapy (Signed)
 OUTPATIENT PHYSICAL THERAPY NEURO TREATMENT   Patient Name: [name] MRN: [id number] DOB:01/13/1961, 63 y.o., male Today's Date: 04/02/2024   PCP: Melvin Pao NP REFERRING PROVIDER: Gregory Edsel Ruth PA  END OF SESSION:  PT End of Session - 04/02/24 1406     Visit Number 45    Number of Visits 50    Date for PT Re-Evaluation 04/09/24    Authorization Type eval 2/21    PT Start Time 1404    PT Stop Time 1445    PT Time Calculation (min) 41 min    Equipment Utilized During Treatment Gait belt    Activity Tolerance Patient tolerated treatment well    Behavior During Therapy [facility] for tasks assessed/performed           Past Medical History:  Diagnosis Date   ADHD    Arthritis    Concussion    COVID 12/2020   Hypertension    Multiple lacunar infarcts (HCC)    MVP (mitral valve prolapse)    Pernicious anemia    Rocky Mountain spotted fever    Spinal stenosis    Stroke [facility])    Vestibular migraine    Vitamin B12 deficiency    Vitamin D  deficiency    Past Surgical History:  Procedure Laterality Date   ANTERIOR CERVICAL DECOMP/DISCECTOMY FUSION N/A 07/30/2022   Procedure: C4-7 ANTERIOR CERVICAL DISCECTOMY AND FUSION (GLOBUS HEDRON);  Surgeon: Clois Fret, MD;  Location: [facility] ORS;  Service: Neurosurgery;  Laterality: N/A;   BILATERAL CARPAL TUNNEL RELEASE     CERVICAL WOUND DEBRIDEMENT N/A 08/01/2022   Procedure: CERVICAL WOUND DEBRIDEMENT OF HEMATOMA;  Surgeon: Clois Fret, MD;  Location: [facility] ORS;  Service: Neurosurgery;  Laterality: N/A;   COLONOSCOPY     COLONOSCOPY WITH PROPOFOL  N/A 12/17/2023   Procedure: COLONOSCOPY WITH PROPOFOL ;  Surgeon: Maryruth Ole DASEN, MD;  Location: [facility] ENDOSCOPY;  Service: Endoscopy;  Laterality: N/A;   ESOPHAGOGASTRODUODENOSCOPY (EGD) WITH PROPOFOL  N/A 12/17/2023   Procedure: ESOPHAGOGASTRODUODENOSCOPY (EGD) WITH PROPOFOL ;  Surgeon: Maryruth Ole DASEN, MD;  Location: [facility] ENDOSCOPY;  Service: Endoscopy;   Laterality: N/A;   FRACTURE SURGERY Left 1984   intramedullary rod   hip bone transplant  1984   LEG SURGERY Left    oseotomy   POLYPECTOMY  12/17/2023   Procedure: POLYPECTOMY, INTESTINE;  Surgeon: Maryruth Ole DASEN, MD;  Location: [facility] ENDOSCOPY;  Service: Endoscopy;;   REPAIR ANKLE LIGAMENT Right 1978   REPAIR KNEE LIGAMENT Bilateral    acl   TRIGGER FINGER RELEASE Bilateral    thumbs   Patient Active Problem List   Diagnosis Date Noted   Cubital tunnel syndrome on right 02/16/2024   Unintentional weight loss 02/12/2024   Lacunar stroke (HCC) 12/12/2023   Essential tremor 12/12/2023   Dizziness 10/30/2023   Postoperative hematoma of musculoskeletal structure following musculoskeletal procedure 08/01/2022   Cervical myelopathy (HCC) 07/30/2022   Cervical spinal stenosis 07/30/2022   Radiculopathy, cervical 07/30/2022   S/P cervical spinal fusion 07/30/2022   Vitamin D  deficiency 06/25/2022   B12 deficiency 06/25/2022   Elevated LDL cholesterol level 06/25/2022   Prediabetes 03/27/2021   History of nausea- reported 01/27/2020 02/02/2020   Accidental fall from ladder 02/02/2020   Acute right-sided thoracic back pain 02/02/2020   Neck stiffness 02/02/2020   Concussion with no loss of consciousness 02/02/2020   Multiple falls 02/02/2020   Head trauma, initial encounter 02/02/2020   Acute post-traumatic headache, not intractable 02/02/2020   Essential hypertension 08/26/2018   Pre-bariatric surgery  nutrition evaluation 06/19/2018   Migraine with vertigo 01/05/2014   Arthralgia of multiple joints 04/28/2009   Carpal tunnel syndrome 08/30/2008    ONSET DATE: summer 2022  REFERRING DIAG: LE weakness and gait instability   THERAPY DIAG:  Muscle weakness (generalized)  Other lack of coordination  Difficulty in walking, not elsewhere classified  Unsteadiness on feet  Rationale for Evaluation and Treatment: Rehabilitation  SUBJECTIVE:                                                                                                                                                                                              SUBJECTIVE STATEMENT:    Pt reports he has been dreaming about upper body exercises, stating he wants to do them in order to more effectively use his crutches.    Pt accompanied by: self  PERTINENT HISTORY:  Patient presents for LE weakness and gait instability. He is s/p ACDF C-7 on 07/30/22. Patient has a leg length discrepancy and limited L knee flexion since his 20's. Patient has a lot of atrophy and lower back pain, foot drop bilaterally.  First symptoms was stumbling up stairs, with LLE. Foot drop was improved after ACDF. Now has numbness and tingling at the end of the day. Does have a few lesions in his pons per wife, on blood pressure medication now. PMH includes ADHD, arthritis, concussion COVID, HTN, multiple lacunar infarcts, MVP, pernicious anemia, rocky mountain spotted fever, spinal stenosis, vestibular migraine, vitamin B deficiency.   Patient had new study: Conclusion: This is an abnormal study.  1. There is electrodiagnostic evidence of chronic, severe right L5-S1 radiculopathy with ongoing active denervation. A left L5 radiculopathy is also likely but was not fully assessed on today's testing. Consider lumbar spine imaging if not already performed. 2. There is electrodiagnostic and sonographic evidence of a chronic, severe right ulnar neuropathy at the elbow with ongoing active denervation.  3. There is no electrodiagnostic evidence for large fiber polyneuropathy.   PAIN:  Are you having pain? Denies pain   PRECAUTIONS: Other: cervical; no lifting more than 20 lb   WEIGHT BEARING RESTRICTIONS: No  FALLS: Has patient fallen in last 6 months? Yes. Number of falls multiple a week  LIVING ENVIRONMENT: Lives with: lives with their family Lives in: House/apartment Stairs: Yes: Internal: flight steps; is sleeping  downstairs Has following equipment at home: Single point cane  PLOF: Independent, uses a walking stick  PATIENT GOALS: gait control of feet, strengthen legs, balance, atrophy in hand  OBJECTIVE:   DIAGNOSTIC FINDINGS:  09/11/22: No acute fracture. No spondylolisthesis. No compression deformities. Disc space narrowing  C3-4 with marginal osteophytes consistent with degenerative disc disease. Postop changes ACDF C4-C7 with anatomic alignment. Normal prevertebral and cervicocranial soft tissues.   06/19/22: 1. L5 chronic pars defects with L5-S1 anterolisthesis and advanced disc degeneration causing marked biforaminal L5 impingement. 2. Degeneration and short pedicles causes compressive spinal stenosis from L1-2 to L3-4, most advanced at L2-3. Moderate bilateral foraminal narrowing at the same levels.   COGNITION: Overall cognitive status: Within functional limits for tasks assessed    COORDINATION: Limited LLE  MUSCLE LENGTH: Significant limitation L hamstring; 60 degree flexion deficit.   POSTURE: weight shift to the R  LOWER EXTREMITY ROM:     L knee flexion contraction -60 contraction LOWER EXTREMITY MMT:    MMT Right Eval R 5/15 R 8/22 Left Eval L 5/15 L 8/22  Hip flexion 4 5 5 4  4+ 4  Hip extension        Hip abduction 4- 4 4 4- 4 4  Hip adduction 4- 4 4 4- 4- 4  Hip internal rotation        Hip external rotation        Knee flexion 4- 4 4 3  4- 4-  Knee extension 4 4+ 5 4 4  4-  Ankle dorsiflexion 3 3 3+ 2+ 2+ 2+  Ankle plantarflexion 3 3+ 4- 3 3+ 3+  Ankle inversion        Ankle eversion        (Blank rows = not tested)  01/02/23 Right 8/22: RECERT  10/17 PN Left 8/22: Recert LUE 10/17: PN   Shoulder Flexion 4 4+ 5 4- 4+ 4+  Shoulder Abduction 4- 4 4+ 4- 4 4+  Shoulder Extension 4- 4+ 4 4- 4   Shoulder adduction 4- 4  3+ 4 4+  Bicep  4 4+ 5 4 4+ 4  Triceps 4- 4- 5 3+ 4 4     Right Left  Hip flexion 12.2 10.2  Hip Abduction 7.4 7.0  Hip Adduction 10.8  10.5  Knee Extension  13.8 13.4  Knee Flexion 13.4 13.2  DF    PF       BED MOBILITY:  Perform next session  TRANSFERS: Assistive device utilized: Single point cane  Sit to stand: CGA Stand to sit: CGA Chair to chair: CGA   GAIT: Gait pattern: L knee limited extension, L foot drop, L foot eversion Distance walked: 1200 Assistive device utilized: Single point cane Level of assistance: CGA Comments: Patient has increased eversion and rolling of L foot, LLE leg length discrepancy.   FUNCTIONAL TESTS:  5 times sit to stand: 5.6 seconds with minor use of LLE no UE support Timed up and go (TUG): n/a 6 minute walk test: 1200 ft 10 meter walk test: 7 seconds BERG: 36/56   PATIENT SURVEYS:  FOTO 54  TODAY'S TREATMENT: DATE: 04/02/24  Unless otherwise stated, CGA was provided and gait belt donned in order to ensure pt safety    TherAct:  In // bars:  Forward walking without AD in // bars, no UE assistance, x10 Walk forward/backwards without UE support 8x length Lateral step length of bars x10 each direction; cue for visual feedback   Lateral weight shifts on wobble board, 2x15 each direction for challenging proprioception and balance, intermittent use of the UE's for support   TherEx:   Seated with dynadisc under foot: eversion/inversion x10 circles x10  Seated heel raises with forefoot placed on half foam roller, 2x15 Seated toes raises with forefoot placed on half  foam roller, 2x15   Self-Care and Home Management:  Pt educated on rationale for neurologist concern regarding the LE atrophy that the pt is experiencing, along with a caution regarding numbness in the LE's.  Pt advised that if he ever started to have numbness in a cauda equina pathway, he needed to get to the ED immediately.  Pt understanding and appreciative of the education regarding nerve pathways and complications that can occur with nerve damage.       PATIENT EDUCATION: Education details:  goals, POC, educated on safety during updated exercises  Person educated: Patient and Spouse Education method: Explanation, Demonstration, Tactile cues, and Verbal cues Education comprehension: verbalized understanding, returned demonstration, verbal cues required, and tactile cues required  HOME EXERCISE PROGRAM: Access Code: [email or web address] URL: [email or web address] Date: 10/11/2022 Prepared by: Marina  Moser  Exercises - Seated Ankle Alphabet  - 1 x daily - 7 x weekly - 2 sets - 10 reps - 5 hold - Seated Heel Toe Raises  - 1 x daily - 7 x weekly - 2 sets - 10 reps - 5 hold - Standing March with Counter Support  - 1 x daily - 7 x weekly - 2 sets - 10 reps - 5 hold  Access Code: [email or web address] URL: [email or web address] Date: 10/30/2022 Prepared by: Marina  Moser  Exercises - Seated Ankle Plantarflexion with Resistance  - 1 x daily - 7 x weekly - 2 sets - 10 reps - 5 hold - Seated Ankle Eversion with Resistance  - 1 x daily - 7 x weekly - 2 sets - 10 reps - 5 hold  Access Code: [email or web address] URL: [email or web address] Date: 11/14/2022 Prepared by: Marina  Moser  Exercises - Seated Heel Toe Raises  - 1 x daily - 7 x weekly - 2 sets - 10 reps - 5 hold - Seated Hip Internal Rotation AROM  - 1 x daily - 7 x weekly - 2 sets - 10 reps - 5 hold - Bilateral Long Arc Quad  - 1 x daily - 7 x weekly - 2 sets - 10 reps - 5 hold - Foam Balance Tandem stance  - 1 x daily - 7 x weekly - 2 sets - 2 reps - 30 hold - Single Leg Balance on Foam  - 1 x daily - 7 x weekly - 2 sets - 30 reps - 2 hold - Seated Hip Flexion March with Ankle Weights  - 1 x daily - 7 x weekly - 2 sets - 10 reps - 5 hold - Standing Hip Abduction with Ankle Weight  - 1 x daily - 7 x weekly - 2 sets - 10 reps - 5 hold - Standing Hip Extension with Ankle Weight  - 1 x daily - 7 x weekly - 2 sets - 10 reps - 5 hold - Standing Hip Flexion with Ankle Weight  - 1 x daily - 7 x weekly - 2 sets - 10 reps - 5  hold - Seated Long Arc Quad with Ankle Weight  - 1 x daily - 7 x weekly - 2 sets - 10 reps - 5 hold Access Code: [email or web address] URL: [email or web address] Date: 08/22/2023 Prepared by: Marina  Moser  Exercises - Seated Finger Tip Pinch with Putty  - 1 x daily - 7 x weekly - 2 sets - 10 reps - 5 hold - Hand Squeezes  - 1 x daily - 7 x weekly - 2 sets - 10 reps - 5 hold - Key Pinch with Putty  - 1 x  daily - 7 x weekly - 2 sets - 10 reps - 5 hold  GOALS: Goals reviewed with patient? Yes  SHORT TERM GOALS: Target date: 05/09/2023  Patient will be independent in home exercise program to improve strength/mobility for better functional independence with ADLs. Baseline: 2/21: HEP give next session 5/15 : intermittent compliance 8/22: intermittent compliance due to illness 11/14: compliant  Goal status: MET    LONG TERM GOALS: Target date: 04/09/24  Patient will increase FOTO score to equal to or greater than  64%   to demonstrate statistically significant improvement in mobility and quality of life.  Baseline: 2/21: 54% 8/22: 52% 10/17: 57% 11/14: 53% 1/16: 47% Goal status: terminated  2.  Patient will increase Berg Balance score by > 6 points (42/56)to demonstrate decreased fall risk during functional activities. Baseline: 2/21: 36/56 5/15: 45   Goal status: MET  3.  Patient will increase six minute walk test distance to >1500 for progression to age norm community ambulator and improve gait ability Baseline: 2/21: 1200 ft with [facility] 5/15: 1300 ft with [facility]  8/22: 905 ft with [facility] 10/17: 1100 ft with [facility] 11/14: 1110 ft with [facility] 1/16: 1210 ft with [facility] 3/6: 1100 ft with two canes 5/29: 1195 ft with [facility] 6/19: defer due to patient bringing crutches in today Goal status: In progress  4.  Patient will increase BLE gross strength to 4+/5  or within 1 lb of each LE as to improve functional strength for independent gait, increased standing tolerance and increased ADL ability. Baseline: 2/21  :see above 5/15: see above 10/17: RLE 5/5 LLE 4/5 L ankle 3/5  11/14: RLE 5/5 LLE 4+/5 hamstring 4/5, ankle: 3+/5 1/16: RLE 5/5 LLE 4+/5 ankle 4-/5 3/6: see above 5/29: RLE 5/5 ankle 4-/5 LLE 4+/5 ankle 4-/5 6/19: L df 3-, pf 3; R df 3+, pf 4- Goal status:  In progress / modified  6.  Patient will increase Berg Balance score by > 6 points (51/56) to demonstrate decreased fall risk during functional activities. Baseline:  5/15: 45 8/22: 41/56 10/17: 28 11/14: 36 1/16: 36 3/6: 25 5/29: 34/56  6/19: 39/56  Goal status: Ongoing  7.   Patient will increase UE strength to >4+/5 for ADLs, AD usage, and quality of life.  Baseline: 5/15: see above 8/22: see above 11/14: >4+/5 bilaterally  Goal status: MET  8.   Patient will increase lower extremity functional scale to >60/80 to demonstrate improved functional mobility and increased tolerance with ADLs.  Baseline: 11/14: 29%  1/16: 29%  3/6: 19%  5/29: 36  6/19: 14/80 Goal status: ongoing   ASSESSMENT:  CLINICAL IMPRESSION:    Pt responded well to the exercises today and was much more receptive to the education provided.  Pt performed well with the exercises that were targeting the ankle musculature that is necessary for more normalized gait pattern.   Pt will continue to benefit from skilled therapy to address remaining deficits in order to improve overall QoL and return to PLOF.      OBJECTIVE IMPAIRMENTS: Abnormal gait, decreased activity tolerance, decreased balance, decreased coordination, decreased endurance, decreased mobility, difficulty walking, decreased ROM, decreased strength, hypomobility, impaired flexibility, improper body mechanics, and postural dysfunction.   ACTIVITY LIMITATIONS: carrying, lifting, bending, standing, squatting, stairs, transfers, reach over head, locomotion level, and caring for others  PARTICIPATION LIMITATIONS: meal prep, cleaning, laundry, personal finances, driving, shopping, community activity, and yard  work  PERSONAL FACTORS: Age, Past/current experiences, Time since onset of  injury/illness/exacerbation, and 3+ comorbidities: DHD, arthritis, concussion COVID, HTN, multiple lacunar infarcts, MVP, pernicious anemia, rocky mountain spotted fever, spinal stenosis, vestibular migraine, vitamin B deficiency.  are also affecting patient's functional outcome.   REHAB POTENTIAL: Good  CLINICAL DECISION MAKING: Evolving/moderate complexity  EVALUATION COMPLEXITY: Moderate  PLAN:   PT FREQUENCY: 1x/week  PT DURATION: 12 weeks  PLANNED INTERVENTIONS: Therapeutic exercises, Therapeutic activity, Neuromuscular re-education, Balance training, Gait training, Patient/Family education, Self Care, Joint mobilization, Stair training, Vestibular training, Canalith repositioning, Visual/preceptual remediation/compensation, Orthotic/Fit training, DME instructions, Cognitive remediation, Electrical stimulation, Spinal mobilization, Cryotherapy, Moist heat, Splintting, Taping, Traction, Ultrasound, Manual therapy, and Re-evaluation  PLAN FOR NEXT SESSION:   balance, strength, unstable surfaces   Fonda Simpers, PT, DPT Physical Therapist - [facility]  [facility]  04/02/24, 5:35 PM

## 2024-04-07 ENCOUNTER — Encounter: Payer: Self-pay | Admitting: Neurosurgery

## 2024-04-09 ENCOUNTER — Ambulatory Visit

## 2024-04-09 ENCOUNTER — Ambulatory Visit: Admitting: Occupational Therapy

## 2024-04-09 ENCOUNTER — Telehealth: Payer: Self-pay

## 2024-04-09 DIAGNOSIS — M4310 Spondylolisthesis, site unspecified: Secondary | ICD-10-CM

## 2024-04-09 DIAGNOSIS — M5416 Radiculopathy, lumbar region: Secondary | ICD-10-CM

## 2024-04-09 DIAGNOSIS — M62561 Muscle wasting and atrophy, not elsewhere classified, right lower leg: Secondary | ICD-10-CM

## 2024-04-09 NOTE — Telephone Encounter (Signed)
 Message sent to Dr Marea and Dr Jama regarding their availability for surgery dates.

## 2024-04-10 ENCOUNTER — Other Ambulatory Visit: Payer: Self-pay

## 2024-04-10 ENCOUNTER — Telehealth: Payer: Self-pay | Admitting: *Deleted

## 2024-04-10 DIAGNOSIS — M4310 Spondylolisthesis, site unspecified: Secondary | ICD-10-CM

## 2024-04-10 DIAGNOSIS — Z01818 Encounter for other preprocedural examination: Secondary | ICD-10-CM

## 2024-04-10 DIAGNOSIS — M5416 Radiculopathy, lumbar region: Secondary | ICD-10-CM

## 2024-04-10 DIAGNOSIS — M62561 Muscle wasting and atrophy, not elsewhere classified, right lower leg: Secondary | ICD-10-CM

## 2024-04-10 NOTE — Telephone Encounter (Signed)
   Pre-operative Risk Assessment    Patient Name: [name]  DOB: April 20, 1961 MRN: [id number]   Date of last office visit: 01/20/24 BARNIE HILA, NP Date of next office visit: 04/22/24 BARNIE HILA, NP    Request for Surgical Clearance    Procedure:  L5-S1 anterior lumbar interbody fusion (ALIF) and posterior spinal fusion/general anesthesia  Date of Surgery:  Clearance 06/10/24                                Surgeon:  DR. REEVES DAISY Surgeon's Group or Practice Name:  [facility] NEUROSURGERY AT [place] Phone number:  [phone] Fax number:  [phone]; CY FRESHWATER, RN   Type of Clearance Requested:   - Medical  - Pharmacy:  Hold Aspirin ( Aspirin 81mg :  OK to stay on aspirin 81mg ); (Aspirin 325mg :   stop aspirin 325mg  7 days prior, can resume aspirin 325mg  14 days after )   Type of Anesthesia:  General    Additional requests/questions:    [name]   04/10/2024, 10:19 AM   Letter  Jean, Kendelyn, RN on 04/10/2024     [facility] Neurosurgery at [facility] [address] [place], [place]  [address] Phone:  [phone]   Fax:  [phone]     Patient: [name]                DOB: May 30, 1961   Date sent: 04/10/2024             Faxed to: Darice Petty, NP  &  Davene Nicolas   Surgeon: REEVES DAISY, MD   Date of procedure: 06/10/2024   Planned procedure & Anesthesia Type: L5-S1 anterior lumbar interbody fusion (ALIF) and posterior spinal fusion/general anesthesia   Should you choose to see this patient in your office to provide clearance, please ask your office staff to contact the patient directly.  Please fax your evaluation and any supporting documentation as soon as completed.  Thank you! Your assistance is greatly appreciated!     Note: We have instructed patient to hold the following medications for the procedure as listed below:   Aspirin 81mg :  OK to stay on aspirin  81mg  Aspirin 325mg :   stop aspirin 325mg  7 days prior, can resume aspirin 325mg  14 days after        Is this patient optimized to have the above procedure?    Yes   or   No   If NO, please indicate further studies/evaluations recommended     Risk:    Low    Moderate    High     Remarks:      Physician Name: _______________________________      Physician Signature: _______________________________ Date:________________   [facility] Neurosurgery at [facility] [place], [id number]

## 2024-04-10 NOTE — Telephone Encounter (Signed)
 Pt has appt 04/22/24 with Barnie Hila, NP. Will update appt notes to reflect need preop clearance.

## 2024-04-10 NOTE — Telephone Encounter (Signed)
   Name: [name]  DOB: 07/10/1961  MRN: [id number]  Primary Cardiologist: Lonni Hanson, MD  Chart reviewed as part of pre-operative protocol coverage. The patient has an upcoming visit scheduled with Adrien Hila  on 04/22/2024 at which time clearance can be addressed in case there are any issues that would impact surgical recommendations.  5-S1 anterior lumbar interbody fusion (ALIF) and posterior spinal fusion/general anesthesia Is not scheduled until 06/10/2024 as below. I added preop FYI to appointment note so that provider is aware to address at time of outpatient visit.  Per office protocol the cardiology provider should forward their finalized clearance decision and recommendations regarding antiplatelet therapy to the requesting party below.     I will route this message as FYI to requesting party and remove this message from the preop box as separate preop APP input not needed at this time.   Please call with any questions.  Lamarr Satterfield, NP  04/10/2024, 10:33 AM

## 2024-04-10 NOTE — Telephone Encounter (Signed)
 Planned surgery: L5-S1 anterior lumbar interbody fusion (ALIF) and posterior spinal fusion.  Patient information brochure sent via mychart.   Surgery date: 06/10/2024 at [facility], [place] ([facility]: [address], [place], [place] [address]) - you will find out your arrival time the business day before your surgery.   Pre-op appointment at [facility] Pre-admit Testing: you will receive a call with a date/time for this appointment. If you are scheduled for an in person appointment, Pre-admit Testing is located on the first floor of the [facility] building, [address], [address]. During this appointment, they will advise you which medications you can take the morning of surgery, and which medications you will need to hold for surgery. Labs (such as blood work, EKG) may be done at your pre-op appointment. You are not required to fast for these labs. Should you need to change your pre-op appointment, please call Pre-admit testing at [phone].     Blood thinners:   Aspirin 81mg :  OK to stay on aspirin 81mg  Aspirin 325mg :   stop aspirin 325mg  7 days prior, can resume aspirin 325mg  14 days after     Surgical clearance: we will send a clearance form to Darice Petty, NP (primary care) and [facility]. They may wish to see you in their office prior to signing the clearance form. If so, they may call you to schedule an appointment.     Brace: You will need to bring the brace to the hospital on the day of surgery. [facility] will contact you regarding an appointment for the brace you will use after surgery. If it is getting close to your surgery date and you have not received an appointment with [facility], please reach out to us .  Their number is [phone] (for the [place] location) should you miss their call or have an issue with your brace after surgery.     NSAIDS (Non-steroidal anti-inflammatory drugs): because you are having a fusion,  please avoid taking any NSAIDS (examples: ibuprofen , motrin , aleve, naproxen, meloxicam, diclofenac) for 3 months after surgery. Celebrex  is an exception and is OK to take, if prescribed. Tylenol  is not an NSAID.    Common restrictions after spine surgery: No bending, lifting, or twisting ("BLT"). Avoid lifting objects heavier than 10 pounds for the first 6 weeks after surgery. Where possible, avoid household activities that involve lifting, bending, reaching, pushing, or pulling such as laundry, vacuuming, grocery shopping, and childcare. Try to arrange for help from friends and family for these activities while you heal. Do not drive while taking prescription pain medication. Weeks 6 through 12 after surgery: avoid lifting more than 25 pounds.    X-rays after surgery: Because you are having a fusion: for appointments after your 2 week follow-up: please arrive at the [facility] outpatient imaging center ([address], [place]) or [employer] one hour prior to your appointment for x-rays. This applies to every appointment after your 2 week follow-up. Failure to do so may result in your appointment being rescheduled. *We recently started construction to have x-ray in our office. This may be completed by the time you come in for your 6 week post-op appointment. Please check with us  closer to that time to see if you can have your x-rays at our office*    How to contact us :  If you have any questions/concerns before or after surgery, you can reach us  at [phone], or you can send a mychart message. We  can be reached by phone or mychart 8am-4pm, Monday-Friday.  *Please note: Calls after 4pm are forwarded to a third party answering service. Mychart messages are not routinely monitored during evenings, weekends, and holidays. Please call our office to contact the answering service for urgent concerns during non-business hours.   If you have FMLA/disability paperwork,  please drop it off or fax it to [phone]   Appointments/FMLA & disability paperwork: [employer] Registered Nurse/Surgery scheduler: Aristotle Lieb, RN Certified Medical Assistants: Don, CMA, Elenor, CMA, & Damien, CMA Physician Assistants: Lyle Decamp, PA-C, Edsel Goods, PA-C & Glade Boys, PA-C Surgeons: Penne Sharps, MD & Reeves Daisy, MD   [facility] PREADMIT TESTING VISIT and SURGERY INFORMATION SHEET   Now that surgery has been scheduled you can anticipate several phone calls from [facility] services. A pharmacy technician will call you to verify your current list of medications taken at home.               The Pre-Service Center will call to verify your insurance information and to give you billing estimates and information.             The Preadmit Testing Office will be calling to schedule a visit to obtain information for the anesthesia team and provide instructions on preparation for surgery.  What can you expect for the Preadmit Testing Visit: Appointments may be scheduled in-person or by telephone.  If a telephone visit is scheduled, you may be asked to come into the office to have lab tests or other studies performed.   This visit will not be completed any greater than 14 days prior to your surgery.  If your surgery has been scheduled for a future date, please do not be alarmed if we have not contacted you to schedule an appointment more than a month prior to the surgery date.    Please be prepared to provide the following information during this appointment:            -Personal medical history                                               -Medication and allergy list            -Any history of problems with anesthesia              -Recent lab work or diagnostic studies            -Please notify us  of any needs we should be aware of to provide the best care possible           -You will be provided with instructions on how to prepare  for your surgery.    On The Day of Surgery:  You must have a driver to take you home after surgery, you will be asked not to drive for 24 hours following surgery.  Taxi, Gisele and non-medical transport will not be acceptable means of transportation unless you have a responsible individual who will be traveling with you.  Visitors in the surgical area:   2 people will be able to visit you in your room once your preparation for surgery has been completed. During surgery, your visitors will be asked to wait in the Surgery Waiting Area.  It is not a requirement for them to stay, if they prefer to leave and  come back.  Your visitor(s) will be given an update once the surgery has been completed.  No visitors are allowed in the initial recovery room to respect patient privacy and safety.  Once you are more awake and transfer to the secondary recovery area, or are transferred to an inpatient room, visitors will again be able to see you.  To respect and protect your privacy: We will ask on the day of surgery who your driver will be and what the contact number for that individual will be. We will ask if it is okay to share information with this individual, or if there is an alternative individual that we, or the surgeon, should contact to provide updates and information. If family or friends come to the surgical information desk requesting information about you, who you have not listed with us , no information will be given.   It may be helpful to designate someone as the main contact who will be responsible for updating your other friends and family.    PREADMIT TESTING OFFICE: [phone] SAME DAY SURGERY: [phone] We look forward to caring for you before and throughout the process of your surgery.

## 2024-04-13 ENCOUNTER — Telehealth: Payer: Self-pay

## 2024-04-13 NOTE — Telephone Encounter (Signed)
 Form received from [facility]. Patient is scheduled for 1022/2025 for surgery. Last visit was in 01/2024. Please schedule visit as close to 05/11/2024 but not prior as needs paperwork signed within 30 days of surgery.

## 2024-04-14 NOTE — Telephone Encounter (Signed)
 Scheduled

## 2024-04-16 ENCOUNTER — Ambulatory Visit: Admitting: Occupational Therapy

## 2024-04-16 ENCOUNTER — Ambulatory Visit

## 2024-04-16 DIAGNOSIS — M4316 Spondylolisthesis, lumbar region: Secondary | ICD-10-CM | POA: Diagnosis not present

## 2024-04-16 DIAGNOSIS — R4681 Obsessive-compulsive behavior: Secondary | ICD-10-CM | POA: Diagnosis not present

## 2024-04-16 DIAGNOSIS — F840 Autistic disorder: Secondary | ICD-10-CM | POA: Diagnosis not present

## 2024-04-16 DIAGNOSIS — F0670 Mild neurocognitive disorder due to known physiological condition without behavioral disturbance: Secondary | ICD-10-CM | POA: Diagnosis not present

## 2024-04-18 NOTE — Progress Notes (Unsigned)
 Cardiology Clinic Note   Date: 04/22/2024 ID: [name], DOB Feb 09, 1961, MRN [id number]  Primary Cardiologist:  Lonni Hanson, MD  Chief Complaint   [name] is a 63 y.o. male who presents to the clinic today for routine follow up.   Patient Profile   [name] is followed by Dr. Hanson for the history outlined below.       Past medical history significant for: Hypertension. Renal artery ultrasound 01/08/2024: Normal size kidneys bilaterally.  Normal resistive index, cortical thickness bilaterally.  No evidence of renal artery stenosis bilaterally.  Cyst noted in the right mid low pole 1 x 1.4 x 1 cm.  Normal celiac and superior mesenteric artery.  IVC patent. Echo 01/15/2024: EF 60 to 65%.  No RWMA.  Grade I DD.  Normal RV size/function.  Mitral valve is normal with no evidence of MR. Lacunar infarcts. MRI brain 10/29/2023: No acute intracranial abnormality.  Findings of chronic small vessel ischemia. Carotid duplex 01/15/2024: Extracranial vessels were near normal with only minimal wall thickening or plaque bilaterally.  Antegrade flow of bilateral vertebral arteries and normal bilateral subclavian arteries. [facility] spotted fever. Spinal stenosis.  In summary, patient was first evaluated by Dr. Hanson on 12/11/2023 for hypertension and elevated renin levels.  He was seen by nephrology and switch from lisinopril  to amlodipine  with normalization of renin levels.  Patient and wife reported BP control not as good on amlodipine .  He had recently been started on propranolol  by neurologist for essential tremor.  Since starting on propranolol  he noticed changes in his skin, particularly the scalp as well as an unpleasant body odor.  Patient had been seen in the ED in March 2025 for severely elevated BP.  He had developed a vestibular migraine manifesting as sudden dizziness and checked his blood pressure at home noting it to be ~200/100.  Brain MRI showed findings of  chronic small vessel ischemia but no acute intracranial abnormality.  Plan for renal ultrasound, echo, carotid Dopplers for further evaluation.  He was instructed to hold propranolol  to see if side effects resolve and speak with neurology concerning treatment for his tremor.   Patient was last seen in the office by me on 01/20/2024 for follow-up after medication changes.  He reported BP continued to be elevated getting as high as 170/108 in the evenings.  At the time of his visit BP was 158/86 on intake and 140/86 on recheck.  He was started on olmesartan .     History of Present Illness    Today, patient is accompanied by his wife. He is doing well from a cardiac standpoint. Patient denies shortness of breath, dyspnea on exertion, lower extremity edema, orthopnea or PND. No chest pain, pressure, or tightness. No palpitations.  His BP has been better controlled with the addition of olmesartan , however he has developed a dry cough similar to when he was on lisinopril . He has been treating cough with over the counter coricidin but still has breakthrough coughing particularly at night. He is pending lumbar surgery with Dr. Katrina.       ROS: All other systems reviewed and are otherwise negative except as noted in History of Present Illness.  EKGs/Labs Reviewed    EKG Interpretation Date/Time:  Wednesday April 22 2024 11:26:49 EDT Ventricular Rate:  96 PR Interval:  138 QRS Duration:  88 QT Interval:  340 QTC Calculation: 429 R Axis:   85  Text Interpretation: Normal sinus rhythm Normal ECG When compared with  ECG of 11-Dec-2023 09:53, No significant change Confirmed by Loistine Sober [phone]) on 04/22/2024 11:30:07 AM   01/20/2024: ALT 13; AST 18 02/03/2024: BUN 19; Creatinine, Ser 0.79; Potassium 4.5; Sodium 140   01/20/2024: Hemoglobin 15.5; WBC 6.6   07/11/2023: TSH 0.933    Physical Exam    VS:  BP 128/84 (BP Location: Left Arm, Patient Position: Sitting, Cuff Size: Normal)    Pulse 96   Ht 5' 7 (1.702 m)   Wt 132 lb 12.8 oz (60.2 kg)   SpO2 98%   BMI 20.80 kg/m  , BMI Body mass index is 20.8 kg/m.  GEN: Well nourished, well developed, in no acute distress. Neck: No JVD or carotid bruits. Cardiac:  RRR.  No murmur. No rubs or gallops.   Respiratory:  Respirations regular and unlabored. Clear to auscultation without rales, wheezing or rhonchi. GI: Soft, nontender, nondistended. Extremities: Radials/DP/PT 2+ and equal bilaterally. No clubbing or cyanosis. No edema.  Skin: Warm and dry, no rash. Neuro: Strength intact.  Assessment & Plan   Hypertension Renal artery ultrasound May 2025 demonstrated normal-sized kidneys bilaterally with no evidence of renal artery stenosis.  BP today 128/84. Patient has developed a dry cough similar to when he was on lisinopril . He has been taking over the counter coricidin which helps a little bit but he still has breakthrough coughing particularly at night. We discussed holding olmesartan  to see if cough resolves and they are agreeable.  - Hold olmesartan .  - MyChart readings if BP begins trending up.  - Continue amlodipine  and hydrochlorothiazide .   Lacunar infarcts MRI March 2025 with findings of chronic small vessel ischemia.  Carotid duplex May 2025 showed extracranial vessels were near normal with only minimal wall thickening or plaque bilaterally.  Patient is concerned about starting a statin secondary to side effects. He would like to focus on diet and exercise.  - Continue aspirin. - Continue to follow a Mediterranean diet and increase physical activity as tolerated.  Preoperative cardiovascular risk assessment  L5-S1 anterior lumbar interbody fusion and posterior spinal fusion by Dr. Katrina.  According to the RCRI, patient has a 0.9% risk of MACE. Patient reports activity equivalent to 4.62 METS (per [employer]ASI).  -Based on ACC/AHA guidelines, JAISEAN MONTEFORTE would be at acceptable risk for the planned procedure  without further cardiovascular testing.   Disposition: Hold olmesartan . MyChart BP readings if >130/80. Return in 5 months or sooner as needed.          Signed, Sober HERO. Trevion Hoben, DNP, NP-C

## 2024-04-20 ENCOUNTER — Encounter: Payer: Self-pay | Admitting: Neurosurgery

## 2024-04-22 ENCOUNTER — Encounter: Payer: Self-pay | Admitting: Student

## 2024-04-22 ENCOUNTER — Encounter: Payer: Self-pay | Admitting: Nurse Practitioner

## 2024-04-22 ENCOUNTER — Ambulatory Visit: Attending: Student | Admitting: Student

## 2024-04-22 ENCOUNTER — Other Ambulatory Visit: Payer: Self-pay | Admitting: Nurse Practitioner

## 2024-04-22 ENCOUNTER — Ambulatory Visit (INDEPENDENT_AMBULATORY_CARE_PROVIDER_SITE_OTHER): Admitting: Nurse Practitioner

## 2024-04-22 VITALS — BP 124/72 | HR 113 | Temp 98.9°F | Ht 67.0 in | Wt 135.4 lb

## 2024-04-22 VITALS — BP 128/84 | HR 96 | Ht 67.0 in | Wt 132.8 lb

## 2024-04-22 DIAGNOSIS — R634 Abnormal weight loss: Secondary | ICD-10-CM | POA: Diagnosis not present

## 2024-04-22 DIAGNOSIS — R7303 Prediabetes: Secondary | ICD-10-CM

## 2024-04-22 DIAGNOSIS — E7800 Pure hypercholesterolemia, unspecified: Secondary | ICD-10-CM

## 2024-04-22 DIAGNOSIS — I6381 Other cerebral infarction due to occlusion or stenosis of small artery: Secondary | ICD-10-CM

## 2024-04-22 DIAGNOSIS — E538 Deficiency of other specified B group vitamins: Secondary | ICD-10-CM | POA: Diagnosis not present

## 2024-04-22 DIAGNOSIS — I10 Essential (primary) hypertension: Secondary | ICD-10-CM

## 2024-04-22 DIAGNOSIS — Z01810 Encounter for preprocedural cardiovascular examination: Secondary | ICD-10-CM | POA: Diagnosis not present

## 2024-04-22 MED ORDER — MIRTAZAPINE 30 MG PO TBDP: 30.0000 mg | ORAL_TABLET | Freq: Every day | ORAL | Status: DC

## 2024-04-22 MED ORDER — CYANOCOBALAMIN 1000 MCG/ML IJ SOLN
1000.0000 ug | Freq: Once | INTRAMUSCULAR | Status: AC
  Administered 2024-04-22: 1000 ug via INTRAMUSCULAR

## 2024-04-22 NOTE — Patient Instructions (Signed)
 Medication Instructions:  Your physician recommends the following medication changes.  STOP TAKING: Olmesartan  *If you need a refill on your cardiac medications before your next appointment, please call your pharmacy*  Lab Work: None ordered at this time  If you have labs (blood work) drawn today and your tests are completely normal, you will receive your results only by: MyChart Message (if you have MyChart) OR A paper copy in the mail If you have any lab test that is abnormal or we need to change your treatment, we will call you to review the results.  Testing/Procedures: None ordered at this time   Follow-Up: At [facility], you and your health needs are our priority.  As part of our continuing mission to provide you with exceptional heart care, our providers are all part of one team.  This team includes your primary Cardiologist (physician) and Advanced Practice Providers or APPs (Physician Assistants and Nurse Practitioners) who all work together to provide you with the care you need, when you need it.  Your next appointment:   5 month(s)  Provider:   Lonni Hanson, MD or Barnie Hila, NP    We recommend signing up for the patient portal called MyChart.  Sign up information is provided on this After Visit Summary.  MyChart is used to connect with patients for Virtual Visits (Telemedicine).  Patients are able to view lab/test results, encounter notes, upcoming appointments, etc.  Non-urgent messages can be sent to your provider as well.   To learn more about what you can do with MyChart, go to [email or web address].

## 2024-04-22 NOTE — Assessment & Plan Note (Signed)
 Chronic. Not well controlled.  Has lost 5lbs since last visit.  Will increase Mirtazipine to help with the appetite.  Follow up in 1 month.

## 2024-04-22 NOTE — Assessment & Plan Note (Signed)
 Labs ordered at visit today.  Will make recommendations based on lab results.

## 2024-04-22 NOTE — Assessment & Plan Note (Signed)
 Chronic.  Controlled.  Continue with current medication regimen.  Labs ordered today.  Return to clinic in 6 months for reevaluation.  Call sooner if concerns arise.  ? ?

## 2024-04-22 NOTE — Progress Notes (Signed)
 BP 124/72   Pulse (!) 113   Temp 98.9 F (37.2 C) (Other (Comment))   Ht 5' 7 (1.702 m)   Wt 135 lb 6.4 oz (61.4 kg)   SpO2 96%   BMI 21.21 kg/m    Subjective:    Patient ID: [name], male    DOB: 02/13/1961, 63 y.o.   MRN: [id number]  HPI: [name] is a 63 y.o. male  Chief Complaint  Patient presents with   Hypertension   Weight Loss   HYPERTENSION with Chronic Kidney Disease He is currently taking Amlodipine , HCTZ.  He was taken off the Olmesartan  due to cough.   Hypertension status: controlled Satisfied with current treatment? no Duration of hypertension: years BP monitoring frequency:  not checking BP range:  BP medication side effects:  no Medication compliance: excellent compliance Previous BP meds:none Aspirin: no Recurrent headaches: no Visual changes: no Palpitations: no Dyspnea: no Chest pain: no Lower extremity edema: no Dizzy/lightheaded: no   Patient has lost about 5lbs since last visit.  He does not eat regularly.  He has access to food but just isn't eating. Mirtazipine was started but hasn't made a difference.     ANXIETY Patient has had anxiety for most of his life.  Does not want to be on maintenance medication.  Only uses the lorazepam  PRN.  Does agree that something for his physical symptoms of shaking would be beneficial.  Relevant past medical, surgical, family and social history reviewed and updated as indicated. Interim medical history since our last visit reviewed. Allergies and medications reviewed and updated.  Review of Systems  Constitutional:  Positive for unexpected weight change.  Eyes:  Negative for visual disturbance.  Respiratory:  Negative for shortness of breath.   Cardiovascular:  Negative for chest pain and leg swelling.  Neurological:  Positive for tremors. Negative for light-headedness and headaches.  Psychiatric/Behavioral:  The patient is nervous/anxious.     Per HPI unless specifically  indicated above     Objective:    BP 124/72   Pulse (!) 113   Temp 98.9 F (37.2 C) (Other (Comment))   Ht 5' 7 (1.702 m)   Wt 135 lb 6.4 oz (61.4 kg)   SpO2 96%   BMI 21.21 kg/m   Wt Readings from Last 3 Encounters:  04/22/24 135 lb 6.4 oz (61.4 kg)  04/22/24 132 lb 12.8 oz (60.2 kg)  03/26/24 138 lb (62.6 kg)    Physical Exam Vitals and nursing note reviewed.  Constitutional:      General: He is not in acute distress.    Appearance: Normal appearance. He is not ill-appearing, toxic-appearing or diaphoretic.  HENT:     Head: Normocephalic.     Right Ear: External ear normal.     Left Ear: External ear normal.     Nose: Nose normal. No congestion or rhinorrhea.     Mouth/Throat:     Mouth: Mucous membranes are moist.  Eyes:     General:        Right eye: No discharge.        Left eye: No discharge.     Extraocular Movements: Extraocular movements intact.     Conjunctiva/sclera: Conjunctivae normal.     Pupils: Pupils are equal, round, and reactive to light.  Cardiovascular:     Rate and Rhythm: Normal rate and regular rhythm.     Heart sounds: No murmur heard. Pulmonary:     Effort: Pulmonary effort is normal.  No respiratory distress.     Breath sounds: Normal breath sounds. No wheezing, rhonchi or rales.  Abdominal:     General: Abdomen is flat. Bowel sounds are normal.  Musculoskeletal:     Cervical back: Normal range of motion and neck supple.  Skin:    General: Skin is warm and dry.     Capillary Refill: Capillary refill takes less than 2 seconds.  Neurological:     General: No focal deficit present.     Mental Status: He is alert and oriented to person, place, and time.  Psychiatric:        Mood and Affect: Mood normal.        Behavior: Behavior normal.        Thought Content: Thought content normal.        Judgment: Judgment normal.     Results for orders placed or performed in visit on 02/03/24  Basic metabolic panel with GFR   Collection Time:  02/03/24  2:55 PM  Result Value Ref Range   Glucose 106 (H) 70 - 99 mg/dL   BUN 19 8 - 27 mg/dL   Creatinine, Ser 9.20 0.76 - 1.27 mg/dL   eGFR 899 >40 fO/fpw/8.26   BUN/Creatinine Ratio 24 10 - 24   Sodium 140 134 - 144 mmol/L   Potassium 4.5 3.5 - 5.2 mmol/L   Chloride 99 96 - 106 mmol/L   CO2 22 20 - 29 mmol/L   Calcium 10.0 8.6 - 10.2 mg/dL      Assessment & Plan:   Problem List Items Addressed This Visit       Nervous and Auditory   Lacunar stroke (HCC)   Chronic.  Controlled.  Continue with current medication regimen.  Labs ordered today.  Return to clinic in 6 months for reevaluation.  Call sooner if concerns arise.        Relevant Orders   Comprehensive metabolic panel with GFR     Other   Prediabetes   Labs ordered at visit today.  Will make recommendations based on lab results.        Relevant Orders   Comprehensive metabolic panel with GFR   Hemoglobin A1c   B12 deficiency   Labs ordered at visit today.  Will make recommendations based on lab results.       Relevant Medications   cyanocobalamin  (VITAMIN B12) injection 1,000 mcg   Other Relevant Orders   Comprehensive metabolic panel with GFR   B12   Elevated LDL cholesterol level   Chronic.  Controlled.  Continue with current medication regimen.  Labs ordered today.  Return to clinic in 6 months for reevaluation.  Call sooner if concerns arise.        Relevant Orders   Comprehensive metabolic panel with GFR   Lipid panel   Unintentional weight loss - Primary   Chronic. Not well controlled.  Has lost 5lbs since last visit.  Will increase Mirtazipine to help with the appetite.  Follow up in 1 month.       Relevant Orders   Comprehensive metabolic panel with GFR   CBC With Diff/Platelet     Follow up plan: No follow-ups on file.

## 2024-04-22 NOTE — Progress Notes (Deleted)
 There were no vitals taken for this visit.   Subjective:    Patient ID: [name], male    DOB: 03-30-1961, 63 y.o.   MRN: [id number]  HPI: [name] is a 63 y.o. male  No chief complaint on file.  HYPERTENSION with Chronic Kidney Disease He is currently taking Amlodipine , HCTZ.  Started on Olmesartan  this morning with Cardiology.  Hypertension status: controlled Satisfied with current treatment? no Duration of hypertension: years BP monitoring frequency:  not checking BP range:  BP medication side effects:  no Medication compliance: excellent compliance Previous BP meds:none Aspirin: no Recurrent headaches: no Visual changes: no Palpitations: no Dyspnea: no Chest pain: no Lower extremity edema: no Dizzy/lightheaded: no   Patient has not gained any weight.  He does not have an appetite and doesn't eat a lot at home.  He is drinking protein drinks   ANXIETY Patient has had anxiety for most of his life.  Does not want to be on maintenance medication.  Only uses the lorazepam  PRN.  Does agree that something for his physical symptoms of shaking would be beneficial.   Relevant past medical, surgical, family and social history reviewed and updated as indicated. Interim medical history since our last visit reviewed. Allergies and medications reviewed and updated.  Review of Systems  Constitutional:  Negative for unexpected weight change.  Eyes:  Negative for visual disturbance.  Respiratory:  Negative for shortness of breath.   Cardiovascular:  Negative for chest pain and leg swelling.  Neurological:  Negative for light-headedness and headaches.  Psychiatric/Behavioral:  The patient is nervous/anxious.     Per HPI unless specifically indicated above     Objective:    There were no vitals taken for this visit.  Wt Readings from Last 3 Encounters:  04/22/24 132 lb 12.8 oz (60.2 kg)  03/26/24 138 lb (62.6 kg)  03/17/24 138 lb 12.8 oz (63 kg)     Physical Exam Vitals and nursing note reviewed.  Constitutional:      General: He is not in acute distress.    Appearance: Normal appearance. He is not ill-appearing, toxic-appearing or diaphoretic.  HENT:     Head: Normocephalic.     Right Ear: External ear normal.     Left Ear: External ear normal.     Nose: Nose normal. No congestion or rhinorrhea.     Mouth/Throat:     Mouth: Mucous membranes are moist.  Eyes:     General:        Right eye: No discharge.        Left eye: No discharge.     Extraocular Movements: Extraocular movements intact.     Conjunctiva/sclera: Conjunctivae normal.     Pupils: Pupils are equal, round, and reactive to light.  Cardiovascular:     Rate and Rhythm: Normal rate and regular rhythm.     Heart sounds: No murmur heard. Pulmonary:     Effort: Pulmonary effort is normal. No respiratory distress.     Breath sounds: Normal breath sounds. No wheezing, rhonchi or rales.  Abdominal:     General: Abdomen is flat. Bowel sounds are normal.  Musculoskeletal:     Cervical back: Normal range of motion and neck supple.  Skin:    General: Skin is warm and dry.     Capillary Refill: Capillary refill takes less than 2 seconds.  Neurological:     General: No focal deficit present.     Mental Status: He is alert  and oriented to person, place, and time.     Comments: tremor  Psychiatric:        Mood and Affect: Mood normal.        Behavior: Behavior normal.        Thought Content: Thought content normal.        Judgment: Judgment normal.     Results for orders placed or performed in visit on 02/03/24  Basic metabolic panel with GFR   Collection Time: 02/03/24  2:55 PM  Result Value Ref Range   Glucose 106 (H) 70 - 99 mg/dL   BUN 19 8 - 27 mg/dL   Creatinine, Ser 9.20 0.76 - 1.27 mg/dL   eGFR 899 >40 fO/fpw/8.26   BUN/Creatinine Ratio 24 10 - 24   Sodium 140 134 - 144 mmol/L   Potassium 4.5 3.5 - 5.2 mmol/L   Chloride 99 96 - 106 mmol/L   CO2 22 20  - 29 mmol/L   Calcium 10.0 8.6 - 10.2 mg/dL      Assessment & Plan:   Problem List Items Addressed This Visit       Cardiovascular and Mediastinum   Essential hypertension - Primary   Chronic.  Controlled.  Continue with current medication regimen of Amlodipine .  Labs ordered today.  Return to clinic in 3 months for reevaluation.  Call sooner if concerns arise.        Relevant Medications   propranolol  (INDERAL ) 10 MG tablet   Other Visit Diagnoses       Anxiety       Will start Propranolol . Discussed with patient about low HR. Typical HR is 60-80s for patient. Encouraged him to increase calorie intake. Has an appointment upcoming with Cardiology. Can consider decreasing dose of Amlodipine  if BP is soft.  Follow up in 2 months.         Follow up plan: No follow-ups on file.

## 2024-04-23 ENCOUNTER — Ambulatory Visit: Payer: Self-pay | Admitting: Nurse Practitioner

## 2024-04-23 ENCOUNTER — Ambulatory Visit: Attending: Neurology

## 2024-04-23 ENCOUNTER — Ambulatory Visit: Admitting: Occupational Therapy

## 2024-04-23 DIAGNOSIS — M6281 Muscle weakness (generalized): Secondary | ICD-10-CM | POA: Diagnosis not present

## 2024-04-23 DIAGNOSIS — R278 Other lack of coordination: Secondary | ICD-10-CM | POA: Diagnosis not present

## 2024-04-23 DIAGNOSIS — R262 Difficulty in walking, not elsewhere classified: Secondary | ICD-10-CM | POA: Diagnosis not present

## 2024-04-23 DIAGNOSIS — R2681 Unsteadiness on feet: Secondary | ICD-10-CM | POA: Insufficient documentation

## 2024-04-23 LAB — HEMOGLOBIN A1C
Est. average glucose Bld gHb Est-mCnc: 117 mg/dL
Hgb A1c MFr Bld: 5.7 % — ABNORMAL HIGH (ref 4.8–5.6)

## 2024-04-23 LAB — LIPID PANEL
Chol/HDL Ratio: 2.6 ratio (ref 0.0–5.0)
Cholesterol, Total: 181 mg/dL (ref 100–199)
HDL: 69 mg/dL (ref >39)
LDL Chol Calc (NIH): 85 mg/dL (ref 0–99)
Triglycerides: 163 mg/dL — ABNORMAL HIGH (ref 0–149)
VLDL Cholesterol Cal: 27 mg/dL (ref 5–40)

## 2024-04-23 LAB — CBC WITH DIFF/PLATELET
Basophils Absolute: 0 x10E3/uL (ref 0.0–0.2)
Basos: 0 %
EOS (ABSOLUTE): 0 x10E3/uL (ref 0.0–0.4)
Eos: 0 %
Hematocrit: 43.3 % (ref 37.5–51.0)
Hemoglobin: 14.8 g/dL (ref 13.0–17.7)
Immature Grans (Abs): 0 x10E3/uL (ref 0.0–0.1)
Immature Granulocytes: 0 %
Lymphocytes Absolute: 2 x10E3/uL (ref 0.7–3.1)
Lymphs: 16 %
MCH: 31.7 pg (ref 26.6–33.0)
MCHC: 34.2 g/dL (ref 31.5–35.7)
MCV: 93 fL (ref 79–97)
Monocytes Absolute: 0.7 x10E3/uL (ref 0.1–0.9)
Monocytes: 6 %
Neutrophils Absolute: 9.4 x10E3/uL — ABNORMAL HIGH (ref 1.4–7.0)
Neutrophils: 78 %
Platelets: 295 x10E3/uL (ref 150–450)
RBC: 4.67 x10E6/uL (ref 4.14–5.80)
RDW: 12.1 % (ref 11.6–15.4)
WBC: 12.1 x10E3/uL — ABNORMAL HIGH (ref 3.4–10.8)

## 2024-04-23 LAB — COMPREHENSIVE METABOLIC PANEL WITH GFR
ALT: 14 IU/L (ref 0–44)
AST: 18 IU/L (ref 0–40)
Albumin: 4 g/dL (ref 3.9–4.9)
Alkaline Phosphatase: 73 IU/L (ref 44–121)
BUN/Creatinine Ratio: 16 (ref 10–24)
BUN: 15 mg/dL (ref 8–27)
Bilirubin Total: 0.7 mg/dL (ref 0.0–1.2)
CO2: 23 mmol/L (ref 20–29)
Calcium: 9.3 mg/dL (ref 8.6–10.2)
Chloride: 100 mmol/L (ref 96–106)
Creatinine, Ser: 0.91 mg/dL (ref 0.76–1.27)
Globulin, Total: 2.3 g/dL (ref 1.5–4.5)
Glucose: 114 mg/dL — ABNORMAL HIGH (ref 70–99)
Potassium: 4.2 mmol/L (ref 3.5–5.2)
Sodium: 137 mmol/L (ref 134–144)
Total Protein: 6.3 g/dL (ref 6.0–8.5)
eGFR: 95 mL/min/1.73 (ref >59)

## 2024-04-23 LAB — VITAMIN B12: Vitamin B-12: 1755 pg/mL — ABNORMAL HIGH (ref 232–1245)

## 2024-04-23 MED ORDER — MIRTAZAPINE 30 MG PO TBDP: 30.0000 mg | ORAL_TABLET | Freq: Every day | ORAL | 1 refills | Status: AC

## 2024-04-23 NOTE — Telephone Encounter (Signed)
 Requested by interface surescripts. Medication dose discontinued 04/22/24.  Requested Prescriptions  Refused Prescriptions Disp Refills   mirtazapine  (REMERON  SOL-TAB) 15 MG disintegrating tablet [Pharmacy Med Name: MIRTAZAPINE  15 MG ODT] 30 tablet 2    Sig: TAKE 1 TABLET BY MOUTH EVERYDAY AT BEDTIME     Psychiatry: Antidepressants - mirtazapine  Passed - 04/23/2024  2:10 PM      Passed - Valid encounter within last 6 months    Recent Outpatient Visits           Yesterday Unintentional weight loss   [place] [facility] Melvin Pao, NP   2 months ago Multiple falls   [place] [facility] Melvin Pao, NP   3 months ago Prediabetes   [place] [facility] Melvin Pao, NP   5 months ago Essential hypertension   [place] [facility] Melvin Pao, NP   5 months ago Vitamin D  deficiency   [place] [facility] Melvin Pao, NP       Future Appointments             In 2 months Melvin Pao, NP [facility], [address]

## 2024-04-23 NOTE — Therapy (Signed)
 OUTPATIENT PHYSICAL THERAPY NEURO TREATMENT/RE-CERT/DISCHARGE   Patient Name: [name] MRN: [id number] DOB:11/03/1960, 63 y.o., male Today's Date: 04/23/2024   PCP: [name] Pao NP REFERRING PROVIDER: Gregory Edsel Ruth PA  END OF SESSION:  PT End of Session - 04/23/24 1405     Visit Number 46    Number of Visits 50    Date for PT Re-Evaluation 04/09/24    Authorization Type eval 2/21    PT Start Time 1405    PT Stop Time 1434    PT Time Calculation (min) 29 min    Equipment Utilized During Treatment Gait belt    Activity Tolerance Patient tolerated treatment well    Behavior During Therapy [facility] for tasks assessed/performed          Past Medical History:  Diagnosis Date   ADHD    Arthritis    Concussion    COVID 12/2020   Hypertension    Multiple lacunar infarcts (HCC)    MVP (mitral valve prolapse)    Pernicious anemia    Rocky Mountain spotted fever    Spinal stenosis    Stroke [facility])    Vestibular migraine    Vitamin B12 deficiency    Vitamin D  deficiency    Past Surgical History:  Procedure Laterality Date   ANTERIOR CERVICAL DECOMP/DISCECTOMY FUSION N/A 07/30/2022   Procedure: C4-7 ANTERIOR CERVICAL DISCECTOMY AND FUSION (GLOBUS HEDRON);  Surgeon: [name] Fret, MD;  Location: [facility] ORS;  Service: Neurosurgery;  Laterality: N/A;   BILATERAL CARPAL TUNNEL RELEASE     CERVICAL WOUND DEBRIDEMENT N/A 08/01/2022   Procedure: CERVICAL WOUND DEBRIDEMENT OF HEMATOMA;  Surgeon: [name] Fret, MD;  Location: [facility] ORS;  Service: Neurosurgery;  Laterality: N/A;   COLONOSCOPY     COLONOSCOPY WITH PROPOFOL  N/A 12/17/2023   Procedure: COLONOSCOPY WITH PROPOFOL ;  Surgeon: [name] Ole DASEN, MD;  Location: [facility] ENDOSCOPY;  Service: Endoscopy;  Laterality: N/A;   ESOPHAGOGASTRODUODENOSCOPY (EGD) WITH PROPOFOL  N/A 12/17/2023   Procedure: ESOPHAGOGASTRODUODENOSCOPY (EGD) WITH PROPOFOL ;  Surgeon: [name] Ole DASEN, MD;  Location: [facility] ENDOSCOPY;   Service: Endoscopy;  Laterality: N/A;   FRACTURE SURGERY Left 1984   intramedullary rod   hip bone transplant  1984   LEG SURGERY Left    oseotomy   POLYPECTOMY  12/17/2023   Procedure: POLYPECTOMY, INTESTINE;  Surgeon: [name] Ole DASEN, MD;  Location: [facility] ENDOSCOPY;  Service: Endoscopy;;   REPAIR ANKLE LIGAMENT Right 1978   REPAIR KNEE LIGAMENT Bilateral    acl   TRIGGER FINGER RELEASE Bilateral    thumbs   Patient Active Problem List   Diagnosis Date Noted   Cubital tunnel syndrome on right 02/16/2024   Unintentional weight loss 02/12/2024   Lacunar stroke (HCC) 12/12/2023   Essential tremor 12/12/2023   Dizziness 10/30/2023   Postoperative hematoma of musculoskeletal structure following musculoskeletal procedure 08/01/2022   Cervical myelopathy (HCC) 07/30/2022   Cervical spinal stenosis 07/30/2022   Radiculopathy, cervical 07/30/2022   S/P cervical spinal fusion 07/30/2022   Vitamin D  deficiency 06/25/2022   B12 deficiency 06/25/2022   Elevated LDL cholesterol level 06/25/2022   Prediabetes 03/27/2021   History of nausea- reported 01/27/2020 02/02/2020   Accidental fall from ladder 02/02/2020   Acute right-sided thoracic back pain 02/02/2020   Neck stiffness 02/02/2020   Concussion with no loss of consciousness 02/02/2020   Multiple falls 02/02/2020   Head trauma, initial encounter 02/02/2020   Acute post-traumatic headache, not intractable 02/02/2020   Essential hypertension 08/26/2018   Migraine with vertigo  01/05/2014   Arthralgia of multiple joints 04/28/2009   Carpal tunnel syndrome 08/30/2008    ONSET DATE: summer 2022  REFERRING DIAG: LE weakness and gait instability   THERAPY DIAG:  Muscle weakness (generalized)  Other lack of coordination  Difficulty in walking, not elsewhere classified  Unsteadiness on feet  Rationale for Evaluation and Treatment: Rehabilitation  SUBJECTIVE:                                                                                                                                                                                              SUBJECTIVE STATEMENT:    Pt has not been to the clinic in a while and notes that he is going to have back surgery to see if that corrects his issue.  Pt notes he is not happy about having the surgery, as it seems to an exploratory ordeal, as they are hoping it will correct the issues in his LE's.     Pt accompanied by: self  PERTINENT HISTORY:  Patient presents for LE weakness and gait instability. He is s/p ACDF C-7 on 07/30/22. Patient has a leg length discrepancy and limited L knee flexion since his 20's. Patient has a lot of atrophy and lower back pain, foot drop bilaterally.  First symptoms was stumbling up stairs, with LLE. Foot drop was improved after ACDF. Now has numbness and tingling at the end of the day. Does have a few lesions in his pons per wife, on blood pressure medication now. PMH includes ADHD, arthritis, concussion COVID, HTN, multiple lacunar infarcts, MVP, pernicious anemia, rocky mountain spotted fever, spinal stenosis, vestibular migraine, vitamin B deficiency.   Patient had new study: Conclusion: This is an abnormal study.  1. There is electrodiagnostic evidence of chronic, severe right L5-S1 radiculopathy with ongoing active denervation. A left L5 radiculopathy is also likely but was not fully assessed on today's testing. Consider lumbar spine imaging if not already performed. 2. There is electrodiagnostic and sonographic evidence of a chronic, severe right ulnar neuropathy at the elbow with ongoing active denervation.  3. There is no electrodiagnostic evidence for large fiber polyneuropathy.   PAIN:  Are you having pain? Denies pain   PRECAUTIONS: Other: cervical; no lifting more than 20 lb   WEIGHT BEARING RESTRICTIONS: No  FALLS: Has patient fallen in last 6 months? Yes. Number of falls multiple a week  LIVING ENVIRONMENT: Lives with: lives  with their family Lives in: House/apartment Stairs: Yes: Internal: flight steps; is sleeping downstairs Has following equipment at home: Single point cane  PLOF: Independent, uses a walking stick  PATIENT GOALS: gait control of  feet, strengthen legs, balance, atrophy in hand  OBJECTIVE:   DIAGNOSTIC FINDINGS:  09/11/22: No acute fracture. No spondylolisthesis. No compression deformities. Disc space narrowing C3-4 with marginal osteophytes consistent with degenerative disc disease. Postop changes ACDF C4-C7 with anatomic alignment. Normal prevertebral and cervicocranial soft tissues.   06/19/22: 1. L5 chronic pars defects with L5-S1 anterolisthesis and advanced disc degeneration causing marked biforaminal L5 impingement. 2. Degeneration and short pedicles causes compressive spinal stenosis from L1-2 to L3-4, most advanced at L2-3. Moderate bilateral foraminal narrowing at the same levels.   COGNITION: Overall cognitive status: Within functional limits for tasks assessed    COORDINATION: Limited LLE  MUSCLE LENGTH: Significant limitation L hamstring; 60 degree flexion deficit.   POSTURE: weight shift to the R  LOWER EXTREMITY ROM:     L knee flexion contraction -60 contraction LOWER EXTREMITY MMT:    MMT Right Eval R 5/15 R 8/22 Left Eval L 5/15 L 8/22  Hip flexion 4 5 5 4  4+ 4  Hip extension        Hip abduction 4- 4 4 4- 4 4  Hip adduction 4- 4 4 4- 4- 4  Hip internal rotation        Hip external rotation        Knee flexion 4- 4 4 3  4- 4-  Knee extension 4 4+ 5 4 4  4-  Ankle dorsiflexion 3 3 3+ 2+ 2+ 2+  Ankle plantarflexion 3 3+ 4- 3 3+ 3+  Ankle inversion        Ankle eversion        (Blank rows = not tested)  01/02/23 Right 8/22: RECERT  10/17 PN Left 8/22: Recert LUE 10/17: PN   Shoulder Flexion 4 4+ 5 4- 4+ 4+  Shoulder Abduction 4- 4 4+ 4- 4 4+  Shoulder Extension 4- 4+ 4 4- 4   Shoulder adduction 4- 4  3+ 4 4+  Bicep  4 4+ 5 4 4+ 4  Triceps 4- 4-  5 3+ 4 4     Right Left  Hip flexion 12.2 10.2  Hip Abduction 7.4 7.0  Hip Adduction 10.8 10.5  Knee Extension  13.8 13.4  Knee Flexion 13.4 13.2  DF    PF       BED MOBILITY:  Perform next session  TRANSFERS: Assistive device utilized: Single point cane  Sit to stand: CGA Stand to sit: CGA Chair to chair: CGA   GAIT: Gait pattern: L knee limited extension, L foot drop, L foot eversion Distance walked: 1200 Assistive device utilized: Single point cane Level of assistance: CGA Comments: Patient has increased eversion and rolling of L foot, LLE leg length discrepancy.   FUNCTIONAL TESTS:  5 times sit to stand: 5.6 seconds with minor use of LLE no UE support Timed up and go (TUG): n/a 6 minute walk test: 1200 ft 10 meter walk test: 7 seconds BERG: 36/56   PATIENT SURVEYS:  FOTO 54  TODAY'S TREATMENT: DATE: 04/23/24  Unless otherwise stated, CGA was provided and gait belt donned in order to ensure pt safety   Physical Performance Testing:   6 Min Walk Test:  Instructed patient to ambulate as quickly and as safely as possible for 6 minutes using LRAD. Patient was allowed to take standing rest breaks without stopping the test, but if the patient required a sitting rest break the clock would be stopped and the test would be over.  Results: 1176' feet using bilateral crutches with supervision. Results  indicate that the patient has reduced endurance with ambulation compared to age matched norms.  Age Matched Norms (in meters): 33-69 yo M: 72 F: 16, 61-79 yo M: 7 F: 471, 13-89 yo M: 417 F: 392 MDC: 58.21 meters (190.98 feet) or 50 meters (ANPTA Core Set of Outcome Measures for Adults with Neurologic Conditions, 2018)    Self-Care and Home Management:  Pt educated on current POC and planning for discharge at this time due to upcoming surgery.  Pt agreeable to this change in POC and therapist and pt discussed current HEP and the exercises to perform.  Pt also  educated on the use of exercises within the 24 hours of his surgery to improve recall and neuro-pathway responsible for muscle activation.  Pt very appreciative of the information.    PATIENT EDUCATION: Education details: goals, POC, educated on safety during updated exercises  Person educated: Patient and Spouse Education method: Explanation, Demonstration, Tactile cues, and Verbal cues Education comprehension: verbalized understanding, returned demonstration, verbal cues required, and tactile cues required  HOME EXERCISE PROGRAM: Access Code: [email or web address] URL: [email or web address] Date: 10/11/2022 Prepared by: Marina  Moser  Exercises - Seated Ankle Alphabet  - 1 x daily - 7 x weekly - 2 sets - 10 reps - 5 hold - Seated Heel Toe Raises  - 1 x daily - 7 x weekly - 2 sets - 10 reps - 5 hold - Standing March with Counter Support  - 1 x daily - 7 x weekly - 2 sets - 10 reps - 5 hold  Access Code: [email or web address] URL: [email or web address] Date: 10/30/2022 Prepared by: Marina  Moser  Exercises - Seated Ankle Plantarflexion with Resistance  - 1 x daily - 7 x weekly - 2 sets - 10 reps - 5 hold - Seated Ankle Eversion with Resistance  - 1 x daily - 7 x weekly - 2 sets - 10 reps - 5 hold  Access Code: [email or web address] URL: [email or web address] Date: 11/14/2022 Prepared by: Marina  Moser  Exercises - Seated Heel Toe Raises  - 1 x daily - 7 x weekly - 2 sets - 10 reps - 5 hold - Seated Hip Internal Rotation AROM  - 1 x daily - 7 x weekly - 2 sets - 10 reps - 5 hold - Bilateral Long Arc Quad  - 1 x daily - 7 x weekly - 2 sets - 10 reps - 5 hold - Foam Balance Tandem stance  - 1 x daily - 7 x weekly - 2 sets - 2 reps - 30 hold - Single Leg Balance on Foam  - 1 x daily - 7 x weekly - 2 sets - 30 reps - 2 hold - Seated Hip Flexion March with Ankle Weights  - 1 x daily - 7 x weekly - 2 sets - 10 reps - 5 hold - Standing Hip Abduction with Ankle Weight  - 1 x daily -  7 x weekly - 2 sets - 10 reps - 5 hold - Standing Hip Extension with Ankle Weight  - 1 x daily - 7 x weekly - 2 sets - 10 reps - 5 hold - Standing Hip Flexion with Ankle Weight  - 1 x daily - 7 x weekly - 2 sets - 10 reps - 5 hold - Seated Long Arc Quad with Ankle Weight  - 1 x daily - 7 x weekly - 2 sets - 10 reps - 5 hold Access Code: [email or web address] URL: [email or web address] Date: 08/22/2023 Prepared by: Marina  Moser  Exercises - Seated  Finger Tip Pinch with Putty  - 1 x daily - 7 x weekly - 2 sets - 10 reps - 5 hold - Hand Squeezes  - 1 x daily - 7 x weekly - 2 sets - 10 reps - 5 hold - Key Pinch with Putty  - 1 x daily - 7 x weekly - 2 sets - 10 reps - 5 hold  GOALS: Goals reviewed with patient? Yes  SHORT TERM GOALS: Target date: 05/09/2023  Patient will be independent in home exercise program to improve strength/mobility for better functional independence with ADLs. Baseline: 2/21: HEP give next session 5/15 : intermittent compliance 8/22: intermittent compliance due to illness 11/14: compliant  Goal status: MET    LONG TERM GOALS: Target date: 04/09/24  Patient will increase FOTO score to equal to or greater than  64%   to demonstrate statistically significant improvement in mobility and quality of life.  Baseline: 2/21: 54% 8/22: 52% 10/17: 57% 11/14: 53% 1/16: 47% Goal status: TERMINATED  2.  Patient will increase Berg Balance score by > 6 points (42/56)to demonstrate decreased fall risk during functional activities. Baseline: 2/21: 36/56 5/15: 45   Goal status: MET  3.  Patient will increase six minute walk test distance to >1500 for progression to age norm community ambulator and improve gait ability Baseline: 2/21: 1200 ft with [facility] 5/15: 1300 ft with [facility]  8/22: 905 ft with [facility] 10/17: 1100 ft with [facility] 11/14: 1110 ft with [facility] 1/16: 1210 ft with [facility] 3/6: 1100 ft with two canes 5/29: 1195 ft with [facility] 6/19: defer due to patient bringing crutches in today 04/23/24:  1176' with B crutches Goal status: In progress  4.  Patient will increase BLE gross strength to 4+/5  or within 1 lb of each LE as to improve functional strength for independent gait, increased standing tolerance and increased ADL ability. Baseline: 2/21 :see above 5/15: see above 10/17: RLE 5/5 LLE 4/5 L ankle 3/5  11/14: RLE 5/5 LLE 4+/5 hamstring 4/5, ankle: 3+/5 1/16: RLE 5/5 LLE 4+/5 ankle 4-/5 3/6: see above 5/29: RLE 5/5 ankle 4-/5 LLE 4+/5 ankle 4-/5  6/19: L df 3-, pf 3; R df 3+, pf 4- 04/23/24: L df: 3+; R df: 4- Goal status:  In progress / modified  6.  Patient will increase Berg Balance score by > 6 points (51/56) to demonstrate decreased fall risk during functional activities. Baseline:  5/15: 45 8/22: 41/56 10/17: 28 11/14: 36 1/16: 36 3/6: 25 5/29: 34/56  6/19: 39/56  Goal status: ONGOING  7.   Patient will increase UE strength to >4+/5 for ADLs, AD usage, and quality of life.  Baseline: 5/15: see above 8/22: see above 11/14: >4+/5 bilaterally  Goal status: MET  8.   Patient will increase lower extremity functional scale to >60/80 to demonstrate improved functional mobility and increased tolerance with ADLs.  Baseline: 11/14: 29%   1/16: 29%   3/6: 19%   5/29: 36% 6/19: 14/80; 17.5% 04/23/24: 25/80; 31.3% Goal status: ONGOING   ASSESSMENT:  CLINICAL IMPRESSION:    Pt performed well and was helpful in the determining of his current POC.  Pt very receptive to the education provided and is ready to be discharged at this time.  Pt will likely return following the surgical procedure to see if the surgery assists in the atrophy and muscular complications that the patient is having in the lower extremities.  Pt is formally discharged at this time and encouraged to  reach out to the clinic for any other concerns.      OBJECTIVE IMPAIRMENTS: Abnormal gait, decreased activity tolerance, decreased balance, decreased coordination, decreased endurance, decreased mobility, difficulty  walking, decreased ROM, decreased strength, hypomobility, impaired flexibility, improper body mechanics, and postural dysfunction.   ACTIVITY LIMITATIONS: carrying, lifting, bending, standing, squatting, stairs, transfers, reach over head, locomotion level, and caring for others  PARTICIPATION LIMITATIONS: meal prep, cleaning, laundry, personal finances, driving, shopping, community activity, and yard work  PERSONAL FACTORS: Age, Past/current experiences, Time since onset of injury/illness/exacerbation, and 3+ comorbidities: DHD, arthritis, concussion COVID, HTN, multiple lacunar infarcts, MVP, pernicious anemia, rocky mountain spotted fever, spinal stenosis, vestibular migraine, vitamin B deficiency.  are also affecting patient's functional outcome.   REHAB POTENTIAL: Good  CLINICAL DECISION MAKING: Evolving/moderate complexity  EVALUATION COMPLEXITY: Moderate  PLAN:   PT FREQUENCY: 1x/week  PT DURATION: 12 weeks  PLANNED INTERVENTIONS: Therapeutic exercises, Therapeutic activity, Neuromuscular re-education, Balance training, Gait training, Patient/Family education, Self Care, Joint mobilization, Stair training, Vestibular training, Canalith repositioning, Visual/preceptual remediation/compensation, Orthotic/Fit training, DME instructions, Cognitive remediation, Electrical stimulation, Spinal mobilization, Cryotherapy, Moist heat, Splintting, Taping, Traction, Ultrasound, Manual therapy, and Re-evaluation  PLAN FOR NEXT SESSION:   d/c at this time   Fonda Simpers, PT, DPT Physical Therapist - [facility]  04/23/24, 7:03 PM

## 2024-04-23 NOTE — Therapy (Signed)
 Occupational Therapy Neuro Treatment/Discharge Note  Patient Name: [name] MRN: [id number] DOB:12/18/1960, 63 y.o., male Today's Date: 04/23/2024  PCP: Melvin Pao, NP REFERRING PROVIDER: Maree Hila, MARLA, MD  END OF SESSION:  OT End of Session - 04/23/24 1439     Visit Number 14    Number of Visits 24    Date for OT Re-Evaluation 05/05/24    OT Start Time 1445    OT Stop Time 1530    OT Time Calculation (min) 45 min    Activity Tolerance Patient tolerated treatment well    Behavior During Therapy [facility] for tasks assessed/performed           Past Medical History:  Diagnosis Date   ADHD    Arthritis    Concussion    COVID 12/2020   Hypertension    Multiple lacunar infarcts (HCC)    MVP (mitral valve prolapse)    Pernicious anemia    [place] spotted fever    Spinal stenosis    Stroke [facility])    Vestibular migraine    Vitamin B12 deficiency    Vitamin D  deficiency    Past Surgical History:  Procedure Laterality Date   ANTERIOR CERVICAL DECOMP/DISCECTOMY FUSION N/A 07/30/2022   Procedure: C4-7 ANTERIOR CERVICAL DISCECTOMY AND FUSION (GLOBUS HEDRON);  Surgeon: Clois Fret, MD;  Location: [facility] ORS;  Service: Neurosurgery;  Laterality: N/A;   BILATERAL CARPAL TUNNEL RELEASE     CERVICAL WOUND DEBRIDEMENT N/A 08/01/2022   Procedure: CERVICAL WOUND DEBRIDEMENT OF HEMATOMA;  Surgeon: Clois Fret, MD;  Location: [facility] ORS;  Service: Neurosurgery;  Laterality: N/A;   COLONOSCOPY     COLONOSCOPY WITH PROPOFOL  N/A 12/17/2023   Procedure: COLONOSCOPY WITH PROPOFOL ;  Surgeon: Maryruth Ole DASEN, MD;  Location: [facility] ENDOSCOPY;  Service: Endoscopy;  Laterality: N/A;   ESOPHAGOGASTRODUODENOSCOPY (EGD) WITH PROPOFOL  N/A 12/17/2023   Procedure: ESOPHAGOGASTRODUODENOSCOPY (EGD) WITH PROPOFOL ;  Surgeon: Maryruth Ole DASEN, MD;  Location: [facility] ENDOSCOPY;  Service: Endoscopy;  Laterality: N/A;   FRACTURE SURGERY Left 1984   intramedullary rod   hip bone  transplant  1984   LEG SURGERY Left    oseotomy   POLYPECTOMY  12/17/2023   Procedure: POLYPECTOMY, INTESTINE;  Surgeon: Maryruth Ole DASEN, MD;  Location: [facility] ENDOSCOPY;  Service: Endoscopy;;   REPAIR ANKLE LIGAMENT Right 1978   REPAIR KNEE LIGAMENT Bilateral    acl   TRIGGER FINGER RELEASE Bilateral    thumbs   Patient Active Problem List   Diagnosis Date Noted   Cubital tunnel syndrome on right 02/16/2024   Unintentional weight loss 02/12/2024   Lacunar stroke (HCC) 12/12/2023   Essential tremor 12/12/2023   Dizziness 10/30/2023   Postoperative hematoma of musculoskeletal structure following musculoskeletal procedure 08/01/2022   Cervical myelopathy (HCC) 07/30/2022   Cervical spinal stenosis 07/30/2022   Radiculopathy, cervical 07/30/2022   S/P cervical spinal fusion 07/30/2022   Vitamin D  deficiency 06/25/2022   B12 deficiency 06/25/2022   Elevated LDL cholesterol level 06/25/2022   Prediabetes 03/27/2021   History of nausea- reported 01/27/2020 02/02/2020   Accidental fall from ladder 02/02/2020   Acute right-sided thoracic back pain 02/02/2020   Neck stiffness 02/02/2020   Concussion with no loss of consciousness 02/02/2020   Multiple falls 02/02/2020   Head trauma, initial encounter 02/02/2020   Acute post-traumatic headache, not intractable 02/02/2020   Essential hypertension 08/26/2018   Migraine with vertigo 01/05/2014   Arthralgia of multiple joints 04/28/2009   Carpal tunnel syndrome 08/30/2008  ONSET DATE: 07/30/2022  REFERRING DIAG: ACDF, Hx of CVAs  THERAPY DIAG:  Muscle weakness (generalized)  Other lack of coordination  Rationale for Evaluation and Treatment: Rehabilitation  SUBJECTIVE:   SUBJECTIVE STATEMENT: Pt.  Reports [place] summer camp this week 2/2 rain. next scheduled visit.  Pt accompanied by: self  PERTINENT HISTORY: Pt. underwent an ACDF C4-C7. L5-S1 anterolithesis, and disc  degeneration causing marked biforminal L5 impingement, Spinal stenosis from L1-2 to L3-4 with L2-3 being the most advanced. Bilateral hand tremors. Bilateral Lacunar infarcts in 2020. See above list for PMHx .  PRECAUTIONS: None  WEIGHT BEARING RESTRICTIONS: No  PAIN:  Are you having pain? 1/10 in the shoulders/neck  FALLS: Has patient fallen in last 6 months? Yes. Number of falls 6-10 falls  LIVING ENVIRONMENT: Lives with: lives with their spouse Lives in: House/apartment 2 levels Stairs: 4-5 steps. Has following equipment at home: Single point cane, Walker - 2 wheeled, and shower chair  PLOF: Independent  PATIENT GOALS:  To improve bilateral hand cooridnation  OBJECTIVE:  Note: Objective measures were completed at Evaluation unless otherwise noted.  HAND DOMINANCE: Right hand dominant  ADLs:  Transfers/ambulation related to ADLs: Eating: feels like hand, and mouth are not working together, drinks drip out of mouth, Difficulty using fork, and knife together for self-feeding. Grooming: Independent brushing teeth UB Dressing: Difficulty buttoning LB Dressing: Difficulty managing pants, shoes, and socks Toileting: Independent Bathing: incoordination with applying shampoo with bilateral hands Tub Shower transfers: Distant supervision   IADLs: Shopping: Delivery Light housekeeping: Difficulty with vacuuming-bending over Meal Prep:  Independent Community mobility:  Independent Medication management: Wife sets up pillbox-Pt. Takes pills independently  Financial management: No changes Handwriting: 75% legible  MOBILITY STATUS: Hx of falls  POSTURE COMMENTS:   Sitting balance: good  ACTIVITY TOLERANCE: Activity tolerance: Fair   UPPER EXTREMITY ROM:    Active ROM Right Eval WFL Left Eval WFL  (Blank rows = not tested)  UPPER EXTREMITY MMT:     MMT Right Eval  02/11/24 04/23/24 Left Eval  02/11/24 04/23/24  Shoulder flexion 4+/5 5/5 5/5 4+/5 5/5 5/5   Shoulder abduction 4+/5 4+/5 5/5 4+/5 4+/5 5/5  Shoulder adduction        Shoulder extension        Shoulder internal rotation        Shoulder external rotation        Middle trapezius        Lower trapezius        Elbow flexion 5/5 5/5 5/5 5/5 5/5 5/5  Elbow extension 5/5 5/5 5/5 5/5 5/5 5/5  Wrist flexion        Wrist extension 5/5 5/5 5/5 5/5 5/5 5/5  Wrist ulnar deviation        Wrist radial deviation        Wrist pronation        Wrist supination        (Blank rows = not tested)  HAND FUNCTION: Grip strength: Right: 48 lbs; Left: 44 lbs, Lateral pinch: Right: 17 lbs, Left: 16 lbs, and 3 point pinch: Right: 12 lbs, Left: 19 lbs  02/11/24:   Grip strength: Right: 57 lbs; Left: 52 lbs, Lateral pinch: Right: 12 lbs, Left: 15 lbs, and 3 point pinch: Right: 10 lbs, Left: 15 lbs  04/23/24:   Grip strength: Right: 59  lbs; Left: 55 lbs, Lateral pinch: Right: 16 lbs, Left: 15 lbs, and 3  point pinch: Right: 10 lbs, Left: 15 lbs   COORDINATION: 9 Hole Peg test: Right: 25 sec; Left: 26 sec  02/11/24:  9 Hole Peg test: Right: 21 sec; Left: 26 sec  04/23/24:  9 Hole Peg test: Right: 24 sec; Left: 26 sec      SENSATION:  Pt. reports no changes  EDEMA: N/A  MUSCLE TONE: Intact;  Atrophy at the right dorsal interossei muscles.  COGNITION: Overall cognitive status: WFL for tasks performed  VISION:  Wears glasses. No change from baseline   PERCEPTION: intact   OBSERVATIONS: Pt. Left his 2 wheeled scooter in the waiting/reception area                                                                                                                             TREATMENT DATE:  04/23/24  Therapeutic Ex.:   -UE strengthening through reciprocal motion using the [employer] for 8 min. on level 4.0 in both forward, and reverse directions with constant monitoring of the BUEs. UE strengthening was performed to increase strength needed for ADLs, and IADLs.     Neuromuscular  re-ed:  -Facilitated FMC skills grasping 1 thin Jamar pegs, and worked on grasping them from a horizontal position in the dish with his left switching to the right hand. -Facilitated FMC skills using the Jamar Tweezer Dexterity Task using resistive tweezers to pick up 1 thin sticks from a horizontal position, and sustaining the grasp while preparing to place them into a vertical position with the pegboard placed at a flat tabletop surface. -Pt. worked on using the [employer] to remove the 1 sticks from the pegboard -Pt. worked on removing the thin Jamar pegs alternating thumb opposition to the tip of the 2nd through 5th digits of each hand.  PATIENT EDUCATION: Education details:  [facility] skills, positioning, and dual tasking. Education method: Medical illustrator Education comprehension: verbalized understanding and returned demonstration  HOME EXERCISE PROGRAM:   Hand strengthening with green theraputtty.   GOALS: Goals reviewed with patient? Yes  SHORT TERM GOALS: Target date: 03/24/2024   Pt. Will be independent with HEPs for BUEs  Baseline: 04/23/24: Independent 02/11/2024: Independent with hand exercises with the theraputty. Eval: No current HEP Goal status Achieved  LONG TERM GOALS: Target date: 05/05/24  Pt. Will perform buttoning with modified independence Baseline: 02/11/24: Independent buttoning a shirt-depending upon button size. Pt reports having difficulty with the small cuff sleeves for which he uses a buttonhook for at home, however it is not usually readily available if he needs to button/unbutton his sleeve cuff when he is out. Eval: Buttoning is difficult Goal status: Achieved  2.  Pt. Will be able to cut food with Modified independence Baseline: 04/23/24: Independent 02/11/24: Pt. Reports improvement with cutting food, however still has difficulty with bilateral hand coordination. Eval: Pt. Has difficulty using bilateral hand to cut food. Goal status:  Achieved  3.  Pt. Will improve bilateral UE strength by 2  mm grades to be able to sustain bilateral UEs in elevation while coordinating bilateral hands for shampooing. Baseline: 04/23/24: 5/5 overall 02/11/24: Improved. BUE shoulder flexion: 5/5, abduction: 4+/5, elbow flexion, and extension 5/5, wrist extension: 5/5. Pt. Has improved with washing/shampooing hair, however reports still having difficulty with coordinating bilateral hand movements. Eval: Pt. Has difficulty coordinating UEs during shampooing. Goal status: Partially met  4.  Pt. Will demonstrate compensatory/adaptive equipment strategies for LE dressing tasks.  Baseline: 04/23/24: Pt. reports independence without equipment. However will be having back surgery soon. 02/11/24: Pt. reports the LE dressing has improved, however continues to have difficulty coordinating bilateral hand function. Eval: Pt. has difficulty performing LE dressing   Goal status: Achieved  5.  Pt. Will identify 2 compensatory strategies for bilateral tremors during ADLs/IADLs Baseline: 04/23/24: Pt. Is using compensatory strategies 02/11/24: Cues for compensatory strategies. Eval: Education to be provided Goal status: Achieved  6. Pt. Will improve bilateral pinch strength by 3# to be able to securely hold items in his  hand during ADLs/IADLs Baseline: 04/23/24:  Lateral pinch: Right: 16 lbs, Left: 15 lbs, and 3 point pinch: Right: 10 lbs, Left: 15 lbs  02/11/24: Lateral pinch: Right: 12 lbs, Left: 15 lbs, and 3 point pinch: Right: 10 lbs, Left: 15 lbs Eval: Lateral pinch: Right: 17 lbs, Left: 16 lbs, and 3 point pinch: Right: 12 lbs, Left: 19 lbs Goal status: Partially met  ASSESSMENT:  CLINICAL IMPRESSION:  Pt. reports having an appointment  scheduled with the neurosurgeon this Thursday. Pt. requires increased time, and assist to grasp the 1 thin pegs with the resistive tweezers while transitioning them from horizontally in the dish to vertically on the pegboard.  Pt. frequently switched between the right, and left hands during each of the [facility] tasks. Pt. reports having more FMC control, and cooperation between the fingers with the right hand.  Pt. Is able to achieve more pressure control with the tweezers when using the left hand. Pt. continues to benefit from OT services to work on improving BUE functioning, and maximize independence with ADLs, and IADL tasks.   PERFORMANCE DEFICITS: in functional skills including ADLs, IADLs, coordination, dexterity, ROM, strength, pain, Fine motor control, Gross motor control, and UE functional use, cognitive skills including , and psychosocial skills including coping strategies, environmental adaptation, and routines and behaviors.   IMPAIRMENTS: are limiting patient from ADLs, IADLs, and leisure.   CO-MORBIDITIES: may have co-morbidities  that affects occupational performance. Patient will benefit from skilled OT to address above impairments and improve overall function.  MODIFICATION OR ASSISTANCE TO COMPLETE EVALUATION: Min-Moderate modification of tasks or assist with assess necessary to complete an evaluation.  OT OCCUPATIONAL PROFILE AND HISTORY: Detailed assessment: Review of records and additional review of physical, cognitive, psychosocial history related to current functional performance.  CLINICAL DECISION MAKING: Moderate - several treatment options, min-mod task modification necessary  REHAB POTENTIAL: Good  EVALUATION COMPLEXITY: Moderate    PLAN:  OT FREQUENCY: 2x/month  OT DURATION: 12 weeks  PLANNED INTERVENTIONS: 97535 self care/ADL training, 02889 therapeutic exercise, 97530 therapeutic activity, 97112 neuromuscular re-education, 97140 manual therapy, 97018 paraffin, 02989 moist heat, 97034 contrast bath, energy conservation, patient/family education, and DME and/or AE instructions  RECOMMENDED OTHER SERVICES: PT  CONSULTED AND AGREED WITH PLAN OF CARE: Patient  PLAN FOR NEXT SESSION:  Treatment  Richardson Otter, MS, OTR/L   04/23/24

## 2024-04-30 ENCOUNTER — Encounter: Attending: Psychology | Admitting: Psychology

## 2024-04-30 ENCOUNTER — Ambulatory Visit: Admitting: Occupational Therapy

## 2024-04-30 ENCOUNTER — Encounter: Payer: Self-pay | Admitting: Psychology

## 2024-04-30 ENCOUNTER — Ambulatory Visit

## 2024-04-30 DIAGNOSIS — M6281 Muscle weakness (generalized): Secondary | ICD-10-CM | POA: Diagnosis not present

## 2024-04-30 DIAGNOSIS — R262 Difficulty in walking, not elsewhere classified: Secondary | ICD-10-CM

## 2024-04-30 DIAGNOSIS — F840 Autistic disorder: Secondary | ICD-10-CM | POA: Insufficient documentation

## 2024-04-30 DIAGNOSIS — R413 Other amnesia: Secondary | ICD-10-CM | POA: Insufficient documentation

## 2024-04-30 DIAGNOSIS — S069XAS Unspecified intracranial injury with loss of consciousness status unknown, sequela: Secondary | ICD-10-CM | POA: Insufficient documentation

## 2024-04-30 DIAGNOSIS — F02818 Dementia in other diseases classified elsewhere, unspecified severity, with other behavioral disturbance: Secondary | ICD-10-CM | POA: Insufficient documentation

## 2024-04-30 DIAGNOSIS — R278 Other lack of coordination: Secondary | ICD-10-CM

## 2024-04-30 DIAGNOSIS — R2681 Unsteadiness on feet: Secondary | ICD-10-CM
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
## 2024-04-30 NOTE — Therapy (Signed)
 OUTPATIENT PHYSICAL THERAPY NEURO EVALUATION   Patient Name: [name] MRN: [id number] DOB:10-23-1960, 63 y.o., male Today's Date: 04/30/2024   PCP: Melvin Pao, NP  REFERRING PROVIDER: Maree Jannett POUR, MD   END OF SESSION:  PT End of Session - 04/30/24 1408     Visit Number 1    Number of Visits 16    Date for PT Re-Evaluation 06/25/24    Authorization Type eval 9/11    PT Start Time 1404    PT Stop Time 1445    PT Time Calculation (min) 41 min          Past Medical History:  Diagnosis Date   ADHD    Arthritis    Concussion    COVID 12/2020   Hypertension    Multiple lacunar infarcts (HCC)    MVP (mitral valve prolapse)    Pernicious anemia    Rocky [place] spotted fever    Spinal stenosis    Stroke [facility])    Vestibular migraine    Vitamin B12 deficiency    Vitamin D  deficiency    Past Surgical History:  Procedure Laterality Date   ANTERIOR CERVICAL DECOMP/DISCECTOMY FUSION N/A 07/30/2022   Procedure: C4-7 ANTERIOR CERVICAL DISCECTOMY AND FUSION (GLOBUS HEDRON);  Surgeon: Clois Fret, MD;  Location: [facility] ORS;  Service: Neurosurgery;  Laterality: N/A;   BILATERAL CARPAL TUNNEL RELEASE     CERVICAL WOUND DEBRIDEMENT N/A 08/01/2022   Procedure: CERVICAL WOUND DEBRIDEMENT OF HEMATOMA;  Surgeon: Clois Fret, MD;  Location: [facility] ORS;  Service: Neurosurgery;  Laterality: N/A;   COLONOSCOPY     COLONOSCOPY WITH PROPOFOL  N/A 12/17/2023   Procedure: COLONOSCOPY WITH PROPOFOL ;  Surgeon: Maryruth Ole DASEN, MD;  Location: [facility] ENDOSCOPY;  Service: Endoscopy;  Laterality: N/A;   ESOPHAGOGASTRODUODENOSCOPY (EGD) WITH PROPOFOL  N/A 12/17/2023   Procedure: ESOPHAGOGASTRODUODENOSCOPY (EGD) WITH PROPOFOL ;  Surgeon: Maryruth Ole DASEN, MD;  Location: [facility] ENDOSCOPY;  Service: Endoscopy;  Laterality: N/A;   FRACTURE SURGERY Left 1984   intramedullary rod   hip bone transplant  1984   LEG SURGERY Left    oseotomy   POLYPECTOMY  12/17/2023    Procedure: POLYPECTOMY, INTESTINE;  Surgeon: Maryruth Ole DASEN, MD;  Location: [facility] ENDOSCOPY;  Service: Endoscopy;;   REPAIR ANKLE LIGAMENT Right 1978   REPAIR KNEE LIGAMENT Bilateral    acl   TRIGGER FINGER RELEASE Bilateral    thumbs   Patient Active Problem List   Diagnosis Date Noted   Cubital tunnel syndrome on right 02/16/2024   Unintentional weight loss 02/12/2024   Lacunar stroke (HCC) 12/12/2023   Essential tremor 12/12/2023   Dizziness 10/30/2023   Postoperative hematoma of musculoskeletal structure following musculoskeletal procedure 08/01/2022   Cervical myelopathy (HCC) 07/30/2022   Cervical spinal stenosis 07/30/2022   Radiculopathy, cervical 07/30/2022   S/P cervical spinal fusion 07/30/2022   Vitamin D  deficiency 06/25/2022   B12 deficiency 06/25/2022   Elevated LDL cholesterol level 06/25/2022   Prediabetes 03/27/2021   History of nausea- reported 01/27/2020 02/02/2020   Accidental fall from ladder 02/02/2020   Acute right-sided thoracic back pain 02/02/2020   Neck stiffness 02/02/2020   Concussion with no loss of consciousness 02/02/2020   Multiple falls 02/02/2020   Head trauma, initial encounter 02/02/2020   Acute post-traumatic headache, not intractable 02/02/2020   Essential hypertension 08/26/2018   Migraine with vertigo 01/05/2014   Arthralgia of multiple joints 04/28/2009   Carpal tunnel syndrome 08/30/2008    ONSET DATE: 01/18/2021  REFERRING DIAG:  M62.81 (ICD-10-CM) -  Muscle weakness (generalized)  R27.8 (ICD-10-CM) - Other lack of coordination  R26.2 (ICD-10-CM) - Difficulty in walking, not elsewhere classified  R26.81 (ICD-10-CM) - Unsteadiness on feet    THERAPY DIAG:  No diagnosis found.  Rationale for Evaluation and Treatment: Rehabilitation  SUBJECTIVE:                                                                                                                                                                                              SUBJECTIVE STATEMENT:  Pt reports he is back due to his spouse telling him he needed to return.  Pt unsure of why he is here at this time as well and agrees with therapist from the discharge visit that pt has been doing well and has been doing more at home, but his wife only sees him laying on the sofa due to being fatigued from mobilizing throughout the day.   Pt accompanied by: self  PERTINENT HISTORY:   Patient presents for LE weakness and gait instability. He is s/p ACDF C-7 on 07/30/22. Patient has a leg length discrepancy and limited L knee flexion since his 20's. Patient has a lot of atrophy and lower back pain, foot drop bilaterally.  First symptoms was stumbling up stairs, with LLE. Foot drop was improved after ACDF. Now has numbness and tingling at the end of the day. Does have a few lesions in his pons per wife, on blood pressure medication now. PMH includes ADHD, arthritis, concussion COVID, HTN, multiple lacunar infarcts, MVP, pernicious anemia, rocky mountain spotted fever, spinal stenosis, vestibular migraine, vitamin B deficiency.    Patient had new study: Conclusion: This is an abnormal study.  1. There is electrodiagnostic evidence of chronic, severe right L5-S1 radiculopathy with ongoing active denervation. A left L5 radiculopathy is also likely but was not fully assessed on today's testing. Consider lumbar spine imaging if not already performed. 2. There is electrodiagnostic and sonographic evidence of a chronic, severe right ulnar neuropathy at the elbow with ongoing active denervation.  3. There is no electrodiagnostic evidence for large fiber polyneuropathy.  PAIN:  Are you having pain? Yes: NPRS scale: 2/10 Pain location: abdominal pain Pain description: Squeezing/Compression Aggravating factors: Walking/Sitting Relieving factors: Laying down  PRECAUTIONS: Other: Cervical; no lifting more than 20#  RED FLAGS: None   WEIGHT BEARING RESTRICTIONS:  No  FALLS: Has patient fallen in last 6 months? Yes. Number of falls 3-4  LIVING ENVIRONMENT: Lives with: lives with their family Lives in: House/apartment Stairs: Yes: Internal: flight steps; is sleeping downstairs Has following equipment at home: Single point cane  PLOF: Independent with use  of crutches  PATIENT GOALS: Prep for surgery and get rid of pain in the back.  Preferably reduce the risk of the 2/10 pain becoming a 3-4/10   OBJECTIVE:  Note: Objective measures were completed at Evaluation unless otherwise noted.  DIAGNOSTIC FINDINGS:   EXAM: MRI LUMBAR SPINE WITHOUT CONTRAST  IMPRESSION: 1. Mild osteoarthritis of bilateral SI joints. 2. Mild tendinosis of the left hamstring origin. Mild tendinosis of the right hamstring origin with a partial-thickness tear. 3. At L5-S1 there is severe disc height loss with Modic 1 endplate changes. Grade 1 anterolisthesis of L5 on S1 secondary to bilateral L5 pars interarticularis defects. Severe bilateral foraminal stenosis with L5 nerve root compression. Mild bilateral facet arthropathy. 4. At L4-5 there is a mild disc bulge. Moderate bilateral facet arthropathy and ligament flavum thickening. Mild central canal stenosis and bilateral subarticular recess stenosis. Moderate left foraminal stenosis. Mild right foraminal stenosis.    EXAM: MRI LUMBAR SPINE WITHOUT CONTRAST  IMPRESSION: 1. Chronic L5 spondylolysis with grade 1 spondylolisthesis and severe bilateral L5 neural foraminal stenosis is stable from a 2023 MRI.   2. Multifactorial chronic but increased spinal and lateral recess stenosis at L1-L2 (moderate to severe greater on the right), L2-L3 (moderate to severe and greater on the left), and L3-L4 (moderate and greater on the left). And focal progression of a right lateral recess small disc extrusion at L1-L2 with associated degenerative endplate marrow edema there. Query right L2 radiculitis.   EXAMINATION: CT  ABDOMEN PELVIS W CONTRAST    IMPRESSION:   No acute findings in the abdomen or pelvis.   COGNITION: Overall cognitive status: Within functional limits for tasks assessed    COORDINATION: Limited in the L LE   LOWER EXTREMITY MMT:      MMT Right Eval Left Eval  Hip flexion 4 4  Hip extension      Hip abduction 4- 4-  Hip adduction 4- 4-  Hip internal rotation      Hip external rotation      Knee flexion 4- 3  Knee extension 4 4  Ankle dorsiflexion 3 2+  Ankle plantarflexion 3 3  Ankle inversion      Ankle eversion      (Blank rows = not tested)   TRANSFERS: Sit to stand: Complete Independence  Assistive device utilized: None      RAMP:  Not tested  CURB:  Not tested  STAIRS: Not tested GAIT: Gait pattern: L knee limited extension, L foot drop, L foot eversion Distance walked: 100' Assistive device utilized: None and with crutches Level of assistance: CGA Comments: Patient has increased eversion and rolling of L foot, LLE leg length discrepancy.   FUNCTIONAL TESTS:  From prior session 7 days ago: 5 times sit to stand: 5.6 seconds with minor use of LLE no UE support 6 minute walk test: 1200 ft 10 meter walk test: 7 seconds BERG: 36/56       PATIENT SURVEYS:  None performed at this time.  TREATMENT DATE: 04/30/24  Evaluation  Self-Care/Home Management:  Pt given education on current POC, the findings of the evaluation, and the ways in which skilled therapy can address the current deficits in balance and musculature strength, more specifically in the abdominal region for core stability prior to surgery..  Pt instructed on how this can improve their overall function and maintain/improve their overall quality of life.       PATIENT EDUCATION: Education details: Pt educated on role of PT and services provided during current  POC, along with prognosis and information about the clinic. Person educated: Patient Education method: Explanation Education comprehension: verbalized understanding  HOME EXERCISE PROGRAM: TBD  GOALS: Goals reviewed with patient? Yes  SHORT TERM GOALS: Target date: 05/28/2024  Pt will be independent with HEP in order to demonstrate increased ability to perform tasks related to occupation/hobbies. Baseline: Goal status: INITIAL   LONG TERM GOALS: Target date: 06/25/2024  Patient will increase lower extremity functional scale to >60/80 to demonstrate improved functional mobility and increased tolerance with ADLs.  Baseline: 04/23/24: 25/80; 31.3% Goal status: INITIAL  2.  Patient will increase six minute walk test distance to >1500 for progression to age norm community ambulator and improve gait ability  Baseline: 04/23/24: 1176' with B crutches  Goal status: INITIAL  3.  Pt will reduce overall core/abdominal pain level to 0/10 by utilizing a combination of stretching, strengthening exercises, and pain-reducing modalities in order to improve overall QoL. Baseline: 2/10 Goal status: INITIAL   ASSESSMENT:  CLINICAL IMPRESSION: Patient is a 63 y.o. male who was seen today for physical therapy evaluation and treatment for imbalance and lack of core strength.  Pt is very well-known to the clinic, just being discharged 7 days prior to this initial evaluation and being agreed to do so by pt and therapist alike.  Pt is seeking therapy at this time due to pt's spouse insisting he perform due to the lack of mobility at home outside of the clinic.  Pt does not demonstrate good prognosis at this time due to the recent nature of his POC and discharge 7 days ago.  Pt ultimately does not display adequate capability at this time to improve upon his current deficits.  Pt will attempt therapy again to improve strength, mobility, and stability for an improvement in his overall QoL.   OBJECTIVE  IMPAIRMENTS: Abnormal gait, decreased activity tolerance, decreased balance, decreased coordination, decreased endurance, decreased mobility, difficulty walking, decreased ROM, decreased strength, hypomobility, impaired flexibility, improper body mechanics, and postural dysfunction. .   ACTIVITY LIMITATIONS: carrying, lifting, bending, standing, squatting, stairs, transfers, reach over head, locomotion level, and caring for others    PARTICIPATION LIMITATIONS: meal prep, cleaning, laundry, personal finances, driving, shopping, community activity, and yard work   PERSONAL FACTORS: Age, Past/current experiences, Time since onset of injury/illness/exacerbation, and 3+ comorbidities: DHD, arthritis, concussion COVID, HTN, multiple lacunar infarcts, MVP, pernicious anemia, rocky mountain spotted fever, spinal stenosis, vestibular migraine, vitamin B deficiency.  are also affecting patient's functional outcome.    REHAB POTENTIAL: Poor pt was discharged last week due to lack of progress and the upcoming back surgery that will occur.  CLINICAL DECISION MAKING: Unstable/unpredictable  EVALUATION COMPLEXITY: Moderate  PLAN:  PT FREQUENCY: 1x/week  PT DURATION: 8 weeks  PLANNED INTERVENTIONS: Therapeutic exercises, Therapeutic activity, Neuromuscular re-education, Balance training, Gait training, Patient/Family education, Self Care, Joint mobilization, Stair training, Vestibular training, Canalith repositioning, Visual/preceptual remediation/compensation, Orthotic/Fit training, DME instructions, Cognitive remediation, Electrical stimulation, Spinal mobilization, Cryotherapy, Moist heat, Splintting,  Taping, Traction, Ultrasound, Manual therapy, and Re-evaluation   PLAN FOR NEXT SESSION: establish core exercises for surgical intervention.  Fonda Simpers, PT, DPT Physical Therapist - [facility]  04/30/24, 11:26 PM

## 2024-05-03 ENCOUNTER — Other Ambulatory Visit: Payer: Self-pay | Admitting: Student

## 2024-05-06 DIAGNOSIS — R4189 Other symptoms and signs involving cognitive functions and awareness: Secondary | ICD-10-CM | POA: Diagnosis not present

## 2024-05-06 DIAGNOSIS — R29898 Other symptoms and signs involving the musculoskeletal system: Secondary | ICD-10-CM | POA: Diagnosis not present

## 2024-05-06 DIAGNOSIS — R634 Abnormal weight loss: Secondary | ICD-10-CM | POA: Diagnosis not present

## 2024-05-06 DIAGNOSIS — G4733 Obstructive sleep apnea (adult) (pediatric): Secondary | ICD-10-CM | POA: Diagnosis not present

## 2024-05-06 DIAGNOSIS — G214 Vascular parkinsonism: Secondary | ICD-10-CM | POA: Diagnosis not present

## 2024-05-06 DIAGNOSIS — G629 Polyneuropathy, unspecified: Secondary | ICD-10-CM | POA: Diagnosis not present

## 2024-05-07 ENCOUNTER — Ambulatory Visit

## 2024-05-07 ENCOUNTER — Ambulatory Visit: Admitting: Occupational Therapy

## 2024-05-07 ENCOUNTER — Encounter: Admitting: Occupational Therapy

## 2024-05-07 DIAGNOSIS — M6281 Muscle weakness (generalized): Secondary | ICD-10-CM

## 2024-05-07 DIAGNOSIS — F840 Autistic disorder: Secondary | ICD-10-CM | POA: Diagnosis not present

## 2024-05-07 DIAGNOSIS — R2681 Unsteadiness on feet: Secondary | ICD-10-CM | POA: Diagnosis not present

## 2024-05-07 DIAGNOSIS — R262 Difficulty in walking, not elsewhere classified: Secondary | ICD-10-CM

## 2024-05-07 DIAGNOSIS — R4681 Obsessive-compulsive behavior: Secondary | ICD-10-CM | POA: Diagnosis not present

## 2024-05-07 DIAGNOSIS — R278 Other lack of coordination: Secondary | ICD-10-CM | POA: Diagnosis not present

## 2024-05-07 DIAGNOSIS — F0670 Mild neurocognitive disorder due to known physiological condition without behavioral disturbance: Secondary | ICD-10-CM | POA: Diagnosis not present

## 2024-05-07 NOTE — Therapy (Signed)
 OUTPATIENT PHYSICAL THERAPY NEURO TREATMENT   Patient Name: SMITH POTENZA MRN: [id number] DOB:08-31-1960, 63 y.o., male Today's Date: 05/07/2024   PCP: Melvin Pao, NP  REFERRING PROVIDER: Maree Jannett POUR, MD   END OF SESSION:  PT End of Session - 05/07/24 1409     Visit Number 2    Number of Visits 16    Date for Recertification  06/25/24    Authorization Type eval 9/11    PT Start Time 1409    PT Stop Time 1448    PT Time Calculation (min) 39 min    Equipment Utilized During Treatment Gait belt    Activity Tolerance Patient tolerated treatment well    Behavior During Therapy [facility] for tasks assessed/performed          Past Medical History:  Diagnosis Date   ADHD    Arthritis    Concussion    COVID 12/2020   Hypertension    Multiple lacunar infarcts (HCC)    MVP (mitral valve prolapse)    Pernicious anemia    Rocky [place] spotted fever    Spinal stenosis    Stroke [facility])    Vestibular migraine    Vitamin B12 deficiency    Vitamin D  deficiency    Past Surgical History:  Procedure Laterality Date   ANTERIOR CERVICAL DECOMP/DISCECTOMY FUSION N/A 07/30/2022   Procedure: C4-7 ANTERIOR CERVICAL DISCECTOMY AND FUSION (GLOBUS HEDRON);  Surgeon: Clois Fret, MD;  Location: [facility] ORS;  Service: Neurosurgery;  Laterality: N/A;   BILATERAL CARPAL TUNNEL RELEASE     CERVICAL WOUND DEBRIDEMENT N/A 08/01/2022   Procedure: CERVICAL WOUND DEBRIDEMENT OF HEMATOMA;  Surgeon: Clois Fret, MD;  Location: [facility] ORS;  Service: Neurosurgery;  Laterality: N/A;   COLONOSCOPY     COLONOSCOPY WITH PROPOFOL  N/A 12/17/2023   Procedure: COLONOSCOPY WITH PROPOFOL ;  Surgeon: Maryruth Ole DASEN, MD;  Location: [facility] ENDOSCOPY;  Service: Endoscopy;  Laterality: N/A;   ESOPHAGOGASTRODUODENOSCOPY (EGD) WITH PROPOFOL  N/A 12/17/2023   Procedure: ESOPHAGOGASTRODUODENOSCOPY (EGD) WITH PROPOFOL ;  Surgeon: Maryruth Ole DASEN, MD;  Location: [facility] ENDOSCOPY;  Service: Endoscopy;   Laterality: N/A;   FRACTURE SURGERY Left 1984   intramedullary rod   hip bone transplant  1984   LEG SURGERY Left    oseotomy   POLYPECTOMY  12/17/2023   Procedure: POLYPECTOMY, INTESTINE;  Surgeon: Maryruth Ole DASEN, MD;  Location: [facility] ENDOSCOPY;  Service: Endoscopy;;   REPAIR ANKLE LIGAMENT Right 1978   REPAIR KNEE LIGAMENT Bilateral    acl   TRIGGER FINGER RELEASE Bilateral    thumbs   Patient Active Problem List   Diagnosis Date Noted   Cubital tunnel syndrome on right 02/16/2024   Unintentional weight loss 02/12/2024   Lacunar stroke (HCC) 12/12/2023   Essential tremor 12/12/2023   Dizziness 10/30/2023   Postoperative hematoma of musculoskeletal structure following musculoskeletal procedure 08/01/2022   Cervical myelopathy (HCC) 07/30/2022   Cervical spinal stenosis 07/30/2022   Radiculopathy, cervical 07/30/2022   S/P cervical spinal fusion 07/30/2022   Vitamin D  deficiency 06/25/2022   B12 deficiency 06/25/2022   Elevated LDL cholesterol level 06/25/2022   Prediabetes 03/27/2021   History of nausea- reported 01/27/2020 02/02/2020   Accidental fall from ladder 02/02/2020   Acute right-sided thoracic back pain 02/02/2020   Neck stiffness 02/02/2020   Concussion with no loss of consciousness 02/02/2020   Multiple falls 02/02/2020   Head trauma, initial encounter 02/02/2020   Acute post-traumatic headache, not intractable 02/02/2020   Essential hypertension 08/26/2018   Migraine  with vertigo 01/05/2014   Arthralgia of multiple joints 04/28/2009   Carpal tunnel syndrome 08/30/2008    ONSET DATE: 01/18/2021  REFERRING DIAG:  M62.81 (ICD-10-CM) - Muscle weakness (generalized)  R27.8 (ICD-10-CM) - Other lack of coordination  R26.2 (ICD-10-CM) - Difficulty in walking, not elsewhere classified  R26.81 (ICD-10-CM) - Unsteadiness on feet    THERAPY DIAG:  Muscle weakness (generalized)  Other lack of coordination  Difficulty in walking, not elsewhere  classified  Unsteadiness on feet  Rationale for Evaluation and Treatment: Rehabilitation  SUBJECTIVE:                                                                                                                                                                                             SUBJECTIVE STATEMENT:  Pt reports he has been doing well since the last visit and has been doing activities outside of the home.  Pt reports he did have a fall backwards when trying to unload a vehicle that his wife requested for him to do.  Pt notes that he was lifting something approximately 50#, and he lost his balance backwards and landed on his buttocks.  Pt was able to get up and denies any injuries.     Pt accompanied by: self  PERTINENT HISTORY:   Patient presents for LE weakness and gait instability. He is s/p ACDF C-7 on 07/30/22. Patient has a leg length discrepancy and limited L knee flexion since his 20's. Patient has a lot of atrophy and lower back pain, foot drop bilaterally.  First symptoms was stumbling up stairs, with LLE. Foot drop was improved after ACDF. Now has numbness and tingling at the end of the day. Does have a few lesions in his pons per wife, on blood pressure medication now. PMH includes ADHD, arthritis, concussion COVID, HTN, multiple lacunar infarcts, MVP, pernicious anemia, rocky mountain spotted fever, spinal stenosis, vestibular migraine, vitamin B deficiency.    Patient had new study: Conclusion: This is an abnormal study.  1. There is electrodiagnostic evidence of chronic, severe right L5-S1 radiculopathy with ongoing active denervation. A left L5 radiculopathy is also likely but was not fully assessed on today's testing. Consider lumbar spine imaging if not already performed. 2. There is electrodiagnostic and sonographic evidence of a chronic, severe right ulnar neuropathy at the elbow with ongoing active denervation.  3. There is no electrodiagnostic evidence for large  fiber polyneuropathy.  PAIN:  Are you having pain? Yes: NPRS scale: 2/10 Pain location: abdominal pain Pain description: Squeezing/Compression Aggravating factors: Walking/Sitting Relieving factors: Laying down  PRECAUTIONS: Other: Cervical; no lifting more than 20#  RED FLAGS:  None   WEIGHT BEARING RESTRICTIONS: No  FALLS: Has patient fallen in last 6 months? Yes. Number of falls 3-4  LIVING ENVIRONMENT: Lives with: lives with their family Lives in: House/apartment Stairs: Yes: Internal: flight steps; is sleeping downstairs Has following equipment at home: Single point cane  PLOF: Independent with use of crutches  PATIENT GOALS: Prep for surgery and get rid of pain in the back.  Preferably reduce the risk of the 2/10 pain becoming a 3-4/10   OBJECTIVE:  Note: Objective measures were completed at Evaluation unless otherwise noted.  DIAGNOSTIC FINDINGS:   EXAM: MRI LUMBAR SPINE WITHOUT CONTRAST  IMPRESSION: 1. Mild osteoarthritis of bilateral SI joints. 2. Mild tendinosis of the left hamstring origin. Mild tendinosis of the right hamstring origin with a partial-thickness tear. 3. At L5-S1 there is severe disc height loss with Modic 1 endplate changes. Grade 1 anterolisthesis of L5 on S1 secondary to bilateral L5 pars interarticularis defects. Severe bilateral foraminal stenosis with L5 nerve root compression. Mild bilateral facet arthropathy. 4. At L4-5 there is a mild disc bulge. Moderate bilateral facet arthropathy and ligament flavum thickening. Mild central canal stenosis and bilateral subarticular recess stenosis. Moderate left foraminal stenosis. Mild right foraminal stenosis.    EXAM: MRI LUMBAR SPINE WITHOUT CONTRAST  IMPRESSION: 1. Chronic L5 spondylolysis with grade 1 spondylolisthesis and severe bilateral L5 neural foraminal stenosis is stable from a 2023 MRI.   2. Multifactorial chronic but increased spinal and lateral recess stenosis at L1-L2  (moderate to severe greater on the right), L2-L3 (moderate to severe and greater on the left), and L3-L4 (moderate and greater on the left). And focal progression of a right lateral recess small disc extrusion at L1-L2 with associated degenerative endplate marrow edema there. Query right L2 radiculitis.   EXAMINATION: CT ABDOMEN PELVIS W CONTRAST    IMPRESSION:   No acute findings in the abdomen or pelvis.   COGNITION: Overall cognitive status: Within functional limits for tasks assessed    COORDINATION: Limited in the L LE   LOWER EXTREMITY MMT:      MMT Right Eval Left Eval  Hip flexion 4 4  Hip extension      Hip abduction 4- 4-  Hip adduction 4- 4-  Hip internal rotation      Hip external rotation      Knee flexion 4- 3  Knee extension 4 4  Ankle dorsiflexion 3 2+  Ankle plantarflexion 3 3  Ankle inversion      Ankle eversion      (Blank rows = not tested)   TRANSFERS: Sit to stand: Complete Independence  Assistive device utilized: None      RAMP:  Not tested  CURB:  Not tested  STAIRS: Not tested GAIT: Gait pattern: L knee limited extension, L foot drop, L foot eversion Distance walked: 100' Assistive device utilized: None and with crutches Level of assistance: CGA Comments: Patient has increased eversion and rolling of L foot, LLE leg length discrepancy.   FUNCTIONAL TESTS:  From prior session 7 days ago: 5 times sit to stand: 5.6 seconds with minor use of LLE no UE support 6 minute walk test: 1200 ft 10 meter walk test: 7 seconds BERG: 36/56       PATIENT SURVEYS:  None performed at this time.  TREATMENT DATE: 05/07/24  TherAct:   In // bars:  Forward walking without AD in // bars, no UE assistance, x10  Walk forward/backwards without UE support 8x length  Lateral step length of bars x10 each direction;  cue for visual feedback    Lateral step ups onto 6 step, x10 each direction  Seated hip extensions in // bars, 2x10  Mini squats with UE support on // bar  Cable column walkouts in // bars, forward/latera/backward, 12.5#, x8 each direction     PATIENT EDUCATION: Education details: Pt educated on role of PT and services provided during current POC, along with prognosis and information about the clinic. Person educated: Patient Education method: Explanation Education comprehension: verbalized understanding  HOME EXERCISE PROGRAM: TBD  GOALS: Goals reviewed with patient? Yes  SHORT TERM GOALS: Target date: 05/28/2024  Pt will be independent with HEP in order to demonstrate increased ability to perform tasks related to occupation/hobbies. Baseline: Goal status: INITIAL   LONG TERM GOALS: Target date: 06/25/2024  Patient will increase lower extremity functional scale to >60/80 to demonstrate improved functional mobility and increased tolerance with ADLs.  Baseline: 04/23/24: 25/80; 31.3% Goal status: INITIAL  2.  Patient will increase six minute walk test distance to >1500 for progression to age norm community ambulator and improve gait ability  Baseline: 04/23/24: 1176' with B crutches  Goal status: INITIAL  3.  Pt will reduce overall core/abdominal pain level to 0/10 by utilizing a combination of stretching, strengthening exercises, and pain-reducing modalities in order to improve overall QoL. Baseline: 2/10 Goal status: INITIAL   ASSESSMENT:  CLINICAL IMPRESSION:  Pt responded well to the exercises and was able to perform with good effort.  Pt noted today to have been a good workout and felt that the cable column walkouts was beneficial for strengthening of the LE's.  Pt would likely benefit from the continued use of this to improve overall tolerance to the exercise and to build up the strength necessary for proper ambulation.   Pt will continue to benefit from skilled  therapy to address remaining deficits in order to improve overall QoL and return to PLOF.     OBJECTIVE IMPAIRMENTS: Abnormal gait, decreased activity tolerance, decreased balance, decreased coordination, decreased endurance, decreased mobility, difficulty walking, decreased ROM, decreased strength, hypomobility, impaired flexibility, improper body mechanics, and postural dysfunction. .   ACTIVITY LIMITATIONS: carrying, lifting, bending, standing, squatting, stairs, transfers, reach over head, locomotion level, and caring for others    PARTICIPATION LIMITATIONS: meal prep, cleaning, laundry, personal finances, driving, shopping, community activity, and yard work   PERSONAL FACTORS: Age, Past/current experiences, Time since onset of injury/illness/exacerbation, and 3+ comorbidities: DHD, arthritis, concussion COVID, HTN, multiple lacunar infarcts, MVP, pernicious anemia, rocky mountain spotted fever, spinal stenosis, vestibular migraine, vitamin B deficiency.  are also affecting patient's functional outcome.    REHAB POTENTIAL: Poor pt was discharged last week due to lack of progress and the upcoming back surgery that will occur.  CLINICAL DECISION MAKING: Unstable/unpredictable  EVALUATION COMPLEXITY: Moderate  PLAN:  PT FREQUENCY: 1x/week  PT DURATION: 8 weeks  PLANNED INTERVENTIONS: Therapeutic exercises, Therapeutic activity, Neuromuscular re-education, Balance training, Gait training, Patient/Family education, Self Care, Joint mobilization, Stair training, Vestibular training, Canalith repositioning, Visual/preceptual remediation/compensation, Orthotic/Fit training, DME instructions, Cognitive remediation, Electrical stimulation, Spinal mobilization, Cryotherapy, Moist heat, Splintting, Taping, Traction, Ultrasound, Manual therapy, and Re-evaluation   PLAN FOR NEXT SESSION: establish core exercises for surgical intervention.  Fonda Simpers, PT, DPT Physical Therapist - [place]  [facility]  05/07/24, 6:02 PM

## 2024-05-08 ENCOUNTER — Encounter

## 2024-05-08 DIAGNOSIS — F840 Autistic disorder: Secondary | ICD-10-CM

## 2024-05-08 DIAGNOSIS — R413 Other amnesia: Secondary | ICD-10-CM

## 2024-05-08 DIAGNOSIS — F02818 Dementia in other diseases classified elsewhere, unspecified severity, with other behavioral disturbance: Secondary | ICD-10-CM

## 2024-05-08 DIAGNOSIS — S069XAS Unspecified intracranial injury with loss of consciousness status unknown, sequela: Secondary | ICD-10-CM

## 2024-05-10 ENCOUNTER — Encounter: Payer: Self-pay | Admitting: Nurse Practitioner

## 2024-05-11 ENCOUNTER — Encounter: Payer: Self-pay | Admitting: Psychology

## 2024-05-11 ENCOUNTER — Encounter (HOSPITAL_BASED_OUTPATIENT_CLINIC_OR_DEPARTMENT_OTHER): Admitting: Psychology

## 2024-05-11 DIAGNOSIS — F840 Autistic disorder: Secondary | ICD-10-CM | POA: Diagnosis not present

## 2024-05-11 DIAGNOSIS — S069XAS Unspecified intracranial injury with loss of consciousness status unknown, sequela: Secondary | ICD-10-CM | POA: Diagnosis not present

## 2024-05-11 DIAGNOSIS — F02818 Dementia in other diseases classified elsewhere, unspecified severity, with other behavioral disturbance: Secondary | ICD-10-CM

## 2024-05-11 DIAGNOSIS — F03918 Unspecified dementia, unspecified severity, with other behavioral disturbance: Secondary | ICD-10-CM | POA: Diagnosis not present

## 2024-05-11 NOTE — Progress Notes (Signed)
 Behavioral Observations:  The patient appeared well-groomed and appropriately dressed. His manners were polite and appropriate to the situation. He ambulated with the assistance of crutches. The patient was somewhat talkative and distractible throughout the assessment and he often required redirection. His overall attitude towards testing was neutral, his effort was good.  Neuropsychology Note  [name] completed 240 minutes of neuropsychological testing with technician, Josue Ned, BA, under the supervision of Norleen Asa, PsyD., Clinical Neuropsychologist. The patient did not appear overtly distressed by the testing session, per behavioral observation or via self-report to the technician. Rest breaks were offered.   Clinical Decision Making: In considering the patient's current level of functioning, level of presumed impairment, nature of symptoms, emotional and behavioral responses during clinical interview, level of literacy, and observed level of motivation/effort, a battery of tests was selected by Dr. Asa during initial consultation on 04/30/2024. This was communicated to the technician. Communication between the neuropsychologist and technician was ongoing throughout the testing session and changes were made as deemed necessary based on patient performance on testing, technician observations and additional pertinent factors such as those listed above.  Tests Administered: Controlled Oral Word Association Test (COWAT; FAS & Animals)  Finger Tapping Test (FTT) Grooved Pegboard Wechsler Adult Intelligence Scale, 4th Edition (WAIS-IV) Wechsler Memory Scale, 4th Edition (WMS-IV); Adult Battery   Results:  COWAT:  FAS total= 49 Z= 0.80 Animals total= 14 Z= -0.80  FTT: *The patient reported that his finger became fatigued on the last couple of trials of this task. R (DH) Average= 52.4  Percentile Rank= 51st L (NDH) Average= 40.6  Percentile Rank=  39th  Grooved Pegboard:  *Tremor observed bilaterally R (DH) time= 82s Drops= 0  Percentile Rank= 44th L (NDH) time= 117s Drops= 0  Percentile Rank= 35th  WAIS-IV:   Composite Score Summary  Scale Sum of Scaled Scores Composite Score Percentile Rank 95% Conf. Interval Qualitative Description  Verbal Comprehension 45 VCI 130 98 123-134 Very Superior  Perceptual Reasoning 41 PRI 121 92 114-126 Superior  Working Memory 22 WMI 105 63 98-111 Average  Processing Speed 14 PSI 84 14 77-94 Low Average  Full Scale 122 FSIQ 115 84 111-119 High Average  General Ability 86 GAI 128 97 122-132 Superior    Verbal Comprehension Subtests Summary  Subtest Raw Score Scaled Score Percentile Rank Reference Group Scaled Score SEM  Similarities 33 15 95 15 1.08  Vocabulary 52 14 91 16 0.73  Information 24 16 98 17 0.67  The scaled scores in the Reference Group Scaled Score column are based on the performance of examinees aged 20:0-34:11 (i.e., the reference group). See Chapter 6 of the WAIS-IV Technical and Interpretive Manual for more information.  Perceptual Reasoning Subtests Summary  Subtest Raw Score Scaled Score Percentile Rank Reference Group Scaled Score SEM  Block Design 55 15 95 12 1.04  Matrix Reasoning 18 12 75 9 0.95  Visual Puzzles 17 14 91 11 0.99   Working Librarian, academic Raw Score Scaled Score Percentile Rank Reference Group Scaled Score SEM  Digit Span 30 12 75 11 0.85  Arithmetic 14 10 50 10 1.04   Processing Speed Subtests Summary  Subtest Raw Score Scaled Score Percentile Rank Reference Group Scaled Score SEM  Symbol Search 19 6 9 5  1.31  Coding 52 8 25 6  0.99   WMS-IV:   Index Score Summary  Index Sum of Scaled Scores Index Score Percentile Rank 95% Confidence Interval Qualitative Descriptor  Auditory  Memory (AMI) 33 90 25 84-97 Average  Visual Memory (VMI) 47 110 75 104-115 High Average  Visual Working Memory (VWMI) 17 91 27 84-99 Average   Immediate Memory (IMI) 37 94 34 88-101 Average  Delayed Memory (DMI) 43 106 66 99-112 Average    Primary Subtest Scaled Score Summary  Subtest Domain Raw Score Scaled Score Percentile Rank  Logical Memory I AM 14 5 5   Logical Memory II AM 17 8 25   Verbal Paired Associates I AM 22 8 25   Verbal Paired Associates II AM 11 12 75  Designs I VM 67 10 50  Designs II VM 55 11 63  Visual Reproduction I VM 41 14 91  Visual Reproduction II VM 30 12 75  Spatial Addition VWM 8 8 25   Symbol Span VWM 18 9 37   Auditory Memory Process Score Summary  Process Score Raw Score Scaled Score Percentile Rank Cumulative Percentage (Base Rate)  LM II Recognition 25 - - 51-75%  VPA II Recognition 39 - - 51-75%   Visual Memory Process Score Summary  Process Score Raw Score Scaled Score Percentile Rank Cumulative Percentage (Base Rate)  DE I Content 30 8 25  -  DE I Spatial 13 8 25  -  DE II Content 33 10 50 -  DE II Spatial 10 9 37 -  DE II Recognition 17 - - >75%  VR II Recognition 7 - - >75%   ABILITY-MEMORY ANALYSIS  Ability Score:  VCI: 130 Date of Testing:  WAIS-IV; WMS-IV 2024/05/08  Predicted Difference Method   Index Predicted WMS-IV Index Score Actual WMS-IV Index Score Difference Critical Value  Significant Difference Y/N Base Rate  Auditory Memory 116 90 26 9.00 Y 2-3%  Visual Memory 114 110 4 8.38 N   Visual Working Memory 117 91 26 10.86 Y 2%  Immediate Memory 117 94 23 10.12 Y 3%  Delayed Memory 115 106 9 9.95 N   Statistical significance (critical value) at the .01 level.    Feedback to Patient: [name] will return on 09/07/2024 for an interactive feedback session with Dr. Corina at which time his test performances, clinical impressions and treatment recommendations will be reviewed in detail. The patient understands he can contact our office should he require our assistance before this time.  240 minutes spent face-to-face with patient administering  standardized tests, 30 minutes spent scoring Radiographer, therapeutic). [CPT [id number], [id number]]  Full report to follow.
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
## 2024-05-12 ENCOUNTER — Ambulatory Visit

## 2024-05-13 ENCOUNTER — Ambulatory Visit: Admitting: Neurosurgery

## 2024-05-14 ENCOUNTER — Ambulatory Visit: Admitting: Occupational Therapy

## 2024-05-14 ENCOUNTER — Ambulatory Visit

## 2024-05-18 MED ORDER — AMLODIPINE BESYLATE 5 MG PO TABS: 5.0000 mg | ORAL_TABLET | Freq: Every day | ORAL | 3 refills | Status: AC

## 2024-05-21 ENCOUNTER — Ambulatory Visit: Admitting: Occupational Therapy

## 2024-05-21 ENCOUNTER — Ambulatory Visit

## 2024-05-21 ENCOUNTER — Encounter: Admitting: Occupational Therapy

## 2024-05-21 DIAGNOSIS — F0670 Mild neurocognitive disorder due to known physiological condition without behavioral disturbance: Secondary | ICD-10-CM | POA: Diagnosis not present

## 2024-05-21 DIAGNOSIS — R4681 Obsessive-compulsive behavior: Secondary | ICD-10-CM | POA: Diagnosis not present

## 2024-05-24 ENCOUNTER — Telehealth: Admitting: Family Medicine

## 2024-05-24 ENCOUNTER — Encounter: Payer: Self-pay | Admitting: Neurosurgery

## 2024-05-24 ENCOUNTER — Encounter: Payer: Self-pay | Admitting: Nurse Practitioner

## 2024-05-24 DIAGNOSIS — U071 COVID-19: Secondary | ICD-10-CM

## 2024-05-24 MED ORDER — NIRMATRELVIR/RITONAVIR (PAXLOVID)TABLET: 3.0000 | ORAL_TABLET | Freq: Two times a day (BID) | ORAL | 0 refills | Status: AC

## 2024-05-24 NOTE — Progress Notes (Signed)
 Virtual Visit Consent   [name], you are scheduled for a virtual visit with a [facility] provider today. Just as with appointments in the office, your consent must be obtained to participate. Your consent will be active for this visit and any virtual visit you may have with one of our providers in the next 365 days. If you have a MyChart account, a copy of this consent can be sent to you electronically.  As this is a virtual visit, video technology does not allow for your provider to perform a traditional examination. This may limit your provider's ability to fully assess your condition. If your provider identifies any concerns that need to be evaluated in person or the need to arrange testing (such as labs, EKG, etc.), we will make arrangements to do so. Although advances in technology are sophisticated, we cannot ensure that it will always work on either your end or our end. If the connection with a video visit is poor, the visit may have to be switched to a telephone visit. With either a video or telephone visit, we are not always able to ensure that we have a secure connection.  By engaging in this virtual visit, you consent to the provision of healthcare and authorize for your insurance to be billed (if applicable) for the services provided during this visit. Depending on your insurance coverage, you may receive a charge related to this service.  I need to obtain your verbal consent now. Are you willing to proceed with your visit today? LOVE MILBOURNE has provided verbal consent on 05/24/2024 for a virtual visit (video or telephone). Loa Lamp, FNP  Date: 05/24/2024 11:30 AM   Virtual Visit via Video Note   I, Loa Lamp, connected with  JEROL RUFENER  ([id number], 03-18-1961) on 05/24/24 at 11:30 AM EDT by a video-enabled telemedicine application and verified that I am speaking with the correct person using two identifiers.  Location: Patient: Virtual Visit Location  Patient: Home Provider: Virtual Visit Location Provider: Home Office   I discussed the limitations of evaluation and management by telemedicine and the availability of in person appointments. The patient expressed understanding and agreed to proceed.    History of Present Illness: SKIP LITKE is a 63 y.o. who identifies as a male who was assigned male at birth, and is being seen today for positive covid testing with sore throat, cough, body aches since yesterday. In no distress. He has had paxlovid  in the past. .  HPI: HPI  Problems:  Patient Active Problem List   Diagnosis Date Noted   Cubital tunnel syndrome on right 02/16/2024   Unintentional weight loss 02/12/2024   Lacunar stroke (HCC) 12/12/2023   Essential tremor 12/12/2023   Dizziness 10/30/2023   Postoperative hematoma of musculoskeletal structure following musculoskeletal procedure 08/01/2022   Cervical myelopathy (HCC) 07/30/2022   Cervical spinal stenosis 07/30/2022   Radiculopathy, cervical 07/30/2022   S/P cervical spinal fusion 07/30/2022   Vitamin D  deficiency 06/25/2022   B12 deficiency 06/25/2022   Elevated LDL cholesterol level 06/25/2022   Prediabetes 03/27/2021   History of nausea- reported 01/27/2020 02/02/2020   Accidental fall from ladder 02/02/2020   Acute right-sided thoracic back pain 02/02/2020   Neck stiffness 02/02/2020   Concussion with no loss of consciousness 02/02/2020   Multiple falls 02/02/2020   Head trauma, initial encounter 02/02/2020   Acute post-traumatic headache, not intractable 02/02/2020   Essential hypertension 08/26/2018   Migraine with vertigo 01/05/2014   Arthralgia  of multiple joints 04/28/2009   Carpal tunnel syndrome 08/30/2008    Allergies:  Allergies  Allergen Reactions   Lisinopril  Other (See Comments)    Side effects   Propranolol  Other (See Comments)    Side effects   Medications:  Current Outpatient Medications:    nirmatrelvir /ritonavir  (PAXLOVID ) 20 x 150  MG & 10 x 100MG  TABS, Take 3 tablets by mouth 2 (two) times daily for 5 days. (Take nirmatrelvir  150 mg two tablets twice daily for 5 days and ritonavir  100 mg one tablet twice daily for 5 days) Patient GFR is normal, Disp: 30 tablet, Rfl: 0   acetaminophen  (TYLENOL ) 500 MG tablet, Take 500 mg by mouth every 6 (six) hours as needed., Disp: , Rfl:    amLODipine  (NORVASC ) 5 MG tablet, Take 1 tablet (5 mg total) by mouth daily., Disp: 90 tablet, Rfl: 3   aspirin EC 325 MG tablet, Take 325 mg by mouth as needed (Pain)., Disp: , Rfl:    aspirin EC 81 MG tablet, Take 81 mg by mouth daily. Swallow whole., Disp: , Rfl:    chlorhexidine  (PERIDEX ) 0.12 % solution, Use as directed 5 mLs in the mouth or throat 2 (two) times daily. FLOSS AND BRUSH TEETH. SWISH 1 CAPFUL IN MOUTH THEN EXPECTORATE. DO NOT EAT/DRINK 2-3 HOURS AFTER, Disp: , Rfl:    Cholecalciferol  (VITAMIN D3 PO), Take 5,000 Int'l Units by mouth daily., Disp: , Rfl:    Cyanocobalamin  (VITAMIN B 12 PO), Take 5,000 mcg by mouth daily., Disp: , Rfl:    Cyanocobalamin  1000 MCG/ML KIT, Inject into the muscle. Every three months, Disp: , Rfl:    diazepam  (VALIUM ) 2 MG tablet, Take 2 mg by mouth every 12 (twelve) hours as needed for anxiety or muscle spasms., Disp: , Rfl:    DM-APAP-CPM (CORICIDIN HBP PO), Take by mouth as needed., Disp: , Rfl:    Docusate Sodium  (COLACE PO), Take by mouth daily. Daily, Disp: , Rfl:    hydrochlorothiazide  (MICROZIDE ) 12.5 MG capsule, Take 1 capsule (12.5 mg total) by mouth daily., Disp: 90 capsule, Rfl: 3   LORazepam  (ATIVAN ) 0.5 MG tablet, Take 1 tablet (0.5 mg total) by mouth 2 (two) times daily as needed for anxiety., Disp: 30 tablet, Rfl: 0   mirtazapine  (REMERON  SOL-TAB) 30 MG disintegrating tablet, Take 1 tablet (30 mg total) by mouth at bedtime., Disp: 90 tablet, Rfl: 1   omeprazole (PRILOSEC) 20 MG capsule, Take 20 mg by mouth daily., Disp: , Rfl:    pyridOXINE (B-6) 50 MG tablet, Take 50 mg by mouth daily., Disp: ,  Rfl:    rizatriptan  (MAXALT ) 10 MG tablet, Take 1 tablet (10 mg total) by mouth as needed for migraine. May repeat in 2 hours if needed, Disp: 10 tablet, Rfl: 0  Observations/Objective: Patient is well-developed, well-nourished in no acute distress.  Resting comfortably  at home.  Head is normocephalic, atraumatic.  No labored breathing.  Speech is clear and coherent with logical content.  Patient is alert and oriented at baseline.    Assessment and Plan: 1. COVID (Primary)  Increase fluids, humidifier at night, tylenol  or ibuprofen  as directed, UC as needed.   Follow Up Instructions: I discussed the assessment and treatment plan with the patient. The patient was provided an opportunity to ask questions and all were answered. The patient agreed with the plan and demonstrated an understanding of the instructions.  A copy of instructions were sent to the patient via MyChart unless otherwise noted below.  The patient was advised to call back or seek an in-person evaluation if the symptoms worsen or if the condition fails to improve as anticipated.    Soriya Worster, FNP

## 2024-05-24 NOTE — Patient Instructions (Signed)
 COVID-19: What to Know COVID-19 is an infection caused by a virus called SARS-CoV-2. This type of virus is called a coronavirus. People with COVID-19 may: Have few to no symptoms. Have mild to moderate symptoms that affect their lungs and breathing. Get very sick. What are the causes?  COVID-19 is caused by a virus. This virus may be in the air as droplets or on surfaces. It can spread from an infected person when they cough, sneeze, speak, sing, or breathe. You may become infected if: You breathe in the infected droplets in the air. You touch an object that has the virus on it. What increases the risk? You are at risk of getting COVID-19 if you have been around someone with the infection. You may be more likely to get very sick if: You are 57 years old or older. You have certain medical conditions, such as: Heart disease. Diabetes. Long-term respiratory disease. Cancer. Pregnancy. You are immunocompromised. This means your body can't fight infections easily. You have a disability that makes it hard for you to move around, you have trouble moving, or you can't move at all. What are the signs or symptoms? People may have different symptoms from COVID-19. The symptoms can also be mild to very bad. They often show up in 5-6 days after being infected. But, they can take up to 14 days to appear. Common symptoms are: Cough. Feeling tired. New loss of taste or smell. Fever. Less common symptoms are: Sore throat. Headache. Body or muscle aches. Diarrhea. A skin rash or fingers or toes that are a different color than usual. Red or irritated eyes. Sometimes, COVID-19 does not cause symptoms. How is this diagnosed? COVID-19 can be diagnosed with tests done in the lab or at home. Fluid from your nose, mouth, or lungs will be used to check for the virus. How is this treated? Treatment for COVID-19 depends on how sick you are. Mild symptoms can be treated at home with rest, fluids, and  over-the-counter medicines. very bad symptoms may be treated in a hospital intensive care unit (ICU). If you have symptoms and are at risk of getting very sick, you may be given a medicine that fights viruses. This medicine is called an antiviral. How is this prevented? To protect yourself from COVID-19: Know your risk factors. Get vaccinated. If your body can't fight infections easily, talk to your provider about treatment to help prevent COVID-19. Stay at least about 3 feet (1 meter) away from other people. Wear mask that fits well when: You can't stay at a distance from people. You're in a place with not a lot of air flow. Try to be in open spaces with good air flow when you are in public. Wash your hands often or use an alcohol-based hand sanitizer. Cover your nose and mouth when you cough or sneeze. If you think you have COVID-19 or have been around someone who has it, stay home and away from other people as told by your provider or health officials. Where to find more information To learn more: Go to [employer] Click Health Topics. Type COVID-19 in the search box. Go to VisitDestination.com.br Click Health Topics. Then click All Topics. Type COVID-19 in the search box. Get help right away if: You have trouble breathing or get short of breath. You have pain or pressure in your chest. You're feeling confused. These symptoms may be an emergency. Get help right away. Call 911. Do not wait to see if the symptoms will go away.  Do not drive yourself to the hospital. This information is not intended to replace advice given to you by your health care provider. Make sure you discuss any questions you have with your health care provider. Document Revised: 05/09/2023 Document Reviewed: 05/01/2023 Elsevier Patient Education  2025 [employer].

## 2024-05-25 ENCOUNTER — Telehealth: Payer: Self-pay

## 2024-05-25 NOTE — Telephone Encounter (Signed)
 Copied from CRM #[id number]. Topic: General - Other >> May 25, 2024 12:05 PM Yolanda T wrote: Reason for CRM: patients wife called stated patient has covid and will not have his surgery on 10/22. He has not gained any weight since his last appt and spouse is wanting to know if she needs to reschedule his 10/13 appt or if he will still need to come in. Please f/u with De[facility].

## 2024-05-25 NOTE — Telephone Encounter (Signed)
 Called and LVM notifying patient's wife of Karen's message. Asked for patient's wife to please give us  a call back to reschedule the appointment.

## 2024-05-25 NOTE — Telephone Encounter (Signed)
Routing to provider to advise on appointment

## 2024-05-25 NOTE — Telephone Encounter (Signed)
 Recommend patient reschedule for 1 month to work on weight gain prior to surgery.

## 2024-05-27 ENCOUNTER — Telehealth: Payer: Self-pay

## 2024-05-27 NOTE — Telephone Encounter (Signed)
 Due to positive COVID testing, ALIF/PSF surgery had to be rescheduled from 06/10/24 to 07/29/24. Contacted OR scheduling team to have procedure moved on the Snapboard. Janese Gu, MD and Schmeir, MD for coordination. Notified reps for [facility] and [facility]. Post op appointments changed. Updated PCP clearance letter and refaxed to [facility], NP. Sent mychart message to wife to notify of post op changes and provide telephone number for pre-op testing to schedule a new PAT appointment. [employer] called to update date of service and new authorization letter obtained.

## 2024-05-28 ENCOUNTER — Ambulatory Visit: Admitting: Occupational Therapy

## 2024-05-28 ENCOUNTER — Ambulatory Visit

## 2024-05-28 ENCOUNTER — Encounter: Admitting: Occupational Therapy

## 2024-05-28 DIAGNOSIS — R4681 Obsessive-compulsive behavior: Secondary | ICD-10-CM | POA: Diagnosis not present

## 2024-05-28 DIAGNOSIS — F0670 Mild neurocognitive disorder due to known physiological condition without behavioral disturbance: Secondary | ICD-10-CM | POA: Diagnosis not present

## 2024-06-01 ENCOUNTER — Ambulatory Visit: Admitting: Nurse Practitioner

## 2024-06-02 ENCOUNTER — Other Ambulatory Visit

## 2024-06-03 ENCOUNTER — Other Ambulatory Visit

## 2024-06-04 ENCOUNTER — Ambulatory Visit: Admitting: Occupational Therapy

## 2024-06-04 ENCOUNTER — Ambulatory Visit

## 2024-06-04 DIAGNOSIS — F0670 Mild neurocognitive disorder due to known physiological condition without behavioral disturbance: Secondary | ICD-10-CM | POA: Diagnosis not present

## 2024-06-04 DIAGNOSIS — R4681 Obsessive-compulsive behavior: Secondary | ICD-10-CM | POA: Diagnosis not present

## 2024-06-04 DIAGNOSIS — F840 Autistic disorder: Secondary | ICD-10-CM | POA: Diagnosis not present

## 2024-06-09 ENCOUNTER — Ambulatory Visit

## 2024-06-11 ENCOUNTER — Ambulatory Visit: Admitting: Occupational Therapy

## 2024-06-11 ENCOUNTER — Ambulatory Visit

## 2024-06-11 DIAGNOSIS — F840 Autistic disorder: Secondary | ICD-10-CM | POA: Diagnosis not present

## 2024-06-11 DIAGNOSIS — R4681 Obsessive-compulsive behavior: Secondary | ICD-10-CM | POA: Diagnosis not present

## 2024-06-11 DIAGNOSIS — F0670 Mild neurocognitive disorder due to known physiological condition without behavioral disturbance: Secondary | ICD-10-CM | POA: Diagnosis not present

## 2024-06-17 ENCOUNTER — Inpatient Hospital Stay: Admission: RE | Admit: 2024-06-17

## 2024-06-17 ENCOUNTER — Ambulatory Visit
Admission: EM | Admit: 2024-06-17 | Discharge: 2024-06-17 | Disposition: A | Discharge status: Home / Self Care | Attending: Family Medicine | Admitting: Family Medicine

## 2024-06-17 DIAGNOSIS — H00014 Hordeolum externum left upper eyelid: Secondary | ICD-10-CM | POA: Diagnosis not present

## 2024-06-17 MED ORDER — ERYTHROMYCIN 5 MG/GM OP OINT: TOPICAL_OINTMENT | OPHTHALMIC | 0 refills | Status: DC

## 2024-06-17 NOTE — ED Provider Notes (Signed)
 [facility]    CSN: [id number] Arrival date & time: 06/17/24  1331      History   Chief Complaint Chief Complaint  Patient presents with   Eye Problem    HPI HPI  [name] is a 63 y.o. male.    [name] presents for left eye swelling and pain that started 1 day ago. He woke up yesterday with his eyelid red and swollen.  His wife asked him to be seen if his eye swelling got worse.  No blurry vision. [name] does not feel like anything is in his eye.  [name] does not wear glasses.  [name] has not had any  discharge from the eye.  [name] has otherwise been well and has no additional concerns today.   Past Medical History:  Diagnosis Date   ADHD    Arthritis    Concussion    COVID 12/2020   Hypertension    Multiple lacunar infarcts (HCC)    MVP (mitral valve prolapse)    Pernicious anemia    [place] spotted fever    Spinal stenosis    Stroke [facility])    Vestibular migraine    Vitamin B12 deficiency    Vitamin D  deficiency     Patient Active Problem List   Diagnosis Date Noted   Cubital tunnel syndrome on right 02/16/2024   Unintentional weight loss 02/12/2024   Lacunar stroke (HCC) 12/12/2023   Essential tremor 12/12/2023   Dizziness 10/30/2023   Postoperative hematoma of musculoskeletal structure following musculoskeletal procedure 08/01/2022   Cervical myelopathy (HCC) 07/30/2022   Cervical spinal stenosis 07/30/2022   Radiculopathy, cervical 07/30/2022   S/P cervical spinal fusion 07/30/2022   Vitamin D  deficiency 06/25/2022   B12 deficiency 06/25/2022   Elevated LDL cholesterol level 06/25/2022   Prediabetes 03/27/2021   History of nausea- reported 01/27/2020 02/02/2020   Accidental fall from ladder 02/02/2020   Acute right-sided thoracic back pain 02/02/2020   Neck stiffness 02/02/2020   Concussion with no loss of consciousness 02/02/2020   Multiple falls 02/02/2020   Head trauma, initial encounter 02/02/2020   Acute  post-traumatic headache, not intractable 02/02/2020   Essential hypertension 08/26/2018   Migraine with vertigo 01/05/2014   Arthralgia of multiple joints 04/28/2009   Carpal tunnel syndrome 08/30/2008    Past Surgical History:  Procedure Laterality Date   ANTERIOR CERVICAL DECOMP/DISCECTOMY FUSION N/A 07/30/2022   Procedure: C4-7 ANTERIOR CERVICAL DISCECTOMY AND FUSION (GLOBUS HEDRON);  Surgeon: Clois Fret, MD;  Location: [facility] ORS;  Service: Neurosurgery;  Laterality: N/A;   BILATERAL CARPAL TUNNEL RELEASE     CERVICAL WOUND DEBRIDEMENT N/A 08/01/2022   Procedure: CERVICAL WOUND DEBRIDEMENT OF HEMATOMA;  Surgeon: Clois Fret, MD;  Location: [facility] ORS;  Service: Neurosurgery;  Laterality: N/A;   COLONOSCOPY     COLONOSCOPY WITH PROPOFOL  N/A 12/17/2023   Procedure: COLONOSCOPY WITH PROPOFOL ;  Surgeon: Maryruth Ole DASEN, MD;  Location: [facility] END[facility];  Service: Endoscopy;  Laterality: N/A;   ESOPHAGOGASTRODUODENOSCOPY (EGD) WITH PROPOFOL  N/A 12/17/2023   Procedure: ESOPHAGOGASTRODUODENOSCOPY (EGD) WITH PROPOFOL ;  Surgeon: Maryruth Ole DASEN, MD;  Location: [facility] END[facility];  Service: Endoscopy;  Laterality: N/A;   FRACTURE SURGERY Left 1984   intramedullary rod   hip bone transplant  1984   LEG SURGERY Left    oseotomy   POLYPECTOMY  12/17/2023   Procedure: POLYPECTOMY, INTESTINE;  Surgeon: Maryruth Ole DASEN, MD;  Location: [facility] ENDOSCOPY;  Service: Endoscopy;;   REPAIR ANKLE LIGAMENT Right 1978   REPAIR KNEE  LIGAMENT Bilateral    acl   TRIGGER FINGER RELEASE Bilateral    thumbs       Home Medications    Prior to Admission medications   Medication Sig Start Date End Date Taking? Authorizing Provider  erythromycin ophthalmic ointment Place a 1/2 inch ribbon of ointment into the lower eyelid. Use 3 times a day for 7 days. 06/17/24  Yes Daymein Nunnery, DO  acetaminophen  (TYLENOL ) 500 MG tablet Take 500 mg by mouth every 6 (six) hours as needed.    [provider]  amLODipine  (NORVASC ) 5 MG tablet Take 1 tablet (5 mg total) by mouth daily. 05/18/24   Loistine Sober, NP  aspirin EC 325 MG tablet Take 325 mg by mouth as needed (Pain).    [provider]  aspirin EC 81 MG tablet Take 81 mg by mouth daily. Swallow whole.    [provider]  carbidopa-levodopa (SINEMET IR) 25-100 MG tablet Take 1 tablet by mouth 3 (three) times daily.    [provider]  chlorhexidine  (PERIDEX ) 0.12 % solution Use as directed 5 mLs in the mouth or throat 2 (two) times daily. FLOSS AND BRUSH TEETH. SWISH 1 CAPFUL IN MOUTH THEN EXPECTORATE. DO NOT EAT/DRINK 2-3 HOURS AFTER 03/11/24   [provider]  Cholecalciferol  (VITAMIN D3 PO) Take 5,000 Int'l Units by mouth daily.    [provider]  Cyanocobalamin  (VITAMIN B 12 PO) Take 5,000 mcg by mouth daily.    [provider]  Cyanocobalamin  1000 MCG/ML KIT Inject into the muscle. Every three months 06/04/22   [provider]  diazepam  (VALIUM ) 2 MG tablet Take 2 mg by mouth every 12 (twelve) hours as needed for anxiety or muscle spasms. 02/25/24   [provider]  DM-APAP-CPM (CORICIDIN HBP PO) Take by mouth as needed.    [provider]  Docusate Sodium  (COLACE PO) Take by mouth daily. Daily    [provider]  hydrochlorothiazide  (MICROZIDE ) 12.5 MG capsule Take 1 capsule (12.5 mg total) by mouth daily. 12/11/23 04/22/24  End, Lonni, MD  LORazepam  (ATIVAN ) 0.5 MG tablet Take 1 tablet (0.5 mg total) by mouth 2 (two) times daily as needed for anxiety. 10/30/23   Melvin Pao, NP  mirtazapine  (REMERON  SOL-TAB) 30 MG disintegrating tablet Take 1 tablet (30 mg total) by mouth at bedtime. 04/23/24   Melvin Pao, NP  omeprazole (PRILOSEC) 20 MG capsule Take 20 mg by mouth daily.    [provider]  pyridOXINE (B-6) 50 MG tablet Take 50 mg by mouth daily.    [provider]  rizatriptan  (MAXALT ) 10 MG tablet  Take 1 tablet (10 mg total) by mouth as needed for migraine. May repeat in 2 hours if needed 08/02/21   Melvin Pao, NP    Family History Family History  Problem Relation Age of Onset   Breast cancer Mother    Thyroid  cancer Mother    Lymphoma Father    CVA Father    Heart disease Sister        Dysautonomia   Hypertension Sister    Heart attack Maternal Grandfather     Social History Social History   Tobacco Use   Smoking status: Never   Smokeless tobacco: Never  Vaping Use   Vaping status: Never Used  Substance Use Topics   Alcohol use: Not Currently    Comment: 1 beer per month   Drug use: No     Allergies   Lisinopril  and Propranolol   Review of Systems Review of Systems : negative unless otherwise stated in HPI.      Physical Exam Triage Vital Signs ED Triage Vitals  Encounter Vitals Group     BP      Girls Systolic BP Percentile      Girls Diastolic BP Percentile      Boys Systolic BP Percentile      Boys Diastolic BP Percentile      Pulse      Resp      Temp      Temp src      SpO2      Weight      Height      Head Circumference      Peak Flow      Pain Score      Pain Loc      Pain Education      Exclude from Growth Chart    No data found.  Updated Vital Signs BP (!) 165/105 (BP Location: Left Arm)   Pulse 99   Temp 99.2 F (37.3 C) (Oral)   Resp 18   Ht 5' 7 (1.702 m)   Wt 62.1 kg   SpO2 95%   BMI 21.46 kg/m   Visual Acuity Right Eye Distance: 20/25 Left Eye Distance: 20/30 Bilateral Distance: 20/25 (w/correction)  Right Eye Near:   Left Eye Near:    Bilateral Near:     Physical Exam  GEN: pleasant  male, in no acute distress  NECK: normal ROM  CV: regular rate  RESP: no increased work of breathing EYES:     General: Lids are normal. Lids are everted, no foreign bodies appreciated. Vision grossly intact. Gaze aligned appropriately.        Right eye: No foreign body, discharge or hordeolum.        Left eye:  No foreign body or discharge. External upper hordeolum.     Extraocular Movements: Extraocular movements intact.     PERRLA     Conjunctiva/sclera:     Left eye: Left conjunctiva is no injected. No chemosis or hemorrhage.    Comments: fluorescein stain deferred  SKIN: warm and dry   UC Treatments / Results  Labs (all labs ordered are listed, but only abnormal results are displayed) Labs Reviewed - No data to display  EKG   Radiology No results found.  Procedures Procedures (including critical care time)  Medications Ordered in UC Medications - No data to display  Initial Impression / Assessment and Plan / UC Course  I have reviewed the triage vital signs and the nursing notes.  Pertinent labs & imaging results that were available during my care of the patient were reviewed by me and considered in my medical decision making (see chart for details).      Hordeolum  Patient is a 63 y.o. male who presents after left eyelid swelling with redness for the past day.  On exam, he has evidence of external hordeolum on the left. Treat with erythromycin ointment 3 times daily for 7 days.  Advised to follow-up with an ophthalmologist or optometrist, if  discomfort/pain is not improving after 7day course. Understanding voiced.     Discussed MDM, treatment plan and plan for follow-up with patient/parent who agrees with plan.  Final Clinical Impressions(s) / UC Diagnoses   Final diagnoses:  Hordeolum externum of left upper eyelid     Discharge Instructions      You have a stye, also called a hordeolum. See  handout for more information. Stop by the pharmacy to pick up your antibiotic eye medication.  Place a warm clothe or tea bag on you eye.  Follow up with your primary eyecare provider or [facility] if symptoms suddenly worsen or you have little improvement in your eye symptoms.       ED Prescriptions     Medication Sig Dispense Auth. Provider   erythromycin  ophthalmic ointment Place a 1/2 inch ribbon of ointment into the lower eyelid. Use 3 times a day for 7 days. 3.5 g Parmvir Boomer, DO      PDMP not reviewed this encounter.   Krystle Oberman, DO 06/17/24 1424

## 2024-06-17 NOTE — ED Triage Notes (Signed)
 Pt c/o L eye pain & swelling x1 day. States woke up today w/eye shut. Denies any trauma. No OTC meds.

## 2024-06-17 NOTE — Discharge Instructions (Addendum)
 You have a stye, also called a hordeolum. See handout for more information. Stop by the pharmacy to pick up your antibiotic eye medication.  Place a warm clothe or tea bag on you eye.  Follow up with your primary eyecare provider or [facility] if symptoms suddenly worsen or you have little improvement in your eye symptoms.

## 2024-06-18 ENCOUNTER — Encounter: Admitting: Occupational Therapy

## 2024-06-18 ENCOUNTER — Ambulatory Visit

## 2024-06-19 ENCOUNTER — Ambulatory Visit
Admission: EM | Admit: 2024-06-19 | Discharge: 2024-06-19 | Disposition: A | Discharge status: Home / Self Care | Attending: Family Medicine | Admitting: Family Medicine

## 2024-06-19 DIAGNOSIS — H00014 Hordeolum externum left upper eyelid: Secondary | ICD-10-CM

## 2024-06-19 MED ORDER — AMOXICILLIN-POT CLAVULANATE 875-125 MG PO TABS: 1.0000 | ORAL_TABLET | Freq: Two times a day (BID) | ORAL | 0 refills | Status: DC

## 2024-06-19 MED ORDER — HYDROCORTISONE 1 % EX CREA: TOPICAL_CREAM | CUTANEOUS | 0 refills | Status: DC

## 2024-06-19 NOTE — ED Triage Notes (Signed)
 Pt present left eye swelling, symptom started on 06/17/2024. Pt was recently seen on 06/17/2024

## 2024-06-19 NOTE — ED Provider Notes (Addendum)
 [facility]    CSN: [id number] Arrival date & time: 06/19/24  1833      History   Chief Complaint Chief Complaint  Patient presents with   Eye Problem    HPI HPI  CANUTO KINGSTON is a 63 y.o. male.    Reda presents for left eye swelling and pain that started 3 days ago. He woke up with his eyelid red and swollen. He was seen here 2 days ago and started on a topical antibiotic ointment. He has been applying warm compresses.   No blurry vision. Fenton does not feel like anything is in his eye.  Alano does not wear glasses.  Mathews has otherwise been well and has no additional concerns today.  Past Medical History:  Diagnosis Date   ADHD    Arthritis    Concussion    COVID 12/2020   Hypertension    Multiple lacunar infarcts (HCC)    MVP (mitral valve prolapse)    Pernicious anemia    [place] spotted fever    Spinal stenosis    Stroke [facility])    Vestibular migraine    Vitamin B12 deficiency    Vitamin D  deficiency     Patient Active Problem List   Diagnosis Date Noted   Cubital tunnel syndrome on right 02/16/2024   Unintentional weight loss 02/12/2024   Lacunar stroke (HCC) 12/12/2023   Essential tremor 12/12/2023   Dizziness 10/30/2023   Postoperative hematoma of musculoskeletal structure following musculoskeletal procedure 08/01/2022   Cervical myelopathy (HCC) 07/30/2022   Cervical spinal stenosis 07/30/2022   Radiculopathy, cervical 07/30/2022   S/P cervical spinal fusion 07/30/2022   Vitamin D  deficiency 06/25/2022   B12 deficiency 06/25/2022   Elevated LDL cholesterol level 06/25/2022   Prediabetes 03/27/2021   History of nausea- reported 01/27/2020 02/02/2020   Accidental fall from ladder 02/02/2020   Acute right-sided thoracic back pain 02/02/2020   Neck stiffness 02/02/2020   Concussion with no loss of consciousness 02/02/2020   Multiple falls 02/02/2020   Head trauma, initial encounter 02/02/2020   Acute post-traumatic  headache, not intractable 02/02/2020   Essential hypertension 08/26/2018   Migraine with vertigo 01/05/2014   Arthralgia of multiple joints 04/28/2009   Carpal tunnel syndrome 08/30/2008    Past Surgical History:  Procedure Laterality Date   ANTERIOR CERVICAL DECOMP/DISCECTOMY FUSION N/A 07/30/2022   Procedure: C4-7 ANTERIOR CERVICAL DISCECTOMY AND FUSION (GLOBUS HEDRON);  Surgeon: Clois Fret, MD;  Location: [facility] ORS;  Service: Neurosurgery;  Laterality: N/A;   BILATERAL CARPAL TUNNEL RELEASE     CERVICAL WOUND DEBRIDEMENT N/A 08/01/2022   Procedure: CERVICAL WOUND DEBRIDEMENT OF HEMATOMA;  Surgeon: Clois Fret, MD;  Location: [facility] ORS;  Service: Neurosurgery;  Laterality: N/A;   COLONOSCOPY     COLONOSCOPY WITH PROPOFOL  N/A 12/17/2023   Procedure: COLONOSCOPY WITH PROPOFOL ;  Surgeon: Maryruth Ole DASEN, MD;  Location: [facility] ENDOSCOPY;  Service: Endoscopy;  Laterality: N/A;   ESOPHAGOGASTRODUODENOSCOPY (EGD) WITH PROPOFOL  N/A 12/17/2023   Procedure: ESOPHAGOGASTRODUODENOSCOPY (EGD) WITH PROPOFOL ;  Surgeon: Maryruth Ole DASEN, MD;  Location: [facility] ENDOSCOPY;  Service: Endoscopy;  Laterality: N/A;   FRACTURE SURGERY Left 1984   intramedullary rod   hip bone transplant  1984   LEG SURGERY Left    oseotomy   POLYPECTOMY  12/17/2023   Procedure: POLYPECTOMY, INTESTINE;  Surgeon: Maryruth Ole DASEN, MD;  Location: [facility] ENDOSCOPY;  Service: Endoscopy;;   REPAIR ANKLE LIGAMENT Right 1978   REPAIR KNEE LIGAMENT Bilateral    acl  TRIGGER FINGER RELEASE Bilateral    thumbs       Home Medications    Prior to Admission medications   Medication Sig Start Date End Date Taking? Authorizing Provider  amoxicillin-clavulanate (AUGMENTIN) 875-125 MG tablet Take 1 tablet by mouth every 12 (twelve) hours. 06/19/24  Yes Hellena Pridgen, DO  hydrocortisone cream 1 % Apply to affected area 2 times daily 06/19/24  Yes Karthik Whittinghill, DO  acetaminophen  (TYLENOL ) 500 MG tablet Take 500  mg by mouth every 6 (six) hours as needed.    [provider]  amLODipine  (NORVASC ) 5 MG tablet Take 1 tablet (5 mg total) by mouth daily. 05/18/24   Loistine Sober, NP  aspirin EC 325 MG tablet Take 325 mg by mouth as needed (Pain).    [provider]  aspirin EC 81 MG tablet Take 81 mg by mouth daily. Swallow whole.    [provider]  carbidopa-levodopa (SINEMET IR) 25-100 MG tablet Take 1 tablet by mouth 3 (three) times daily.    [provider]  chlorhexidine  (PERIDEX ) 0.12 % solution Use as directed 5 mLs in the mouth or throat 2 (two) times daily. FLOSS AND BRUSH TEETH. SWISH 1 CAPFUL IN MOUTH THEN EXPECTORATE. DO NOT EAT/DRINK 2-3 HOURS AFTER 03/11/24   [provider]  Cholecalciferol  (VITAMIN D3 PO) Take 5,000 Int'l Units by mouth daily.    [provider]  Cyanocobalamin  (VITAMIN B 12 PO) Take 5,000 mcg by mouth daily.    [provider]  Cyanocobalamin  1000 MCG/ML KIT Inject into the muscle. Every three months 06/04/22   [provider]  diazepam  (VALIUM ) 2 MG tablet Take 2 mg by mouth every 12 (twelve) hours as needed for anxiety or muscle spasms. 02/25/24   [provider]  DM-APAP-CPM (CORICIDIN HBP PO) Take by mouth as needed.    [provider]  Docusate Sodium  (COLACE PO) Take by mouth daily. Daily    [provider]  erythromycin ophthalmic ointment Place a 1/2 inch ribbon of ointment into the lower eyelid. Use 3 times a day for 7 days. 06/17/24   Thoams Siefert, DO  hydrochlorothiazide  (MICROZIDE ) 12.5 MG capsule Take 1 capsule (12.5 mg total) by mouth daily. 12/11/23 04/22/24  End, Lonni, MD  LORazepam  (ATIVAN ) 0.5 MG tablet Take 1 tablet (0.5 mg total) by mouth 2 (two) times daily as needed for anxiety. 10/30/23   Melvin Pao, NP  mirtazapine  (REMERON  SOL-TAB) 30 MG disintegrating tablet Take 1 tablet (30 mg total) by mouth at bedtime. 04/23/24   Melvin Pao, NP   omeprazole (PRILOSEC) 20 MG capsule Take 20 mg by mouth daily.    [provider]  pyridOXINE (B-6) 50 MG tablet Take 50 mg by mouth daily.    [provider]  rizatriptan  (MAXALT ) 10 MG tablet Take 1 tablet (10 mg total) by mouth as needed for migraine. May repeat in 2 hours if needed 08/02/21   Melvin Pao, NP    Family History Family History  Problem Relation Age of Onset   Breast cancer Mother    Thyroid  cancer Mother    Lymphoma Father    CVA Father    Heart disease Sister        Dysautonomia   Hypertension Sister    Heart attack Maternal Grandfather     Social History Social History   Tobacco Use   Smoking status: Never   Smokeless tobacco: Never  Vaping Use   Vaping status: Never Used  Substance Use  Topics   Alcohol use: Not Currently    Comment: 1 beer per month   Drug use: No     Allergies   Lisinopril  and Propranolol    Review of Systems Review of Systems : negative unless otherwise stated in HPI.      Physical Exam Triage Vital Signs ED Triage Vitals [06/19/24 1850]  Encounter Vitals Group     BP (!) 170/103     Girls Systolic BP Percentile      Girls Diastolic BP Percentile      Boys Systolic BP Percentile      Boys Diastolic BP Percentile      Pulse Rate (!) 102     Resp 18     Temp 98.9 F (37.2 C)     Temp Source Oral     SpO2 96 %     Weight      Height      Head Circumference      Peak Flow      Pain Score 2     Pain Loc      Pain Education      Exclude from Growth Chart    No data found.  Updated Vital Signs BP (!) 170/103 (BP Location: Left Arm)   Pulse (!) 102   Temp 98.9 F (37.2 C) (Oral)   Resp 18   SpO2 96%   Visual Acuity Right Eye Distance:   Left Eye Distance:   Bilateral Distance:    Right Eye Near:   Left Eye Near:    Bilateral Near:     Physical Exam  GEN: pleasant male in no acute distress  CV: regular rate  RESP: no increased work of breathing EYES:     General: Left  upper lid is edematous and erythematous. Wearing glasses. Vision grossly intact. Gaze aligned appropriately.        Right eye: No foreign body, discharge or hordeolum.        Left eye: No foreign body or discharge. External upper hordeolum.     Extraocular Movements: Extraocular movements intact.     PERRLA     Conjunctiva/sclera:     Left eye: Left conjunctiva is not injected. No chemosis or hemorrhage.    Comments: fluorescein stain deferred  MSK:  crutches present  SKIN: warm and dry   UC Treatments / Results  Labs (all labs ordered are listed, but only abnormal results are displayed) Labs Reviewed - No data to display  EKG   Radiology No results found.  Procedures Procedures (including critical care time)  Medications Ordered in UC Medications - No data to display  Initial Impression / Assessment and Plan / UC Course  I have reviewed the triage vital signs and the nursing notes.  Pertinent labs & imaging results that were available during my care of the patient were reviewed by me and considered in my medical decision making (see chart for details).     Hordeolum  Patient is a 64 y.o. male who presents after left eyelid swelling with redness for the past 3 days.  On exam, he has evidence of external hordeolum on the left. Treat with Augmentin BID for 7 days. Use erythromycin ointment at bedtime. Apply some hydrocortisone cream 1% to upper left lid. Continue warm compresses.  Advised to follow-up with an ophthalmologist, if  discomfort/pain is not improving after 7day course. Understanding voiced.   Discussed MDM, treatment plan and plan for follow-up with patient who agrees with plan.  Final Clinical Impressions(s) / UC Diagnoses   Final diagnoses:  Hordeolum externum of left upper eyelid     Discharge Instructions      Stop by the pharmacy to pick up your antibiotics.  Use the erythromycin ointment at bedtime. Apply some hydrocortisone 1% cream, which can be  purchased over the counter, to your lower lid.  Follow up with your primary eyecare provider or [facility],  if symptoms suddenly worsen or you have little improvement in your eye symptoms.      ED Prescriptions     Medication Sig Dispense Auth. Provider   amoxicillin-clavulanate (AUGMENTIN) 875-125 MG tablet Take 1 tablet by mouth every 12 (twelve) hours. 14 tablet Abia Monaco, DO   hydrocortisone cream 1 % Apply to affected area 2 times daily 15 g Jaykwon Morones, DO      PDMP not reviewed this encounter.       Aerilynn Goin, DO 06/19/24 1943

## 2024-06-19 NOTE — Discharge Instructions (Addendum)
 Stop by the pharmacy to pick up your antibiotics.  Use the erythromycin ointment at bedtime. Apply some hydrocortisone 1% cream, which can be purchased over the counter, to your lower lid.  Follow up with your primary eyecare provider or [facility],  if symptoms suddenly worsen or you have little improvement in your eye symptoms.

## 2024-06-23 ENCOUNTER — Encounter: Admitting: Orthopedic Surgery

## 2024-06-25 ENCOUNTER — Encounter: Admitting: Occupational Therapy

## 2024-06-25 ENCOUNTER — Ambulatory Visit

## 2024-06-25 DIAGNOSIS — R4681 Obsessive-compulsive behavior: Secondary | ICD-10-CM | POA: Diagnosis not present

## 2024-06-25 DIAGNOSIS — F0670 Mild neurocognitive disorder due to known physiological condition without behavioral disturbance: Secondary | ICD-10-CM | POA: Diagnosis not present

## 2024-06-25 DIAGNOSIS — F840 Autistic disorder: Secondary | ICD-10-CM | POA: Diagnosis not present

## 2024-06-29 ENCOUNTER — Encounter: Payer: Self-pay | Admitting: Nurse Practitioner

## 2024-06-29 ENCOUNTER — Ambulatory Visit: Admitting: Nurse Practitioner

## 2024-06-29 VITALS — BP 152/88 | HR 121 | Temp 98.7°F | Ht 67.0 in | Wt 139.2 lb

## 2024-06-29 DIAGNOSIS — R7303 Prediabetes: Secondary | ICD-10-CM

## 2024-06-29 DIAGNOSIS — G250 Essential tremor: Secondary | ICD-10-CM | POA: Diagnosis not present

## 2024-06-29 DIAGNOSIS — E538 Deficiency of other specified B group vitamins: Secondary | ICD-10-CM | POA: Diagnosis not present

## 2024-06-29 DIAGNOSIS — Z23 Encounter for immunization: Secondary | ICD-10-CM

## 2024-06-29 DIAGNOSIS — E7800 Pure hypercholesterolemia, unspecified: Secondary | ICD-10-CM | POA: Diagnosis not present

## 2024-06-29 DIAGNOSIS — I10 Essential (primary) hypertension: Secondary | ICD-10-CM | POA: Diagnosis not present

## 2024-06-29 DIAGNOSIS — R634 Abnormal weight loss: Secondary | ICD-10-CM | POA: Diagnosis not present

## 2024-06-29 NOTE — Assessment & Plan Note (Signed)
 Chronic.  Patient did gained 2lbs since last visit.  Encouraged him to continue with weight gain.

## 2024-06-29 NOTE — Assessment & Plan Note (Signed)
 Chronic.  Controlled.  Continue with current medication regimen.  Labs ordered today.  Return to clinic in 6 months for reevaluation.  Call sooner if concerns arise.  ? ?

## 2024-06-29 NOTE — Assessment & Plan Note (Signed)
 Labs ordered at visit today.  Will make recommendations based on lab results.

## 2024-06-29 NOTE — Progress Notes (Signed)
 BP (!) 152/88 (BP Location: Right Arm, Cuff Size: Normal)   Pulse (!) 121   Temp 98.7 F (37.1 C) (Oral)   Ht 5' 7 (1.702 m)   Wt 139 lb 3.2 oz (63.1 kg)   SpO2 96%   BMI 21.80 kg/m    Subjective:    Patient ID: [name], male    DOB: [birth year], 63 y.o.   MRN: [id number]  HPI: [name] is a 63 y.o. male  Chief Complaint  Patient presents with   Weight Check   Hypertension   HYPERTENSION with Chronic Kidney Disease Dr. Marcelino changed HCTZ to Amlodipine .  He is taking his blood pressure medication.  He has seen Cardiology who has already approved.  Hypertension status: controlled Satisfied with current treatment? no Duration of hypertension: years BP monitoring frequency:  not checking BP range:  BP medication side effects:  no Medication compliance: excellent compliance Previous BP meds:none Aspirin: no Recurrent headaches: no Visual changes: no Palpitations: no Dyspnea: no Chest pain: no Lower extremity edema: no Dizzy/lightheaded: no   Patient has gained 2lbs since last visit.  He is eating lots of brownies, bacon and garlic knots.  He has not been eating any protein.    Patient will have surgery on 12/10 with Neurosurgery.  The cervical stenosis is worsening likely causing the atrophy in his legs.     Relevant past medical, surgical, family and social history reviewed and updated as indicated. Interim medical history since our last visit reviewed. Allergies and medications reviewed and updated.  Review of Systems  Constitutional:  Negative for unexpected weight change.  Eyes:  Negative for visual disturbance.  Respiratory:  Negative for shortness of breath.   Cardiovascular:  Negative for chest pain and leg swelling.  Neurological:  Negative for light-headedness and headaches.  Psychiatric/Behavioral:  The patient is nervous/anxious.     Per HPI unless specifically indicated above     Objective:    BP (!) 152/88 (BP Location:  Right Arm, Cuff Size: Normal)   Pulse (!) 121   Temp 98.7 F (37.1 C) (Oral)   Ht 5' 7 (1.702 m)   Wt 139 lb 3.2 oz (63.1 kg)   SpO2 96%   BMI 21.80 kg/m   Wt Readings from Last 3 Encounters:  06/29/24 139 lb 3.2 oz (63.1 kg)  06/17/24 137 lb (62.1 kg)  04/22/24 135 lb 6.4 oz (61.4 kg)    Physical Exam Vitals and nursing note reviewed.  Constitutional:      General: He is not in acute distress.    Appearance: Normal appearance. He is not ill-appearing, toxic-appearing or diaphoretic.  HENT:     Head: Normocephalic.     Right Ear: External ear normal.     Left Ear: External ear normal.     Nose: Nose normal. No congestion or rhinorrhea.     Mouth/Throat:     Mouth: Mucous membranes are moist.  Eyes:     General:        Right eye: No discharge.        Left eye: No discharge.     Extraocular Movements: Extraocular movements intact.     Conjunctiva/sclera: Conjunctivae normal.     Pupils: Pupils are equal, round, and reactive to light.  Cardiovascular:     Rate and Rhythm: Normal rate and regular rhythm.     Heart sounds: No murmur heard. Pulmonary:     Effort: Pulmonary effort is normal. No respiratory distress.  Breath sounds: Normal breath sounds. No wheezing, rhonchi or rales.  Abdominal:     General: Abdomen is flat. Bowel sounds are normal.  Musculoskeletal:     Cervical back: Normal range of motion and neck supple.  Skin:    General: Skin is warm and dry.     Capillary Refill: Capillary refill takes less than 2 seconds.  Neurological:     General: No focal deficit present.     Mental Status: He is alert and oriented to person, place, and time.     Comments: tremor  Psychiatric:        Mood and Affect: Mood normal.        Behavior: Behavior normal.        Thought Content: Thought content normal.        Judgment: Judgment normal.     Results for orders placed or performed in visit on 04/22/24  Comprehensive metabolic panel with GFR   Collection Time:  04/22/24  2:44 PM  Result Value Ref Range   Glucose 114 (H) 70 - 99 mg/dL   BUN 15 8 - 27 mg/dL   Creatinine, Ser 9.08 0.76 - 1.27 mg/dL   eGFR 95 >40 fO/fpw/8.26   BUN/Creatinine Ratio 16 10 - 24   Sodium 137 134 - 144 mmol/L   Potassium 4.2 3.5 - 5.2 mmol/L   Chloride 100 96 - 106 mmol/L   CO2 23 20 - 29 mmol/L   Calcium 9.3 8.6 - 10.2 mg/dL   Total Protein 6.3 6.0 - 8.5 g/dL   Albumin 4.0 3.9 - 4.9 g/dL   Globulin, Total 2.3 1.5 - 4.5 g/dL   Bilirubin Total 0.7 0.0 - 1.2 mg/dL   Alkaline Phosphatase 73 44 - 121 IU/L   AST 18 0 - 40 IU/L   ALT 14 0 - 44 IU/L  Hemoglobin A1c   Collection Time: 04/22/24  2:44 PM  Result Value Ref Range   Hgb A1c MFr Bld 5.7 (H) 4.8 - 5.6 %   Est. average glucose Bld gHb Est-mCnc 117 mg/dL  Lipid panel   Collection Time: 04/22/24  2:44 PM  Result Value Ref Range   Cholesterol, Total 181 100 - 199 mg/dL   Triglycerides 836 (H) 0 - 149 mg/dL   HDL 69 >60 mg/dL   VLDL Cholesterol Cal 27 5 - 40 mg/dL   LDL Chol Calc (NIH) 85 0 - 99 mg/dL   Chol/HDL Ratio 2.6 0.0 - 5.0 ratio  B12   Collection Time: 04/22/24  2:44 PM  Result Value Ref Range   Vitamin B-12 1,755 (H) 232 - 1,245 pg/mL  CBC With Diff/Platelet   Collection Time: 04/22/24  2:44 PM  Result Value Ref Range   WBC 12.1 (H) 3.4 - 10.8 x10E3/uL   RBC 4.67 4.14 - 5.80 x10E6/uL   Hemoglobin 14.8 13.0 - 17.7 g/dL   Hematocrit 56.6 62.4 - 51.0 %   MCV 93 79 - 97 fL   MCH 31.7 26.6 - 33.0 pg   MCHC 34.2 31.5 - 35.7 g/dL   RDW 87.8 88.3 - 84.5 %   Platelets 295 150 - 450 x10E3/uL   Neutrophils 78 Not Estab. %   Lymphs 16 Not Estab. %   Monocytes 6 Not Estab. %   Eos 0 Not Estab. %   Basos 0 Not Estab. %   Neutrophils Absolute 9.4 (H) 1.4 - 7.0 x10E3/uL   Lymphocytes Absolute 2.0 0.7 - 3.1 x10E3/uL   Monocytes Absolute 0.7 0.1 -  0.9 x10E3/uL   EOS (ABSOLUTE) 0.0 0.0 - 0.4 x10E3/uL   Basophils Absolute 0.0 0.0 - 0.2 x10E3/uL   Immature Granulocytes 0 Not Estab. %   Immature Grans  (Abs) 0.0 0.0 - 0.1 x10E3/uL      Assessment & Plan:   Problem List Items Addressed This Visit       Cardiovascular and Mediastinum   Essential hypertension - Primary   Chronic.  Controlled.  Continue with current medication regimen of Amlodipine .  Labs ordered today.  Return to clinic in 3 months for reevaluation.  Call sooner if concerns arise.        Relevant Medications   propranolol  (INDERAL ) 10 MG tablet   Other Visit Diagnoses       Anxiety       Will start Propranolol . Discussed with patient about low HR. Typical HR is 60-80s for patient. Encouraged him to increase calorie intake. Has an appointment upcoming with Cardiology. Can consider decreasing dose of Amlodipine  if BP is soft.  Follow up in 2 months.         Follow up plan: Return in about 2 weeks (around 07/13/2024) for BP Check.

## 2024-06-29 NOTE — Assessment & Plan Note (Signed)
 Chronic. Not well controlled. Will restart Olmesartan . Continue with HCTZ and Amlodipine .  Labs ordered today.  Follow up in two weeks.  Patient needs to be cleared for surgery for December 10.  Blood pressure will need to be better controlled to be cleared for surgery.

## 2024-06-30 MED ORDER — OLMESARTAN MEDOXOMIL 20 MG PO TABS: 20.0000 mg | ORAL_TABLET | Freq: Every day | ORAL | 0 refills | Status: DC

## 2024-07-01 ENCOUNTER — Other Ambulatory Visit

## 2024-07-01 DIAGNOSIS — R7303 Prediabetes: Secondary | ICD-10-CM | POA: Diagnosis not present

## 2024-07-01 DIAGNOSIS — I10 Essential (primary) hypertension: Secondary | ICD-10-CM | POA: Diagnosis not present

## 2024-07-01 DIAGNOSIS — E538 Deficiency of other specified B group vitamins: Secondary | ICD-10-CM | POA: Diagnosis not present

## 2024-07-01 DIAGNOSIS — E7800 Pure hypercholesterolemia, unspecified: Secondary | ICD-10-CM | POA: Diagnosis not present

## 2024-07-02 ENCOUNTER — Ambulatory Visit

## 2024-07-02 ENCOUNTER — Ambulatory Visit: Payer: Self-pay | Admitting: Nurse Practitioner

## 2024-07-02 ENCOUNTER — Encounter: Admitting: Occupational Therapy

## 2024-07-02 DIAGNOSIS — R4681 Obsessive-compulsive behavior: Secondary | ICD-10-CM | POA: Diagnosis not present

## 2024-07-02 DIAGNOSIS — F840 Autistic disorder: Secondary | ICD-10-CM | POA: Diagnosis not present

## 2024-07-02 DIAGNOSIS — F0670 Mild neurocognitive disorder due to known physiological condition without behavioral disturbance: Secondary | ICD-10-CM | POA: Diagnosis not present

## 2024-07-02 LAB — CBC WITH DIFF/PLATELET
Basophils Absolute: 0.1 x10E3/uL (ref 0.0–0.2)
Basos: 1 %
EOS (ABSOLUTE): 0.1 x10E3/uL (ref 0.0–0.4)
Eos: 1 %
Hematocrit: 44.8 % (ref 37.5–51.0)
Hemoglobin: 15.2 g/dL (ref 13.0–17.7)
Immature Grans (Abs): 0 x10E3/uL (ref 0.0–0.1)
Immature Granulocytes: 0 %
Lymphocytes Absolute: 1.8 x10E3/uL (ref 0.7–3.1)
Lymphs: 25 %
MCH: 31.9 pg (ref 26.6–33.0)
MCHC: 33.9 g/dL (ref 31.5–35.7)
MCV: 94 fL (ref 79–97)
Monocytes Absolute: 0.5 x10E3/uL (ref 0.1–0.9)
Monocytes: 7 %
Neutrophils Absolute: 4.8 x10E3/uL (ref 1.4–7.0)
Neutrophils: 66 %
Platelets: 263 x10E3/uL (ref 150–450)
RBC: 4.77 x10E6/uL (ref 4.14–5.80)
RDW: 12.4 % (ref 11.6–15.4)
WBC: 7.3 x10E3/uL (ref 3.4–10.8)

## 2024-07-02 LAB — COMPREHENSIVE METABOLIC PANEL WITH GFR
ALT: 28 IU/L (ref 0–44)
AST: 21 IU/L (ref 0–40)
Albumin: 4.3 g/dL (ref 3.9–4.9)
Alkaline Phosphatase: 88 IU/L (ref 47–123)
BUN/Creatinine Ratio: 16 (ref 10–24)
BUN: 12 mg/dL (ref 8–27)
Bilirubin Total: 0.7 mg/dL (ref 0.0–1.2)
CO2: 23 mmol/L (ref 20–29)
Calcium: 9.3 mg/dL (ref 8.6–10.2)
Chloride: 100 mmol/L (ref 96–106)
Creatinine, Ser: 0.74 mg/dL — ABNORMAL LOW (ref 0.76–1.27)
Globulin, Total: 2.1 g/dL (ref 1.5–4.5)
Glucose: 105 mg/dL — ABNORMAL HIGH (ref 70–99)
Potassium: 4.4 mmol/L (ref 3.5–5.2)
Sodium: 136 mmol/L (ref 134–144)
Total Protein: 6.4 g/dL (ref 6.0–8.5)
eGFR: 101 mL/min/1.73 (ref >59)

## 2024-07-02 LAB — LIPID PANEL
Chol/HDL Ratio: 2.7 ratio (ref 0.0–5.0)
Cholesterol, Total: 218 mg/dL — ABNORMAL HIGH (ref 100–199)
HDL: 80 mg/dL (ref >39)
LDL Chol Calc (NIH): 103 mg/dL — ABNORMAL HIGH (ref 0–99)
Triglycerides: 210 mg/dL — ABNORMAL HIGH (ref 0–149)
VLDL Cholesterol Cal: 35 mg/dL (ref 5–40)

## 2024-07-02 LAB — VITAMIN B12: Vitamin B-12: 1475 pg/mL — ABNORMAL HIGH (ref 232–1245)

## 2024-07-02 LAB — HEMOGLOBIN A1C
Est. average glucose Bld gHb Est-mCnc: 108 mg/dL
Hgb A1c MFr Bld: 5.4 % (ref 4.8–5.6)

## 2024-07-09 ENCOUNTER — Ambulatory Visit

## 2024-07-09 ENCOUNTER — Encounter: Admitting: Occupational Therapy

## 2024-07-13 ENCOUNTER — Encounter: Payer: Self-pay | Admitting: Nurse Practitioner

## 2024-07-13 ENCOUNTER — Ambulatory Visit (INDEPENDENT_AMBULATORY_CARE_PROVIDER_SITE_OTHER): Admitting: Nurse Practitioner

## 2024-07-13 VITALS — BP 118/84 | HR 101 | Temp 99.0°F | Ht 67.0 in | Wt 137.8 lb

## 2024-07-13 DIAGNOSIS — R053 Chronic cough: Secondary | ICD-10-CM | POA: Diagnosis not present

## 2024-07-13 DIAGNOSIS — I10 Essential (primary) hypertension: Secondary | ICD-10-CM

## 2024-07-13 MED ORDER — OMEPRAZOLE 40 MG PO CPDR: 40.0000 mg | DELAYED_RELEASE_CAPSULE | Freq: Every day | ORAL | 1 refills | Status: AC

## 2024-07-13 NOTE — Progress Notes (Signed)
 BP 118/84   Pulse (!) 101   Temp 99 F (37.2 C) (Oral)   Ht 5' 7 (1.702 m)   Wt 137 lb 12.8 oz (62.5 kg)   SpO2 96%   BMI 21.58 kg/m    Subjective:    Patient ID: [name], male    DOB: [birth year], 63 y.o.   MRN: [id number]  HPI: [name] is a 63 y.o. male  Chief Complaint  Patient presents with   Hypertension   HYPERTENSION with Chronic Kidney Disease Patient is currently taking Amlodipine , HCTZ and Olmesartan . Well controlled.  Hypertension status: controlled Satisfied with current treatment? no Duration of hypertension: years BP monitoring frequency:  not checking BP range:  BP medication side effects:  no Medication compliance: excellent compliance Previous BP meds:none Aspirin: no Recurrent headaches: no Visual changes: no Palpitations: no Dyspnea: no Chest pain: no Lower extremity edema: no Dizzy/lightheaded: no  Patient is having a cough.  Started when he was on Lisinopril .  Improved with stopping. However, cough came back prior to him having COVID.  Is worse at night when he lays down.  Feels like he has a lot of saliva and phlegm.  Has not worsened with starting Olmesartan .   Relevant past medical, surgical, family and social history reviewed and updated as indicated. Interim medical history since our last visit reviewed. Allergies and medications reviewed and updated.  Review of Systems  Eyes:  Negative for visual disturbance.  Respiratory:  Positive for cough. Negative for shortness of breath.   Cardiovascular:  Negative for chest pain and leg swelling.  Neurological:  Negative for light-headedness and headaches.    Per HPI unless specifically indicated above     Objective:    BP 118/84   Pulse (!) 101   Temp 99 F (37.2 C) (Oral)   Ht 5' 7 (1.702 m)   Wt 137 lb 12.8 oz (62.5 kg)   SpO2 96%   BMI 21.58 kg/m   Wt Readings from Last 3 Encounters:  07/13/24 137 lb 12.8 oz (62.5 kg)  06/29/24 139 lb 3.2 oz (63.1 kg)   06/17/24 137 lb (62.1 kg)    Physical Exam Vitals and nursing note reviewed.  Constitutional:      General: He is not in acute distress.    Appearance: Normal appearance. He is not ill-appearing, toxic-appearing or diaphoretic.  HENT:     Head: Normocephalic.     Right Ear: External ear normal.     Left Ear: External ear normal.     Nose: Nose normal. No congestion or rhinorrhea.     Mouth/Throat:     Mouth: Mucous membranes are moist.  Eyes:     General:        Right eye: No discharge.        Left eye: No discharge.     Extraocular Movements: Extraocular movements intact.     Conjunctiva/sclera: Conjunctivae normal.     Pupils: Pupils are equal, round, and reactive to light.  Cardiovascular:     Rate and Rhythm: Normal rate and regular rhythm.     Heart sounds: No murmur heard. Pulmonary:     Effort: Pulmonary effort is normal. No respiratory distress.     Breath sounds: Normal breath sounds. No wheezing, rhonchi or rales.  Abdominal:     General: Abdomen is flat. Bowel sounds are normal.  Musculoskeletal:     Cervical back: Normal range of motion and neck supple.  Skin:    General:  Skin is warm and dry.     Capillary Refill: Capillary refill takes less than 2 seconds.  Neurological:     General: No focal deficit present.     Mental Status: He is alert and oriented to person, place, and time.  Psychiatric:        Mood and Affect: Mood normal.        Behavior: Behavior normal.        Thought Content: Thought content normal.        Judgment: Judgment normal.     Results for orders placed or performed in visit on 06/29/24  Comprehensive metabolic panel with GFR   Collection Time: 07/01/24  2:42 PM  Result Value Ref Range   Glucose 105 (H) 70 - 99 mg/dL   BUN 12 8 - 27 mg/dL   Creatinine, Ser 9.25 (L) 0.76 - 1.27 mg/dL   eGFR 898 >40 fO/fpw/8.26   BUN/Creatinine Ratio 16 10 - 24   Sodium 136 134 - 144 mmol/L   Potassium 4.4 3.5 - 5.2 mmol/L   Chloride 100 96 -  106 mmol/L   CO2 23 20 - 29 mmol/L   Calcium 9.3 8.6 - 10.2 mg/dL   Total Protein 6.4 6.0 - 8.5 g/dL   Albumin 4.3 3.9 - 4.9 g/dL   Globulin, Total 2.1 1.5 - 4.5 g/dL   Bilirubin Total 0.7 0.0 - 1.2 mg/dL   Alkaline Phosphatase 88 47 - 123 IU/L   AST 21 0 - 40 IU/L   ALT 28 0 - 44 IU/L  Hemoglobin A1c   Collection Time: 07/01/24  2:42 PM  Result Value Ref Range   Hgb A1c MFr Bld 5.4 4.8 - 5.6 %   Est. average glucose Bld gHb Est-mCnc 108 mg/dL  Lipid panel   Collection Time: 07/01/24  2:42 PM  Result Value Ref Range   Cholesterol, Total 218 (H) 100 - 199 mg/dL   Triglycerides 789 (H) 0 - 149 mg/dL   HDL 80 >60 mg/dL   VLDL Cholesterol Cal 35 5 - 40 mg/dL   LDL Chol Calc (NIH) 896 (H) 0 - 99 mg/dL   Chol/HDL Ratio 2.7 0.0 - 5.0 ratio  CBC With Diff/Platelet   Collection Time: 07/01/24  2:42 PM  Result Value Ref Range   WBC 7.3 3.4 - 10.8 x10E3/uL   RBC 4.77 4.14 - 5.80 x10E6/uL   Hemoglobin 15.2 13.0 - 17.7 g/dL   Hematocrit 55.1 62.4 - 51.0 %   MCV 94 79 - 97 fL   MCH 31.9 26.6 - 33.0 pg   MCHC 33.9 31.5 - 35.7 g/dL   RDW 87.5 88.3 - 84.5 %   Platelets 263 150 - 450 x10E3/uL   Neutrophils 66 Not Estab. %   Lymphs 25 Not Estab. %   Monocytes 7 Not Estab. %   Eos 1 Not Estab. %   Basos 1 Not Estab. %   Neutrophils Absolute 4.8 1.4 - 7.0 x10E3/uL   Lymphocytes Absolute 1.8 0.7 - 3.1 x10E3/uL   Monocytes Absolute 0.5 0.1 - 0.9 x10E3/uL   EOS (ABSOLUTE) 0.1 0.0 - 0.4 x10E3/uL   Basophils Absolute 0.1 0.0 - 0.2 x10E3/uL   Immature Granulocytes 0 Not Estab. %   Immature Grans (Abs) 0.0 0.0 - 0.1 x10E3/uL  B12   Collection Time: 07/01/24  2:42 PM  Result Value Ref Range   Vitamin B-12 1,475 (H) 232 - 1,245 pg/mL      Assessment & Plan:   Problem List Items  Addressed This Visit       Cardiovascular and Mediastinum   Essential hypertension - Primary   Chronic.  Controlled.  Continue with current medication regimen of Olmesartan , HCTZ and Amlodipine .   Return to  clinic in 6 months for reevaluation.  Call sooner if concerns arise.        Other Visit Diagnoses       Chronic cough       GERD vs post nasal drip. Will start Allegra 1 hour before bed. Will increase Omeprazole  from 20mg  to 40mg .  Follow up in 6 weeks.  Call sooner if concerns arise        Follow up plan: Return in about 6 weeks (around 08/24/2024) for BP Check and Cough.

## 2024-07-13 NOTE — Progress Notes (Deleted)
 There were no vitals taken for this visit.   Subjective:    Patient ID: [name], male    DOB: [birth year], 63 y.o.   MRN: [id number]  HPI: [name] is a 63 y.o. male  No chief complaint on file.  HYPERTENSION with Chronic [facility] Disease Dr. Marcelino changed HCTZ to Amlodipine .  He is taking his blood pressure medication.  He has seen Cardiology who has already approved.  Hypertension status: controlled Satisfied with current treatment? no Duration of hypertension: years BP monitoring frequency:  not checking BP range:  BP medication side effects:  no Medication compliance: excellent compliance Previous BP meds:none Aspirin: no Recurrent headaches: no Visual changes: no Palpitations: no Dyspnea: no Chest pain: no Lower extremity edema: no Dizzy/lightheaded: no     Relevant past medical, surgical, family and social history reviewed and updated as indicated. Interim medical history since our last visit reviewed. Allergies and medications reviewed and updated.  Review of Systems  Constitutional:  Negative for unexpected weight change.  Eyes:  Negative for visual disturbance.  Respiratory:  Negative for shortness of breath.   Cardiovascular:  Negative for chest pain and leg swelling.  Neurological:  Negative for light-headedness and headaches.  Psychiatric/Behavioral:  The patient is nervous/anxious.     Per HPI unless specifically indicated above     Objective:    There were no vitals taken for this visit.  Wt Readings from Last 3 Encounters:  06/29/24 139 lb 3.2 oz (63.1 kg)  06/17/24 137 lb (62.1 kg)  04/22/24 135 lb 6.4 oz (61.4 kg)    Physical Exam Vitals and nursing note reviewed.  Constitutional:      General: He is not in acute distress.    Appearance: Normal appearance. He is not ill-appearing, toxic-appearing or diaphoretic.  HENT:     Head: Normocephalic.     Right Ear: External ear normal.     Left Ear: External ear normal.      Nose: Nose normal. No congestion or rhinorrhea.     Mouth/Throat:     Mouth: Mucous membranes are moist.  Eyes:     General:        Right eye: No discharge.        Left eye: No discharge.     Extraocular Movements: Extraocular movements intact.     Conjunctiva/sclera: Conjunctivae normal.     Pupils: Pupils are equal, round, and reactive to light.  Cardiovascular:     Rate and Rhythm: Normal rate and regular rhythm.     Heart sounds: No murmur heard. Pulmonary:     Effort: Pulmonary effort is normal. No respiratory distress.     Breath sounds: Normal breath sounds. No wheezing, rhonchi or rales.  Abdominal:     General: Abdomen is flat. Bowel sounds are normal.  Musculoskeletal:     Cervical back: Normal range of motion and neck supple.  Skin:    General: Skin is warm and dry.     Capillary Refill: Capillary refill takes less than 2 seconds.  Neurological:     General: No focal deficit present.     Mental Status: He is alert and oriented to person, place, and time.     Comments: tremor  Psychiatric:        Mood and Affect: Mood normal.        Behavior: Behavior normal.        Thought Content: Thought content normal.        Judgment: Judgment  normal.     Results for orders placed or performed in visit on 06/29/24  Comprehensive metabolic panel with GFR   Collection Time: 07/01/24  2:42 PM  Result Value Ref Range   Glucose 105 (H) 70 - 99 mg/dL   BUN 12 8 - 27 mg/dL   Creatinine, Ser 9.25 (L) 0.76 - 1.27 mg/dL   eGFR 898 >40 fO/fpw/8.26   BUN/Creatinine Ratio 16 10 - 24   Sodium 136 134 - 144 mmol/L   Potassium 4.4 3.5 - 5.2 mmol/L   Chloride 100 96 - 106 mmol/L   CO2 23 20 - 29 mmol/L   Calcium 9.3 8.6 - 10.2 mg/dL   Total Protein 6.4 6.0 - 8.5 g/dL   Albumin 4.3 3.9 - 4.9 g/dL   Globulin, Total 2.1 1.5 - 4.5 g/dL   Bilirubin Total 0.7 0.0 - 1.2 mg/dL   Alkaline Phosphatase 88 47 - 123 IU/L   AST 21 0 - 40 IU/L   ALT 28 0 - 44 IU/L  Hemoglobin A1c   Collection  Time: 07/01/24  2:42 PM  Result Value Ref Range   Hgb A1c MFr Bld 5.4 4.8 - 5.6 %   Est. average glucose Bld gHb Est-mCnc 108 mg/dL  Lipid panel   Collection Time: 07/01/24  2:42 PM  Result Value Ref Range   Cholesterol, Total 218 (H) 100 - 199 mg/dL   Triglycerides 789 (H) 0 - 149 mg/dL   HDL 80 >60 mg/dL   VLDL Cholesterol Cal 35 5 - 40 mg/dL   LDL Chol Calc (NIH) 896 (H) 0 - 99 mg/dL   Chol/HDL Ratio 2.7 0.0 - 5.0 ratio  CBC With Diff/Platelet   Collection Time: 07/01/24  2:42 PM  Result Value Ref Range   WBC 7.3 3.4 - 10.8 x10E3/uL   RBC 4.77 4.14 - 5.80 x10E6/uL   Hemoglobin 15.2 13.0 - 17.7 g/dL   Hematocrit 55.1 62.4 - 51.0 %   MCV 94 79 - 97 fL   MCH 31.9 26.6 - 33.0 pg   MCHC 33.9 31.5 - 35.7 g/dL   RDW 87.5 88.3 - 84.5 %   Platelets 263 150 - 450 x10E3/uL   Neutrophils 66 Not Estab. %   Lymphs 25 Not Estab. %   Monocytes 7 Not Estab. %   Eos 1 Not Estab. %   Basos 1 Not Estab. %   Neutrophils Absolute 4.8 1.4 - 7.0 x10E3/uL   Lymphocytes Absolute 1.8 0.7 - 3.1 x10E3/uL   Monocytes Absolute 0.5 0.1 - 0.9 x10E3/uL   EOS (ABSOLUTE) 0.1 0.0 - 0.4 x10E3/uL   Basophils Absolute 0.1 0.0 - 0.2 x10E3/uL   Immature Granulocytes 0 Not Estab. %   Immature Grans (Abs) 0.0 0.0 - 0.1 x10E3/uL  B12   Collection Time: 07/01/24  2:42 PM  Result Value Ref Range   Vitamin B-12 1,475 (H) 232 - 1,245 pg/mL      Assessment & Plan:   Problem List Items Addressed This Visit       Cardiovascular and Mediastinum   Essential hypertension - Primary   Chronic.  Controlled.  Continue with current medication regimen of Amlodipine .  Labs ordered today.  Return to clinic in 3 months for reevaluation.  Call sooner if concerns arise.        Relevant Medications   propranolol  (INDERAL ) 10 MG tablet   Other Visit Diagnoses       Anxiety       Will start Propranolol . Discussed with patient  about low HR. Typical HR is 60-80s for patient. Encouraged him to increase calorie intake. Has an  appointment upcoming with Cardiology. Can consider decreasing dose of Amlodipine  if BP is soft.  Follow up in 2 months.         Follow up plan: No follow-ups on file.

## 2024-07-13 NOTE — Assessment & Plan Note (Signed)
 Chronic.  Controlled.  Continue with current medication regimen of Olmesartan , HCTZ and Amlodipine .   Return to clinic in 6 months for reevaluation.  Call sooner if concerns arise.

## 2024-07-21 ENCOUNTER — Encounter: Admitting: Neurosurgery

## 2024-07-21 ENCOUNTER — Other Ambulatory Visit: Payer: Self-pay

## 2024-07-21 ENCOUNTER — Inpatient Hospital Stay
Admission: RE | Admit: 2024-07-21 | Discharge: 2024-07-21 | Disposition: A | Source: Ambulatory Visit | Attending: Neurosurgery | Admitting: Neurosurgery

## 2024-07-21 DIAGNOSIS — Z01818 Encounter for other preprocedural examination: Secondary | ICD-10-CM | POA: Diagnosis not present

## 2024-07-21 HISTORY — DX: Gastro-esophageal reflux disease without esophagitis: K21.9

## 2024-07-21 HISTORY — DX: Sleep apnea, unspecified: G47.30

## 2024-07-21 NOTE — Patient Instructions (Addendum)
 Your procedure is scheduled on: Wednesday 07/29/24 Report to the Registration Desk on the 1st floor of the [facility]. To find out your arrival time, please call [phone] between 1PM - 3PM on: Tuesday 07/28/24 If your arrival time is 6:00 am, do not arrive before that time as the [facility] entrance doors do not open until 6:00 am.  REMEMBER: Instructions that are not followed completely may result in serious medical risk, up to and including death; or upon the discretion of your surgeon and anesthesiologist your surgery may need to be rescheduled.  Do not eat food or drink any liquids after midnight the night before surgery.  No gum chewing or hard candies.  One week prior to surgery: Stop Anti-inflammatories (NSAIDS) such as Advil , Aleve, Ibuprofen , Motrin , Naproxen, Naprosyn and Aspirin based products such as Excedrin, Goody's Powder, BC Powder.  You may however, continue to take Tylenol  if needed for pain up until the day of surgery.  Stop ANY OVER THE COUNTER supplements and vitamins until after surgery.  **Follow recommendations regarding stopping blood thinners.** You may continue your daily 81 mg Aspirin but must stop taking your as needed 325 mg Aspirin for 7 days before surgery and 14 days after surgery.  Continue taking all of your other prescription medications up until the day of surgery.  ON THE DAY OF SURGERY ONLY TAKE THESE MEDICATIONS WITH SIPS OF WATER :  amLODipine  (NORVASC ) 5 MG tablet  omeprazole  (PRILOSEC) 40 MG capsule  LORazepam  (ATIVAN ) 0.5 MG tablet if needed  No Alcohol for 24 hours before or after surgery.  No Smoking including e-cigarettes for 24 hours before surgery.  No chewable tobacco products for at least 6 hours before surgery.  No nicotine patches on the day of surgery.  Do not use any recreational drugs for at least a week (preferably 2 weeks) before your surgery.  Please be advised that the combination of cocaine and anesthesia may  have negative outcomes, up to and including death. If you test positive for cocaine, your surgery will be cancelled.  On the morning of surgery brush your teeth with toothpaste and water , you may rinse your mouth with mouthwash if you wish. Do not swallow any toothpaste or mouthwash.  Use CHG Soap or wipes as directed on instruction sheet.  Do not shave body hair from the neck down..  No lotions, powders, or perfumes on the day of surgery  Wear comfortable clothing (specific to your surgery type) to the hospital.  Do not wear jewelry, make-up, hairpins, clips or nail polish.  For welded (permanent) jewelry: bracelets, anklets, waist bands, etc.  Please have this removed prior to surgery.  If it is not removed, there is a chance that hospital personnel will need to cut it off on the day of surgery.  Contact lenses, hearing aids and dentures may not be worn into surgery. Bring a case to store glasses, contacts or hearing aids.  Do not bring valuables to the hospital. [facility] is not responsible for any missing/lost belongings or valuables.   Notify your doctor if there is any change in your medical condition (cold, fever, infection).  After surgery, you can help prevent lung complications by doing breathing exercises.  Take deep breaths and cough every 1-2 hours. Your doctor may order a device called an Incentive Spirometer to help you take deep breaths.  When coughing or sneezing, hold a pillow firmly against your incision with both hands. This is called "splinting." Doing this helps protect your  incision. It also decreases belly discomfort.  If you are being admitted to the hospital overnight, leave your suitcase in the car. After surgery it may be brought to your room.  In case of increased patient census, it may be necessary for you, the patient, to continue your postoperative care in the Same Day Surgery department.  Please call the Pre-admissions Testing Dept. at [phone] if you have any questions about these instructions.  Surgery Visitation Policy:  Patients having surgery or a procedure may have two visitors.  Children under the age of 70 must have an adult with them who is not the patient.  Inpatient Visitation:    Visiting hours are 7 a.m. to 8 p.m. Up to four visitors are allowed at one time in a patient room. The visitors may rotate out with other people during the day.  One visitor age 18 or older may stay with the patient overnight and must be in the room by 8 p.m.   Merchandiser, Retail to address health-related social needs:  [email or web address]    Pre-operative 4 CHG Bath Instructions   You can play a key role in reducing the risk of infection after surgery. Your skin needs to be as free of germs as possible. You can reduce the number of germs on your skin by washing with CHG (chlorhexidine  gluconate) soap before surgery. CHG is an antiseptic soap that kills germs and continues to kill germs even after washing.   DO NOT use if you have an allergy to chlorhexidine /CHG or antibacterial soaps. If your skin becomes reddened or irritated, stop using the CHG and notify one of our RNs at [phone].   Please shower with the CHG soap starting 4 days before surgery using the following schedule  Saturday 07/25/24 - Tuesday 07/28/24    Please keep in mind the following:  DO NOT shave, including legs and underarms, starting the day of your first shower.   You may shave your face at any point before/day of surgery.  Place clean sheets on your bed the day you start using CHG soap. Use a clean washcloth (not used since being washed) for each shower. DO NOT sleep with pets once you start using the CHG.   CHG Shower Instructions:  If you choose to wash your hair and private area, wash first with your normal shampoo/soap.  After you use shampoo/soap, rinse your hair and body thoroughly to remove shampoo/soap residue.   Turn the water  OFF and apply about 3 tablespoons (45 ml) of CHG soap to a CLEAN washcloth.  Apply CHG soap ONLY FROM YOUR NECK DOWN TO YOUR TOES (washing for 3-5 minutes)  DO NOT use CHG soap on face, private areas, open wounds, or sores.  Pay special attention to the area where your surgery is being performed.  If you are having back surgery, having someone wash your back for you may be helpful. Wait 2 minutes after CHG soap is applied, then you may rinse off the CHG soap.  Pat dry with a clean towel  Put on clean clothes/pajamas   If you choose to wear lotion, please use ONLY the CHG-compatible lotions on the back of this paper.     Additional instructions for the day of surgery: DO NOT APPLY any lotions, deodorants, cologne, or perfumes.   Put on clean/comfortable clothes.  Brush your teeth.  Ask your nurse before applying any prescription medications to the skin.      CHG Compatible Lotions   Aveeno  Moisturizing lotion  Cetaphil Moisturizing Cream  Cetaphil Moisturizing Lotion  Clairol Herbal Essence Moisturizing Lotion, Dry Skin  Clairol Herbal Essence Moisturizing Lotion, Extra Dry Skin  Clairol Herbal Essence Moisturizing Lotion, Normal Skin  Curel Age Defying Therapeutic Moisturizing Lotion with Alpha Hydroxy  Curel Extreme Care Body Lotion  Curel Soothing Hands Moisturizing Hand Lotion  Curel Therapeutic Moisturizing Cream, Fragrance-Free  Curel Therapeutic Moisturizing Lotion, Fragrance-Free  Curel Therapeutic Moisturizing Lotion, Original Formula  Eucerin Daily Replenishing Lotion  Eucerin Dry Skin Therapy Plus Alpha Hydroxy Crme  Eucerin Dry Skin Therapy Plus Alpha Hydroxy Lotion  Eucerin Original Crme  Eucerin Original Lotion  Eucerin Plus Crme Eucerin Plus Lotion  Eucerin TriLipid Replenishing Lotion  Keri Anti-Bacterial Hand Lotion  Keri Deep Conditioning Original Lotion Dry Skin Formula Softly Scented  Keri Deep Conditioning Original Lotion, Fragrance  Free Sensitive Skin Formula  Keri Lotion Fast Absorbing Fragrance Free Sensitive Skin Formula  Keri Lotion Fast Absorbing Softly Scented Dry Skin Formula  Keri Original Lotion  Keri Skin Renewal Lotion Keri Silky Smooth Lotion  Keri Silky Smooth Sensitive Skin Lotion  Nivea Body Creamy Conditioning Oil  Nivea Body Extra Enriched Lotion  Nivea Body Original Lotion  Nivea Body Sheer Moisturizing Lotion Nivea Crme  Nivea Skin Firming Lotion  NutraDerm 30 Skin Lotion  NutraDerm Skin Lotion  NutraDerm Therapeutic Skin Cream  NutraDerm Therapeutic Skin Lotion  ProShield Protective Hand Cream  Provon moisturizing lotion  How to Use an Incentive Spirometer  An incentive spirometer is a tool that measures how well you are filling your lungs with each breath. Learning to take long, deep breaths using this tool can help you keep your lungs clear and active. This may help to reverse or lessen your chance of developing breathing (pulmonary) problems, especially infection. You may be asked to use a spirometer: After a surgery. If you have a lung problem or a history of smoking. After a long period of time when you have been unable to move or be active. If the spirometer includes an indicator to show the highest number that you have reached, your health care provider or respiratory therapist will help you set a goal. Keep a log of your progress as told by your health care provider. What are the risks? Breathing too quickly may cause dizziness or cause you to pass out. Take your time so you do not get dizzy or light-headed. If you are in pain, you may need to take pain medicine before doing incentive spirometry. It is harder to take a deep breath if you are having pain. How to use your incentive spirometer  Sit up on the edge of your bed or on a chair. Hold the incentive spirometer so that it is in an upright position. Before you use the spirometer, breathe out normally. Place the mouthpiece in  your mouth. Make sure your lips are closed tightly around it. Breathe in slowly and as deeply as you can through your mouth, causing the piston or the ball to rise toward the top of the chamber. Hold your breath for 3-5 seconds, or for as long as possible. If the spirometer includes a coach indicator, use this to guide you in breathing. Slow down your breathing if the indicator goes above the marked areas. Remove the mouthpiece from your mouth and breathe out normally. The piston or ball will return to the bottom of the chamber. Rest for a few seconds, then repeat the steps 10 or more times. Take your time and take  a few normal breaths between deep breaths so that you do not get dizzy or light-headed. Do this every 1-2 hours when you are awake. If the spirometer includes a goal marker to show the highest number you have reached (best effort), use this as a goal to work toward during each repetition. After each set of 10 deep breaths, cough a few times. This will help to make sure that your lungs are clear. If you have an incision on your chest or abdomen from surgery, place a pillow or a rolled-up towel firmly against the incision when you cough. This can help to reduce pain while taking deep breaths and coughing. General tips When you are able to get out of bed: Walk around often. Continue to take deep breaths and cough in order to clear your lungs. Keep using the incentive spirometer until your health care provider says it is okay to stop using it. If you have been in the hospital, you may be told to keep using the spirometer at home. Contact a health care provider if: You are having difficulty using the spirometer. You have trouble using the spirometer as often as instructed. Your pain medicine is not giving enough relief for you to use the spirometer as told. You have a fever. Get help right away if: You develop shortness of breath. You develop a cough with bloody mucus from the  lungs. You have fluid or blood coming from an incision site after you cough. Summary An incentive spirometer is a tool that can help you learn to take long, deep breaths to keep your lungs clear and active. You may be asked to use a spirometer after a surgery, if you have a lung problem or a history of smoking, or if you have been inactive for a long period of time. Use your incentive spirometer as instructed every 1-2 hours while you are awake. If you have an incision on your chest or abdomen, place a pillow or a rolled-up towel firmly against your incision when you cough. This will help to reduce pain. Get help right away if you have shortness of breath, you cough up bloody mucus, or blood comes from your incision when you cough. This information is not intended to replace advice given to you by your health care provider. Make sure you discuss any questions you have with your health care provider. Document Revised: 10/26/2019 Document Reviewed: 10/26/2019 Elsevier Patient Education  2023 [employer].

## 2024-07-22 ENCOUNTER — Telehealth: Payer: Self-pay | Admitting: Neurosurgery

## 2024-07-22 ENCOUNTER — Inpatient Hospital Stay
Admission: RE | Admit: 2024-07-22 | Discharge: 2024-07-22 | Disposition: A | Source: Ambulatory Visit | Attending: Vascular Surgery | Admitting: Vascular Surgery

## 2024-07-22 ENCOUNTER — Encounter: Payer: Self-pay | Admitting: Urgent Care

## 2024-07-22 DIAGNOSIS — M62562 Muscle wasting and atrophy, not elsewhere classified, left lower leg: Secondary | ICD-10-CM

## 2024-07-22 DIAGNOSIS — Z01812 Encounter for preprocedural laboratory examination: Secondary | ICD-10-CM

## 2024-07-22 DIAGNOSIS — M4310 Spondylolisthesis, site unspecified: Secondary | ICD-10-CM

## 2024-07-22 DIAGNOSIS — M5416 Radiculopathy, lumbar region: Secondary | ICD-10-CM

## 2024-07-22 DIAGNOSIS — Z01818 Encounter for other preprocedural examination: Secondary | ICD-10-CM

## 2024-07-22 LAB — URINALYSIS, COMPLETE (UACMP) WITH MICROSCOPIC
Bacteria, UA: NONE SEEN
Bilirubin Urine: NEGATIVE
Glucose, UA: NEGATIVE mg/dL
Hgb urine dipstick: NEGATIVE
Ketones, ur: NEGATIVE mg/dL
Leukocytes,Ua: NEGATIVE
Nitrite: NEGATIVE
Protein, ur: NEGATIVE mg/dL
Specific Gravity, Urine: 1.013 (ref 1.005–1.030)
pH: 6 (ref 5.0–8.0)

## 2024-07-22 LAB — TYPE AND SCREEN
ABO/RH(D): O POS
Antibody Screen: NEGATIVE

## 2024-07-22 LAB — SURGICAL PCR SCREEN
MRSA, PCR: NEGATIVE
Staphylococcus aureus: NEGATIVE

## 2024-07-22 NOTE — Telephone Encounter (Signed)
 Message that was left with the answering service on 07/21/2024.  Media Information  Document Information  AMB Correspondence  NEUROSURGERY ANSWERING SERVICE CALL  07/21/2024 14:19  Attached To:  [name] [name] Johnnye Garrel  Source Information  Default, Provider, MD

## 2024-07-22 NOTE — Telephone Encounter (Signed)
 The patient's spouse would like clarification on these things : Anticipated length of hospitalization for the upcoming procedure. Additionally, during the previous cervical vertebrae fusion, the patient was provided with a walker and a bedside commode. Should the walker be brought to the hospital, and will they be required postoperatively? Lastly, home health services were arranged following the prior surgery. Will similar arrangements be considered after this procedure? Mrs.Edgley would prefer a c/b and if you are unable to get her, please leave a vm.

## 2024-07-22 NOTE — Telephone Encounter (Signed)
 Called wife and left voicemail stating that average length of hospital stay (for 1 lvel ALIF) would be roughly 2-4 days. Informed her that DME and post hospital services would be determined during hospital stay. Advised her to call or send mychart message if she has any additional questions or concerns.

## 2024-07-23 ENCOUNTER — Encounter: Admitting: Occupational Therapy

## 2024-07-23 ENCOUNTER — Ambulatory Visit

## 2024-07-23 DIAGNOSIS — R4681 Obsessive-compulsive behavior: Secondary | ICD-10-CM | POA: Diagnosis not present

## 2024-07-23 DIAGNOSIS — F840 Autistic disorder: Secondary | ICD-10-CM | POA: Diagnosis not present

## 2024-07-23 DIAGNOSIS — F0670 Mild neurocognitive disorder due to known physiological condition without behavioral disturbance: Secondary | ICD-10-CM | POA: Diagnosis not present

## 2024-07-24 ENCOUNTER — Encounter: Payer: Self-pay | Admitting: Vascular Surgery

## 2024-07-24 DIAGNOSIS — Z7982 Long term (current) use of aspirin: Secondary | ICD-10-CM

## 2024-07-28 ENCOUNTER — Telehealth: Payer: Self-pay

## 2024-07-28 NOTE — Telephone Encounter (Signed)
 Returned patient's wife's call regarding surgery time. Informed her that surgery time was scheduled for roughly 250 minutes and that the room was reserved for 345 mins. Informed her that at this time, he was scheduled for 7am, but that someone would reach out today to tell them what time to show up at the hospital.

## 2024-07-29 ENCOUNTER — Inpatient Hospital Stay: Payer: Self-pay | Admitting: Urgent Care

## 2024-07-29 ENCOUNTER — Inpatient Hospital Stay
Admission: RE | Admit: 2024-07-29 | Discharge: 2024-08-03 | DRG: 402 | Disposition: A | Attending: Neurosurgery | Admitting: Neurosurgery

## 2024-07-29 ENCOUNTER — Encounter: Admission: RE | Disposition: A | Payer: Self-pay | Source: Home / Self Care | Attending: Neurosurgery

## 2024-07-29 ENCOUNTER — Inpatient Hospital Stay

## 2024-07-29 ENCOUNTER — Encounter: Payer: Self-pay | Admitting: Vascular Surgery

## 2024-07-29 ENCOUNTER — Other Ambulatory Visit: Payer: Self-pay

## 2024-07-29 ENCOUNTER — Encounter: Payer: Self-pay | Admitting: Urgent Care

## 2024-07-29 DIAGNOSIS — Z8616 Personal history of COVID-19: Secondary | ICD-10-CM | POA: Diagnosis not present

## 2024-07-29 DIAGNOSIS — M62562 Muscle wasting and atrophy, not elsewhere classified, left lower leg: Secondary | ICD-10-CM | POA: Diagnosis not present

## 2024-07-29 DIAGNOSIS — Z8249 Family history of ischemic heart disease and other diseases of the circulatory system: Secondary | ICD-10-CM

## 2024-07-29 DIAGNOSIS — I10 Essential (primary) hypertension: Secondary | ICD-10-CM | POA: Diagnosis present

## 2024-07-29 DIAGNOSIS — M62561 Muscle wasting and atrophy, not elsewhere classified, right lower leg: Secondary | ICD-10-CM | POA: Diagnosis present

## 2024-07-29 DIAGNOSIS — K219 Gastro-esophageal reflux disease without esophagitis: Secondary | ICD-10-CM | POA: Diagnosis not present

## 2024-07-29 DIAGNOSIS — M4316 Spondylolisthesis, lumbar region: Secondary | ICD-10-CM | POA: Diagnosis present

## 2024-07-29 DIAGNOSIS — Z8673 Personal history of transient ischemic attack (TIA), and cerebral infarction without residual deficits: Secondary | ICD-10-CM | POA: Diagnosis not present

## 2024-07-29 DIAGNOSIS — M5416 Radiculopathy, lumbar region: Secondary | ICD-10-CM | POA: Diagnosis not present

## 2024-07-29 DIAGNOSIS — Z7982 Long term (current) use of aspirin: Secondary | ICD-10-CM

## 2024-07-29 DIAGNOSIS — M4310 Spondylolisthesis, site unspecified: Secondary | ICD-10-CM | POA: Diagnosis not present

## 2024-07-29 DIAGNOSIS — Z807 Family history of other malignant neoplasms of lymphoid, hematopoietic and related tissues: Secondary | ICD-10-CM

## 2024-07-29 DIAGNOSIS — M4317 Spondylolisthesis, lumbosacral region: Principal | ICD-10-CM | POA: Diagnosis present

## 2024-07-29 DIAGNOSIS — Z01818 Encounter for other preprocedural examination: Secondary | ICD-10-CM

## 2024-07-29 DIAGNOSIS — R443 Hallucinations, unspecified: Secondary | ICD-10-CM | POA: Diagnosis not present

## 2024-07-29 DIAGNOSIS — F419 Anxiety disorder, unspecified: Secondary | ICD-10-CM | POA: Diagnosis not present

## 2024-07-29 DIAGNOSIS — Z981 Arthrodesis status: Secondary | ICD-10-CM

## 2024-07-29 DIAGNOSIS — Z803 Family history of malignant neoplasm of breast: Secondary | ICD-10-CM

## 2024-07-29 DIAGNOSIS — Z808 Family history of malignant neoplasm of other organs or systems: Secondary | ICD-10-CM

## 2024-07-29 DIAGNOSIS — Z823 Family history of stroke: Secondary | ICD-10-CM | POA: Diagnosis not present

## 2024-07-29 HISTORY — DX: Anxiety disorder, unspecified: F41.9

## 2024-07-29 HISTORY — PX: APPLICATION OF CELL SAVER: SHX7529

## 2024-07-29 HISTORY — PX: ABDOMINAL EXPOSURE: SHX5708

## 2024-07-29 HISTORY — DX: Long term (current) use of aspirin: Z79.82

## 2024-07-29 HISTORY — PX: APPLICATION OF INTRAOPERATIVE CT SCAN: SHX6668

## 2024-07-29 HISTORY — PX: ANTERIOR AND POSTERIOR SPINAL FUSION: SHX2259

## 2024-07-29 SURGERY — ABDOMINAL EXPOSURE
Anesthesia: General

## 2024-07-29 MED ORDER — OXYCODONE HCL 5 MG PO TABS
5.0000 mg | ORAL_TABLET | ORAL | Status: DC | PRN
  Administered 2024-07-29: 5 mg via ORAL
  Filled 2024-07-29: qty 1

## 2024-07-29 MED ORDER — LACTATED RINGERS IV SOLN: INTRAVENOUS | Status: DC | PRN

## 2024-07-29 MED ORDER — BUPIVACAINE LIPOSOME 1.3 % IJ SUSP
INTRAMUSCULAR | Status: AC
  Filled 2024-07-29: qty 20

## 2024-07-29 MED ORDER — OXYCODONE HCL 5 MG PO TABS
10.0000 mg | ORAL_TABLET | ORAL | Status: DC | PRN
  Administered 2024-07-29 – 2024-07-30 (×3): 10 mg via ORAL
  Filled 2024-07-29 (×3): qty 2

## 2024-07-29 MED ORDER — EPHEDRINE 5 MG/ML INJ
INTRAVENOUS | Status: AC
  Filled 2024-07-29: qty 10

## 2024-07-29 MED ORDER — IRRISEPT - 450ML BOTTLE WITH 0.05% CHG IN STERILE WATER, USP 99.95% OPTIME
TOPICAL | Status: DC | PRN
  Administered 2024-07-29: 450 mL

## 2024-07-29 MED ORDER — ENOXAPARIN SODIUM 40 MG/0.4ML IJ SOSY
40.0000 mg | PREFILLED_SYRINGE | INTRAMUSCULAR | Status: DC
  Administered 2024-07-30 – 2024-08-02 (×4): 40 mg via SUBCUTANEOUS
  Filled 2024-07-29 (×4): qty 0.4

## 2024-07-29 MED ORDER — ACETAMINOPHEN 325 MG PO TABS: 650.0000 mg | ORAL_TABLET | ORAL | Status: DC | PRN

## 2024-07-29 MED ORDER — IRBESARTAN 150 MG PO TABS
150.0000 mg | ORAL_TABLET | Freq: Every day | ORAL | Status: DC
  Administered 2024-07-29 – 2024-08-02 (×5): 150 mg via ORAL
  Filled 2024-07-29 (×5): qty 1

## 2024-07-29 MED ORDER — MENTHOL 3 MG MT LOZG
1.0000 | LOZENGE | OROMUCOSAL | Status: DC | PRN
  Administered 2024-08-01: 3 mg via ORAL
  Filled 2024-07-29: qty 9

## 2024-07-29 MED ORDER — VANCOMYCIN HCL IN DEXTROSE 1-5 GM/200ML-% IV SOLN
INTRAVENOUS | Status: AC
  Filled 2024-07-29: qty 200

## 2024-07-29 MED ORDER — SODIUM CHLORIDE 0.9 % IV SOLN
250.0000 mL | INTRAVENOUS | Status: AC
  Administered 2024-07-29: 250 mL via INTRAVENOUS

## 2024-07-29 MED ORDER — ORAL CARE MOUTH RINSE: 15.0000 mL | Freq: Once | OROMUCOSAL | Status: AC

## 2024-07-29 MED ORDER — CHLORHEXIDINE GLUCONATE CLOTH 2 % EX PADS
6.0000 | MEDICATED_PAD | Freq: Once | CUTANEOUS | Status: AC
  Administered 2024-07-29: 6 via TOPICAL

## 2024-07-29 MED ORDER — GLYCOPYRROLATE 0.2 MG/ML IJ SOLN
INTRAMUSCULAR | Status: AC
  Filled 2024-07-29: qty 1

## 2024-07-29 MED ORDER — LIDOCAINE HCL (CARDIAC) PF 100 MG/5ML IV SOSY
PREFILLED_SYRINGE | INTRAVENOUS | Status: DC | PRN
  Administered 2024-07-29: 100 mg via INTRAVENOUS

## 2024-07-29 MED ORDER — HYDROCHLOROTHIAZIDE 12.5 MG PO TABS
12.5000 mg | ORAL_TABLET | Freq: Every day | ORAL | Status: DC
  Administered 2024-07-29 – 2024-08-02 (×5): 12.5 mg via ORAL
  Filled 2024-07-29 (×5): qty 1

## 2024-07-29 MED ORDER — LACTATED RINGERS IV SOLN: INTRAVENOUS | Status: DC

## 2024-07-29 MED ORDER — OXYCODONE HCL 5 MG PO TABS
5.0000 mg | ORAL_TABLET | Freq: Once | ORAL | Status: AC | PRN
  Administered 2024-07-29: 5 mg via ORAL

## 2024-07-29 MED ORDER — SORBITOL 70 % SOLN
30.0000 mL | Freq: Every day | Status: DC | PRN
  Administered 2024-08-01: 30 mL via ORAL
  Filled 2024-07-29 (×2): qty 30

## 2024-07-29 MED ORDER — FENTANYL CITRATE (PF) 100 MCG/2ML IJ SOLN
INTRAMUSCULAR | Status: AC
  Filled 2024-07-29: qty 2

## 2024-07-29 MED ORDER — SODIUM CHLORIDE 0.9% FLUSH
3.0000 mL | Freq: Two times a day (BID) | INTRAVENOUS | Status: DC
  Administered 2024-07-29 – 2024-08-02 (×10): 3 mL via INTRAVENOUS

## 2024-07-29 MED ORDER — ALBUMIN HUMAN 5 % IV SOLN
INTRAVENOUS | Status: AC
  Filled 2024-07-29: qty 250

## 2024-07-29 MED ORDER — EPHEDRINE SULFATE-NACL 50-0.9 MG/10ML-% IV SOSY
PREFILLED_SYRINGE | INTRAVENOUS | Status: DC | PRN
  Administered 2024-07-29: 10 mg via INTRAVENOUS

## 2024-07-29 MED ORDER — ALBUMIN HUMAN 5 % IV SOLN: INTRAVENOUS | Status: DC | PRN

## 2024-07-29 MED ORDER — MIRTAZAPINE 15 MG PO TBDP
30.0000 mg | ORAL_TABLET | Freq: Every day | ORAL | Status: DC
  Administered 2024-07-29 – 2024-08-02 (×5): 30 mg via ORAL
  Filled 2024-07-29 (×5): qty 2

## 2024-07-29 MED ORDER — FENTANYL CITRATE (PF) 100 MCG/2ML IJ SOLN
25.0000 ug | INTRAMUSCULAR | Status: DC | PRN
  Administered 2024-07-29 (×3): 50 ug via INTRAVENOUS

## 2024-07-29 MED ORDER — PROPOFOL 10 MG/ML IV BOLUS
INTRAVENOUS | Status: DC | PRN
  Administered 2024-07-29: 150 mg via INTRAVENOUS
  Administered 2024-07-29: 20 mg via INTRAVENOUS

## 2024-07-29 MED ORDER — FENTANYL CITRATE (PF) 100 MCG/2ML IJ SOLN
INTRAMUSCULAR | Status: DC | PRN
  Administered 2024-07-29: 100 ug via INTRAVENOUS

## 2024-07-29 MED ORDER — PROPOFOL 10 MG/ML IV BOLUS
INTRAVENOUS | Status: AC
  Filled 2024-07-29: qty 20

## 2024-07-29 MED ORDER — POLYETHYLENE GLYCOL 3350 17 G PO PACK: 17.0000 g | PACK | Freq: Every day | ORAL | Status: DC | PRN

## 2024-07-29 MED ORDER — HYDROMORPHONE HCL 1 MG/ML IJ SOLN
INTRAMUSCULAR | Status: AC
  Filled 2024-07-29: qty 1

## 2024-07-29 MED ORDER — ACETAMINOPHEN 650 MG RE SUPP: 650.0000 mg | RECTAL | Status: DC | PRN

## 2024-07-29 MED ORDER — OXIDIZED CELLULOSE EX PADS
MEDICATED_PAD | CUTANEOUS | Status: DC | PRN
  Administered 2024-07-29: 1 via TOPICAL

## 2024-07-29 MED ORDER — OXYCODONE HCL 5 MG PO TABS
ORAL_TABLET | ORAL | Status: AC
  Filled 2024-07-29: qty 1

## 2024-07-29 MED ORDER — CEFAZOLIN SODIUM-DEXTROSE 2-4 GM/100ML-% IV SOLN
INTRAVENOUS | Status: AC
  Filled 2024-07-29: qty 100

## 2024-07-29 MED ORDER — SODIUM CHLORIDE (PF) 0.9 % IJ SOLN
INTRAMUSCULAR | Status: AC
  Filled 2024-07-29: qty 20

## 2024-07-29 MED ORDER — DEXMEDETOMIDINE HCL IN NACL 80 MCG/20ML IV SOLN
INTRAVENOUS | Status: DC | PRN
  Administered 2024-07-29 (×2): 8 ug via INTRAVENOUS

## 2024-07-29 MED ORDER — ONDANSETRON HCL 4 MG/2ML IJ SOLN
INTRAMUSCULAR | Status: AC
  Filled 2024-07-29: qty 2

## 2024-07-29 MED ORDER — KETAMINE HCL 50 MG/5ML IJ SOSY
PREFILLED_SYRINGE | INTRAMUSCULAR | Status: AC
  Filled 2024-07-29: qty 5

## 2024-07-29 MED ORDER — MIDAZOLAM HCL 2 MG/2ML IJ SOLN
INTRAMUSCULAR | Status: AC
  Filled 2024-07-29: qty 2

## 2024-07-29 MED ORDER — BUPIVACAINE-EPINEPHRINE (PF) 0.5% -1:200000 IJ SOLN
INTRAMUSCULAR | Status: DC | PRN
  Administered 2024-07-29: 10 mL

## 2024-07-29 MED ORDER — ACETAMINOPHEN 10 MG/ML IV SOLN
INTRAVENOUS | Status: AC
  Filled 2024-07-29: qty 100

## 2024-07-29 MED ORDER — DEXTROMETHORPHAN POLISTIREX ER 30 MG/5ML PO SUER
30.0000 mg | Freq: Every evening | ORAL | Status: DC | PRN
  Administered 2024-07-29 – 2024-07-30 (×2): 30 mg via ORAL
  Filled 2024-07-29 (×4): qty 5

## 2024-07-29 MED ORDER — LORATADINE 10 MG PO TABS
10.0000 mg | ORAL_TABLET | Freq: Every day | ORAL | Status: DC
  Administered 2024-07-29 – 2024-08-02 (×5): 10 mg via ORAL
  Filled 2024-07-29 (×5): qty 1

## 2024-07-29 MED ORDER — ROCURONIUM BROMIDE 10 MG/ML (PF) SYRINGE
PREFILLED_SYRINGE | INTRAVENOUS | Status: AC
  Filled 2024-07-29: qty 10

## 2024-07-29 MED ORDER — SUMATRIPTAN SUCCINATE 50 MG PO TABS: 100.0000 mg | ORAL_TABLET | ORAL | Status: DC | PRN

## 2024-07-29 MED ORDER — ONDANSETRON HCL 4 MG PO TABS: 4.0000 mg | ORAL_TABLET | Freq: Four times a day (QID) | ORAL | Status: DC | PRN

## 2024-07-29 MED ORDER — LIDOCAINE HCL (PF) 2 % IJ SOLN
INTRAMUSCULAR | Status: AC
  Filled 2024-07-29: qty 5

## 2024-07-29 MED ORDER — KETOROLAC TROMETHAMINE 15 MG/ML IJ SOLN
INTRAMUSCULAR | Status: AC
  Filled 2024-07-29: qty 1

## 2024-07-29 MED ORDER — PHENYLEPHRINE HCL-NACL 20-0.9 MG/250ML-% IV SOLN
INTRAVENOUS | Status: DC | PRN
  Administered 2024-07-29: 40 ug/min via INTRAVENOUS
  Administered 2024-07-29: 70 ug/min via INTRAVENOUS

## 2024-07-29 MED ORDER — ACETAMINOPHEN 500 MG PO TABS
1000.0000 mg | ORAL_TABLET | Freq: Four times a day (QID) | ORAL | Status: DC
  Administered 2024-07-29 – 2024-08-03 (×16): 1000 mg via ORAL
  Filled 2024-07-29 (×18): qty 2

## 2024-07-29 MED ORDER — ROCURONIUM BROMIDE 100 MG/10ML IV SOLN
INTRAVENOUS | Status: DC | PRN
  Administered 2024-07-29: 30 mg via INTRAVENOUS
  Administered 2024-07-29: 60 mg via INTRAVENOUS
  Administered 2024-07-29: 70 mg via INTRAVENOUS
  Administered 2024-07-29: 40 mg via INTRAVENOUS

## 2024-07-29 MED ORDER — SURGIFLO WITH THROMBIN (HEMOSTATIC MATRIX KIT) OPTIME
TOPICAL | Status: DC | PRN
  Administered 2024-07-29: 1

## 2024-07-29 MED ORDER — SODIUM CHLORIDE 0.9 % IV SOLN: INTRAVENOUS | Status: DC | PRN

## 2024-07-29 MED ORDER — SODIUM CHLORIDE 0.9% FLUSH: 3.0000 mL | INTRAVENOUS | Status: DC | PRN

## 2024-07-29 MED ORDER — PHENYLEPHRINE HCL-NACL 20-0.9 MG/250ML-% IV SOLN
INTRAVENOUS | Status: AC
  Filled 2024-07-29: qty 250

## 2024-07-29 MED ORDER — BUPIVACAINE-EPINEPHRINE (PF) 0.5% -1:200000 IJ SOLN
INTRAMUSCULAR | Status: AC
  Filled 2024-07-29: qty 20

## 2024-07-29 MED ORDER — SODIUM CHLORIDE (PF) 0.9 % IJ SOLN
INTRAMUSCULAR | Status: DC | PRN
  Administered 2024-07-29: 60 mL

## 2024-07-29 MED ORDER — ACETAMINOPHEN 10 MG/ML IV SOLN
INTRAVENOUS | Status: DC | PRN
  Administered 2024-07-29: 1000 mg via INTRAVENOUS

## 2024-07-29 MED ORDER — PROPOFOL 1000 MG/100ML IV EMUL
INTRAVENOUS | Status: AC
  Filled 2024-07-29: qty 100

## 2024-07-29 MED ORDER — ASPIRIN 81 MG PO TBEC
81.0000 mg | DELAYED_RELEASE_TABLET | Freq: Every day | ORAL | Status: DC
  Administered 2024-07-29 – 2024-08-02 (×5): 81 mg via ORAL
  Filled 2024-07-29 (×5): qty 1

## 2024-07-29 MED ORDER — ONDANSETRON HCL 4 MG/2ML IJ SOLN
INTRAMUSCULAR | Status: DC | PRN
  Administered 2024-07-29: 4 mg via INTRAVENOUS

## 2024-07-29 MED ORDER — CEFAZOLIN SODIUM-DEXTROSE 2-4 GM/100ML-% IV SOLN
2.0000 g | Freq: Once | INTRAVENOUS | Status: AC
  Administered 2024-07-29 (×2): 2 g via INTRAVENOUS

## 2024-07-29 MED ORDER — PHENOL 1.4 % MT LIQD
1.0000 | OROMUCOSAL | Status: DC | PRN
  Filled 2024-07-29 (×2): qty 177

## 2024-07-29 MED ORDER — PHENYLEPHRINE 80 MCG/ML (10ML) SYRINGE FOR IV PUSH (FOR BLOOD PRESSURE SUPPORT)
PREFILLED_SYRINGE | INTRAVENOUS | Status: AC
  Filled 2024-07-29: qty 10

## 2024-07-29 MED ORDER — MAGNESIUM CITRATE PO SOLN: 1.0000 | Freq: Once | ORAL | Status: DC | PRN

## 2024-07-29 MED ORDER — DEXAMETHASONE SOD PHOSPHATE PF 10 MG/ML IJ SOLN
INTRAMUSCULAR | Status: DC | PRN
  Administered 2024-07-29: 10 mg via INTRAVENOUS

## 2024-07-29 MED ORDER — SENNA 8.6 MG PO TABS
1.0000 | ORAL_TABLET | Freq: Two times a day (BID) | ORAL | Status: DC
  Administered 2024-07-29 – 2024-08-02 (×10): 8.6 mg via ORAL
  Filled 2024-07-29 (×10): qty 1

## 2024-07-29 MED ORDER — HYDROMORPHONE HCL 1 MG/ML IJ SOLN: 0.5000 mg | INTRAMUSCULAR | Status: AC | PRN

## 2024-07-29 MED ORDER — DM-APAP-CPM 10-325-2 MG PO TABS: 1.0000 | ORAL_TABLET | Freq: Every day | ORAL | Status: DC | PRN

## 2024-07-29 MED ORDER — ONDANSETRON HCL 4 MG/2ML IJ SOLN: 4.0000 mg | Freq: Four times a day (QID) | INTRAMUSCULAR | Status: DC | PRN

## 2024-07-29 MED ORDER — 0.9 % SODIUM CHLORIDE (POUR BTL) OPTIME
TOPICAL | Status: DC | PRN
  Administered 2024-07-29: 500 mL

## 2024-07-29 MED ORDER — REMIFENTANIL HCL 1 MG IV SOLR
INTRAVENOUS | Status: AC
  Filled 2024-07-29: qty 1000

## 2024-07-29 MED ORDER — KETAMINE HCL 50 MG/5ML IJ SOSY
PREFILLED_SYRINGE | INTRAMUSCULAR | Status: DC | PRN
  Administered 2024-07-29: 10 mg via INTRAVENOUS
  Administered 2024-07-29: 40 mg via INTRAVENOUS

## 2024-07-29 MED ORDER — AMLODIPINE BESYLATE 5 MG PO TABS
5.0000 mg | ORAL_TABLET | Freq: Every day | ORAL | Status: DC
  Administered 2024-07-30 – 2024-08-02 (×4): 5 mg via ORAL
  Filled 2024-07-29 (×4): qty 1

## 2024-07-29 MED ORDER — KETOROLAC TROMETHAMINE 15 MG/ML IJ SOLN
15.0000 mg | Freq: Four times a day (QID) | INTRAMUSCULAR | Status: AC
  Administered 2024-07-29 – 2024-07-30 (×4): 15 mg via INTRAVENOUS
  Filled 2024-07-29 (×3): qty 1

## 2024-07-29 MED ORDER — VANCOMYCIN HCL IN DEXTROSE 1-5 GM/200ML-% IV SOLN
1000.0000 mg | Freq: Once | INTRAVENOUS | Status: AC
  Administered 2024-07-29: 1000 mg via INTRAVENOUS

## 2024-07-29 MED ORDER — CHLORHEXIDINE GLUCONATE 0.12 % MT SOLN
15.0000 mL | Freq: Once | OROMUCOSAL | Status: AC
  Administered 2024-07-29: 15 mL via OROMUCOSAL

## 2024-07-29 MED ORDER — DOCUSATE SODIUM 100 MG PO CAPS
100.0000 mg | ORAL_CAPSULE | Freq: Two times a day (BID) | ORAL | Status: DC
  Administered 2024-07-29 – 2024-08-02 (×10): 100 mg via ORAL
  Filled 2024-07-29 (×10): qty 1

## 2024-07-29 MED ORDER — BUPIVACAINE HCL (PF) 0.5 % IJ SOLN
INTRAMUSCULAR | Status: AC
  Filled 2024-07-29: qty 30

## 2024-07-29 MED ORDER — MIDAZOLAM HCL (PF) 2 MG/2ML IJ SOLN
INTRAMUSCULAR | Status: DC | PRN
  Administered 2024-07-29: 2 mg via INTRAVENOUS

## 2024-07-29 MED ORDER — PHENYLEPHRINE 80 MCG/ML (10ML) SYRINGE FOR IV PUSH (FOR BLOOD PRESSURE SUPPORT)
PREFILLED_SYRINGE | INTRAVENOUS | Status: DC | PRN
  Administered 2024-07-29 (×3): 160 ug via INTRAVENOUS

## 2024-07-29 MED ORDER — OXYCODONE HCL 5 MG/5ML PO SOLN: 5.0000 mg | Freq: Once | ORAL | Status: AC | PRN

## 2024-07-29 MED ORDER — HYDROMORPHONE HCL 1 MG/ML IJ SOLN
INTRAMUSCULAR | Status: DC | PRN
  Administered 2024-07-29: 1 mg via INTRAVENOUS
  Administered 2024-07-29: .5 mg via INTRAVENOUS

## 2024-07-29 MED ORDER — PANTOPRAZOLE SODIUM 40 MG PO TBEC
80.0000 mg | DELAYED_RELEASE_TABLET | Freq: Every day | ORAL | Status: DC
  Administered 2024-07-30 – 2024-08-02 (×4): 80 mg via ORAL
  Filled 2024-07-29 (×4): qty 2

## 2024-07-29 MED ORDER — GLYCOPYRROLATE 0.2 MG/ML IJ SOLN
INTRAMUSCULAR | Status: DC | PRN
  Administered 2024-07-29: .2 mg via INTRAVENOUS

## 2024-07-29 MED ORDER — CHLORHEXIDINE GLUCONATE 0.12 % MT SOLN
OROMUCOSAL | Status: AC
  Filled 2024-07-29: qty 15

## 2024-07-29 MED ORDER — SUGAMMADEX SODIUM 200 MG/2ML IV SOLN
INTRAVENOUS | Status: DC | PRN
  Administered 2024-07-29: 244.8 mg via INTRAVENOUS

## 2024-07-29 MED ORDER — BUPIVACAINE-EPINEPHRINE (PF) 0.5% -1:200000 IJ SOLN
INTRAMUSCULAR | Status: AC
  Filled 2024-07-29: qty 10

## 2024-07-29 SURGICAL SUPPLY — 85 items
ALLOGRAFT BONE FIBER KORE 5 (Bone Implant) IMPLANT
CHLORAPREP W/TINT 26 (MISCELLANEOUS) ×4 IMPLANT
CLIP APPLIE 11 MED OPEN (CLIP) IMPLANT
CORD BIP STRL DISP 12FT (MISCELLANEOUS) ×2 IMPLANT
COUNTER NDL MAGNETIC 40 RED (SET/KITS/TRAYS/PACK) ×2 IMPLANT
COUNTER NEEDLE MAGNETIC 40 RED (SET/KITS/TRAYS/PACK) ×2 IMPLANT
DERMABOND ADVANCED .7 DNX12 (GAUZE/BANDAGES/DRESSINGS) ×6 IMPLANT
DRAPE C ARM PK CFD 31 SPINE (DRAPES) ×2 IMPLANT
DRAPE C-ARMOR (DRAPES) IMPLANT
DRAPE INCISE IOBAN 66X60 STRL (DRAPES) ×2 IMPLANT
DRAPE LAPAROTOMY 100X77 ABD (DRAPES) ×2 IMPLANT
DRAPE SCAN PATIENT (DRAPES) ×1 IMPLANT
DRAPE SHEET LG 3/4 BI-LAMINATE (DRAPES) ×4 IMPLANT
DRAPE TABLE BACK 80X90 (DRAPES) ×2 IMPLANT
DRESSING SURGICEL FIBRLLR 1X2 (HEMOSTASIS) IMPLANT
DRSG OPSITE POSTOP 4X6 (GAUZE/BANDAGES/DRESSINGS) IMPLANT
DRSG OPSITE POSTOP 4X8 (GAUZE/BANDAGES/DRESSINGS) IMPLANT
DRSG TEGADERM 2-3/8X2-3/4 SM (GAUZE/BANDAGES/DRESSINGS) IMPLANT
DRSG TEGADERM 4X4.75 (GAUZE/BANDAGES/DRESSINGS) IMPLANT
ELECT BLADE 6.5 EXT (BLADE) ×2 IMPLANT
ELECT COATED BLADE 2.86 ST (ELECTRODE) ×2 IMPLANT
ELECTRODE REM PT RTRN 9FT ADLT (ELECTROSURGICAL) ×4 IMPLANT
EX-PIN ORTHOLOCK NAV 4X150 (PIN) ×2 IMPLANT
FEE CVG SUPP BRAINLAB NG SPNE (MISCELLANEOUS) ×1 IMPLANT
FEE INTRAOP CADWELL SUPPLY NCS (MISCELLANEOUS) IMPLANT
FEE INTRAOP MONITOR IMPULS NCS (MISCELLANEOUS) IMPLANT
FORCEPS BPLR BAYO 10IN 1.0TIP (ORTHOPEDIC DISPOSABLE SUPPLIES) IMPLANT
GAUZE SPONGE 2X2 STRL 8-PLY (GAUZE/BANDAGES/DRESSINGS) IMPLANT
GLOVE BIOGEL PI IND STRL 6.5 (GLOVE) ×4 IMPLANT
GLOVE SURG SYN 6.5 PF PI (GLOVE) ×4 IMPLANT
GLOVE SURG SYN 7.0 PF PI (GLOVE) ×8 IMPLANT
GLOVE SURG SYN 8.0 PF PI (GLOVE) ×6 IMPLANT
GLOVE SURG SYN 8.5 PF PI (GLOVE) ×12 IMPLANT
GOWN SRG LRG LVL 4 IMPRV REINF (GOWNS) ×2 IMPLANT
GOWN SRG XL LVL 3 NONREINFORCE (GOWNS) ×6 IMPLANT
GOWN STRL REUS W/ TWL XL LVL3 (GOWN DISPOSABLE) ×8 IMPLANT
GUIDEWIRE NITINOL BEVEL TIP (WIRE) IMPLANT
HOLDER FOLEY CATH W/STRAP (MISCELLANEOUS) ×2 IMPLANT
KIT DISP MARS 3V (KITS) IMPLANT
KIT SPINAL PRONEVIEW (KITS) ×2 IMPLANT
KIT TURNOVER KIT A (KITS) ×2 IMPLANT
KITTNER LAPARASCOPIC 5X40 (MISCELLANEOUS) IMPLANT
LAVAGE JET IRRISEPT WOUND (IRRIGATION / IRRIGATOR) ×2 IMPLANT
LOOP VESSEL MAXI 1X406 RED (MISCELLANEOUS) ×2 IMPLANT
LOOP VESSEL MINI 0.8X406 BLUE (MISCELLANEOUS) ×4 IMPLANT
MANIFOLD NEPTUNE II (INSTRUMENTS) ×4 IMPLANT
MARKER SKIN DUAL TIP RULER LAB (MISCELLANEOUS) ×2 IMPLANT
MARKER SPHERE PSV REFLC 13MM (MARKER) ×7 IMPLANT
NDL SAFETY ECLIP 18X1.5 (MISCELLANEOUS) ×2 IMPLANT
PACK LAMINECTOMY ARMC (PACKS) ×2 IMPLANT
PACK UNIVERSAL (MISCELLANEOUS) ×2 IMPLANT
PAD ARMBOARD POSITIONER FOAM (MISCELLANEOUS) ×2 IMPLANT
PLEDGET CV PTFE 7X3 (MISCELLANEOUS) IMPLANT
ROD RELINE MAS TI 5.5X35MM LRD (Rod) IMPLANT
ROD RELINE MAS TI LORD 5.5X40 (Rod) IMPLANT
SCREW ANT CERV SD IND 5.5X25 (Screw) IMPLANT
SCREW LOCK RELINE 5.5 TULIP (Screw) IMPLANT
SCREW RELINE RED 6.5X45MM POLY (Screw) IMPLANT
SOLN STERILE WATER BTL 1000 ML (IV SOLUTION) ×2 IMPLANT
SPACER HEDRON IA 24X30X15 15D (Spacer) IMPLANT
SPONGE KITTNER 5P (MISCELLANEOUS) ×2 IMPLANT
SPONGE T-LAP 18X18 ~~LOC~~+RFID (SPONGE) ×2 IMPLANT
STAPLER SKIN PROX 35W (STAPLE) ×2 IMPLANT
SURGIFLO W/THROMBIN 8M KIT (HEMOSTASIS) ×2 IMPLANT
SUT PROLENE 2 0 SH DA (SUTURE) IMPLANT
SUT PROLENE 3 0 SH DA (SUTURE) IMPLANT
SUT PROLENE 4 0 SH DA (SUTURE) IMPLANT
SUT PROLENE 5 0 RB 1 DA (SUTURE) IMPLANT
SUT SILK 2-0 30XBRD TIE 12 (SUTURE) ×2 IMPLANT
SUT SILK 3 0 REEL (SUTURE) ×2 IMPLANT
SUT STRATA 3-0 15 PS-2 (SUTURE) ×4 IMPLANT
SUT VIC AB 0 CT1 27XCR 8 STRN (SUTURE) ×4 IMPLANT
SUT VIC AB 0 CT1 36 (SUTURE) IMPLANT
SUT VIC AB 2-0 CT1 (SUTURE) IMPLANT
SUT VIC AB 2-0 CT1 18 (SUTURE) ×4 IMPLANT
SUT VIC AB 3-0 SH 27X BRD (SUTURE) IMPLANT
SUT VIC AB 4-0 PS2 27 (SUTURE) IMPLANT
SUTURE EHLN 3-0 FS-10 30 BLK (SUTURE) IMPLANT
SUTURE MNCRL 4-0 27XMF (SUTURE) IMPLANT
SYR 10ML LL (SYRINGE) ×2 IMPLANT
SYR 30ML LL (SYRINGE) ×4 IMPLANT
TOWEL OR 17X26 4PK STRL BLUE (TOWEL DISPOSABLE) ×6 IMPLANT
TRAP FLUID SMOKE EVACUATOR (MISCELLANEOUS) ×2 IMPLANT
TRAY FOLEY SLVR 16FR LF STAT (SET/KITS/TRAYS/PACK) ×2 IMPLANT
TUBING CONNECTING 10 (TUBING) ×2 IMPLANT

## 2024-07-29 NOTE — TOC CM/SW Note (Signed)
 Transition of Care [facility]) CM/SW Note    Transition of Care [facility]) - Inpatient Brief Assessment   Patient Details  Name: [name] MRN: [id number] Date of Birth: [birth year]  Transition of Care [facility]) CM/SW Contact:    Alvaro Louder, LCSW Phone Number: 07/29/2024, 3:06 PM   Clinical Narrative:  Per chart review TOC consulted for SNF Placement/ Home health. LCSWA to follow recommendations placed by PT and OT.  TOC to follow for discharge  Transition of Care Asessment: Insurance and Status: Insurance coverage has been reviewed Patient has primary care physician: Yes Home environment has been reviewed: Single Family home Prior level of function:: Ox4 Prior/Current Home Services: No current home services Social Drivers of Health Review: SDOH reviewed no interventions necessary Readmission risk has been reviewed: Yes Transition of care needs: no transition of care needs at this time

## 2024-07-29 NOTE — Interval H&P Note (Signed)
 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  The various methods of treatment have been discussed with the patient and family. After consideration of risks, benefits and other options for treatment, the patient has consented to  Procedure(s) with comments: ABDOMINAL EXPOSURE (N/A) ANTERIOR AND POSTERIOR SPINAL FUSION (N/A) - L5-S1 ANTERIOR LUMBAR INTERBODY FUSION AND POSTERIOR SPINAL FUSION APPLICATION OF INTRAOPERATIVE CT SCAN (N/A) APPLICATION OF CELL SAVER (N/A) as a surgical intervention.  The patient's history has been reviewed, patient examined, no change in status, stable for surgery.  I have reviewed the patient's chart and labs.  Questions were answered to the patient's satisfaction.     [name]

## 2024-07-29 NOTE — Transfer of Care (Signed)
 Immediate Anesthesia Transfer of Care Note  Patient: [name]  Procedure(s) Performed: ABDOMINAL EXPOSURE ANTERIOR AND POSTERIOR SPINAL FUSION APPLICATION OF INTRAOPERATIVE CT SCAN APPLICATION OF CELL SAVER  Patient Location: PACU  Anesthesia Type:General  Level of Consciousness: drowsy  Airway & Oxygen Therapy: Patient Spontanous Breathing and Patient connected to face mask oxygen  Post-op Assessment: Report given to RN and Post -op Vital signs reviewed and stable  Post vital signs: Reviewed and stable  Last Vitals:  Vitals Value Taken Time  BP 130/106 07/29/24 12:40  Temp    Pulse 83 07/29/24 12:45  Resp 15 07/29/24 12:45  SpO2 100 % 07/29/24 12:45  Vitals shown include unfiled device data.  Last Pain:  Vitals:   07/29/24 [id number]  TempSrc: Temporal  PainSc: 0-No pain         Complications: No notable events documented.

## 2024-07-29 NOTE — Anesthesia Postprocedure Evaluation (Signed)
 Anesthesia Post Note  Patient: [name]  Procedure(s) Performed: ABDOMINAL EXPOSURE ANTERIOR AND POSTERIOR SPINAL FUSION APPLICATION OF INTRAOPERATIVE CT SCAN APPLICATION OF CELL SAVER  Patient location during evaluation: PACU Anesthesia Type: General Level of consciousness: awake and alert Pain management: pain level controlled Vital Signs Assessment: post-procedure vital signs reviewed and stable Respiratory status: spontaneous breathing, nonlabored ventilation and respiratory function stable Cardiovascular status: blood pressure returned to baseline and stable Postop Assessment: no apparent nausea or vomiting Anesthetic complications: no   No notable events documented.   Last Vitals:  Vitals:   07/29/24 1330 07/29/24 1334  BP: 120/74   Pulse: 97 (!) 101  Resp: 10 16  Temp:    SpO2: 96% 96%    Last Pain:  Vitals:   07/29/24 1327  TempSrc:   PainSc: 8                  [name]

## 2024-07-29 NOTE — Discharge Instructions (Signed)
 Your surgeon has performed an operation on your lumbar spine (low back) to relieve pressure on one or more nerves. Many times, patients feel better immediately after surgery and can "overdo it." Even if you feel well, it is important that you follow these activity guidelines. If you do not let your back heal properly from the surgery, you can increase the chance of hardware complications and/or return of your symptoms. The following are instructions to help in your recovery once you have been discharged from the hospital.  Do not use NSAIDs for 3 months after surgery.  *Regarding compression stockings-  Please wear day and night until you are walking a couple hundred feet three times a day.   Activity    No bending, lifting, or twisting ("BLT"). Avoid lifting objects heavier than 10 pounds (gallon milk jug).  Where possible, avoid household activities that involve lifting, bending, pushing, or pulling such as laundry, vacuuming, grocery shopping, and childcare. Try to arrange for help from friends and family for these activities while your back heals.  Increase physical activity slowly as tolerated.  Taking short walks is encouraged, but avoid strenuous exercise. Do not jog, run, bicycle, lift weights, or participate in any other exercises unless specifically allowed by your doctor. Avoid prolonged sitting, including car rides.  Talk to your doctor before resuming sexual activity.  You should not drive until cleared by your doctor.  Until released by your doctor, you should not return to work or school.  You should rest at home and let your body heal.   You may shower three days after your surgery.  After showering, lightly dab your incision dry. Do not take a tub bath or go swimming for 3 weeks, or until approved by your doctor at your follow-up appointment.  If you smoke, we strongly recommend that you quit.  Smoking has been proven to interfere with normal healing in your back and will  dramatically reduce the success rate of your surgery. Please contact [facility] (800-QUIT-NOW) and use the resources at www.[facility].com for assistance in stopping smoking.  Surgical Incision   If you have a dressing on your incision, you may remove it three days after your surgery. Keep your incision area clean and dry.  Your incision was closed with Dermabond glue. The glue should begin to peel away within about a week.  Diet            You may return to your usual diet. Be sure to stay hydrated.  When to Contact Us   Although your surgery and recovery will likely be uneventful, you may have some residual numbness, aches, and pains in your back and/or legs. This is normal and should improve in the next few weeks.  However, should you experience any of the following, contact us  immediately: New numbness or weakness Pain that is progressively getting worse, and is not relieved by your pain medications or rest Bleeding, redness, swelling, pain, or drainage from surgical incision Chills or flu-like symptoms Fever greater than 101.0 F (38.3 C) Problems with bowel or bladder functions Difficulty breathing or shortness of breath Warmth, tenderness, or swelling in your calf  Contact Information How to contact us :  If you have any questions/concerns before or after surgery, you can reach us  at [phone], or you can send a mychart message. We can be reached by phone or mychart 8am-4pm, Monday-Friday.  *Please note: Calls after 4pm are forwarded to a third party answering service. Mychart messages are not routinely monitored during evenings,  weekends, and holidays. Please call our office to contact the answering service for urgent concerns during non-business hours.

## 2024-07-29 NOTE — H&P (Signed)
 Referring Physician:  No referring provider defined for this encounter.  Primary Physician:  Melvin Pao, NP  History of Present Illness: 07/29/2024 [name] presents today for surgical intervention.  He has continued symptoms as noted below.  03/26/2024 [name] returns to see me.  He has had worsened muscle function in his lower extremities.  He is seen in neuromuscular specialist at [facility] where nerve conduction study showed active denervation at L5.  He has a known spondylolisthesis with severe L5 nerve root compression.  He also has a known ulnar neuropathy for which he is seeing my partner.  He does not have traditional radicular symptoms.  02/12/2024 from [facility] note [name] is here today with a chief complaint of ulnar neuropathy. He has a significant history of cervicaly myelpathy as well as a brainstem stroke. He has had progressive worsening and wasting of his right hand. EMG demonstrated servere ulnar neuropathy.  He states that he gets no tingling or pain in the arm but he has noticed atrophy and weakness.  There is some concern whether or not he is losing dexterity but he states that he still able to do his buttons.  He does have tremulousness throughout his body.  He does notice that the micro shakes/movements in his right upper extremity have gotten worse.   Review of Systems:  A 10 point review of systems is negative, except for the pertinent positives and negatives detailed in the HPI.  Past Medical History: Past Medical History:  Diagnosis Date   ADHD    Anxiety    a.) on BZO (lorazepam ) PRN   Arthritis    Concussion    COVID 12/2020   GERD (gastroesophageal reflux disease)    Hypertension    Long-term use of aspirin  therapy    Multiple lacunar infarcts (HCC)    MVP (mitral valve prolapse)    Pernicious anemia    [place] spotted fever    Sleep apnea    Spinal stenosis    Stroke [facility])    Vestibular migraine     Vitamin B12 deficiency    Vitamin D  deficiency     Past Surgical History: Past Surgical History:  Procedure Laterality Date   ANTERIOR CERVICAL DECOMP/DISCECTOMY FUSION N/A 07/30/2022   Procedure: C4-7 ANTERIOR CERVICAL DISCECTOMY AND FUSION (GLOBUS HEDRON);  Surgeon: Clois Fret, MD;  Location: [facility] ORS;  Service: Neurosurgery;  Laterality: N/A;   BILATERAL CARPAL TUNNEL RELEASE     CERVICAL WOUND DEBRIDEMENT N/A 08/01/2022   Procedure: CERVICAL WOUND DEBRIDEMENT OF HEMATOMA;  Surgeon: Clois Fret, MD;  Location: [facility] OR[facility];  Service: Neurosurgery;  Laterality: N/A;   COLONOSCOPY     COLONOSCOPY WITH PROPOFOL  N/A 12/17/2023   Procedure: COLONOSCOPY WITH PROPOFOL ;  Surgeon: Maryruth Ole DASEN, MD;  Location: [facility];  Service: Endoscopy;  Laterality: N/A;   ESOPHAGOGASTRODUODENOSCOPY (EGD) WITH PROPOFOL  N/A 12/17/2023   Procedure: ESOPHAGOGASTRODUODENOSCOPY (EGD) WITH PROPOFOL ;  Surgeon: Maryruth Ole DASEN, MD;  Location: [facility] END[facility];  Service: Endoscopy;  Laterality: N/A;   FRACTURE SURGERY Left 1984   intramedullary rod   hip bone transplant  1984   LEG SURGERY Left    oseotomy   POLYPECTOMY  12/17/2023   Procedure: POLYPECTOMY, INTESTINE;  Surgeon: Maryruth Ole DASEN, MD;  Location: [facility] END[facility];  Service: Endoscopy;;   REPAIR ANKLE LIGAMENT Right 1978   REPAIR KNEE LIGAMENT Bilateral    acl   TRIGGER FINGER RELEASE Bilateral    thumbs    Allergies: Allergies as  of 04/10/2024 - Review Complete 03/26/2024  Allergen Reaction Noted   Lisinopril  Other (See Comments) 01/16/2024   Propranolol  Other (See Comments) 01/16/2024    Medications:  Current Facility-Administered Medications:    ceFAZolin  (ANCEF ) IVPB 2g/100 mL premix, 2 g, Intravenous, Once, Clois Fret, MD   6 CHG cloth bath night before surgery, , , Once **AND** [START ON 07/30/2024] 6 CHG cloth bath AM of surgery, , , Once **AND** Chlorhexidine  Gluconate Cloth 2 % PADS 6 each, 6 each,  Topical, Once **AND** Chlorhexidine  Gluconate Cloth 2 % PADS 6 each, 6 each, Topical, Once, Brown, Fallon E, NP   lactated ringers  infusion, , Intravenous, Continuous, Vicci Camellia Glatter, MD   vancomycin  (VANCOCIN ) IVPB 1000 mg/200 mL premix, 1,000 mg, Intravenous, Once, Clois Fret, MD  Social History: Social History   Tobacco Use   Smoking status: Never   Smokeless tobacco: Never  Vaping Use   Vaping status: Never Used  Substance Use Topics   Alcohol use: Not Currently    Comment: 1 beer per month   Drug use: No    Family Medical History: Family History  Problem Relation Age of Onset   Breast cancer Mother    Thyroid  cancer Mother    Lymphoma Father    CVA Father    Heart disease Sister        Dysautonomia   Hypertension Sister    Heart attack Maternal Grandfather     Physical Examination: Vitals:   07/29/24 0638  BP: (!) 143/90  Pulse: 80  Resp: 17  Temp: (!) 97.5 F (36.4 C)  SpO2: 98%   Heart sounds normal no MRG. Chest Clear to Auscultation Bilaterally.   General: Patient is in no apparent distress. Attention to examination is appropriate.  Neck:   Supple.  Full range of motion.  Respiratory: Patient is breathing without any difficulty.   NEUROLOGICAL:   Motor Exam: Significant wasting noted in the right greater than left intrinsic hand muscles.    He has 4 to 4+ out of 5 right sided grip and interosseous strength.  He has 5 out of 5 elsewhere in his upper extremities.  Side Iliopsoas Quads Hamstring PF DF EHL  R 5 5 5  4- 3 4+  L 5 5 5  4- 3 2      Medical Decision Making MRI L spine 02/29/2024 IMPRESSION: 1. Chronic L5 spondylolysis with grade 1 spondylolisthesis and severe bilateral L5 neural foraminal stenosis is stable from a 2023 MRI.   2. Multifactorial chronic but increased spinal and lateral recess stenosis at L1-L2 (moderate to severe greater on the right), L2-L3 (moderate to severe and greater on the left), and L3-L4  (moderate and greater on the left). And focal progression of a right lateral recess small disc extrusion at L1-L2 with associated degenerative endplate marrow edema there. Query right L2 radiculitis.     Electronically Signed   By: VEAR Hurst M.D.   On: 03/02/2024 12:07   Electrodiagnostics: His most recent upper extremity EMG is documented in the patient's chart, in summary this was a severe ongoing neuropathy at the ulnar nerve on the right with no evidence of active radiculopathy.  He also has lower extremity EMG concerning for active L5 denervation  I have personally reviewed the images and electrodiagnostics and agree with the above interpretation.  Assessment and Plan: [name] is a pleasant 63 y.o. male with a history of cervical myeloradiculopathy and progressive ulnar neuropathy.  He sees my partner for this.  I  agree with my partner that ulnar decompression is indicated.    He also has EMG evidence of active L5 denervation with weakness of L5 mediated muscles.  He does not have classic radicular symptoms, but clearly has severe L5 compression due to his anterolisthesis secondary to pars defects at L5.  We will proceed with L5-S1 anterior lumbar interbody fusion with percutaneous fixation.  I am not certain this will stop his atrophy, but I think that is reasonable.  Drs. Dew and Schnier will act as co-surgeons for approach on the anterior portion.   Reeves Daisy MD Neurosurgery

## 2024-07-29 NOTE — Anesthesia Preprocedure Evaluation (Signed)
 Anesthesia Evaluation  Patient identified by MRN, date of birth, ID band Patient awake    Reviewed: Allergy & Precautions, NPO status , Patient's Chart, lab work & pertinent test results  History of Anesthesia Complications Negative for: history of anesthetic complications  Airway Mallampati: III  TM Distance: <3 FB Neck ROM: full    Dental  (+) Chipped   Pulmonary neg shortness of breath, sleep apnea    Pulmonary exam normal        Cardiovascular Exercise Tolerance: Good hypertension, (-) angina (-) Past MI Normal cardiovascular exam     Neuro/Psych  Headaches  Neuromuscular disease CVA  negative psych ROS   GI/Hepatic Neg liver ROS,GERD  Controlled,,  Endo/Other  negative endocrine ROS    Renal/GU      Musculoskeletal   Abdominal   Peds  Hematology negative hematology ROS (+)   Anesthesia Other Findings Past Medical History: No date: ADHD No date: Anxiety     Comment:  a.) on BZO (lorazepam ) PRN No date: Arthritis No date: Concussion 12/2020: COVID No date: GERD (gastroesophageal reflux disease) No date: Hypertension No date: Long-term use of aspirin  therapy No date: Multiple lacunar infarcts (HCC) No date: MVP (mitral valve prolapse) No date: Pernicious anemia No date: [facility] spotted fever No date: Sleep apnea No date: Spinal stenosis No date: Stroke [facility]) No date: Vestibular migraine No date: Vitamin B12 deficiency No date: Vitamin D  deficiency  Past Surgical History: 07/30/2022: ANTERIOR CERVICAL DECOMP/DISCECTOMY FUSION; N/A     Comment:  Procedure: C4-7 ANTERIOR CERVICAL DISCECTOMY AND FUSION               (GLOBUS HEDRON);  Surgeon: Clois Fret, MD;                Location: [facility];  Service: Neurosurgery;  Laterality:               N/A; No date: BILATERAL CARPAL TUNNEL RELEASE 08/01/2022: CERVICAL WOUND DEBRIDEMENT; N/A     Comment:  Procedure: CERVICAL WOUND DEBRIDEMENT OF  HEMATOMA;                Surgeon: Clois Fret, MD;  Location: [facility];                Service: Neurosurgery;  Laterality: N/A; No date: COLONOSCOPY 12/17/2023: COLONOSCOPY WITH PROPOFOL ; N/A     Comment:  Procedure: COLONOSCOPY WITH PROPOFOL ;  Surgeon:               Maryruth Ole DASEN, MD;  Location: [facility];                Service: Endoscopy;  Laterality: N/A; 12/17/2023: ESOPHAGOGASTRODUODENOSCOPY (EGD) WITH PROPOFOL ; N/A     Comment:  Procedure: ESOPHAGOGASTRODUODENOSCOPY (EGD) WITH               PROPOFOL ;  Surgeon: Maryruth Ole DASEN, MD;  Location:               [facility];  Service: Endoscopy;  Laterality: N/A; 1984: FRACTURE SURGERY; Left     Comment:  intramedullary rod 1984: hip bone transplant No date: LEG SURGERY; Left     Comment:  oseotomy 12/17/2023: POLYPECTOMY     Comment:  Procedure: POLYPECTOMY, INTESTINE;  Surgeon: Maryruth Ole DASEN, MD;  Location: [facility];  Service:               Endoscopy;; 1978: REPAIR ANKLE  LIGAMENT; Right No date: REPAIR KNEE LIGAMENT; Bilateral     Comment:  acl No date: TRIGGER FINGER RELEASE; Bilateral     Comment:  thumbs  BMI    Body Mass Index: 21.14 kg/m      Reproductive/Obstetrics negative OB ROS                              Anesthesia Physical Anesthesia Plan  ASA: 3  Anesthesia Plan: General ETT   Post-op Pain Management:    Induction: Intravenous  PONV Risk Score and Plan: Ondansetron , Dexamethasone , Midazolam  and Treatment may vary due to age or medical condition  Airway Management Planned: Oral ETT and Video Laryngoscope Planned  Additional Equipment:   Intra-op Plan:   Post-operative Plan: Extubation in OR  Informed Consent: I have reviewed the patients History and Physical, chart, labs and discussed the procedure including the risks, benefits and alternatives for the proposed anesthesia with the patient or authorized representative who has  indicated his/her understanding and acceptance.     Dental Advisory Given  Plan Discussed with: Anesthesiologist, CRNA and Surgeon  Anesthesia Plan Comments: (Patient consented for risks of anesthesia including but not limited to:  - adverse reactions to medications - damage to eyes, teeth, lips or other oral mucosa - nerve damage due to positioning  - sore throat or hoarseness - Damage to heart, brain, nerves, lungs, other parts of body or loss of life  Patient voiced understanding and assent.)        Anesthesia Quick Evaluation

## 2024-07-29 NOTE — Op Note (Signed)
 [facility]     OPERATIVE NOTE   DATE: 07/29/2024   PRE-OPERATIVE DIAGNOSIS: Lumbar adjacent segment disease with pars defect with spondylolisthesis of L5-S1.     POST-OPERATIVE DIAGNOSIS: same as above   PROCEDURE: 1.   Anterior lumbar interbody fusion exposure via left retroperitoneal approach at L5-S1   COSURGEONS: Selinda Gu MD, Cordella Shawl MD   ASSISTANT(S): none   ANESTHESIA: general   ESTIMATED BLOOD LOSS: 200 cc   FINDING(S): 1.  none     INDICATIONS:   Patient presents with need for an anterior spinal L5-S1 interbody fusion.  We are performing the exposure for neurosurgery.  Risks and benefits were discussed and informed consent was obtained.   DESCRIPTION: After obtaining full informed written consent, the patient was brought back to the operating room and placed supine upon the operating table.  The patient was prepped and draped in the standard fashion.  Cosurgeons are used due to the complex nature of the exposure and the adjacent large vascular structures. A left paramedian incision was created in the lower abdomen.  Care was taken to stay between the rectus and the obliques.  The fascia was divided in this location.  The peritoneum was retracted medially.  We continued to dissect down to exposing the external iliac artery and vein.  The exposure had to be carried superiorly up to the common iliac artery and vein.  Ureter was identified and protected from harm.  Several small venous branches were ligated and divided between silk ties as needed.  We identified the internal iliac artery and vein as well.  We continued the dissection to the proximal common iliac artery and vein and tediously retracted the artery and vein medially.  This exposed the L5-S1 interbody.  The Omnitract retractor was used as were renal vein retractors and the L5-S1 interbody was exposed to an adequate fashion for fusion by Dr. Clois.  After Dr. Clois completed his  portion of the procedure, we proceeded with closure.  The deep fascia was closed with a running layer of 0 Vicryl.  A more superficial layer was then closed with a running layer of 2-0 Vicryl in the subcutaneous tissue was closed with 3-0 Vicryl.  Skin was coapted with staples.  Sterile dressing was placed. At this point, we turned the case back over to Dr. Katrina for the posterior portion of the procedure.   COMPLICATIONS: None   CONDITION: Stable   Selinda Gu   06/15/2020, 3:29 PM       This note was created with Dragon Medical transcription system. Any errors in dictation are purely unintentional.

## 2024-07-29 NOTE — Anesthesia Procedure Notes (Signed)
 Procedure Name: Intubation Date/Time: 07/29/2024 7:37 AM  Performed by: Kaitlynn Tramontana, CRNAPre-anesthesia Checklist: Patient identified, Emergency Drugs available, Suction available and Patient being monitored Patient Re-evaluated:Patient Re-evaluated prior to induction Oxygen Delivery Method: Circle System Utilized Preoxygenation: Pre-oxygenation with 100% oxygen Induction Type: IV induction Ventilation: Mask ventilation without difficulty and Oral airway inserted - appropriate to patient size Laryngoscope Size: McGrath and 4 Grade View: Grade I Tube type: Oral Tube size: 7.0 mm Number of attempts: 1 Airway Equipment and Method: Stylet and Oral airway Placement Confirmation: ETT inserted through vocal cords under direct vision, positive ETCO2 and breath sounds checked- equal and bilateral Secured at: 22 cm Tube secured with: Tape Dental Injury: Teeth and Oropharynx as per pre-operative assessment  Comments: Easy, atraumatic intubation, head and neck midline, lips, tongue and teeth unchanged.

## 2024-07-29 NOTE — Op Note (Signed)
 [facility]     OPERATIVE NOTE   DATE: 07/29/2024   PRE-OPERATIVE DIAGNOSIS: Pars defect with spondylolisthesis L5-S1   POST-OPERATIVE DIAGNOSIS: same as above   PROCEDURE: 1.   Anterior lumbar interbody fusion exposure via left retroperitoneal approach at L5-S1   COSURGEONS: Selinda Gu MD, Cordella Shawl MD   ASSISTANT(S): none   ANESTHESIA: general   ESTIMATED BLOOD LOSS: 200 cc   FINDING(S): 1.  none     INDICATIONS:   Patient presents with need for an anterior spinal L5-S1 interbody fusion.  We are performing the exposure for neurosurgery.  Risks and benefits were discussed and informed consent was obtained.   DESCRIPTION: After obtaining full informed written consent, the patient was brought back to the operating room and placed supine upon the operating table.  The patient was prepped and draped in the standard fashion.  Cosurgeons are used due to the complex nature of the exposure and the adjacent large vascular structures. A left paramedian incision was created in the lower abdomen.  Care was taken to stay between the rectus and the obliques.  The fascia was divided in this location.  The peritoneum was retracted medially.  We continued to dissect down to exposing the external iliac artery and vein as well as the internal iliac artery and vein and the most distal common iliac vessels.  The exposure had to be carried superiorly up to the common iliac artery and vein.  Ureter was identified and protected from harm.  Several small venous branches were ligated and divided between silk ties as needed. These were fairly numerous and many branches were take between silk ties or 5-0 Prolene suture ligatures  We identified the internal iliac artery and vein as well.  We continued the dissection to the proximal common iliac artery and vein and tediously retracted the artery and vein medially.  This exposed the L5-S1 interbody.  The Omnitract retractor was used as  were renal vein retractors and the L5-S1 interbody was exposed to an adequate fashion for fusion by Dr. Clois. After Dr. Clois completed his portion of the procedure, we proceeded with closure.  The deep fascia was closed with a running layer of 0 Vicryl.  A more superficial layer was then closed with a running layer of 2-0 Vicryl in the subcutaneous tissue was closed with 3-0 Vicryl.  Skin was coapted with staples.  Sterile dressing was placed. At this point, we turned the case back over to Dr. Katrina for the posterior portion of the procedure.   COMPLICATIONS: None   CONDITION: Stable   Selinda Gu   07/29/2024       This note was created with Dragon Medical transcription system. Any errors in dictation are purely unintentional.

## 2024-07-29 NOTE — Op Note (Signed)
 Indications: [name] is a 63 y.o. male with M43.10 Pars defect with spondylolisthesis, M54.16 Lumbar radiculopathy, M62.561, M62.562 Atrophy of muscle of both lower legs   Findings: successful ALIF  Preoperative Diagnosis: M43.10 Pars defect with spondylolisthesis, M54.16 Lumbar radiculopathy, M62.561, M62.562 Atrophy of muscle of both lower legs  Postoperative Diagnosis: same   EBL: 500 ml IVF: see AR, 300 ml cellsaver returned to patient Drains: none Disposition: Extubated and Stable to PACU Complications: none  A foley catheter was placed.   Preoperative Note:   Risks of surgery discussed include: infection, bleeding, stroke, coma, death, paralysis, CSF leak, nerve/spinal cord injury, numbness, tingling, weakness, complex regional pain syndrome, recurrent stenosis and/or disc herniation, vascular injury, development of instability, neck/back pain, need for further surgery, persistent symptoms, development of deformity, and the risks of anesthesia. The patient understood these risks and agreed to proceed.  Please note that Drs. Dew and Schnier acted as co-surgeons for access to the anterior lumbar spine.  They will dictate a separate note.   NAME OF ANTERIOR PROCEDURE:               1. Anterior lumbar interbody fusion via a retroperitoneal approach at L5/S1 2. Placement of a Lordotic Globus Hedron interbody cage, filled with Demineralized Bone Matrix  NAME OF POSTERIOR PROCEDURE: 1. Posterior instrumentation using Nuvasive Reline Instrumentation 2. Posterolateral fusion, L5/S1  3. Use of stereotaxis ([facility])   PROCEDURE:  Patient was brought to the operating room, intubated.  All pressure points were checked and double-checked.  The patient was prepped and draped in the standard fashion for anterior exposure.   Drs. Dew and Schnier scrubbed in for exposure.  After they had adequately exposed the operative site, I scrubbed in and confirmed localization.  We then  proceeded to perform discectomy at L5/S1. I utilized a combination of rongeurs, rasp, curettes, and Kerrison punches to remove the majority of the disc.  We then introduced the trials and sized up to an appropriate size with good grip.  The trial placement caused reduction of the anterolisthesis, so intraoperative decision was made to place a Hedron cage. The endplates were prepared for arthrodesis.  We then introduced the Globus Hedron interbody device, filled with allograft.  This was secured into position using screws at L5 and S1.  The final tightener was engaged.  Final radiographs were taken to confirm adequate placement of the graft.  Vascular surgery then scrubbed back in for closure.    After closing the anterior part in layers, the patient was repositioned into prone position.  All pressure points were checked and double-checked.  The posterior operative site was prepped and draped in standard fashion.  The stereotactic array was placed.  Stereotactic images were acquired using intraoperative CT scanning.  This was registered to the patient.  Using stereotaxis, screw trajectories were planned and incisions made.  The pedicles from L5 to LS1 were cannulated bilaterally and K wires used to secure the tracks.  We then utilized a stereotactic screwdriver to place pedicle screws from L5 to S1.  At each level, Nuvasive Reline pedicle screws were placed.  Once the screws were placed, the screw extensions were then linked, a path was formed for the rod and a rod was utilized to connect the screws.  We then compressed, torqued / counter-torqued and removed the screw assembly. Once performed on each side, the C-arm was brought back and to take confirmatory CT scan showing appropriate placement of all instrumentation and anatomic alignment.  Posterolateral arthrodesis was performed at L5-S1.  Again we confirmed radiographically and began our closure.  The wound was closed using 0 Vicryl interrupted  suture in the fascia, 2-0 Vicryl inverted suture were placed in the subcutaneous tissue and dermis. 3-0 monocryl was used for final closure. Dermabond was used to close the skin.    Needle, lap and all counts were correct at the end of the case.     Edsel Goods PA assisted in the entire procedure. An assistant was required for this procedure due to the complexity.  The assistant provided assistance in tissue manipulation and suction, and was required for the successful and safe performance of the procedure. I performed the critical portions of the procedure.   Reeves Daisy MD Neurosurgery

## 2024-07-30 ENCOUNTER — Ambulatory Visit

## 2024-07-30 ENCOUNTER — Encounter: Admitting: Occupational Therapy

## 2024-07-30 ENCOUNTER — Encounter: Payer: Self-pay | Admitting: Vascular Surgery

## 2024-07-30 MED ORDER — CHLORPHENIRAMINE-DM 4-30 MG PO TABS
1.0000 | ORAL_TABLET | Freq: Four times a day (QID) | ORAL | Status: DC
  Administered 2024-07-30 – 2024-08-03 (×15): 1 via ORAL
  Filled 2024-07-30 (×16): qty 1

## 2024-07-30 MED ORDER — DM-APAP-CPM 10-325-2 MG PO TABS: 1.0000 | ORAL_TABLET | Freq: Four times a day (QID) | ORAL | Status: DC

## 2024-07-30 MED ORDER — LORAZEPAM 0.5 MG PO TABS: 0.5000 mg | ORAL_TABLET | Freq: Two times a day (BID) | ORAL | Status: DC | PRN

## 2024-07-30 NOTE — Progress Notes (Signed)
 Patient requested to remove his foley catheter later this morning. Will pass to day shift.

## 2024-07-30 NOTE — Plan of Care (Signed)
  Problem: Education: Goal: Knowledge of General Education information will improve Description: Including pain rating scale, medication(s)/side effects and non-pharmacologic comfort measures Outcome: Progressing   Problem: Clinical Measurements: Goal: Diagnostic test results will improve Outcome: Progressing   Problem: Coping: Goal: Level of anxiety will decrease Outcome: Progressing   Problem: Pain Managment: Goal: General experience of comfort will improve and/or be controlled Outcome: Progressing   Problem: Safety: Goal: Ability to remain free from injury will improve Outcome: Progressing

## 2024-07-30 NOTE — Evaluation (Signed)
 Physical Therapy Evaluation Patient Details Name: [name] MRN: [id number] DOB: 01/01/1961 Today's Date: 07/30/2024  History of Present Illness  63 y.o s/p L5-S1 ALIF and PSF on 07/29/24. PMHx includes ADHD, anxiety, arthritis, COVD, GERD, HTN, multiple lacunar infarcts, MVP, rocky mountain spotted fever, sleep apnea, spinal stenosis, vestibular migraines, and ACDF 07/2022.  Clinical Impression  Patient received in bed, spouse at bedside. He is pleasant and agreeable to PT/OT assessments. Patient requires cues and min A to roll in bed and perform side lying to sit. Back brace donned in sitting. Patient reports no pain. He is min A for sit to stand and for ambulating 20 feet in room. Patient will continue to benefit from skilled PT to improve functional independence and safety with mobility.         If plan is discharge home, recommend the following: A little help with walking and/or transfers;A little help with bathing/dressing/bathroom;Assist for transportation;Help with stairs or ramp for entrance   Can travel by private vehicle    Yes with assist.    Equipment Recommendations None recommended by PT  Recommendations for Other Services       Functional Status Assessment Patient has had a recent decline in their functional status and demonstrates the ability to make significant improvements in function in a reasonable and predictable amount of time.     Precautions / Restrictions Precautions Precautions: Back;Fall Precaution Booklet Issued: Yes (comment) Recall of Precautions/Restrictions: Impaired Required Braces or Orthoses: Spinal Brace Spinal Brace: Lumbar corset;Applied in sitting position Restrictions Weight Bearing Restrictions Per Provider Order: No      Mobility  Bed Mobility Overal bed mobility: Needs Assistance Bed Mobility: Rolling, Sidelying to Sit Rolling: Min assist, Used rails Sidelying to sit: Min assist, Used rails             Transfers Overall transfer level: Needs assistance Equipment used: Rolling walker (2 wheels) Transfers: Sit to/from Stand Sit to Stand: Contact guard assist                Ambulation/Gait Ambulation/Gait assistance: Contact guard assist, Min assist Gait Distance (Feet): 20 Feet Assistive device: Rolling walker (2 wheels) Gait Pattern/deviations: Step-through pattern, Decreased step length - right, Decreased step length - left, Decreased stride length Gait velocity: decr     General Gait Details: steady with RW  Stairs            Wheelchair Mobility     Tilt Bed    Modified [name] (Stroke Patients Only)       Balance Overall balance assessment: Needs assistance Sitting-balance support: Feet supported Sitting balance-Leahy Scale: Good     Standing balance support: Bilateral upper extremity supported, During functional activity, Reliant on assistive device for balance Standing balance-Leahy Scale: Fair                               Pertinent Vitals/Pain Pain Assessment Pain Assessment: No/denies pain    Home Living Family/patient expects to be discharged to:: Private residence Living Arrangements: Spouse/significant other Available Help at Discharge: Family;Available 24 hours/day Type of Home: House Home Access: Stairs to enter Entrance Stairs-Rails: Doctor, [employer] of Steps: 4   Home Layout: Able to live on main level with bedroom/bathroom;Two level Home Equipment: Agricultural Consultant (2 wheels);Rollator (4 wheels)      Prior Function Prior Level of Function : Independent/Modified Independent             Mobility  Comments: uses walker at baseline due to weakness and instability in LEs (chronic) ADLs Comments: independent     Extremity/Trunk Assessment   Upper Extremity Assessment Upper Extremity Assessment: Defer to OT evaluation    Lower Extremity Assessment Lower Extremity Assessment: Generalized  weakness    Cervical / Trunk Assessment Cervical / Trunk Assessment: Back Surgery  Communication   Communication Communication: No apparent difficulties    Cognition Arousal: Alert Behavior During Therapy: WFL for tasks assessed/performed   PT - Cognitive impairments: Problem solving, Sequencing, Safety/Judgement                         Following commands: Intact       Cueing Cueing Techniques: Verbal cues     General Comments      Exercises     Assessment/Plan    PT Assessment Patient needs continued PT services  PT Problem List Decreased strength;Decreased activity tolerance;Decreased balance;Decreased mobility;Decreased knowledge of use of DME;Decreased safety awareness;Decreased knowledge of precautions;Decreased skin integrity       PT Treatment Interventions DME instruction;Gait training;Stair training;Functional mobility training;Therapeutic activities;Therapeutic exercise;Balance training;Neuromuscular re-education;Patient/family education    PT Goals (Current goals can be found in the Care Plan section)  Acute Rehab PT Goals Patient Stated Goal: improve, return home PT Goal Formulation: With patient/family Time For Goal Achievement: 08/13/24 Potential to Achieve Goals: Good    Frequency Min 4X/week     Co-evaluation PT/OT/SLP Co-Evaluation/Treatment: Yes Reason for Co-Treatment: For patient/therapist safety;To address functional/ADL transfers PT goals addressed during session: Mobility/safety with mobility;Balance;Proper use of DME OT goals addressed during session: ADL's and self-care;Proper use of Adaptive equipment and DME       AM-PAC PT 6 Clicks Mobility  Outcome Measure Help needed turning from your back to your side while in a flat bed without using bedrails?: A Little Help needed moving from lying on your back to sitting on the side of a flat bed without using bedrails?: A Little Help needed moving to and from a bed to a chair  (including a wheelchair)?: A Little Help needed standing up from a chair using your arms (e.g., wheelchair or bedside chair)?: A Little Help needed to walk in hospital room?: A Little Help needed climbing 3-5 steps with a railing? : A Lot 6 Click Score: 17    End of Session Equipment Utilized During Treatment: Gait belt Activity Tolerance: Patient tolerated treatment well Patient left: in chair;with call bell/phone within reach;with chair alarm set Nurse Communication: Mobility status PT Visit Diagnosis: Unsteadiness on feet (R26.81);Other abnormalities of gait and mobility (R26.89);Difficulty in walking, not elsewhere classified (R26.2);Muscle weakness (generalized) (M62.81)    Time: 9155-9081 PT Time Calculation (min) (ACUTE ONLY): 34 min   Charges:   PT Evaluation $PT Eval Moderate Complexity: 1 Mod PT Treatments $Gait Training: 8-22 mins PT General Charges $$ ACUTE PT VISIT: 1 Visit        Fernando Stoiber, PT, GCS 07/30/2024,10:12 AM

## 2024-07-30 NOTE — Evaluation (Signed)
 Occupational Therapy Evaluation Patient Details Name: [name] MRN: [id number] DOB: 05-02-1961 Today's Date: 07/30/2024   History of Present Illness   63 y.o s/p L5-S1 ALIF and PSF on 07/29/24. PMHx includes ADHD, anxiety, arthritis, COVD, GERD, HTN, multiple lacunar infarcts, MVP, rocky mountain spotted fever, sleep apnea, spinal stenosis, vestibular migraines, and ACDF 07/2022.    Clinical Impressions Pt seen for OT evaluation this date, POD#1. Prior to hospital admission, pt was mod independent with mobility using crutches, ADL, and IADL. Pt lives with spouse in a single family home and spouse is able to provide 24/7 assist/support as needed for pt. Pt required MIN A and VC for log roll for bed mobility, CGA for STS with RW and VC for hand placement, CGA for in-room mobility with RW demonstrating fair- dynamic balance, and requires MIN A for LB ADL tasks. Pt/spouse instructed in back precautions and how to maintain during ADL/mobility, AE/DME, brace mgt, falls prevention, and activity pacing. Handout provided. Pt/spouse verbalized understanding of all education/training provided. Handout provided to support recall and carry over of learned precautions/techniques for bed mobility, functional transfers, and self care skills. Pt will benefit from additional skilled OT services to maximize return to PLOF.     If plan is discharge home, recommend the following:   A little help with walking and/or transfers;A lot of help with bathing/dressing/bathroom;Assistance with cooking/housework;Assist for transportation;Help with stairs or ramp for entrance     Functional Status Assessment   Patient has had a recent decline in their functional status and demonstrates the ability to make significant improvements in function in a reasonable and predictable amount of time.     Equipment Recommendations   None recommended by OT     Recommendations for Other Services          Precautions/Restrictions   Precautions Precautions: Fall;Back Precaution Booklet Issued: Yes (comment) Recall of Precautions/Restrictions: Impaired Required Braces or Orthoses: Spinal Brace Spinal Brace: Lumbar corset;Applied in sitting position Restrictions Weight Bearing Restrictions Per Provider Order: No     Mobility Bed Mobility Overal bed mobility: Needs Assistance Bed Mobility: Rolling, Sidelying to Sit Rolling: Min assist, Used rails Sidelying to sit: Min assist, Used rails       General bed mobility comments: VC sequencing    Transfers Overall transfer level: Needs assistance Equipment used: Rolling walker (2 wheels) Transfers: Sit to/from Stand Sit to Stand: Contact guard assist                  Balance Overall balance assessment: Needs assistance Sitting-balance support: Feet supported Sitting balance-Leahy Scale: Good     Standing balance support: Bilateral upper extremity supported, During functional activity, Reliant on assistive device for balance Standing balance-Leahy Scale: Fair                             ADL either performed or assessed with clinical judgement   ADL Overall ADL's : Needs assistance/impaired         Upper Body Bathing: Sitting;Minimal assistance Upper Body Bathing Details (indicate cue type and reason): anticipated Lower Body Bathing: Sit to/from stand;Minimal assistance;Moderate assistance;Sitting/lateral leans;With caregiver independent assisting Lower Body Bathing Details (indicate cue type and reason): anticipated         Toilet Transfer: Contact guard assist;BSC/3in1;Rolling walker (2 wheels) Toilet Transfer Details (indicate cue type and reason): simulated Toileting- Clothing Manipulation and Hygiene: Minimal assistance;Sit to/from stand Toileting - Clothing Manipulation Details (indicate cue type and  reason): simulated             Vision         Perception         Praxis          Pertinent Vitals/Pain Pain Assessment Pain Assessment: No/denies pain     Extremity/Trunk Assessment Upper Extremity Assessment Upper Extremity Assessment: Generalized weakness   Lower Extremity Assessment Lower Extremity Assessment: Generalized weakness   Cervical / Trunk Assessment Cervical / Trunk Assessment: Back Surgery   Communication Communication Communication: No apparent difficulties   Cognition Arousal: Alert Behavior During Therapy: WFL for tasks assessed/performed                                 Following commands: Intact       Cueing  General Comments   Cueing Techniques: Verbal cues      Exercises Other Exercises Other Exercises: Pt/spouse instructed in back precautions and how to maintain during ADL/mobility, AE/DME, brace mgt, falls prevention, and activity pacing. Handout provided.   Shoulder Instructions      Home Living Family/patient expects to be discharged to:: Private residence Living Arrangements: Spouse/significant other Available Help at Discharge: Family;Available 24 hours/day Type of Home: House Home Access: Stairs to enter [employer] of Steps: 4 Entrance Stairs-Rails: Right;Left Home Layout: Able to live on main level with bedroom/bathroom;Two level     Bathroom Shower/Tub: Producer, Television/film/video: Standard     Home Equipment: Agricultural Consultant (2 wheels);Rollator (4 wheels);Shower seat;BSC/3in1;Hand held shower head          Prior Functioning/Environment Prior Level of Function : Independent/Modified Independent             Mobility Comments: uses walker at baseline due to weakness and instability in LEs (chronic) ADLs Comments: independent    OT Problem List: Decreased strength;Pain;Decreased coordination;Decreased range of motion;Decreased activity tolerance;Decreased knowledge of use of DME or AE;Impaired balance (sitting and/or standing);Impaired UE functional use;Decreased  knowledge of precautions   OT Treatment/Interventions: Self-care/ADL training;Therapeutic exercise;Therapeutic activities;DME and/or AE instruction;Patient/family education;Balance training      OT Goals(Current goals can be found in the care plan section)   Acute Rehab OT Goals Patient Stated Goal: get better OT Goal Formulation: With patient/family Time For Goal Achievement: 08/13/24 Potential to Achieve Goals: Good ADL Goals Pt Will Perform Lower Body Dressing: sit to/from stand;with caregiver independent in assisting;with adaptive equipment;with modified independence (maintaining back precautions) Pt Will Transfer to Toilet: with supervision;ambulating;regular height toilet;bedside commode (LRAD) Pt Will Perform Toileting - Clothing Manipulation and hygiene: with modified independence;with adaptive equipment;sitting/lateral leans;sit to/from stand Additional ADL Goal #1: Pt/spouse will be mod indep with back brace mgt.   OT Frequency:  Min 2X/week    Co-evaluation PT/OT/SLP Co-Evaluation/Treatment: Yes Reason for Co-Treatment: For patient/therapist safety;To address functional/ADL transfers PT goals addressed during session: Mobility/safety with mobility;Balance;Proper use of DME OT goals addressed during session: ADL's and self-care;Proper use of Adaptive equipment and DME      AM-PAC OT 6 Clicks Daily Activity     Outcome Measure Help from another person eating meals?: None Help from another person taking care of personal grooming?: A Little Help from another person toileting, which includes using toliet, bedpan, or urinal?: A Little Help from another person bathing (including washing, rinsing, drying)?: A Lot Help from another person to put on and taking off regular upper body clothing?: A Little Help from another person  to put on and taking off regular lower body clothing?: A Lot 6 Click Score: 17   End of Session Equipment Utilized During Treatment: Rolling walker (2  wheels)  Activity Tolerance: Patient tolerated treatment well Patient left: in chair;with call bell/phone within reach;with chair alarm set;with family/visitor present  OT Visit Diagnosis: Other abnormalities of gait and mobility (R26.89);Muscle weakness (generalized) (M62.81)                Time: 9155-9081 OT Time Calculation (min): 34 min Charges:  OT General Charges $OT Visit: 1 Visit OT Evaluation $OT Eval Moderate Complexity: 1 Mod OT Treatments $Self Care/Home Management : 8-22 mins  [name] SAUNDERS., MPH, MS, OTR/L ascom [phone] 07/30/2024, 12:00 PM

## 2024-07-30 NOTE — Progress Notes (Signed)
° °  Neurosurgery Progress Note  History: [name] is a 63 y.o s/p L5-S1 ALIF and PSF  POD1: Pt doing well this morning with expected abdominal pain. He has started to pass flatus.   Physical Exam: Vitals:   07/29/24 2332 07/30/24 0404  BP: 131/78 111/68  Pulse: 97 87  Resp: 17 17  Temp: 98.3 F (36.8 C) 98.3 F (36.8 C)  SpO2: 100% 100%    AA Ox3 CNI  Side Iliopsoas Quads Hamstring PF DF EHL  R 5 5 5  4- 3 4+  L 5 5 5  4- 3 2   Incisions: c/d/I with dressings in place   Data:  Other tests/results: NA  Assessment/Plan:  [name] is a 63 y.o presenting with lumbar spondylolisthesis s/p L5-S1 ALIF and PSF  - Diet: will advance to full liquid diet this morning - continue BM regimen. Pt will need to have a BM prior to discharge - foley to be remove later this morning  - cough: Ordered home med to be sent to pharmacy to verify.  - mobilize - pain control - DVT prophylaxis - PTOT; dispo planning underway. Brace to be worn when out of bed and ambulating with PT for comfort   Edsel Goods PA-C Department of Neurosurgery

## 2024-07-30 NOTE — Plan of Care (Signed)
°  Problem: Bladder/Genitourinary: Goal: Urinary functional status for postoperative course will improve Outcome: Progressing   Problem: Skin Integrity: Goal: Will show signs of wound healing Outcome: Progressing   Problem: Pain Management: Goal: Pain level will decrease Outcome: Progressing   Problem: Bowel/Gastric: Goal: Gastrointestinal status for postoperative course will improve Outcome: Progressing

## 2024-07-30 NOTE — TOC Progression Note (Signed)
 Transition of Care [facility]) - Progression Note    Patient Details  Name: [name] MRN: [id number] Date of Birth: October 27, 1960  [facility] CM/SW Contact  Alvaro Louder, [place] Phone Number: 07/30/2024, 1:39 PM  Clinical Narrative:     Patient has selected HH company [facility] to follow at discharge.   TOC to follow for discharge                     Expected Discharge Plan and Services                                               Social Drivers of Health (SDOH) Interventions SDOH Screenings   Food Insecurity: No Food Insecurity (07/29/2024)  Housing: Low Risk (07/29/2024)  Transportation Needs: No Transportation Needs (07/29/2024)  Utilities: Not At Risk (07/29/2024)  Alcohol Screen: Low Risk (07/13/2024)  Depression (PHQ2-9): Low Risk (04/22/2024)  Financial Resource Strain: Low Risk (07/13/2024)  Physical Activity: Inactive (07/13/2024)  Social Connections: Moderately Isolated (07/29/2024)  Stress: No Stress Concern Present (02/12/2024)  Tobacco Use: Low Risk (07/29/2024)    Readmission Risk Interventions     No data to display

## 2024-07-31 ENCOUNTER — Telehealth: Payer: Self-pay | Admitting: Nurse Practitioner

## 2024-07-31 NOTE — Telephone Encounter (Unsigned)
 Copied from CRM #[id number]. Topic: General - Other >> Jul 31, 2024 10:07 AM Shanda MATSU wrote: Reason for CRM: Dorothe w/[facility] called in to confirm if the patient is a an established patient and if he has been seen recently, adv caller that patient is an established patient and that he was last seen on 07/13/2024, caller wanted to adv PCP that [facility] will be following the patient for his home health care once he is discharged which will be in about 1-2 days.

## 2024-07-31 NOTE — Telephone Encounter (Signed)
 FYI to provider

## 2024-07-31 NOTE — Progress Notes (Signed)
 Occupational Therapy Treatment Patient Details Name: [name] MRN: [id number] DOB: 1961-05-07 Today's Date: 07/31/2024   History of present illness 63 y.o s/p L5-S1 ALIF and PSF on 07/29/24. PMHx includes ADHD, anxiety, arthritis, COVD, GERD, HTN, multiple lacunar infarcts, MVP, rocky mountain spotted fever, sleep apnea, spinal stenosis, vestibular migraines, and ACDF 07/2022.   OT comments  Pt seen for OT tx. Pt just getting back to the bedroom from bathroom. Sat EOB, able to don back brace with PRN VC. Pt educated in AE for LB dressing and pt able to return demo with CGA to stand to don shorts over hips. Pt ambulated ~50' + ~50' with CGA and RW with intermittent VC to improve BOS. Pt stood at the sink to complete grooming tasks with intermittent UE support on the counter for stability. Pt returned to bed, further educated in log roll with MIN A required. Pt progressing towards goals.        If plan is discharge home, recommend the following:  A little help with walking and/or transfers;A lot of help with bathing/dressing/bathroom;Assistance with cooking/housework;Assist for transportation;Help with stairs or ramp for entrance   Equipment Recommendations  Other (comment) (reacher)    Recommendations for Other Services      Precautions / Restrictions Precautions Precautions: Back;Fall Precaution Booklet Issued: Yes (comment) Recall of Precautions/Restrictions: Intact Required Braces or Orthoses: Spinal Brace Spinal Brace: Applied in sitting position;Lumbar corset Restrictions Weight Bearing Restrictions Per Provider Order: No       Mobility Bed Mobility Overal bed mobility: Needs Assistance Bed Mobility: Sit to Sidelying, Rolling Rolling: Min assist       Sit to sidelying: Min assist General bed mobility comments: MIN A for BLE mgt, cues for sequencing    Transfers Overall transfer level: Needs assistance Equipment used: Rolling walker (2 wheels) Transfers:  Sit to/from Stand Sit to Stand: Contact guard assist, Supervision                 Balance Overall balance assessment: Needs assistance Sitting-balance support: Feet supported Sitting balance-Leahy Scale: Good     Standing balance support: Bilateral upper extremity supported, During functional activity, Reliant on assistive device for balance, Single extremity supported, No upper extremity supported Standing balance-Leahy Scale: Fair Standing balance comment: very narrow BOS when ambulating requiring intermittent VC                           ADL either performed or assessed with clinical judgement   ADL Overall ADL's : Needs assistance/impaired     Grooming: Standing;Supervision/safety;Oral care               Lower Body Dressing: Sit to/from stand;Cueing for safety;With adaptive equipment;Contact guard assist Lower Body Dressing Details (indicate cue type and reason): edu in reacher and pt able to return demo with good adherence to back precautions, intermittent VC             Functional mobility during ADLs: Supervision/safety;Contact guard assist;Cueing for sequencing;Cueing for safety;Rolling walker (2 wheels)      Extremity/Trunk Assessment              Vision       Perception     Praxis     Communication Communication Communication: No apparent difficulties   Cognition Arousal: Alert Behavior During Therapy: [facility] for tasks assessed/performed  Following commands: Intact        Cueing   Cueing Techniques: Verbal cues  Exercises Other Exercises Other Exercises: Pt/spouse edu in AE for LB ADL, compression stocking mgt, brace mgt    Shoulder Instructions       General Comments      Pertinent Vitals/ Pain       Pain Assessment Pain Assessment: 0-10 Pain Score: 2  Pain Location: back Pain Descriptors / Indicators: Aching Pain Intervention(s): Limited activity within patient's  tolerance, Monitored during session, Premedicated before session, Repositioned  Home Living                                          Prior Functioning/Environment              Frequency  Min 2X/week        Progress Toward Goals  OT Goals(current goals can now be found in the care plan section)  Progress towards OT goals: Progressing toward goals  Acute Rehab OT Goals Patient Stated Goal: get better OT Goal Formulation: With patient/family Time For Goal Achievement: 08/13/24 Potential to Achieve Goals: Good  Plan      Co-evaluation                 AM-PAC OT 6 Clicks Daily Activity     Outcome Measure   Help from another person eating meals?: None Help from another person taking care of personal grooming?: A Little Help from another person toileting, which includes using toliet, bedpan, or urinal?: A Little Help from another person bathing (including washing, rinsing, drying)?: A Lot Help from another person to put on and taking off regular upper body clothing?: A Little Help from another person to put on and taking off regular lower body clothing?: A Little 6 Click Score: 18    End of Session Equipment Utilized During Treatment: Rolling walker (2 wheels);Back brace  OT Visit Diagnosis: Other abnormalities of gait and mobility (R26.89);Muscle weakness (generalized) (M62.81)   Activity Tolerance Patient tolerated treatment well   Patient Left in bed;with call bell/phone within reach;with bed alarm set;with family/visitor present   Nurse Communication          Time: 8487-8448 OT Time Calculation (min): 39 min  Charges: OT General Charges $OT Visit: 1 Visit OT Treatments $Self Care/Home Management : 23-37 mins $Therapeutic Activity: 8-22 mins  [name] SAUNDERS., MPH, MS, OTR/L ascom [phone] 07/31/2024, 4:06 PM

## 2024-07-31 NOTE — Plan of Care (Signed)
  Problem: Education: Goal: Knowledge of General Education information will improve Description: Including pain rating scale, medication(s)/side effects and non-pharmacologic comfort measures Outcome: Progressing   Problem: Clinical Measurements: Goal: Diagnostic test results will improve Outcome: Progressing   Problem: Activity: Goal: Risk for activity intolerance will decrease Outcome: Progressing   Problem: Nutrition: Goal: Adequate nutrition will be maintained Outcome: Progressing   Problem: Coping: Goal: Level of anxiety will decrease Outcome: Progressing   Problem: Safety: Goal: Ability to remain free from injury will improve Outcome: Progressing

## 2024-07-31 NOTE — Progress Notes (Signed)
 Physical Therapy Treatment Patient Details Name: [name] MRN: [id number] DOB: [birth year] Today's Date: 07/31/2024   History of Present Illness 63 y.o s/p L5-S1 ALIF and PSF on 07/29/24. PMHx includes ADHD, anxiety, arthritis, COVD, GERD, HTN, multiple lacunar infarcts, MVP, rocky mountain spotted fever, sleep apnea, spinal stenosis, vestibular migraines, and ACDF 07/2022.    PT Comments  Pt was long sitting in bed with spouse present at bedside. He is alert and agreeable to session. Does have slow processing but remains cooperative and motivated throughout. Slightly impulsively progresses from long sitting to short sitting EOB. Encouraged to perform log roll technique to adhere to spinal precautions. Per spouse, He will be sleeping in recliner at DC. Pt was able to apply LSO while seated EOB without physical assistance. Stood and ambulated ~ 75 ft with RW without LOB or safety concern. Pushed pt the rest of the way to rehab gym in w/c to perform stairs to simulate home entry/exit. He performed ascending/descending (4 steps) stairs 2 x without difficulty. Both pt and spouse state confidence in safe abilities to perform. Returned to room and reposition pt in recliner post session. RN in room at conclusion of session. Dc recs remain appropriate to maximize independence and safety with all ADLs      If plan is discharge home, recommend the following: A little help with walking and/or transfers;A little help with bathing/dressing/bathroom;Assist for transportation;Help with stairs or ramp for entrance     Equipment Recommendations  None recommended by PT       Precautions / Restrictions Precautions Precautions: Back;Fall Precaution Booklet Issued: Yes (comment) Recall of Precautions/Restrictions: Intact Precaution/Restrictions Comments: spouse reviewed his spinal precautions Required Braces or Orthoses: Spinal Brace Spinal Brace: Applied in sitting position (LSO) Restrictions Weight  Bearing Restrictions Per Provider Order: No     Mobility  Bed Mobility Overal bed mobility: Needs Assistance Bed Mobility: Supine to Sit  Supine to sit: Supervision  General bed mobility comments: pt slightly impulsively gets OOB. did not perform log roll technique due to his impuslivity. per spouse,  he will sleep in a recliner for first few nights.    Transfers Overall transfer level: Needs assistance Equipment used: Rolling walker (2 wheels) Transfers: Sit to/from Stand Sit to Stand: Contact guard assist, Supervision  General transfer comment: CGA for safety however no physical assistance    Ambulation/Gait Ambulation/Gait assistance: Contact guard assist, Supervision Gait Distance (Feet): 75 Feet Assistive device: Rolling walker (2 wheels) Gait Pattern/deviations: Step-through pattern  General Gait Details: Pt ambulated ~ 75 ft with RW. LLE foot drop noted however has had this for years. No LOB but was encouraged to slow down.   Stairs Stairs: Yes Stairs assistance: Contact guard assist, Supervision Stair Management: One rail Left, Step to pattern, Forwards, Sideways Number of Stairs: 4 General stair comments: Pt performed ascending/descending stairs to simulate home entry. spouse and pt state understanding and confidence in safe abilities to access home   Balance Overall balance assessment: Needs assistance Sitting-balance support: Feet supported Sitting balance-Leahy Scale: Good     Standing balance support: Bilateral upper extremity supported, During functional activity, Reliant on assistive device for balance Standing balance-Leahy Scale: Fair Standing balance comment: no LOB. Issued gait belt for home use. encouraged pt to use RW all the time at home       Communication Communication Communication: No apparent difficulties  Cognition Arousal: Alert Behavior During Therapy: WFL for tasks assessed/performed   PT - Cognitive impairments: Safety/Judgement     PT -  Cognition Comments: slow processing however consistently follow commands throughout Following commands: Intact      Cueing Cueing Techniques: Verbal cues         Pertinent Vitals/Pain Pain Assessment Pain Assessment: No/denies pain     PT Goals (current goals can now be found in the care plan section) Acute Rehab PT Goals Patient Stated Goal: go home Progress towards PT goals: Progressing toward goals    Frequency    Min 4X/week           Co-evaluation     PT goals addressed during session: Mobility/safety with mobility;Balance;Proper use of DME;Strengthening/ROM        AM-PAC PT 6 Clicks Mobility   Outcome Measure  Help needed turning from your back to your side while in a flat bed without using bedrails?: A Little Help needed moving from lying on your back to sitting on the side of a flat bed without using bedrails?: A Little Help needed moving to and from a bed to a chair (including a wheelchair)?: A Little Help needed standing up from a chair using your arms (e.g., wheelchair or bedside chair)?: A Little Help needed to walk in hospital room?: A Little Help needed climbing 3-5 steps with a railing? : A Little 6 Click Score: 18    End of Session   Activity Tolerance: Patient tolerated treatment well Patient left: in chair;with call bell/phone within reach;with chair alarm set;with family/visitor present;with nursing/sitter in room Nurse Communication: Mobility status PT Visit Diagnosis: Unsteadiness on feet (R26.81);Other abnormalities of gait and mobility (R26.89);Difficulty in walking, not elsewhere classified (R26.2);Muscle weakness (generalized) (M62.81)     Time: 8859-8791 PT Time Calculation (min) (ACUTE ONLY): 28 min  Charges:    $Gait Training: 8-22 mins $Therapeutic Activity: 8-22 mins PT General Charges $$ ACUTE PT VISIT: 1 Visit                     [name] Essex PTA 07/31/2024, 12:44 PM

## 2024-07-31 NOTE — Progress Notes (Signed)
 PT Cancellation Note  Patient Details Name: [name] MRN: [id number] DOB: [birth year]   Cancelled Treatment:     PT attempt. Pt's spouse met author at the door and requested pt not be disturbed at this time. This is the first time he has slept in three days. Author will return later this date to progress OOB activity. Rn made aware to let PT know when he is awake and willing to participate.    [name] [name] 07/31/2024, 8:50 AM

## 2024-07-31 NOTE — Progress Notes (Signed)
° °  Neurosurgery Progress Note  History: MON[place]NA FASSNACHT is a 63 y.o s/p L5-S1 ALIF and PSF  POD2: Doing well, BM overnight POD1: Pt doing well this morning with expected abdominal pain. He has started to pass flatus.   Physical Exam: Vitals:   07/30/24 2120 07/31/24 0524  BP: 112/72 118/89  Pulse: 85 97  Resp: 17 17  Temp: 97.7 F (36.5 C) 98.4 F (36.9 C)  SpO2: 100% 98%    AA Ox3 CNI  Side Iliopsoas Quads Hamstring PF DF EHL  R 5 5 5 4 3  4+  L 5 5 5  4- 3 2   Incisions: c/d/I with dressings in place   Data:  Other tests/results: NA  Assessment/Plan:  ZYKEEM BAUSERMAN is a 63 y.o presenting with lumbar spondylolisthesis s/p L5-S1 ALIF and PSF  - cough: medicine stopped due to hallucinations - mobilize - pain control - DVT prophylaxis - PTOT; dispo planning underway. Brace to be worn when out of bed and ambulating with PT for comfort   Reeves Daisy MD Department of Neurosurgery

## 2024-08-01 NOTE — Progress Notes (Signed)
 Neurosurgery visit note Patient seen and examind, doing well. Pain controlled, eating. Awake fluent to his baseline and appropriate, bilateral LE grossly full strength and sensation Incision c/d/I no swelling  AP: overall the patient is doing quite well.  I would continue to mobilize today and likely ready to dispo tomorrow with good mobilization today.  Belvie PARAS. Matisha Termine,MD Neurosurgery

## 2024-08-01 NOTE — Progress Notes (Signed)
 Physical Therapy Treatment Patient Details Name: [name] MRN: [id number] DOB: [birth year] Today's Date: 08/01/2024   History of Present Illness 63 y.o s/p L5-S1 ALIF and PSF on 07/29/24. PMHx includes ADHD, anxiety, arthritis, COVD, GERD, HTN, multiple lacunar infarcts, MVP, rocky mountain spotted fever, sleep apnea, spinal stenosis, vestibular migraines, and ACDF 07/2022.    PT Comments  Pt progressed with gait training and working on increasing activity tolerance, ambulating with RW 4 laps around nursing station.  Pt took intermittent standing rest breaks and was steady throughout activity. Continued PT will assist pt towards greater dynamic standing balance, LE strengthening, and activity tolerance to increase safety and independence and decrease burden of care with functional mobility.  Pt will benefit from continued PT services upon discharge to safely address deficits listed in patient problem list for decreased caregiver assistance and eventual return to PLOF.      If plan is discharge home, recommend the following: A little help with walking and/or transfers;A little help with bathing/dressing/bathroom;Assist for transportation;Help with stairs or ramp for entrance   Can travel by private vehicle        Equipment Recommendations  None recommended by PT    Recommendations for Other Services       Precautions / Restrictions Precautions Precautions: Back;Fall Precaution Booklet Issued: Yes (comment) Recall of Precautions/Restrictions: Intact Required Braces or Orthoses: Spinal Brace Spinal Brace: Applied in sitting position;Lumbar corset Restrictions Weight Bearing Restrictions Per Provider Order: No     Mobility  Bed Mobility Overal bed mobility: Needs Assistance Bed Mobility: Sit to Supine       Sit to supine: Supervision   General bed mobility comments: Pt AA lifting his LLE with hooking R LE under ankle.    Transfers Overall transfer level: Needs  assistance Equipment used: Rolling walker (2 wheels) Transfers: Sit to/from Stand, Bed to chair/wheelchair/BSC Sit to Stand: Supervision   Step pivot transfers: Supervision       General transfer comment: Pt is steady    Ambulation/Gait Ambulation/Gait assistance: Supervision Gait Distance (Feet): 500 Feet (4 laps per pt request) Assistive device: Rolling walker (2 wheels) Gait Pattern/deviations: Step-through pattern, Step-to pattern       General Gait Details: No LOB,  step to pattern transitioned into step through as pt was ambulating.   Stairs             Wheelchair Mobility     Tilt Bed    Modified [name] (Stroke Patients Only)       Balance   Sitting-balance support: Feet supported Sitting balance-Leahy Scale: Good     Standing balance support: During functional activity, Reliant on assistive device for balance, Single extremity supported, No upper extremity supported Standing balance-Leahy Scale: Good                              Communication Communication Communication: No apparent difficulties  Cognition Arousal: Alert Behavior During Therapy: WFL for tasks assessed/performed   PT - Cognitive impairments: Safety/Judgement                       PT - Cognition Comments: lifted both feet off ground pushing through UEs on RW to pop back.  No LOB Following commands: Intact      Cueing Cueing Techniques: Verbal cues  Exercises      General Comments        Pertinent Vitals/Pain Pain Assessment Pain Assessment: No/denies pain  Home Living                          Prior Function            PT Goals (current goals can now be found in the care plan section) Acute Rehab PT Goals Patient Stated Goal: go home PT Goal Formulation: With patient/family Time For Goal Achievement: 08/13/24 Progress towards PT goals: Progressing toward goals    Frequency    Min 4X/week      PT Plan       Co-evaluation              AM-PAC PT 6 Clicks Mobility   Outcome Measure  Help needed turning from your back to your side while in a flat bed without using bedrails?: A Little Help needed moving from lying on your back to sitting on the side of a flat bed without using bedrails?: A Little Help needed moving to and from a bed to a chair (including a wheelchair)?: A Little Help needed standing up from a chair using your arms (e.g., wheelchair or bedside chair)?: A Little Help needed to walk in hospital room?: A Little Help needed climbing 3-5 steps with a railing? : A Little 6 Click Score: 18    End of Session Equipment Utilized During Treatment: Gait belt Activity Tolerance: Patient tolerated treatment well Patient left: with nursing/sitter in room;in bed;with bed alarm set Nurse Communication: Mobility status PT Visit Diagnosis: Unsteadiness on feet (R26.81);Other abnormalities of gait and mobility (R26.89);Difficulty in walking, not elsewhere classified (R26.2);Muscle weakness (generalized) (M62.81)     Time: 8484-8457 PT Time Calculation (min) (ACUTE ONLY): 27 min  Charges:    $Gait Training: 8-22 mins $Therapeutic Activity: 23-37 mins PT General Charges $$ ACUTE PT VISIT: 1 Visit                    Harland Irving, PTA  08/01/2024, 3:56 PM

## 2024-08-02 NOTE — Progress Notes (Signed)
 Neurosurgery visit note Patient seen and examined, doing well, mobilizing to bathroom well, pain controlled and tolerating PO. Incision c/d/I Exam notable for grossly full LE strength and sensation stable  AP: overall very reassured by his progress and mobilizaton. Would continue to mobilize with RN and PT as you are doing After discussing with patient's wife yesterday by phone about needs and support needing to be arranged at home would plan for DC tomorrow.  [name]. [name] Odette MD Neurosurgery

## 2024-08-02 NOTE — Progress Notes (Signed)
 Physical Therapy Treatment Patient Details Name: [name] MRN: [id number] DOB: 11-09-1960 Today's Date: 08/02/2024   History of Present Illness 63 y.o s/p L5-S1 ALIF and PSF on 07/29/24. PMHx includes ADHD, anxiety, arthritis, COVD, GERD, HTN, multiple lacunar infarcts, MVP, rocky mountain spotted fever, sleep apnea, spinal stenosis, vestibular migraines, and ACDF 07/2022.    PT Comments  Upon arrival to room.  Door closed.  Pt standing alone at sink brushing his teeth.  Walker to the side.  Pt does step sideways when asked so I can safely enter room and highly stressed need to call for assist and wait as he remains a fall risk and could have easily been knocked over by the door and no way for staff to enter the room.  Pt self selects gait distance of 4 laps on unit with RW and cga/supervision.  Pt does not want walker lowered to a more supportive height stating he feels they are dangerous and asking for crutches instead.  Educated on differences and general preference for RW vs crutches for safety.  He is resistant to rollator per wifes suggestion and does not want to try and given balance deficits RW is preferred by clinical research associate.  He has varied gait speed during session sometimes slow with choppy steps then progresses to almost a light jog.  Cues and education to keep gait speed consistent for general safety but he continues to do so throughout session.  He opt to return to bed with all 4 rails up and safety on. Wife in room.  Discussed discharge plan.  Stressed to pt need to call and wait for +1 assist at all times.  He continues with some safety deficits.  Wife stated she is home +24 but does leave the house to go to her art studio in the backyard. Reviewed need to call and wait for assist at all times - he is able to use cell phone to contact her.   If plan is discharge home, recommend the following: A little help with walking and/or transfers;A little help with bathing/dressing/bathroom;Assist  for transportation;Help with stairs or ramp for entrance   Can travel by private vehicle        Equipment Recommendations  None recommended by PT    Recommendations for Other Services       Precautions / Restrictions Precautions Precautions: Back;Fall Precaution Booklet Issued: Yes (comment) Recall of Precautions/Restrictions: Intact Required Braces or Orthoses: Spinal Brace Spinal Brace: Applied in sitting position;Lumbar corset Restrictions Weight Bearing Restrictions Per Provider Order: No     Mobility  Bed Mobility Overal bed mobility: Modified Independent               Patient Response: Cooperative, Impulsive  Transfers Overall transfer level: Needs assistance Equipment used: Rolling walker (2 wheels) Transfers: Sit to/from Stand Sit to Stand: Supervision                Ambulation/Gait Ambulation/Gait assistance: Supervision, Contact guard assist Gait Distance (Feet): 640 Feet Assistive device: Rolling walker (2 wheels) Gait Pattern/deviations: Step-through pattern, Step-to pattern, Narrow base of support Gait velocity: irregular - at times slow, at time he almost jogs  education provided     General Gait Details: irregular gait speed - self initiated, narrow BOS and general balance deficits   Stairs             Wheelchair Mobility     Tilt Bed Tilt Bed Patient Response: Cooperative, Impulsive  Modified [name] (Stroke Patients Only)  Balance Overall balance assessment: Needs assistance Sitting-balance support: Feet supported Sitting balance-Leahy Scale: Good     Standing balance support: During functional activity, Reliant on assistive device for balance, Single extremity supported, No upper extremity supported Standing balance-Leahy Scale: Fair                              Hotel Manager: No apparent difficulties  Cognition Arousal: Alert Behavior During Therapy: WFL for tasks  assessed/performed   PT - Cognitive impairments: Safety/Judgement                       PT - Cognition Comments: continues to get up and walk on his own at times desite education Following commands: Impaired Following commands impaired: Follows one step commands inconsistently, Follows multi-step commands inconsistently    Cueing Cueing Techniques: Verbal cues  Exercises      General Comments        Pertinent Vitals/Pain Pain Assessment Pain Assessment: No/denies pain Pain Intervention(s): Monitored during session, Repositioned    Home Living                          Prior Function            PT Goals (current goals can now be found in the care plan section) Progress towards PT goals: Progressing toward goals    Frequency    Min 4X/week      PT Plan      Co-evaluation              AM-PAC PT 6 Clicks Mobility   Outcome Measure  Help needed turning from your back to your side while in a flat bed without using bedrails?: None Help needed moving from lying on your back to sitting on the side of a flat bed without using bedrails?: None Help needed moving to and from a bed to a chair (including a wheelchair)?: A Little Help needed standing up from a chair using your arms (e.g., wheelchair or bedside chair)?: A Little Help needed to walk in hospital room?: A Little Help needed climbing 3-5 steps with a railing? : A Little 6 Click Score: 20    End of Session Equipment Utilized During Treatment: Gait belt Activity Tolerance: Patient tolerated treatment well Patient left: in bed;with bed alarm set;with family/visitor present;with call bell/phone within reach Nurse Communication: Mobility status PT Visit Diagnosis: Unsteadiness on feet (R26.81);Other abnormalities of gait and mobility (R26.89);Difficulty in walking, not elsewhere classified (R26.2);Muscle weakness (generalized) (M62.81)     Time: 8980-8956 PT Time Calculation (min)  (ACUTE ONLY): 24 min  Charges:    $Gait Training: 23-37 mins PT General Charges $$ ACUTE PT VISIT: 1 Visit                   Lauraine Gills, PTA 08/02/2024, 11:18 AM

## 2024-08-03 ENCOUNTER — Other Ambulatory Visit: Payer: Self-pay

## 2024-08-03 MED ORDER — OXYCODONE HCL 5 MG PO TABS
5.0000 mg | ORAL_TABLET | ORAL | 0 refills | Status: DC | PRN
  Filled 2024-08-03: qty 30, 5d supply, fill #0

## 2024-08-03 MED ORDER — METHOCARBAMOL 500 MG PO TABS
500.0000 mg | ORAL_TABLET | Freq: Three times a day (TID) | ORAL | 0 refills | Status: AC | PRN
  Filled 2024-08-03: qty 90, 30d supply, fill #0

## 2024-08-03 MED ORDER — SENNA 8.6 MG PO TABS
1.0000 | ORAL_TABLET | Freq: Two times a day (BID) | ORAL | 0 refills | Status: AC
  Filled 2024-08-03: qty 30, 15d supply, fill #0

## 2024-08-03 MED ORDER — POLYETHYLENE GLYCOL 3350 17 GM/SCOOP PO POWD
17.0000 g | Freq: Every day | ORAL | 0 refills | Status: DC | PRN
  Filled 2024-08-03: qty 238, 14d supply, fill #0

## 2024-08-03 NOTE — Telephone Encounter (Signed)
 Noted

## 2024-08-03 NOTE — Progress Notes (Signed)
° °  Neurosurgery Progress Note  History: [name] is a 63 y.o s/p L5-S1 ALIF and PSF  POD5: Pt physically doing well this morning.   POD2: Doing well, BM overnight POD1: Pt doing well this morning with expected abdominal pain. He has started to pass flatus.   Physical Exam: Vitals:   08/02/24 1935 08/03/24 0502  BP: 126/81 (!) 122/93  Pulse: 97 (!) 102  Resp: 18 16  Temp: 98.2 F (36.8 C) 98 F (36.7 C)  SpO2: 99% 98%    AA Ox3 CNI  Side Iliopsoas Quads Hamstring PF DF EHL  R 5 5 5 4 3  4+  L 5 5 5  4- 3 2    Data:  Other tests/results: NA  Assessment/Plan:  [name] is a 63 y.o presenting with lumbar spondylolisthesis s/p L5-S1 ALIF and PSF  - cough: medicine stopped due to hallucinations - mobilize - pain control - DVT prophylaxis - PTOT; dispo planning underway. Brace to be worn when out of bed and ambulating with PT for comfort   Edsel Goods PA-C Department of Neurosurgery

## 2024-08-03 NOTE — Progress Notes (Signed)
 Physical Therapy Treatment Patient Details Name: [name] MRN: [id number] DOB: [birth year] Today's Date: 08/03/2024   History of Present Illness 63 y.o s/p L5-S1 ALIF and PSF on 07/29/24. PMHx includes ADHD, anxiety, arthritis, COVD, GERD, HTN, multiple lacunar infarcts, MVP, rocky mountain spotted fever, sleep apnea, spinal stenosis, vestibular migraines, and ACDF 07/2022.    PT Comments  Pt ready for session.  Is able to complete x 6 laps with RW and cga x 1 with continued varied speeds and balance deficits.  Stressed education for safety, proper footwear and assistive devices.  He voices understanding and voiced back instructions.  Wife in for session. Pt refuses back brace stating he will not wear it.   Pt allowed walker to be adjusted to proper height today and educated pt and wife to check walker at home.  Pt in chair and awaiting discharge home.  No further questions or concerns from wife or pt voiced.     If plan is discharge home, recommend the following: A little help with walking and/or transfers;A little help with bathing/dressing/bathroom;Assist for transportation;Help with stairs or ramp for entrance   Can travel by private vehicle        Equipment Recommendations  None recommended by PT    Recommendations for Other Services       Precautions / Restrictions Precautions Precautions: Back;Fall Precaution Booklet Issued: Yes (comment) Recall of Precautions/Restrictions: Intact Required Braces or Orthoses: Spinal Brace Spinal Brace: Applied in sitting position;Lumbar corset Restrictions Weight Bearing Restrictions Per Provider Order: No     Mobility  Bed Mobility Overal bed mobility: Modified Independent               Patient Response: Cooperative  Transfers Overall transfer level: Needs assistance Equipment used: Rolling walker (2 wheels) Transfers: Sit to/from Stand Sit to Stand: Supervision   Step pivot transfers: Supervision       General  transfer comment: Pt is steady    Ambulation/Gait Ambulation/Gait assistance: Supervision, Contact guard assist Gait Distance (Feet): 960 Feet Assistive device: Rolling walker (2 wheels) Gait Pattern/deviations: Step-through pattern, Step-to pattern, Narrow base of support Gait velocity: irregular - at times slow, at time he almost jogs  education provided     General Gait Details: irregular gait speed - self initiated, narrow BOS and general balance deficits   Stairs         General stair comments: declined. stated he felt good about them.  verbalized technique   Wheelchair Mobility     Tilt Bed Tilt Bed Patient Response: Cooperative  Modified [name] (Stroke Patients Only)       Balance Overall balance assessment: Mild deficits observed, not formally tested, Needs assistance Sitting-balance support: Feet supported Sitting balance-Leahy Scale: Good     Standing balance support: During functional activity, Reliant on assistive device for balance, Single extremity supported, No upper extremity supported Standing balance-Leahy Scale: Fair                              Hotel Manager: No apparent difficulties  Cognition Arousal: Alert Behavior During Therapy: WFL for tasks assessed/performed   PT - Cognitive impairments: Safety/Judgement                         Following commands: Impaired Following commands impaired: Follows one step commands inconsistently, Follows multi-step commands inconsistently    Cueing Cueing Techniques: Verbal cues  Exercises  General Comments        Pertinent Vitals/Pain Pain Assessment Pain Assessment: Faces Faces Pain Scale: Hurts little more Pain Location: back  increases with gait distances but self selects distance Pain Descriptors / Indicators: Aching Pain Intervention(s): Limited activity within patient's tolerance, Monitored during session, Repositioned    Home  Living                          Prior Function            PT Goals (current goals can now be found in the care plan section) Progress towards PT goals: Progressing toward goals    Frequency    Min 4X/week      PT Plan      Co-evaluation              AM-PAC PT 6 Clicks Mobility   Outcome Measure  Help needed turning from your back to your side while in a flat bed without using bedrails?: None Help needed moving from lying on your back to sitting on the side of a flat bed without using bedrails?: None Help needed moving to and from a bed to a chair (including a wheelchair)?: A Little Help needed standing up from a chair using your arms (e.g., wheelchair or bedside chair)?: A Little Help needed to walk in hospital room?: A Little Help needed climbing 3-5 steps with a railing? : A Little 6 Click Score: 20    End of Session Equipment Utilized During Treatment: Gait belt Activity Tolerance: Patient tolerated treatment well Patient left: with family/visitor present;with call bell/phone within reach;in chair;with chair alarm set Nurse Communication: Mobility status PT Visit Diagnosis: Unsteadiness on feet (R26.81);Other abnormalities of gait and mobility (R26.89);Difficulty in walking, not elsewhere classified (R26.2);Muscle weakness (generalized) (M62.81)     Time: 9169-9145 PT Time Calculation (min) (ACUTE ONLY): 24 min  Charges:    $Gait Training: 23-37 mins PT General Charges $$ ACUTE PT VISIT: 1 Visit                   Lauraine Gills, PTA 08/03/2024, 9:04 AM

## 2024-08-03 NOTE — Discharge Summary (Signed)
 Discharge Summary  Patient ID: [name] MRN: [id number] DOB/AGE: [birth year] 63 y.o.  Admit date: 07/29/2024 Discharge date: 08/03/2024  Admission Diagnoses: M43.10 Pars defect with spondylolisthesis, M54.16 Lumbar radiculopathy, M62.561, M62.562 Atrophy of muscle of both lower legs  Discharge Diagnoses:  Principal Problem:   S/P lumbar fusion Active Problems:   Pars defect with spondylolisthesis   Atrophy of muscle of both lower legs   Lumbar radiculopathy   Discharged Condition: good  Hospital Course:  [name] is a 63 year old presenting with lumbar spondylolisthesis and radiculopathy status post L5-S1 ALIF and PSF.  His intraoperative course was uncomplicated.  He was admitted for therapy evaluation and monitoring.  He was seen and evaluated therapy and deemed appropriate for discharge home with home health services.  His pain was well-controlled and he was ultimately cleared for discharge home on postop day 5.  He was given prescriptions for pain medication, muscle laxer, and stool softeners to take as needed.  Consults: None  Significant Diagnostic Studies: see results review  Treatments: surgery: as above. Please see separately dictated operative report for further details.  Discharge Exam: Blood pressure (!) 122/93, pulse (!) 102, temperature 98 F (36.7 C), temperature source Oral, resp. rate 16, height 5' 7 (1.702 m), weight 61.2 kg, SpO2 98%.  AA Ox3 CNI   Side Iliopsoas Quads Hamstring PF DF EHL  R 5 5 5  4- 3 4+  L 5 5 5  4- 3 2    Incisions: c/d/I with dressings in place   Disposition: Discharge disposition: 06-Home-Health Care Svc       Discharge Instructions     If the dressing is still on your incision site when you go home, remove it on the third day after your surgery date. Remove dressing if it begins to fall off, or if it is dirty or damaged before the third day.   Complete by: As directed    Incentive spirometry RT   Complete  by: As directed       Allergies as of 08/03/2024       Reactions   Lisinopril  Cough   Side effects   Propranolol  Other (See Comments)   Side effects        Medication List     PAUSE taking these medications    aspirin  EC 325 MG tablet Wait to take this until your doctor or other care provider tells you to start again. Take 325 mg by mouth as needed (Pain). You also have another medication with the same name that you may need to continue taking.       TAKE these medications    acetaminophen  500 MG tablet Commonly known as: TYLENOL  Take 500 mg by mouth every 6 (six) hours as needed.   amLODipine  5 MG tablet Commonly known as: NORVASC  Take 1 tablet (5 mg total) by mouth daily.   aspirin  EC 81 MG tablet Take 81 mg by mouth daily. Swallow whole. What changed: Another medication with the same name was paused. Ask your nurse or doctor if you should take this medication.   COLACE PO Take 50 mg by mouth daily. Daily   CORICIDIN HBP PO Take by mouth daily as needed.   fexofenadine 180 MG tablet Commonly known as: ALLEGRA Take 180 mg by mouth every evening.   hydrochlorothiazide  12.5 MG capsule Commonly known as: MICROZIDE  Take 1 capsule (12.5 mg total) by mouth daily.   LORazepam  0.5 MG tablet Commonly known as: ATIVAN  Take 1 tablet (0.5 mg total) by  mouth 2 (two) times daily as needed for anxiety.   methocarbamol  500 MG tablet Commonly known as: ROBAXIN  Take 1 tablet (500 mg total) by mouth 3 (three) times daily as needed for muscle spasms.   mirtazapine  30 MG disintegrating tablet Commonly known as: REMERON  SOL-TAB Take 1 tablet (30 mg total) by mouth at bedtime.   olmesartan  20 MG tablet Commonly known as: BENICAR  Take 1 tablet (20 mg total) by mouth daily. What changed: when to take this   omeprazole  40 MG capsule Commonly known as: PRILOSEC Take 1 capsule (40 mg total) by mouth daily.   oxyCODONE  5 MG immediate release tablet Commonly known  as: Oxy IR/ROXICODONE  Take 1 tablet (5 mg total) by mouth every 4 (four) hours as needed for moderate pain (pain score 4-6).   polyethylene glycol powder 17 GM/SCOOP powder Commonly known as: GLYCOLAX /MIRALAX  Take 17 g by mouth daily as needed for moderate constipation. Dissolve 1 capful (17g) in 4-8 ounces of liquid and take by mouth daily.   pyridOXINE 50 MG tablet Commonly known as: B-6 Take 50 mg by mouth daily.   rizatriptan  10 MG tablet Commonly known as: Maxalt  Take 1 tablet (10 mg total) by mouth as needed for migraine. May repeat in 2 hours if needed   senna 8.6 MG Tabs tablet Commonly known as: SENOKOT Take 1 tablet (8.6 mg total) by mouth 2 (two) times daily.   VITAMIN B 12 PO Take 5,000 mcg by mouth daily.   Cyanocobalamin  1000 MCG/ML Kit Inject into the muscle. Every three months   VITAMIN D3 PO Take 5,000 Int'l Units by mouth daily.               Discharge Care Instructions  (From admission, onward)           Start     Ordered   08/03/24 0000  If the dressing is still on your incision site when you go home, remove it on the third day after your surgery date. Remove dressing if it begins to fall off, or if it is dirty or damaged before the third day.        08/03/24 0756            Contact information for after-discharge care     Home Medical Care     [facility]) .   Service: Home Health Services Contact information: [address] [place]  [phone]                     Signed: Edsel Jama Goods 08/03/2024, 9:28 AM

## 2024-08-03 NOTE — Progress Notes (Signed)
°   08/03/24 0930  Spiritual Encounters  Type of Visit Initial  Care provided to: Pt not available (Pt had been discharged)  Conversation partners present during encounter Nurse  Referral source Nurse (RN/NT/LPN)  Reason for visit Religious ritual  OnCall Visit Yes

## 2024-08-06 ENCOUNTER — Ambulatory Visit

## 2024-08-06 ENCOUNTER — Encounter: Admitting: Occupational Therapy

## 2024-08-07 NOTE — Telephone Encounter (Unsigned)
 Copied from CRM #[id number]. Topic: Clinical - Home Health Verbal Orders >> Aug 07, 2024 12:39 PM Tonda B wrote: Caller/Agency: [facility] Callback Number: [phone] Service Requested: Physical Therapy Frequency: 1 week for week Any new concerns about the patient? No

## 2024-08-07 NOTE — Telephone Encounter (Signed)
 Information fwd to the provider.

## 2024-08-09 NOTE — Progress Notes (Unsigned)
" ° °  REFERRING PHYSICIAN:  Melvin Pao, Np [address] [place],  [place] [address]  DOS: 07/29/24  PSF/ALIF L5-S1  HISTORY OF PRESENT ILLNESS: [name] is approximately 2 weeks status post above surgery. Was given oxycodone , robaxin  on discharge from the hospital.   He has chronic cough and this makes his abdominal incision pain worse. Wearing brace aggravates this incision as well. Having some soreness around lumbar incisions as well.   He has only taking oxycodone  twice since coming home from hospital. He is taking prn robaxin .   He is starting HHPT today and HHOT tomorrow.    PHYSICAL EXAMINATION:  General: Patient is well developed, well nourished, calm, collected, and in no apparent distress.   NEUROLOGICAL:  General: In no acute distress.   Awake, alert, oriented to person, place, and time.  Pupils equal round and reactive to light.  Facial tone is symmetric.    Strength:            Side Iliopsoas Quads Hamstring PF DF EHL  R 5 5 5  4- 3 4  L 5 5 5  4- 3 2   Incisions c/d/i   ROS (Neurologic):  Negative except as noted above  IMAGING: Nothing new to review.   ASSESSMENT/PLAN:  [name] is doing fair s/p above surgery. Treatment options reviewed with patient and following plan made:   - I have advised the patient to lift up to 10 pounds until 6 weeks after surgery (follow up with Dr. Clois).  - Reviewed wound care.  - No bending, twisting, or lifting.  - Dr. Clois recommends he use walker, not cruches/cane. He should wear brace when up and walking.  - He should follow up with PCP regarding weight loss and ideas for weight gain.  - We recommend he take his oxycodone  as needed for pain. Per his wife, he has been refusing it. He states he will take it if he needs it. - Follow up as scheduled in 4 weeks and prn.   He mentions full body convulsions that started after his surgery. Hard to explain and only last for a second. Reviewed with  Dr. Clois, he should follow up with neurology [name]) about this. He does not think it is related to his surgery.   Advised to contact the office if any questions or concerns arise.  [name] Boys PA-C Department of neurosurgery "

## 2024-08-10 ENCOUNTER — Telehealth: Payer: Self-pay

## 2024-08-10 NOTE — Telephone Encounter (Signed)
 Okay for verbal orders.

## 2024-08-10 NOTE — Telephone Encounter (Signed)
 Called and left a detailed VM for the okay on the verbal order per Darice DEL

## 2024-08-10 NOTE — Telephone Encounter (Signed)
 Called [facility] at [phone], and left a detailed VM in regards to the verbal okay for the order per Darice DEL.

## 2024-08-10 NOTE — Telephone Encounter (Signed)
 Copied from CRM #[id number]. Topic: Clinical - Home Health Verbal Orders >> Aug 07, 2024  5:03 PM Kevelyn M wrote: Caller/Agency: [facility] Callback Number: [phone] Service Requested: Occupational Therapy Frequency: 2 week 1, 1 week 1 Any new concerns about the patient? No

## 2024-08-11 ENCOUNTER — Ambulatory Visit: Admitting: Orthopedic Surgery

## 2024-08-11 ENCOUNTER — Encounter: Payer: Self-pay | Admitting: Neurosurgery

## 2024-08-11 ENCOUNTER — Encounter: Payer: Self-pay | Admitting: Orthopedic Surgery

## 2024-08-11 VITALS — BP 122/80 | Temp 98.3°F | Ht 67.0 in | Wt 128.4 lb

## 2024-08-11 DIAGNOSIS — M4310 Spondylolisthesis, site unspecified: Secondary | ICD-10-CM

## 2024-08-11 DIAGNOSIS — Z981 Arthrodesis status: Secondary | ICD-10-CM

## 2024-08-16 NOTE — Telephone Encounter (Signed)
 Please send HHPT/OT orders to [facility].

## 2024-08-17 ENCOUNTER — Encounter: Admitting: Nurse Practitioner

## 2024-08-17 NOTE — Telephone Encounter (Signed)
"  Orders have been sent.  "

## 2024-08-21 ENCOUNTER — Encounter

## 2024-08-27 ENCOUNTER — Ambulatory Visit

## 2024-08-27 ENCOUNTER — Encounter: Admitting: Occupational Therapy

## 2024-08-31 ENCOUNTER — Telehealth: Payer: Self-pay

## 2024-08-31 DIAGNOSIS — M62562 Muscle wasting and atrophy, not elsewhere classified, left lower leg: Secondary | ICD-10-CM

## 2024-08-31 NOTE — Addendum Note (Signed)
 Addended by: SEBASTIAN ELENOR HERO on: 08/31/2024 03:24 PM   Modules accepted: Orders

## 2024-08-31 NOTE — Telephone Encounter (Signed)
 FYI notes below from a conversation with Nichole. Stacy placed referral for [facility] originally. Will let Glade know and get her feedback.

## 2024-08-31 NOTE — Telephone Encounter (Signed)
 If he would prefer going to outpatient PT then that is fine. Let me know if I need to send orders.

## 2024-08-31 NOTE — Telephone Encounter (Addendum)
 Sorry! Here are the notes Patient recently obtained new insurance and is seeking alternative options for an at-home health agency. If outpatient physical therapy becomes necessary, he prefers to go to [facility]  with [facility] outpatient PT. His spouse expressed concern because there was a lapse in care following surgery, and the previous home health agency discontinued services due to the insurance change. She would like to explore available options. I advised her to contact the insurance provider for a list of in-network recommendations. She is wondering if Dr.Y is comfortable with him going in to an actual office.

## 2024-08-31 NOTE — Telephone Encounter (Signed)
 Patient's wife was notified that the referral has been placed to [facility] for PT. She states she is going to try still for home health but if she doesn't have any luck she will reach out to [facility] in the next day or 2. She is concerned of the laps in care by waiting on Home Health.

## 2024-08-31 NOTE — Telephone Encounter (Signed)
 I don't see any attached notes, what's going on?

## 2024-09-01 ENCOUNTER — Encounter: Payer: Self-pay | Admitting: Neurosurgery

## 2024-09-01 ENCOUNTER — Encounter: Admitting: Orthopedic Surgery

## 2024-09-02 ENCOUNTER — Other Ambulatory Visit: Payer: Self-pay | Admitting: Neurosurgery

## 2024-09-02 DIAGNOSIS — Z981 Arthrodesis status: Secondary | ICD-10-CM

## 2024-09-02 NOTE — Telephone Encounter (Signed)
 Ritta- do you know any other home health agencies that are in network with his new insurance? Please follow up with patient- Thanks.

## 2024-09-03 ENCOUNTER — Encounter: Admitting: Occupational Therapy

## 2024-09-03 ENCOUNTER — Ambulatory Visit

## 2024-09-07 ENCOUNTER — Ambulatory Visit: Admitting: Psychology

## 2024-09-08 ENCOUNTER — Ambulatory Visit

## 2024-09-08 ENCOUNTER — Ambulatory Visit: Admitting: Neurosurgery

## 2024-09-08 ENCOUNTER — Encounter: Payer: Self-pay | Admitting: Neurosurgery

## 2024-09-08 ENCOUNTER — Ambulatory Visit: Admitting: Nurse Practitioner

## 2024-09-08 VITALS — BP 126/78 | Temp 98.2°F | Ht 67.0 in | Wt 128.0 lb

## 2024-09-08 DIAGNOSIS — Z09 Encounter for follow-up examination after completed treatment for conditions other than malignant neoplasm: Secondary | ICD-10-CM

## 2024-09-08 DIAGNOSIS — Z981 Arthrodesis status: Secondary | ICD-10-CM

## 2024-09-08 DIAGNOSIS — M4310 Spondylolisthesis, site unspecified: Secondary | ICD-10-CM

## 2024-09-08 DIAGNOSIS — M62562 Muscle wasting and atrophy, not elsewhere classified, left lower leg: Secondary | ICD-10-CM

## 2024-09-08 DIAGNOSIS — M62561 Muscle wasting and atrophy, not elsewhere classified, right lower leg: Secondary | ICD-10-CM

## 2024-09-08 NOTE — Progress Notes (Signed)
" ° °  REFERRING PHYSICIAN:  Melvin Pao, Np [address] [place],  [place] [address]  DOS: 07/29/24  PSF/ALIF L5-S1  HISTORY OF PRESENT ILLNESS: [name] is status post above surgery.   He is doing well.  He has limited pain.   PHYSICAL EXAMINATION:  General: Patient is well developed, well nourished, calm, collected, and in no apparent distress.   NEUROLOGICAL:  General: In no acute distress.   Awake, alert, oriented to person, place, and time.  Pupils equal round and reactive to light.  Facial tone is symmetric.    Strength:            Side Iliopsoas Quads Hamstring PF DF EHL  R 5 5 5  4- 3 4  L 5 5 5  4- 3 2   Incisions c/d/i   ROS (Neurologic):  Negative except as noted above  IMAGING: No complications noted  ASSESSMENT/PLAN:  HO PARISI is doing well s/p above surgery.   He will start outpatient physical therapy.  They will determine whether he is appropriate to advance from use of a walker.  We discussed activity limitations.  I have released him to begin walking outside but only on flat ground.   [facility] Department of neurosurgery "

## 2024-09-10 ENCOUNTER — Ambulatory Visit

## 2024-09-10 ENCOUNTER — Ambulatory Visit: Attending: Orthopedic Surgery

## 2024-09-10 ENCOUNTER — Encounter: Admitting: Occupational Therapy

## 2024-09-10 DIAGNOSIS — M62561 Muscle wasting and atrophy, not elsewhere classified, right lower leg: Secondary | ICD-10-CM | POA: Insufficient documentation

## 2024-09-10 DIAGNOSIS — M6281 Muscle weakness (generalized): Secondary | ICD-10-CM | POA: Insufficient documentation

## 2024-09-10 DIAGNOSIS — M62562 Muscle wasting and atrophy, not elsewhere classified, left lower leg: Secondary | ICD-10-CM | POA: Diagnosis not present

## 2024-09-10 DIAGNOSIS — R278 Other lack of coordination: Secondary | ICD-10-CM | POA: Diagnosis present

## 2024-09-10 DIAGNOSIS — R2681 Unsteadiness on feet: Secondary | ICD-10-CM | POA: Diagnosis present

## 2024-09-10 DIAGNOSIS — R262 Difficulty in walking, not elsewhere classified: Secondary | ICD-10-CM | POA: Insufficient documentation

## 2024-09-10 NOTE — Therapy (Unsigned)
 " OUTPATIENT PHYSICAL THERAPY NEURO EVALUATION   Patient Name: [name] MRN: [id number] DOB:February 09, 1961, 64 y.o., male Today's Date: 09/11/2024   PCP: Melvin Pao, NP  REFERRING PROVIDER: Hilma Hastings, PA-C   END OF SESSION:  PT End of Session - 09/10/24 1529     Visit Number 1    Date for Recertification  12/03/24    PT Start Time 1530    PT Stop Time 1615    PT Time Calculation (min) 45 min    Equipment Utilized During Treatment Gait belt    Activity Tolerance Patient tolerated treatment well          Past Medical History:  Diagnosis Date   ADHD    Anxiety    a.) on BZO (lorazepam ) PRN   Arthritis    Concussion    COVID 12/2020   GERD (gastroesophageal reflux disease)    Hypertension    Long-term use of aspirin  therapy    Multiple lacunar infarcts (HCC)    MVP (mitral valve prolapse)    Pernicious anemia    [place] spotted fever    Sleep apnea    Spinal stenosis    Stroke (HCC)    Vestibular migraine    Vitamin B12 deficiency    Vitamin D  deficiency    Past Surgical History:  Procedure Laterality Date   ABDOMINAL EXPOSURE N/A 07/29/2024   Procedure: ABDOMINAL EXPOSURE;  Surgeon: Marea Selinda RAMAN, MD;  Location: [facility];  Service: Vascular;  Laterality: N/A;   ANTERIOR AND POSTERIOR SPINAL FUSION N/A 07/29/2024   Procedure: ANTERIOR AND POSTERIOR SPINAL FUSION;  Surgeon: Clois Fret, MD;  Location: [facility];  Service: Neurosurgery;  Laterality: N/A;  L5-S1 ANTERIOR LUMBAR INTERBODY FUSION AND POSTERIOR SPINAL FUSION   ANTERIOR CERVICAL DECOMP/DISCECTOMY FUSION N/A 07/30/2022   Procedure: C4-7 ANTERIOR CERVICAL DISCECTOMY AND FUSION (GLOBUS HEDRON);  Surgeon: Clois Fret, MD;  Location: [facility];  Service: Neurosurgery;  Laterality: N/A;   APPLICATION OF CELL SAVER N/A 07/29/2024   Procedure: APPLICATION OF CELL SAVER;  Surgeon: Clois Fret, MD;  Location: [facility];  Service: Neurosurgery;  Laterality: N/A;   APPLICATION  OF INTRAOPERATIVE CT SCAN N/A 07/29/2024   Procedure: APPLICATION OF INTRAOPERATIVE CT SCAN;  Surgeon: Clois Fret, MD;  Location: [facility];  Service: Neurosurgery;  Laterality: N/A;   BILATERAL CARPAL TUNNEL RELEASE     CERVICAL WOUND DEBRIDEMENT N/A 08/01/2022   Procedure: CERVICAL WOUND DEBRIDEMENT OF HEMATOMA;  Surgeon: Clois Fret, MD;  Location: [facility];  Service: Neurosurgery;  Laterality: N/A;   COLONOSCOPY     COLONOSCOPY WITH PROPOFOL  N/A 12/17/2023   Procedure: COLONOSCOPY WITH PROPOFOL ;  Surgeon: Maryruth Ole DASEN, MD;  Location: [facility];  Service: Endoscopy;  Laterality: N/A;   ESOPHAGOGASTRODUODENOSCOPY (EGD) WITH PROPOFOL  N/A 12/17/2023   Procedure: ESOPHAGOGASTRODUODENOSCOPY (EGD) WITH PROPOFOL ;  Surgeon: Maryruth Ole DASEN, MD;  Location: [facility];  Service: Endoscopy;  Laterality: N/A;   FRACTURE SURGERY Left 1984   intramedullary rod   hip bone transplant  1984   LEG SURGERY Left    oseotomy   POLYPECTOMY  12/17/2023   Procedure: POLYPECTOMY, INTESTINE;  Surgeon: Maryruth Ole DASEN, MD;  Location: [facility];  Service: Endoscopy;;   REPAIR ANKLE LIGAMENT Right 1978   REPAIR KNEE LIGAMENT Bilateral    acl   TRIGGER FINGER RELEASE Bilateral    thumbs   Patient Active Problem List   Diagnosis Date Noted   S/P lumbar fusion 07/29/2024   Pars defect with spondylolisthesis 07/29/2024  Atrophy of muscle of both lower legs 07/29/2024   Lumbar radiculopathy 07/29/2024   Cubital tunnel syndrome on right 02/16/2024   Unintentional weight loss 02/12/2024   Lacunar stroke (HCC) 12/12/2023   Essential tremor 12/12/2023   Dizziness 10/30/2023   Postoperative hematoma of musculoskeletal structure following musculoskeletal procedure 08/01/2022   Cervical myelopathy (HCC) 07/30/2022   Cervical spinal stenosis 07/30/2022   Radiculopathy, cervical 07/30/2022   S/P cervical spinal fusion 07/30/2022   Vitamin D  deficiency 06/25/2022   B12  deficiency 06/25/2022   Elevated LDL cholesterol level 06/25/2022   Prediabetes 03/27/2021   History of nausea- reported 01/27/2020 02/02/2020   Accidental fall from ladder 02/02/2020   Acute right-sided thoracic back pain 02/02/2020   Neck stiffness 02/02/2020   Concussion with no loss of consciousness 02/02/2020   Multiple falls 02/02/2020   Head trauma, initial encounter 02/02/2020   Acute post-traumatic headache, not intractable 02/02/2020   Essential hypertension 08/26/2018   Migraine with vertigo 01/05/2014   Arthralgia of multiple joints 04/28/2009   Carpal tunnel syndrome 08/30/2008    ONSET DATE: DOS: 07/29/24  PSF/ALIF L5-S1   REFERRING DIAG: F37.438,F37.437 (ICD-10-CM) - Atrophy of muscle of both lower legs   THERAPY DIAG:  Muscle weakness (generalized)  Other lack of coordination  Difficulty in walking, not elsewhere classified  Unsteadiness on feet  Rationale for Evaluation and Treatment: Rehabilitation  SUBJECTIVE:                                                                                                                                                                                             SUBJECTIVE STATEMENT: Patient reports having multiple areas of stenosis along with atrophy in the lower extremities with minimal innervation to the lower extremities. He reports falling quite frequently, especially backwards. Pt has complicated injury in the past resulting in leg length discrepancy. He reports he can only bend the L knee 45 degrees. He reports the R LE is longer than the L. Prior to the surgery the patient was walking with a SPC and occasionally crutches. Patient has tried a rollator in the past but did not like it due to feeling like the COG is further away from him. Patient did have HHPT and was discharged from them 08/19/2025.    Pt accompanied by: significant other  PERTINENT HISTORY: DOS: 07/29/24  PSF/ALIF L5-S1   PAIN:  Are you having pain?  No  PRECAUTIONS: None  RED FLAGS: None   WEIGHT BEARING RESTRICTIONS: No  FALLS: Has patient fallen in last 6 months? Yes. Number of falls 5  LIVING ENVIRONMENT: Lives with: lives with their family and  lives with their spouse Lives in: House/apartment Stairs: Yes: External: 3-4 steps; can reach both Has following equipment at home: Single point cane, Walker - 2 wheeled, Environmental Consultant - 4 wheeled, and Crutches  PLOF: Independent with community mobility with device  PATIENT GOALS: Improved gait, improved cores stability, improved safety with gait, improved balance.   OBJECTIVE:  Note: Objective measures were completed at Evaluation unless otherwise noted.  DIAGNOSTIC FINDINGS:  EXAM: LUMBAR SPINE - 2-3 VIEW   COMPARISON:  Lumbar spine MRI dated 02/29/2024.   FINDINGS: Four C-arm of the lumbosacral region demonstrate interbody and screw fusion at the L5-S1 level with improved anterolisthesis at that level, currently grade 1.   IMPRESSION: L5-S1 fusion with improved anterolisthesis.     Electronically Signed   By: Elspeth Bathe M.D.   On: 07/29/2024 13:27  COGNITION: Overall cognitive status: Within functional limits for tasks assessed   SENSATION: WFL     POSTURE: No Significant postural limitations  LOWER EXTREMITY ROM:     L knee flexion contracture.   LOWER EXTREMITY MMT:    MMT Right Eval Left Eval  Hip flexion 4 4-  Hip extension    Hip abduction 4 4-  Hip adduction 4+ 4+  Hip internal rotation    Hip external rotation    Knee flexion 4- 4-  Knee extension 4+ 5  Ankle dorsiflexion 3+ 3-  Ankle plantarflexion 3 3  Ankle inversion    Ankle eversion    (Blank rows = not tested)   TRANSFERS: Independent without use of hands.    STAIRS: Not tested GAIT: Findings: Shuffling gait with decreased step length bilaterally.   FUNCTIONAL TESTS:  5 times sit to stand: 8.45 Timed up and go (TUG): 12.5 6 minute walk test: 600' 10 meter walk test:  DNT  PATIENT SURVEYS:   ABC scale: To be tested at initial treatment.                                                                                                                              TREATMENT DATE: 09/10/2024  -Safety with AD along with proper AD at this time.  -Proper mechanics following no BLT when performing certain tasks such as supine to sit transfer and transferring in/out of vehicle.   PATIENT EDUCATION: Education detailsNetwork Engineer with AD.  Person educated: Patient and Spouse Education method: Explanation Education comprehension: verbalized understanding  HOME EXERCISE PROGRAM: To be given at initial treatment session.   GOALS: Goals reviewed with patient? Yes  SHORT TERM GOALS: Target date: 09/24/2024  Pt will be independent with HEP in 2 weeks allowing him to perform HEP at home safely and efficiently with proper mechanics.  Baseline: HEP to be initiated at first treatment. Goal status: INITIAL   LONG TERM GOALS: Target date: 12/03/2024     1.  Patient will reduce timed up and go to <11 seconds to reduce fall risk and demonstrate improved transfer/gait ability. Baseline: 12.5  Goal status: INITIAL  2.  Patient will increase 10 meter walk test to >1.75m/s as to improve gait speed for better community ambulation and to reduce fall risk. Baseline: Test at initial treatment. Goal status: INITIAL  3.  Patient will increase six minute walk test distance to >1000 for progression to community ambulator and improve gait ability Baseline: 600 Goal status: INITIAL  4.  Patient will increase ABC score by 15% to demonstrate decreased fall risk during functional activities. Baseline: Test at initial treatment.  Goal status: INITIAL  5. Patient will demonstrate ability to ambulate at least 250' independently and safely with least restrictive device by discharge allowing him to [place] community distances with more efficiency/safety.  BASELINE: Pt required CGA  using FWW at evaluation.  Goal Status: INITIAL.   ASSESSMENT:  CLINICAL IMPRESSION: Patient is a 64 y.o. male who was seen today for physical therapy evaluation and treatment for decreased lower extremity strength following s/p PSF/ALIF L5-S1 performed 07/29/2024. Pt has been using FWW following his surgery for mobility and wishes to become more independent with gait. He has not had any falls since surgery, but reports numerous falls prior to. Patient also has complicated medical history including lacunar stroke, leg length discrepancy, and L knee contracture that factor into his mobility level/function.   Upon examination, patient was noted to have sensation that was WNL. Decreased LE strength was noted with MMT. Patient was noted to have decreased gait speed along with decreased endurance during the 6 minute walk test requiring multiple breaks and stopping early. Patient was noted to have decreased safety awareness when using FWW often times picking it up completely off ground when performing turns. Gait deviations noted with decreased gait speed and decreased step length bilaterally.  Overall, patient is a good candidate for skilled PT to improve on deficits listed above allowing him to perform gait and functional activities with less difficulty and decreased risk for falls. Pt will benefit from skilled PT 2x/week to improve toward goals established.   OBJECTIVE IMPAIRMENTS: Abnormal gait, decreased activity tolerance, decreased balance, decreased coordination, decreased endurance, decreased knowledge of use of DME, decreased mobility, difficulty walking, decreased ROM, decreased strength, and decreased safety awareness.   ACTIVITY LIMITATIONS: carrying, lifting, bending, sitting, standing, squatting, stairs, and transfers  PARTICIPATION LIMITATIONS: meal prep, cleaning, laundry, driving, shopping, community activity, and yard work  PERSONAL FACTORS: Behavior pattern, Education, Past/current  experiences, and 3+ comorbidities: Prediabetes, elevated LDL cholesterol, Lacunar Stroke are also affecting patient's functional outcome.   REHAB POTENTIAL: Good  CLINICAL DECISION MAKING: Evolving/moderate complexity  EVALUATION COMPLEXITY: Moderate  PLAN:  PT FREQUENCY: 2x/week  PT DURATION: 12 weeks  PLANNED INTERVENTIONS: [id number]- PT Re-evaluation, 97750- Physical Performance Testing, 97110-Therapeutic exercises, 97530- Therapeutic activity, [id number]- Neuromuscular re-education, 97535- Self Care, [id number]- Manual therapy, [phone]- Gait training, Patient/Family education, Balance training, Stair training, and Vestibular training  PLAN FOR NEXT SESSION:  -Perform 10 MWT -Assess and recommend best AD for patient at this time.  -Establish an HEP.   Possibly perform ABC scale or LEFS  Norman KATHEE Sharps, PT 09/11/2024, 8:17 AM        "

## 2024-09-11 ENCOUNTER — Ambulatory Visit: Admitting: Physical Therapy

## 2024-09-15 ENCOUNTER — Ambulatory Visit

## 2024-09-16 ENCOUNTER — Other Ambulatory Visit: Payer: Self-pay | Admitting: Nurse Practitioner

## 2024-09-16 NOTE — Telephone Encounter (Signed)
 Requested Prescriptions  Pending Prescriptions Disp Refills   olmesartan  (BENICAR ) 20 MG tablet [Pharmacy Med Name: OLMESARTAN  MEDOXOMIL 20 MG TAB] 90 tablet 0    Sig: Take 1 tablet (20 mg total) by mouth every evening.     Cardiovascular:  Angiotensin Receptor Blockers Failed - 09/16/2024  4:16 PM      Failed - Cr in normal range and within 180 days    Creatinine  Date Value Ref Range Status  11/13/2011 0.70 0.60 - 1.30 mg/dL Final   Creatinine, Ser  Date Value Ref Range Status  07/01/2024 0.74 (L) 0.76 - 1.27 mg/dL Final         Passed - K in normal range and within 180 days    Potassium  Date Value Ref Range Status  07/01/2024 4.4 3.5 - 5.2 mmol/L Final  11/13/2011 4.1 3.5 - 5.1 mmol/L Final         Passed - Patient is not pregnant      Passed - Last BP in normal range    BP Readings from Last 1 Encounters:  09/08/24 126/78         Passed - Valid encounter within last 6 months    Recent Outpatient Visits           2 months ago Essential hypertension   [place] [facility] Melvin Pao, NP   2 months ago Prediabetes   [place] [facility] Melvin Pao, NP   4 months ago Unintentional weight loss   [place] [facility] Melvin Pao, NP   7 months ago Multiple falls   [place] [facility] Melvin Pao, NP   8 months ago Prediabetes   [place] [facility] Melvin Pao, NP       Future Appointments             In 1 month End, Lonni, MD [facility]

## 2024-09-16 NOTE — Therapy (Signed)
 " OUTPATIENT PHYSICAL THERAPY NEURO TREATMENT   Patient Name: [name] MRN: [id number] DOB:30-Dec-1960, 64 y.o., male Today's Date: 09/17/2024   PCP: Melvin Pao, NP  REFERRING PROVIDER: Hilma Hastings, PA-C   END OF SESSION:  PT End of Session - 09/17/24 1400     Visit Number 2    Date for Recertification  12/03/24    PT Start Time 1400    PT Stop Time 1444    PT Time Calculation (min) 44 min    Equipment Utilized During Treatment Gait belt    Activity Tolerance Patient tolerated treatment well           Past Medical History:  Diagnosis Date   ADHD    Anxiety    a.) on BZO (lorazepam ) PRN   Arthritis    Concussion    COVID 12/2020   GERD (gastroesophageal reflux disease)    Hypertension    Long-term use of aspirin  therapy    Multiple lacunar infarcts (HCC)    MVP (mitral valve prolapse)    Pernicious anemia    [place] spotted fever    Sleep apnea    Spinal stenosis    Stroke (HCC)    Vestibular migraine    Vitamin B12 deficiency    Vitamin D  deficiency    Past Surgical History:  Procedure Laterality Date   ABDOMINAL EXPOSURE N/A 07/29/2024   Procedure: ABDOMINAL EXPOSURE;  Surgeon: Marea Selinda RAMAN, MD;  Location: [facility];  Service: Vascular;  Laterality: N/A;   ANTERIOR AND POSTERIOR SPINAL FUSION N/A 07/29/2024   Procedure: ANTERIOR AND POSTERIOR SPINAL FUSION;  Surgeon: Clois Fret, MD;  Location: [facility];  Service: Neurosurgery;  Laterality: N/A;  L5-S1 ANTERIOR LUMBAR INTERBODY FUSION AND POSTERIOR SPINAL FUSION   ANTERIOR CERVICAL DECOMP/DISCECTOMY FUSION N/A 07/30/2022   Procedure: C4-7 ANTERIOR CERVICAL DISCECTOMY AND FUSION (GLOBUS HEDRON);  Surgeon: Clois Fret, MD;  Location: [facility];  Service: Neurosurgery;  Laterality: N/A;   APPLICATION OF CELL SAVER N/A 07/29/2024   Procedure: APPLICATION OF CELL SAVER;  Surgeon: Clois Fret, MD;  Location: [facility];  Service: Neurosurgery;  Laterality: N/A;   APPLICATION  OF INTRAOPERATIVE CT SCAN N/A 07/29/2024   Procedure: APPLICATION OF INTRAOPERATIVE CT SCAN;  Surgeon: Clois Fret, MD;  Location: [facility];  Service: Neurosurgery;  Laterality: N/A;   BILATERAL CARPAL TUNNEL RELEASE     CERVICAL WOUND DEBRIDEMENT N/A 08/01/2022   Procedure: CERVICAL WOUND DEBRIDEMENT OF HEMATOMA;  Surgeon: Clois Fret, MD;  Location: [facility];  Service: Neurosurgery;  Laterality: N/A;   COLONOSCOPY     COLONOSCOPY WITH PROPOFOL  N/A 12/17/2023   Procedure: COLONOSCOPY WITH PROPOFOL ;  Surgeon: Maryruth Ole DASEN, MD;  Location: [facility];  Service: Endoscopy;  Laterality: N/A;   ESOPHAGOGASTRODUODENOSCOPY (EGD) WITH PROPOFOL  N/A 12/17/2023   Procedure: ESOPHAGOGASTRODUODENOSCOPY (EGD) WITH PROPOFOL ;  Surgeon: Maryruth Ole DASEN, MD;  Location: [facility];  Service: Endoscopy;  Laterality: N/A;   FRACTURE SURGERY Left 1984   intramedullary rod   hip bone transplant  1984   LEG SURGERY Left    oseotomy   POLYPECTOMY  12/17/2023   Procedure: POLYPECTOMY, INTESTINE;  Surgeon: Maryruth Ole DASEN, MD;  Location: [facility];  Service: Endoscopy;;   REPAIR ANKLE LIGAMENT Right 1978   REPAIR KNEE LIGAMENT Bilateral    acl   TRIGGER FINGER RELEASE Bilateral    thumbs   Patient Active Problem List   Diagnosis Date Noted   S/P lumbar fusion 07/29/2024   Pars defect with spondylolisthesis  07/29/2024   Atrophy of muscle of both lower legs 07/29/2024   Lumbar radiculopathy 07/29/2024   Cubital tunnel syndrome on right 02/16/2024   Unintentional weight loss 02/12/2024   Lacunar stroke (HCC) 12/12/2023   Essential tremor 12/12/2023   Dizziness 10/30/2023   Postoperative hematoma of musculoskeletal structure following musculoskeletal procedure 08/01/2022   Cervical myelopathy (HCC) 07/30/2022   Cervical spinal stenosis 07/30/2022   Radiculopathy, cervical 07/30/2022   S/P cervical spinal fusion 07/30/2022   Vitamin D  deficiency 06/25/2022   B12  deficiency 06/25/2022   Elevated LDL cholesterol level 06/25/2022   Prediabetes 03/27/2021   History of nausea- reported 01/27/2020 02/02/2020   Accidental fall from ladder 02/02/2020   Acute right-sided thoracic back pain 02/02/2020   Neck stiffness 02/02/2020   Concussion with no loss of consciousness 02/02/2020   Multiple falls 02/02/2020   Head trauma, initial encounter 02/02/2020   Acute post-traumatic headache, not intractable 02/02/2020   Essential hypertension 08/26/2018   Migraine with vertigo 01/05/2014   Arthralgia of multiple joints 04/28/2009   Carpal tunnel syndrome 08/30/2008    ONSET DATE: DOS: 07/29/24  PSF/ALIF L5-S1   REFERRING DIAG: F37.438,F37.437 (ICD-10-CM) - Atrophy of muscle of both lower legs   THERAPY DIAG:  Muscle weakness (generalized)  Other lack of coordination  Difficulty in walking, not elsewhere classified  Unsteadiness on feet  Rationale for Evaluation and Treatment: Rehabilitation  SUBJECTIVE:                                                                                                                                                                                             SUBJECTIVE STATEMENT: Patient presents with daughter, has questions about crutches    Pt accompanied by: significant other  PERTINENT HISTORY: DOS: 07/29/24  PSF/ALIF L5-S1 . Patient reports having multiple areas of stenosis along with atrophy in the lower extremities with minimal innervation to the lower extremities. He reports falling quite frequently, especially backwards. Pt has complicated injury in the past resulting in leg length discrepancy. He reports he can only bend the L knee 45 degrees. He reports the R LE is longer than the L. Prior to the surgery the patient was walking with a SPC and occasionally crutches. Patient has tried a rollator in the past but did not like it due to feeling like the COG is further away from him. Patient did have HHPT and was  discharged from them 08/19/2025.   PAIN:  Are you having pain? No  PRECAUTIONS: None  RED FLAGS: None   WEIGHT BEARING RESTRICTIONS: No  FALLS: Has patient fallen in last 6 months? Yes. Number of  falls 5  LIVING ENVIRONMENT: Lives with: lives with their family and lives with their spouse Lives in: House/apartment Stairs: Yes: External: 3-4 steps; can reach both Has following equipment at home: Single point cane, Walker - 2 wheeled, Environmental Consultant - 4 wheeled, and Crutches  PLOF: Independent with community mobility with device  PATIENT GOALS: Improved gait, improved cores stability, improved safety with gait, improved balance.   OBJECTIVE:  Note: Objective measures were completed at Evaluation unless otherwise noted.  DIAGNOSTIC FINDINGS:  EXAM: LUMBAR SPINE - 2-3 VIEW   COMPARISON:  Lumbar spine MRI dated 02/29/2024.   FINDINGS: Four C-arm of the lumbosacral region demonstrate interbody and screw fusion at the L5-S1 level with improved anterolisthesis at that level, currently grade 1.   IMPRESSION: L5-S1 fusion with improved anterolisthesis.     Electronically Signed   By: Elspeth Bathe M.D.   On: 07/29/2024 13:27  COGNITION: Overall cognitive status: Within functional limits for tasks assessed   SENSATION: WFL     POSTURE: No Significant postural limitations  LOWER EXTREMITY ROM:     L knee flexion contracture.   LOWER EXTREMITY MMT:    MMT Right Eval Left Eval  Hip flexion 4 4-  Hip extension    Hip abduction 4 4-  Hip adduction 4+ 4+  Hip internal rotation    Hip external rotation    Knee flexion 4- 4-  Knee extension 4+ 5  Ankle dorsiflexion 3+ 3-  Ankle plantarflexion 3 3  Ankle inversion    Ankle eversion    (Blank rows = not tested)   TRANSFERS: Independent without use of hands.    STAIRS: Not tested GAIT: Findings: Shuffling gait with decreased step length bilaterally.   FUNCTIONAL TESTS:  5 times sit to stand: 8.45 Timed up  and go (TUG): 12.5 6 minute walk test: 600' 10 meter walk test: DNT  PATIENT SURVEYS:   ABC scale: To be tested at initial treatment.                                                                                                                              TREATMENT DATE: 09/17/24 10 Meter Walk Test: Patient instructed to walk 10 meters (32.8 ft) as quickly and as safely as possible at their normal speed x2 and at a fast speed x2. Time measured from 2 meter mark to 8 meter mark to accommodate ramp-up and ramp-down.  Normal speed 1: 25 seconds with RW   Average Fast speed: 9 seconds m/s Cut off scores: <0.4 m/s = household Ambulator, 0.4-0.8 m/s = limited community Ambulator, >0.8 m/s = community Ambulator, >1.2 m/s = crossing a street, <1.0 = increased fall risk MCID 0.05 m/s (small), 0.13 m/s (moderate), 0.06 m/s (significant)  (ANPTA Core Set of Outcome Measures for Adults with Neurologic Conditions, 2018)  Education on safe AD: -Safety with AD along with proper AD at this time.  -Proper mechanics following no BLT when performing certain  tasks such as supine to sit transfer and transferring in/out of vehicle. -education on need for log rolling -education on breathing techniques    TA- To improve functional movements patterns for everyday tasks  TrA activation hold 5 seconds x 10 TrA with march 10x each LE  TrA with green ball adduction  Log rolling education and performance SAQ 15x each LE x2 sets Glute squeeze 15x 5 seconds Modified dead bug 10x each side   Seated scapular squeezes 10x   PATIENT EDUCATION: Education details: Safety with AD.  Person educated: Patient and Spouse Education method: Explanation Education comprehension: verbalized understanding  HOME EXERCISE PROGRAM: To be given at initial treatment session.   Access Code: [email or web address] URL: [email or web address] Date: 09/17/2024 Prepared by: Yamari Ventola  Exercises - Supine Transversus  Abdominis Bracing - Hands on Stomach  - 1 x daily - 7 x weekly - 2 sets - 10 reps - 5 hold - Beginner Knee Fold  - 1 x daily - 7 x weekly - 2 sets - 10 reps - 5 hold - Supine Hip Adduction Isometric with Ball  - 1 x daily - 7 x weekly - 2 sets - 10 reps - 5 hold  GOALS: Goals reviewed with patient? Yes  SHORT TERM GOALS: Target date: 09/24/2024  Pt will be independent with HEP in 2 weeks allowing him to perform HEP at home safely and efficiently with proper mechanics.  Baseline: HEP to be initiated at first treatment. Goal status: INITIAL   LONG TERM GOALS: Target date: 12/03/2024     1.  Patient will reduce timed up and go to <11 seconds to reduce fall risk and demonstrate improved transfer/gait ability. Baseline: 12.5 Goal status: INITIAL  2.  Patient will increase 10 meter walk test to >1.90m/s as to improve gait speed for better community ambulation and to reduce fall risk. Baseline: Test at initial treatment. 1/29: 1.1 seconds with RW  Goal status: INITIAL  3.  Patient will increase six minute walk test distance to >1000 for progression to community ambulator and improve gait ability Baseline: 600 Goal status: INITIAL  4.  Patient will increase ABC score by 15% to demonstrate decreased fall risk during functional activities. Baseline: Test at initial treatment.  Goal status: INITIAL  5. Patient will demonstrate ability to ambulate at least 250' independently and safely with least restrictive device by discharge allowing him to [place] community distances with more efficiency/safety.  BASELINE: Pt required CGA using FWW at evaluation.  Goal Status: INITIAL.   ASSESSMENT:  CLINICAL IMPRESSION:  Continued education on not using crutches due to safety, extensive education required, undetermined if patient agrees. Extensive education on log rolling and performance of log rolling to decrease patient sit up technique. Extensive core stabilization tolerated well throughout  session.  Overall, patient is a good candidate for skilled PT to improve on deficits listed above allowing him to perform gait and functional activities with less difficulty and decreased risk for falls. Pt will benefit from skilled PT 2x/week to improve toward goals established.   OBJECTIVE IMPAIRMENTS: Abnormal gait, decreased activity tolerance, decreased balance, decreased coordination, decreased endurance, decreased knowledge of use of DME, decreased mobility, difficulty walking, decreased ROM, decreased strength, and decreased safety awareness.   ACTIVITY LIMITATIONS: carrying, lifting, bending, sitting, standing, squatting, stairs, and transfers  PARTICIPATION LIMITATIONS: meal prep, cleaning, laundry, driving, shopping, community activity, and yard work  PERSONAL FACTORS: Behavior pattern, Education, Past/current experiences, and 3+ comorbidities: Prediabetes, elevated LDL cholesterol, Lacunar Stroke are also affecting  patient's functional outcome.   REHAB POTENTIAL: Good  CLINICAL DECISION MAKING: Evolving/moderate complexity  EVALUATION COMPLEXITY: Moderate  PLAN:  PT FREQUENCY: 2x/week  PT DURATION: 12 weeks  PLANNED INTERVENTIONS: [id number]- PT Re-evaluation, 97750- Physical Performance Testing, 97110-Therapeutic exercises, 97530- Therapeutic activity, 97112- Neuromuscular re-education, 97535- Self Care, [id number]- Manual therapy, [phone]- Gait training, Patient/Family education, Balance training, Stair training, and Vestibular training  PLAN FOR NEXT SESSION:  Progressive strengthening:  Possibly perform ABC scale or LEFS  Rhyann Berton, PT 09/17/2024, 2:44 PM        "

## 2024-09-17 ENCOUNTER — Ambulatory Visit

## 2024-09-17 DIAGNOSIS — R262 Difficulty in walking, not elsewhere classified: Secondary | ICD-10-CM

## 2024-09-17 DIAGNOSIS — M6281 Muscle weakness (generalized): Secondary | ICD-10-CM

## 2024-09-17 DIAGNOSIS — R2681 Unsteadiness on feet: Secondary | ICD-10-CM

## 2024-09-17 DIAGNOSIS — R278 Other lack of coordination: Secondary | ICD-10-CM

## 2024-09-22 ENCOUNTER — Ambulatory Visit

## 2024-09-22 DIAGNOSIS — M6281 Muscle weakness (generalized): Secondary | ICD-10-CM

## 2024-09-22 DIAGNOSIS — R2681 Unsteadiness on feet: Secondary | ICD-10-CM

## 2024-09-22 DIAGNOSIS — R262 Difficulty in walking, not elsewhere classified: Secondary | ICD-10-CM

## 2024-09-24 ENCOUNTER — Ambulatory Visit

## 2024-09-24 DIAGNOSIS — R262 Difficulty in walking, not elsewhere classified: Secondary | ICD-10-CM

## 2024-09-24 DIAGNOSIS — R2681 Unsteadiness on feet: Secondary | ICD-10-CM

## 2024-09-24 DIAGNOSIS — M6281 Muscle weakness (generalized): Secondary | ICD-10-CM

## 2024-09-24 NOTE — Therapy (Signed)
 " OUTPATIENT PHYSICAL THERAPY NEURO TREATMENT   Patient Name: [name] MRN: [id number] DOB:Dec 13, 1960, 64 y.o., male Today's Date: 09/24/2024   PCP: Melvin Pao, NP  REFERRING PROVIDER: Hilma Hastings, PA-C   END OF SESSION:  PT End of Session - 09/24/24 1354     Visit Number 4    Date for Recertification  12/03/24    PT Start Time 1400    PT Stop Time 1444    PT Time Calculation (min) 44 min    Equipment Utilized During Treatment Gait belt    Activity Tolerance Patient tolerated treatment well             Past Medical History:  Diagnosis Date   ADHD    Anxiety    a.) on BZO (lorazepam ) PRN   Arthritis    Concussion    COVID 12/2020   GERD (gastroesophageal reflux disease)    Hypertension    Long-term use of aspirin  therapy    Multiple lacunar infarcts (HCC)    MVP (mitral valve prolapse)    Pernicious anemia    [place] spotted fever    Sleep apnea    Spinal stenosis    Stroke (HCC)    Vestibular migraine    Vitamin B12 deficiency    Vitamin D  deficiency    Past Surgical History:  Procedure Laterality Date   ABDOMINAL EXPOSURE N/A 07/29/2024   Procedure: ABDOMINAL EXPOSURE;  Surgeon: Marea Selinda RAMAN, MD;  Location: [facility];  Service: Vascular;  Laterality: N/A;   ANTERIOR AND POSTERIOR SPINAL FUSION N/A 07/29/2024   Procedure: ANTERIOR AND POSTERIOR SPINAL FUSION;  Surgeon: Clois Fret, MD;  Location: [facility];  Service: Neurosurgery;  Laterality: N/A;  L5-S1 ANTERIOR LUMBAR INTERBODY FUSION AND POSTERIOR SPINAL FUSION   ANTERIOR CERVICAL DECOMP/DISCECTOMY FUSION N/A 07/30/2022   Procedure: C4-7 ANTERIOR CERVICAL DISCECTOMY AND FUSION (GLOBUS HEDRON);  Surgeon: Clois Fret, MD;  Location: [facility];  Service: Neurosurgery;  Laterality: N/A;   APPLICATION OF CELL SAVER N/A 07/29/2024   Procedure: APPLICATION OF CELL SAVER;  Surgeon: Clois Fret, MD;  Location: [facility];  Service: Neurosurgery;  Laterality: N/A;    APPLICATION OF INTRAOPERATIVE CT SCAN N/A 07/29/2024   Procedure: APPLICATION OF INTRAOPERATIVE CT SCAN;  Surgeon: Clois Fret, MD;  Location: [facility];  Service: Neurosurgery;  Laterality: N/A;   BILATERAL CARPAL TUNNEL RELEASE     CERVICAL WOUND DEBRIDEMENT N/A 08/01/2022   Procedure: CERVICAL WOUND DEBRIDEMENT OF HEMATOMA;  Surgeon: Clois Fret, MD;  Location: [facility];  Service: Neurosurgery;  Laterality: N/A;   COLONOSCOPY     COLONOSCOPY WITH PROPOFOL  N/A 12/17/2023   Procedure: COLONOSCOPY WITH PROPOFOL ;  Surgeon: Maryruth Ole DASEN, MD;  Location: [facility];  Service: Endoscopy;  Laterality: N/A;   ESOPHAGOGASTRODUODENOSCOPY (EGD) WITH PROPOFOL  N/A 12/17/2023   Procedure: ESOPHAGOGASTRODUODENOSCOPY (EGD) WITH PROPOFOL ;  Surgeon: Maryruth Ole DASEN, MD;  Location: [facility];  Service: Endoscopy;  Laterality: N/A;   FRACTURE SURGERY Left 1984   intramedullary rod   hip bone transplant  1984   LEG SURGERY Left    oseotomy   POLYPECTOMY  12/17/2023   Procedure: POLYPECTOMY, INTESTINE;  Surgeon: Maryruth Ole DASEN, MD;  Location: [facility];  Service: Endoscopy;;   REPAIR ANKLE LIGAMENT Right 1978   REPAIR KNEE LIGAMENT Bilateral    acl   TRIGGER FINGER RELEASE Bilateral    thumbs   Patient Active Problem List   Diagnosis Date Noted   S/P lumbar fusion 07/29/2024   Pars defect  with spondylolisthesis 07/29/2024   Atrophy of muscle of both lower legs 07/29/2024   Lumbar radiculopathy 07/29/2024   Cubital tunnel syndrome on right 02/16/2024   Unintentional weight loss 02/12/2024   Lacunar stroke (HCC) 12/12/2023   Essential tremor 12/12/2023   Dizziness 10/30/2023   Postoperative hematoma of musculoskeletal structure following musculoskeletal procedure 08/01/2022   Cervical myelopathy (HCC) 07/30/2022   Cervical spinal stenosis 07/30/2022   Radiculopathy, cervical 07/30/2022   S/P cervical spinal fusion 07/30/2022   Vitamin D  deficiency 06/25/2022    B12 deficiency 06/25/2022   Elevated LDL cholesterol level 06/25/2022   Prediabetes 03/27/2021   History of nausea- reported 01/27/2020 02/02/2020   Accidental fall from ladder 02/02/2020   Acute right-sided thoracic back pain 02/02/2020   Neck stiffness 02/02/2020   Concussion with no loss of consciousness 02/02/2020   Multiple falls 02/02/2020   Head trauma, initial encounter 02/02/2020   Acute post-traumatic headache, not intractable 02/02/2020   Essential hypertension 08/26/2018   Migraine with vertigo 01/05/2014   Arthralgia of multiple joints 04/28/2009   Carpal tunnel syndrome 08/30/2008    ONSET DATE: DOS: 07/29/24  PSF/ALIF L5-S1   REFERRING DIAG: F37.438,F37.437 (ICD-10-CM) - Atrophy of muscle of both lower legs   THERAPY DIAG:  Muscle weakness (generalized)  Difficulty in walking, not elsewhere classified  Unsteadiness on feet  Rationale for Evaluation and Treatment: Rehabilitation  SUBJECTIVE:                                                                                                                                                                                             SUBJECTIVE STATEMENT: Patient tied his chair to turn off the breaks. Patient reports exhaustion from previous session.   Pt accompanied by: significant other  PERTINENT HISTORY: DOS: 07/29/24  PSF/ALIF L5-S1 . Patient reports having multiple areas of stenosis along with atrophy in the lower extremities with minimal innervation to the lower extremities. He reports falling quite frequently, especially backwards. Pt has complicated injury in the past resulting in leg length discrepancy. He reports he can only bend the L knee 45 degrees. He reports the R LE is longer than the L. Prior to the surgery the patient was walking with a SPC and occasionally crutches. Patient has tried a rollator in the past but did not like it due to feeling like the COG is further away from him. Patient did have HHPT and  was discharged from them 08/19/2025.   PAIN:  Are you having pain? No  PRECAUTIONS: None  RED FLAGS: None   WEIGHT BEARING RESTRICTIONS: No  FALLS: Has patient fallen in last 6 months?  Yes. Number of falls 5  LIVING ENVIRONMENT: Lives with: lives with their family and lives with their spouse Lives in: House/apartment Stairs: Yes: External: 3-4 steps; can reach both Has following equipment at home: Single point cane, Walker - 2 wheeled, Environmental Consultant - 4 wheeled, and Crutches  PLOF: Independent with community mobility with device  PATIENT GOALS: Improved gait, improved cores stability, improved safety with gait, improved balance.   OBJECTIVE:  Note: Objective measures were completed at Evaluation unless otherwise noted.  DIAGNOSTIC FINDINGS:  EXAM: LUMBAR SPINE - 2-3 VIEW   COMPARISON:  Lumbar spine MRI dated 02/29/2024.   FINDINGS: Four C-arm of the lumbosacral region demonstrate interbody and screw fusion at the L5-S1 level with improved anterolisthesis at that level, currently grade 1.   IMPRESSION: L5-S1 fusion with improved anterolisthesis.     Electronically Signed   By: Elspeth Bathe M.D.   On: 07/29/2024 13:27  COGNITION: Overall cognitive status: Within functional limits for tasks assessed   SENSATION: WFL     POSTURE: No Significant postural limitations  LOWER EXTREMITY ROM:     L knee flexion contracture.   LOWER EXTREMITY MMT:    MMT Right Eval Left Eval  Hip flexion 4 4-  Hip extension    Hip abduction 4 4-  Hip adduction 4+ 4+  Hip internal rotation    Hip external rotation    Knee flexion 4- 4-  Knee extension 4+ 5  Ankle dorsiflexion 3+ 3-  Ankle plantarflexion 3 3  Ankle inversion    Ankle eversion    (Blank rows = not tested)   TRANSFERS: Independent without use of hands.    STAIRS: Not tested GAIT: Findings: Shuffling gait with decreased step length bilaterally.   FUNCTIONAL TESTS:  5 times sit to stand: 8.45 Timed  up and go (TUG): 12.5 6 minute walk test: 600' 10 meter walk test: DNT  PATIENT SURVEYS:   ABC scale: To be tested at initial treatment.                                                                                                                              TREATMENT DATE: 09/24/24   Education on safe AD: -Safety with RW along with proper use of RW at this time.  -education on need to have chair locked when transferring.     TA- To improve functional movements patterns for everyday tasks  TrA activation hold 5 seconds x 10 TrA with march 10x each LE ; alternating  TrA with large swiss ball 10x hold 5 seconds TrA with UE raises 10x with large swiss ball  SAQ 15x each LE  with 2lb AW  Glute squeeze 15x 5 seconds   Seated scapular squeezes 10x Seated ER for upright posture 20x  Dynadisc: 60 second sit on pad for stabilization  Seated 2lb AW 15x LAQ each LE Seated 2lb march no back support 10x each LE   Standing:  Sit to  stand with focus on core and gluteal activation 5x  Standing march with core activation 10x  Standing extension with core activation 10x each LE   Ambulate 113ft with RW and cues for core activation ; cues for keeping step length longer. Cues for keeping walker on ground, not picking up walker. ; x 2 trials  PATIENT EDUCATION: Education details: Safety with AD.  Person educated: Patient and Spouse Education method: Explanation Education comprehension: verbalized understanding  HOME EXERCISE PROGRAM: To be given at initial treatment session.   Access Code: [email or web address] URL: [email or web address] Date: 09/17/2024 Prepared by: Lindsey Hommel  Exercises - Supine Transversus Abdominis Bracing - Hands on Stomach  - 1 x daily - 7 x weekly - 2 sets - 10 reps - 5 hold - Beginner Knee Fold  - 1 x daily - 7 x weekly - 2 sets - 10 reps - 5 hold - Supine Hip Adduction Isometric with Ball  - 1 x daily - 7 x weekly - 2 sets - 10 reps - 5  hold  GOALS: Goals reviewed with patient? Yes  SHORT TERM GOALS: Target date: 09/24/2024  Pt will be independent with HEP in 2 weeks allowing him to perform HEP at home safely and efficiently with proper mechanics.  Baseline: HEP to be initiated at first treatment. Goal status: INITIAL   LONG TERM GOALS: Target date: 12/03/2024     1.  Patient will reduce timed up and go to <11 seconds to reduce fall risk and demonstrate improved transfer/gait ability. Baseline: 12.5 Goal status: INITIAL  2.  Patient will increase 10 meter walk test to >1.6m/s as to improve gait speed for better community ambulation and to reduce fall risk. Baseline: Test at initial treatment. 1/29: 1.1 seconds with RW  Goal status: INITIAL  3.  Patient will increase six minute walk test distance to >1000 for progression to community ambulator and improve gait ability Baseline: 600 Goal status: INITIAL  4.  Patient will increase ABC score by 15% to demonstrate decreased fall risk during functional activities. Baseline: Test at initial treatment.  Goal status: INITIAL  5. Patient will demonstrate ability to ambulate at least 250' independently and safely with least restrictive device by discharge allowing him to [place] community distances with more efficiency/safety.  BASELINE: Pt required CGA using FWW at evaluation.  Goal Status: INITIAL.   ASSESSMENT:  CLINICAL IMPRESSION: Patient educated on need to keep chair locked when transferring into/out of chair. Patient reports fatigue with attending therapy due to demands at home such as shower and need to get a ride from his wife. Education on ambulation with a walker and step length performed today with patient taking shorter steps as he fatigues with increased trunk flexion, improves with verbal cueing. At end of session patient brought to waiting room and told to wait for the volunteer to bring him upstairs to the car. Patient agreed. Upon returning to PT gym,  PT notified by front desk staff patient is attempting to wheel himself upstairs, PT ran to meet patient in hallway, re-educated on why it is not safe to do, patient argued, brought upstairs by PT.  Patient requires cueing for safety and keeping walker on ground.  Pt will benefit from skilled PT 2x/week to improve toward goals established.   OBJECTIVE IMPAIRMENTS: Abnormal gait, decreased activity tolerance, decreased balance, decreased coordination, decreased endurance, decreased knowledge of use of DME, decreased mobility, difficulty walking, decreased ROM, decreased strength, and decreased safety awareness.   ACTIVITY LIMITATIONS: carrying, lifting,  bending, sitting, standing, squatting, stairs, and transfers  PARTICIPATION LIMITATIONS: meal prep, cleaning, laundry, driving, shopping, community activity, and yard work  PERSONAL FACTORS: Behavior pattern, Education, Past/current experiences, and 3+ comorbidities: Prediabetes, elevated LDL cholesterol, Lacunar Stroke are also affecting patient's functional outcome.   REHAB POTENTIAL: Good  CLINICAL DECISION MAKING: Evolving/moderate complexity  EVALUATION COMPLEXITY: Moderate  PLAN:  PT FREQUENCY: 2x/week  PT DURATION: 12 weeks  PLANNED INTERVENTIONS: [id number]- PT Re-evaluation, 97750- Physical Performance Testing, 97110-Therapeutic exercises, 97530- Therapeutic activity, [id number]- Neuromuscular re-education, 97535- Self Care, [id number]- Manual therapy, [phone]- Gait training, Patient/Family education, Balance training, Stair training, and Vestibular training  PLAN FOR NEXT SESSION:  Progressive strengthening:   Markise Haymer, PT 09/24/2024, 2:51 PM        "

## 2024-09-29 ENCOUNTER — Ambulatory Visit

## 2024-10-01 ENCOUNTER — Ambulatory Visit

## 2024-10-08 ENCOUNTER — Ambulatory Visit

## 2024-10-12 ENCOUNTER — Ambulatory Visit: Admitting: Neurosurgery

## 2024-10-13 ENCOUNTER — Ambulatory Visit: Admitting: Nurse Practitioner

## 2024-10-15 ENCOUNTER — Ambulatory Visit

## 2024-10-19 ENCOUNTER — Other Ambulatory Visit

## 2024-10-19 ENCOUNTER — Encounter: Admitting: Orthopedic Surgery

## 2024-10-22 ENCOUNTER — Ambulatory Visit

## 2024-10-27 ENCOUNTER — Ambulatory Visit: Admitting: Physical Therapy

## 2024-10-29 ENCOUNTER — Ambulatory Visit

## 2024-11-03 ENCOUNTER — Ambulatory Visit

## 2024-11-04 ENCOUNTER — Ambulatory Visit: Admitting: Internal Medicine

## 2024-11-05 ENCOUNTER — Ambulatory Visit

## 2024-11-10 ENCOUNTER — Ambulatory Visit

## 2024-11-12 ENCOUNTER — Ambulatory Visit

## 2024-11-17 ENCOUNTER — Ambulatory Visit

## 2024-11-19 ENCOUNTER — Ambulatory Visit

## 2024-11-24 ENCOUNTER — Ambulatory Visit

## 2024-11-26 ENCOUNTER — Ambulatory Visit

## 2024-11-30 ENCOUNTER — Encounter: Payer: Self-pay | Admitting: Psychology

## 2024-12-01 ENCOUNTER — Ambulatory Visit

## 2024-12-03 ENCOUNTER — Ambulatory Visit

## 2024-12-07 ENCOUNTER — Ambulatory Visit

## 2024-12-08 ENCOUNTER — Ambulatory Visit

## 2024-12-10 ENCOUNTER — Ambulatory Visit

## 2024-12-15 ENCOUNTER — Ambulatory Visit

## 2024-12-17 ENCOUNTER — Ambulatory Visit

## 2024-12-22 ENCOUNTER — Ambulatory Visit

## 2024-12-24 ENCOUNTER — Ambulatory Visit

## 2024-12-29 ENCOUNTER — Ambulatory Visit

## 2024-12-31 ENCOUNTER — Ambulatory Visit

## 2025-01-05 ENCOUNTER — Ambulatory Visit

## 2025-01-07 ENCOUNTER — Ambulatory Visit
